# Patient Record
Sex: Male | Born: 1945 | Race: White | Hispanic: No | Smoking: Former smoker
Health system: Southern US, Community
[De-identification: ages and names within clinical notes are randomized; demographics above are authoritative.]

## PROBLEM LIST (undated history)

## (undated) DIAGNOSIS — I10 Essential (primary) hypertension: Secondary | ICD-10-CM

## (undated) DIAGNOSIS — E7800 Pure hypercholesterolemia, unspecified: Secondary | ICD-10-CM

## (undated) DIAGNOSIS — J449 Chronic obstructive pulmonary disease, unspecified: Secondary | ICD-10-CM

## (undated) HISTORY — DX: Chronic obstructive pulmonary disease, unspecified: J44.9

---

## 2005-01-08 HISTORY — PX: NASAL SINUS SURGERY: SHX719

## 2011-06-19 ENCOUNTER — Encounter: Payer: Self-pay | Admitting: Internal Medicine

## 2011-08-16 ENCOUNTER — Ambulatory Visit (AMBULATORY_SURGERY_CENTER): Payer: Medicare HMO | Admitting: *Deleted

## 2011-08-16 ENCOUNTER — Telehealth: Payer: Self-pay | Admitting: *Deleted

## 2011-08-16 VITALS — Ht 71.0 in | Wt 212.0 lb

## 2011-08-16 DIAGNOSIS — Z1211 Encounter for screening for malignant neoplasm of colon: Secondary | ICD-10-CM

## 2011-08-16 MED ORDER — MOVIPREP 100 G PO SOLR: ORAL | Status: DC

## 2011-08-16 NOTE — Telephone Encounter (Signed)
Pt had a colon with polyp 3 1/2 years ago at [facility].  Medical Release given to Dottie Smith CMA.  His colon is scheduled for 08/30/11. 

## 2011-08-16 NOTE — Telephone Encounter (Signed)
Noted  

## 2011-08-16 NOTE — Progress Notes (Signed)
Pt had a colon with polyp 3 1/2 years ago at [facility].  Medical Release given to Ronny Bacon CMA.  His colon is scheduled for 08/30/11.

## 2011-08-27 ENCOUNTER — Telehealth: Payer: Self-pay | Admitting: Internal Medicine

## 2011-08-27 NOTE — Telephone Encounter (Signed)
Advised patient that I have not yet received any records from [facility].

## 2011-08-30 ENCOUNTER — Encounter: Payer: Self-pay | Admitting: Internal Medicine

## 2011-08-30 ENCOUNTER — Ambulatory Visit (AMBULATORY_SURGERY_CENTER): Payer: Medicare HMO | Admitting: Internal Medicine

## 2011-08-30 VITALS — BP 141/65 | HR 76 | Temp 96.2°F | Resp 16 | Ht 71.0 in | Wt 212.0 lb

## 2011-08-30 DIAGNOSIS — Z860100 Personal history of colon polyps, unspecified: Secondary | ICD-10-CM

## 2011-08-30 DIAGNOSIS — Z1211 Encounter for screening for malignant neoplasm of colon: Secondary | ICD-10-CM

## 2011-08-30 MED ORDER — SODIUM CHLORIDE 0.9 % IV SOLN: 500.0000 mL | INTRAVENOUS | Status: DC

## 2011-08-30 NOTE — Op Note (Signed)
[facility] [address] [place], [address]   COLONOSCOPY PROCEDURE REPORT  PATIENT: [name], [name] Maziarz.  MR#: [id number] BIRTHDATE: April 18, 1945 , 65  yrs. old GENDER: Male ENDOSCOPIST: Hart Carwin, MD REFERRED BY:  [name], M.D. PROCEDURE DATE:  08/30/2011 PROCEDURE:   Colonoscopy, surveillance ASA CLASS:   Class II INDICATIONS:average risk patient for colon cancer and 2 polyps removed 2009. MEDICATIONS: MAC sedation, administered by CRNA and Propofol (Diprivan) 230 mg IV  DESCRIPTION OF PROCEDURE:   After the risks and benefits and of the procedure were explained, informed consent was obtained.  A digital rectal exam revealed no abnormalities of the rectum.    The LB [id number]  endoscope was introduced through the anus and advanced to the cecum, which was identified by both the appendix and ileocecal valve .  The quality of the prep was good, using MoviPrep .  The instrument was then slowly withdrawn as the colon was fully examined.     COLON FINDINGS: A normal appearing cecum, ileocecal valve, and appendiceal orifice were identified.  The ascending, hepatic flexure, transverse, splenic flexure, descending, sigmoid colon and rectum appeared unremarkable.  No polyps or cancers were seen. Retroflexed views revealed no abnormalities.     The scope was then withdrawn from the patient and the procedure completed.  COMPLICATIONS: There were no complications. ENDOSCOPIC IMPRESSION: Normal colon , no recurrent polyps  RECOMMENDATIONS: High fiber diet   REPEAT EXAM: In 10 year(s)  for Colonoscopy.  cc:  _______________________________ eSignedHart Carwin, MD 08/30/2011 9:54 AM

## 2011-08-30 NOTE — Progress Notes (Signed)
Patient did not experience any of the following events: a burn prior to discharge; a fall within the facility; wrong site/side/patient/procedure/implant event; or a hospital transfer or hospital admission upon discharge from the facility. (G8907) Patient did not have preoperative order for IV antibiotic SSI prophylaxis. (G8918)  

## 2011-08-30 NOTE — Patient Instructions (Addendum)
YOU HAD AN ENDOSCOPIC PROCEDURE TODAY AT THE [facility]: Refer to the procedure report that was given to you for any specific questions about what was found during the examination.  If the procedure report does not answer your questions, please call your gastroenterologist to clarify.  If you requested that your care partner not be given the details of your procedure findings, then the procedure report has been included in a sealed envelope for you to review at your convenience later.  YOU SHOULD EXPECT: Some feelings of bloating in the abdomen. Passage of more gas than usual.  Walking can help get rid of the air that was put into your GI tract during the procedure and reduce the bloating. If you had a lower endoscopy (such as a colonoscopy or flexible sigmoidoscopy) you may notice spotting of blood in your stool or on the toilet paper. If you underwent a bowel prep for your procedure, then you may not have a normal bowel movement for a few days.  DIET: Your first meal following the procedure should be a light meal and then it is ok to progress to your normal diet.  A half-sandwich or bowl of soup is an example of a good first meal.  Heavy or fried foods are harder to digest and may make you feel nauseous or bloated.  Likewise meals heavy in dairy and vegetables can cause extra gas to form and this can also increase the bloating.  Drink plenty of fluids but you should avoid alcoholic beverages for 24 hours.  ACTIVITY: Your care partner should take you home directly after the procedure.  You should plan to take it easy, moving slowly for the rest of the day.  You can resume normal activity the day after the procedure however you should NOT DRIVE or use heavy machinery for 24 hours (because of the sedation medicines used during the test).    SYMPTOMS TO REPORT IMMEDIATELY: A gastroenterologist can be reached at any hour.  During normal business hours, 8:30 AM to 5:00 PM Monday through Friday,  call [phone].  After hours and on weekends, please call the GI answering service at [phone] who will take a message and have the physician on call contact you.   Following lower endoscopy (colonoscopy or flexible sigmoidoscopy):  Excessive amounts of blood in the stool  Significant tenderness or worsening of abdominal pains  Swelling of the abdomen that is new, acute  Fever of 100F or higher  FOLLOW UP: Our staff will call the home number listed on your records the next business day following your procedure to check on you and address any questions or concerns that you may have at that time regarding the information given to you following your procedure. This is a courtesy call and so if there is no answer at the home number and we have not heard from you through the emergency physician on call, we will assume that you have returned to your regular daily activities without incident.  SIGNATURES/CONFIDENTIALITY: You and/or your care partner have signed paperwork which will be entered into your electronic medical record.  These signatures attest to the fact that that the information above on your After Visit Summary has been reviewed and is understood.  Full responsibility of the confidentiality of this discharge information lies with you and/or your care-partner.   Follow a high fiber diet- see handout  Follow up colonoscopy in 10 years

## 2011-08-31 ENCOUNTER — Telehealth: Payer: Self-pay

## 2011-08-31 NOTE — Telephone Encounter (Signed)
Unable to leave message number disconnected. 

## 2012-01-10 ENCOUNTER — Ambulatory Visit
Admission: RE | Admit: 2012-01-10 | Discharge: 2012-01-10 | Disposition: A | Discharge status: Home / Self Care | Payer: Medicare HMO | Source: Ambulatory Visit | Attending: Family Medicine | Admitting: Family Medicine

## 2012-01-10 ENCOUNTER — Other Ambulatory Visit: Payer: Self-pay | Admitting: Family Medicine

## 2012-01-10 DIAGNOSIS — J189 Pneumonia, unspecified organism: Secondary | ICD-10-CM

## 2012-09-15 ENCOUNTER — Other Ambulatory Visit: Payer: Self-pay | Admitting: Family Medicine

## 2012-09-15 DIAGNOSIS — R0989 Other specified symptoms and signs involving the circulatory and respiratory systems: Secondary | ICD-10-CM

## 2012-09-19 ENCOUNTER — Ambulatory Visit
Admission: RE | Admit: 2012-09-19 | Discharge: 2012-09-19 | Disposition: A | Discharge status: Home / Self Care | Payer: Medicare Other | Source: Ambulatory Visit | Attending: Family Medicine | Admitting: Family Medicine

## 2012-09-19 DIAGNOSIS — R0989 Other specified symptoms and signs involving the circulatory and respiratory systems: Secondary | ICD-10-CM

## 2012-10-07 ENCOUNTER — Ambulatory Visit (INDEPENDENT_AMBULATORY_CARE_PROVIDER_SITE_OTHER): Payer: Medicare Other | Admitting: Internal Medicine

## 2012-10-07 DIAGNOSIS — R0609 Other forms of dyspnea: Secondary | ICD-10-CM

## 2012-10-07 LAB — PULMONARY FUNCTION TEST

## 2012-10-07 NOTE — Progress Notes (Signed)
PFT done today. 

## 2012-12-17 ENCOUNTER — Other Ambulatory Visit (HOSPITAL_COMMUNITY): Payer: Self-pay | Admitting: Family Medicine

## 2012-12-17 ENCOUNTER — Ambulatory Visit (HOSPITAL_COMMUNITY): Payer: Medicare Other | Attending: Family Medicine | Admitting: Radiology

## 2012-12-17 ENCOUNTER — Encounter (INDEPENDENT_AMBULATORY_CARE_PROVIDER_SITE_OTHER): Payer: Self-pay

## 2012-12-17 ENCOUNTER — Other Ambulatory Visit (HOSPITAL_COMMUNITY): Payer: Self-pay | Admitting: Radiology

## 2012-12-17 DIAGNOSIS — J4489 Other specified chronic obstructive pulmonary disease: Secondary | ICD-10-CM | POA: Insufficient documentation

## 2012-12-17 DIAGNOSIS — J449 Chronic obstructive pulmonary disease, unspecified: Secondary | ICD-10-CM | POA: Insufficient documentation

## 2012-12-17 DIAGNOSIS — E669 Obesity, unspecified: Secondary | ICD-10-CM | POA: Insufficient documentation

## 2012-12-17 DIAGNOSIS — I059 Rheumatic mitral valve disease, unspecified: Secondary | ICD-10-CM | POA: Insufficient documentation

## 2012-12-17 DIAGNOSIS — Z87891 Personal history of nicotine dependence: Secondary | ICD-10-CM | POA: Insufficient documentation

## 2012-12-17 DIAGNOSIS — R0602 Shortness of breath: Secondary | ICD-10-CM

## 2012-12-17 DIAGNOSIS — R0989 Other specified symptoms and signs involving the circulatory and respiratory systems: Secondary | ICD-10-CM | POA: Insufficient documentation

## 2012-12-17 DIAGNOSIS — I359 Nonrheumatic aortic valve disorder, unspecified: Secondary | ICD-10-CM | POA: Insufficient documentation

## 2012-12-17 DIAGNOSIS — R0609 Other forms of dyspnea: Secondary | ICD-10-CM | POA: Insufficient documentation

## 2012-12-17 NOTE — Progress Notes (Signed)
Echocardiogram performed.  

## 2015-09-30 DIAGNOSIS — E7800 Pure hypercholesterolemia, unspecified: Secondary | ICD-10-CM | POA: Diagnosis not present

## 2015-09-30 DIAGNOSIS — Z0001 Encounter for general adult medical examination with abnormal findings: Secondary | ICD-10-CM | POA: Diagnosis not present

## 2015-09-30 DIAGNOSIS — J449 Chronic obstructive pulmonary disease, unspecified: Secondary | ICD-10-CM | POA: Diagnosis not present

## 2015-09-30 DIAGNOSIS — F17211 Nicotine dependence, cigarettes, in remission: Secondary | ICD-10-CM | POA: Diagnosis not present

## 2015-09-30 DIAGNOSIS — D229 Melanocytic nevi, unspecified: Secondary | ICD-10-CM | POA: Diagnosis not present

## 2015-09-30 DIAGNOSIS — Z125 Encounter for screening for malignant neoplasm of prostate: Secondary | ICD-10-CM | POA: Diagnosis not present

## 2015-09-30 DIAGNOSIS — Z79899 Other long term (current) drug therapy: Secondary | ICD-10-CM | POA: Diagnosis not present

## 2015-09-30 DIAGNOSIS — Z23 Encounter for immunization: Secondary | ICD-10-CM | POA: Diagnosis not present

## 2015-10-19 ENCOUNTER — Other Ambulatory Visit: Payer: Self-pay | Admitting: Acute Care

## 2015-10-19 DIAGNOSIS — Z87891 Personal history of nicotine dependence: Secondary | ICD-10-CM

## 2015-11-03 ENCOUNTER — Ambulatory Visit (INDEPENDENT_AMBULATORY_CARE_PROVIDER_SITE_OTHER): Payer: PPO | Admitting: Acute Care

## 2015-11-03 ENCOUNTER — Encounter: Payer: Self-pay | Admitting: Acute Care

## 2015-11-03 ENCOUNTER — Encounter (INDEPENDENT_AMBULATORY_CARE_PROVIDER_SITE_OTHER): Payer: Self-pay

## 2015-11-03 ENCOUNTER — Ambulatory Visit (INDEPENDENT_AMBULATORY_CARE_PROVIDER_SITE_OTHER)
Admission: RE | Admit: 2015-11-03 | Discharge: 2015-11-03 | Disposition: A | Discharge status: Home / Self Care | Payer: PPO | Source: Ambulatory Visit | Attending: Acute Care | Admitting: Acute Care

## 2015-11-03 DIAGNOSIS — Z87891 Personal history of nicotine dependence: Secondary | ICD-10-CM

## 2015-11-03 NOTE — Progress Notes (Signed)
Shared Decision Making Visit Lung Cancer Screening Program [phone])   Eligibility:  Age 70 y.o.  Pack Years Smoking History Calculation 80 pack year smoking history (# packs/per year x # years smoked)  Recent History of coughing up blood  no  Unexplained weight loss? no ( >Than 15 pounds within the last 6 months )  Prior History Lung / other cancer no (Diagnosis within the last 5 years already requiring surveillance chest CT Scans).  Smoking Status Former Smoker  Former Smokers: Years since quit: 7 years  Quit Date: 2010  Visit Components:  Discussion included one or more decision making aids. yes  Discussion included risk/benefits of screening. yes  Discussion included potential follow up diagnostic testing for abnormal scans. yes  Discussion included meaning and risk of over diagnosis. yes  Discussion included meaning and risk of False Positives. yes  Discussion included meaning of total radiation exposure. yes  Counseling Included:  Importance of adherence to annual lung cancer LDCT screening. yes  Impact of comorbidities on ability to participate in the program. yes  Ability and willingness to under diagnostic treatment. yes  Smoking Cessation Counseling:  Current Smokers:   Discussed importance of smoking cessation. No Former Smoker  Information about tobacco cessation classes and interventions provided to patient. no  Patient provided with "ticket" for LDCT Scan. yes  Symptomatic Patient. no  Counseling  Diagnosis Code: Tobacco Use Z72.0  Asymptomatic Patient yes  Counseling (Intermediate counseling: > three minutes counseling) [id number]  Former Smokers:   Discussed the importance of maintaining cigarette abstinence. yes  Diagnosis Code: Personal History of Nicotine Dependence. [id number]  Information about tobacco cessation classes and interventions provided to patient. Yes  Patient provided with "ticket" for LDCT Scan. yes  Written Order for  Lung Cancer Screening with LDCT placed in Epic. Yes (CT Chest Lung Cancer Screening Low Dose W/O CM) [id number] Z12.2-Screening of respiratory organs Z87.891-Personal history of nicotine dependence   I spent 20 minutes of face to face time with Mr. Haggan discussing the risks and benefits of lung cancer screening. We viewed a power point together that explained in detail the above noted topics. We took the time to pause the power point at intervals to allow for questions to be asked and answered to ensure understanding. We discussed that he had taken the single most powerful action possible to decrease his risk of developing lung cancer when he quit smoking. I counseled him to remain smoke free, and to contact me if he ever had the desire to smoke again so that I can provide resources and tools to help support the effort to remain smoke free. We discussed the time and location of the scan, and that either [place] or I will call with the results within  24-48 hours of receiving them. He has my card and contact information in the event he needs to speak with me, in addition to a copy of the power point we reviewed as a resource. He verbalized understanding of all of the above and had no further questions upon leaving the office.   Magdalen Spatz, NP 11/03/2015

## 2015-11-04 ENCOUNTER — Other Ambulatory Visit: Payer: Self-pay | Admitting: Acute Care

## 2015-11-04 ENCOUNTER — Telehealth: Payer: Self-pay | Admitting: Acute Care

## 2015-11-04 DIAGNOSIS — Z87891 Personal history of nicotine dependence: Secondary | ICD-10-CM

## 2015-11-04 NOTE — Telephone Encounter (Signed)
I have called Mr. [name] Peace with the results of his low-dose screening CT. I explained to him that his scan was read as a lung rads 3, probably benign findings with short-term follow-up in 6 months recommended. I explained to the patient that we would schedule his follow-up scan for late April 2018. He verbalized understanding of the above and had no further questions. He has my contact information in the event that he has further questions. I will fax a copy of these results to his primary care provider.

## 2015-12-13 ENCOUNTER — Encounter: Payer: Self-pay | Admitting: Acute Care

## 2015-12-15 DIAGNOSIS — D1800 Hemangioma unspecified site: Secondary | ICD-10-CM | POA: Diagnosis not present

## 2015-12-15 DIAGNOSIS — L814 Other melanin hyperpigmentation: Secondary | ICD-10-CM | POA: Diagnosis not present

## 2015-12-15 DIAGNOSIS — D225 Melanocytic nevi of trunk: Secondary | ICD-10-CM | POA: Diagnosis not present

## 2015-12-15 DIAGNOSIS — Z23 Encounter for immunization: Secondary | ICD-10-CM | POA: Diagnosis not present

## 2015-12-15 DIAGNOSIS — Z86018 Personal history of other benign neoplasm: Secondary | ICD-10-CM | POA: Diagnosis not present

## 2015-12-15 DIAGNOSIS — L821 Other seborrheic keratosis: Secondary | ICD-10-CM | POA: Diagnosis not present

## 2015-12-15 DIAGNOSIS — L219 Seborrheic dermatitis, unspecified: Secondary | ICD-10-CM | POA: Diagnosis not present

## 2015-12-15 DIAGNOSIS — L853 Xerosis cutis: Secondary | ICD-10-CM | POA: Diagnosis not present

## 2016-01-12 DIAGNOSIS — J069 Acute upper respiratory infection, unspecified: Secondary | ICD-10-CM | POA: Diagnosis not present

## 2016-01-12 DIAGNOSIS — J029 Acute pharyngitis, unspecified: Secondary | ICD-10-CM | POA: Diagnosis not present

## 2016-01-13 DIAGNOSIS — E7800 Pure hypercholesterolemia, unspecified: Secondary | ICD-10-CM | POA: Diagnosis not present

## 2016-03-30 DIAGNOSIS — J209 Acute bronchitis, unspecified: Secondary | ICD-10-CM | POA: Diagnosis not present

## 2016-03-30 DIAGNOSIS — J449 Chronic obstructive pulmonary disease, unspecified: Secondary | ICD-10-CM | POA: Diagnosis not present

## 2016-03-30 DIAGNOSIS — R030 Elevated blood-pressure reading, without diagnosis of hypertension: Secondary | ICD-10-CM | POA: Diagnosis not present

## 2016-05-04 ENCOUNTER — Ambulatory Visit (INDEPENDENT_AMBULATORY_CARE_PROVIDER_SITE_OTHER)
Admission: RE | Admit: 2016-05-04 | Discharge: 2016-05-04 | Disposition: A | Discharge status: Home / Self Care | Payer: PPO | Source: Ambulatory Visit | Attending: Acute Care | Admitting: Acute Care

## 2016-05-04 DIAGNOSIS — R911 Solitary pulmonary nodule: Secondary | ICD-10-CM | POA: Diagnosis not present

## 2016-05-04 DIAGNOSIS — J439 Emphysema, unspecified: Secondary | ICD-10-CM | POA: Diagnosis not present

## 2016-05-04 DIAGNOSIS — R918 Other nonspecific abnormal finding of lung field: Secondary | ICD-10-CM | POA: Diagnosis not present

## 2016-05-04 DIAGNOSIS — Z87891 Personal history of nicotine dependence: Secondary | ICD-10-CM

## 2016-05-07 ENCOUNTER — Other Ambulatory Visit: Payer: Self-pay | Admitting: Acute Care

## 2016-05-07 DIAGNOSIS — Z87891 Personal history of nicotine dependence: Secondary | ICD-10-CM

## 2016-10-05 DIAGNOSIS — E7800 Pure hypercholesterolemia, unspecified: Secondary | ICD-10-CM | POA: Diagnosis not present

## 2016-10-05 DIAGNOSIS — R040 Epistaxis: Secondary | ICD-10-CM | POA: Diagnosis not present

## 2016-10-05 DIAGNOSIS — Z79899 Other long term (current) drug therapy: Secondary | ICD-10-CM | POA: Diagnosis not present

## 2016-10-05 DIAGNOSIS — Z136 Encounter for screening for cardiovascular disorders: Secondary | ICD-10-CM | POA: Diagnosis not present

## 2016-10-05 DIAGNOSIS — J449 Chronic obstructive pulmonary disease, unspecified: Secondary | ICD-10-CM | POA: Diagnosis not present

## 2016-10-05 DIAGNOSIS — Z1159 Encounter for screening for other viral diseases: Secondary | ICD-10-CM | POA: Diagnosis not present

## 2016-10-05 DIAGNOSIS — Z23 Encounter for immunization: Secondary | ICD-10-CM | POA: Diagnosis not present

## 2016-10-05 DIAGNOSIS — Z0001 Encounter for general adult medical examination with abnormal findings: Secondary | ICD-10-CM | POA: Diagnosis not present

## 2016-12-25 DIAGNOSIS — S8264XA Nondisplaced fracture of lateral malleolus of right fibula, initial encounter for closed fracture: Secondary | ICD-10-CM | POA: Diagnosis not present

## 2016-12-25 DIAGNOSIS — M25471 Effusion, right ankle: Secondary | ICD-10-CM | POA: Diagnosis not present

## 2016-12-25 DIAGNOSIS — S82891A Other fracture of right lower leg, initial encounter for closed fracture: Secondary | ICD-10-CM | POA: Diagnosis not present

## 2016-12-25 DIAGNOSIS — M25571 Pain in right ankle and joints of right foot: Secondary | ICD-10-CM | POA: Diagnosis not present

## 2016-12-26 DIAGNOSIS — S8264XA Nondisplaced fracture of lateral malleolus of right fibula, initial encounter for closed fracture: Secondary | ICD-10-CM | POA: Diagnosis not present

## 2016-12-27 DIAGNOSIS — X58XXXA Exposure to other specified factors, initial encounter: Secondary | ICD-10-CM | POA: Diagnosis not present

## 2016-12-27 DIAGNOSIS — Y999 Unspecified external cause status: Secondary | ICD-10-CM | POA: Diagnosis not present

## 2016-12-27 DIAGNOSIS — G8918 Other acute postprocedural pain: Secondary | ICD-10-CM | POA: Diagnosis not present

## 2016-12-27 DIAGNOSIS — S8261XA Displaced fracture of lateral malleolus of right fibula, initial encounter for closed fracture: Secondary | ICD-10-CM | POA: Diagnosis not present

## 2016-12-28 DIAGNOSIS — T1490XA Injury, unspecified, initial encounter: Secondary | ICD-10-CM | POA: Diagnosis not present

## 2017-01-04 DIAGNOSIS — T1490XA Injury, unspecified, initial encounter: Secondary | ICD-10-CM | POA: Diagnosis not present

## 2017-01-07 DIAGNOSIS — S8261XD Displaced fracture of lateral malleolus of right fibula, subsequent encounter for closed fracture with routine healing: Secondary | ICD-10-CM | POA: Diagnosis not present

## 2017-01-09 DIAGNOSIS — T1490XA Injury, unspecified, initial encounter: Secondary | ICD-10-CM | POA: Diagnosis not present

## 2017-01-14 DIAGNOSIS — S8261XD Displaced fracture of lateral malleolus of right fibula, subsequent encounter for closed fracture with routine healing: Secondary | ICD-10-CM | POA: Diagnosis not present

## 2017-02-04 DIAGNOSIS — S8261XD Displaced fracture of lateral malleolus of right fibula, subsequent encounter for closed fracture with routine healing: Secondary | ICD-10-CM | POA: Diagnosis not present

## 2017-02-06 DIAGNOSIS — R262 Difficulty in walking, not elsewhere classified: Secondary | ICD-10-CM | POA: Diagnosis not present

## 2017-02-06 DIAGNOSIS — M25571 Pain in right ankle and joints of right foot: Secondary | ICD-10-CM | POA: Diagnosis not present

## 2017-02-06 DIAGNOSIS — M25671 Stiffness of right ankle, not elsewhere classified: Secondary | ICD-10-CM | POA: Diagnosis not present

## 2017-02-06 DIAGNOSIS — R531 Weakness: Secondary | ICD-10-CM | POA: Diagnosis not present

## 2017-02-12 DIAGNOSIS — R262 Difficulty in walking, not elsewhere classified: Secondary | ICD-10-CM | POA: Diagnosis not present

## 2017-02-12 DIAGNOSIS — M25671 Stiffness of right ankle, not elsewhere classified: Secondary | ICD-10-CM | POA: Diagnosis not present

## 2017-02-12 DIAGNOSIS — R531 Weakness: Secondary | ICD-10-CM | POA: Diagnosis not present

## 2017-02-12 DIAGNOSIS — M25571 Pain in right ankle and joints of right foot: Secondary | ICD-10-CM | POA: Diagnosis not present

## 2017-02-13 DIAGNOSIS — M25571 Pain in right ankle and joints of right foot: Secondary | ICD-10-CM | POA: Diagnosis not present

## 2017-02-13 DIAGNOSIS — R531 Weakness: Secondary | ICD-10-CM | POA: Diagnosis not present

## 2017-02-13 DIAGNOSIS — R262 Difficulty in walking, not elsewhere classified: Secondary | ICD-10-CM | POA: Diagnosis not present

## 2017-02-13 DIAGNOSIS — M25671 Stiffness of right ankle, not elsewhere classified: Secondary | ICD-10-CM | POA: Diagnosis not present

## 2017-02-18 DIAGNOSIS — M25671 Stiffness of right ankle, not elsewhere classified: Secondary | ICD-10-CM | POA: Diagnosis not present

## 2017-02-18 DIAGNOSIS — R262 Difficulty in walking, not elsewhere classified: Secondary | ICD-10-CM | POA: Diagnosis not present

## 2017-02-18 DIAGNOSIS — M25571 Pain in right ankle and joints of right foot: Secondary | ICD-10-CM | POA: Diagnosis not present

## 2017-02-18 DIAGNOSIS — R531 Weakness: Secondary | ICD-10-CM | POA: Diagnosis not present

## 2017-02-20 DIAGNOSIS — S8264XD Nondisplaced fracture of lateral malleolus of right fibula, subsequent encounter for closed fracture with routine healing: Secondary | ICD-10-CM | POA: Diagnosis not present

## 2017-02-20 DIAGNOSIS — M25571 Pain in right ankle and joints of right foot: Secondary | ICD-10-CM | POA: Diagnosis not present

## 2017-02-20 DIAGNOSIS — R262 Difficulty in walking, not elsewhere classified: Secondary | ICD-10-CM | POA: Diagnosis not present

## 2017-02-20 DIAGNOSIS — M25671 Stiffness of right ankle, not elsewhere classified: Secondary | ICD-10-CM | POA: Diagnosis not present

## 2017-02-25 DIAGNOSIS — M25671 Stiffness of right ankle, not elsewhere classified: Secondary | ICD-10-CM | POA: Diagnosis not present

## 2017-02-25 DIAGNOSIS — R262 Difficulty in walking, not elsewhere classified: Secondary | ICD-10-CM | POA: Diagnosis not present

## 2017-02-25 DIAGNOSIS — M25571 Pain in right ankle and joints of right foot: Secondary | ICD-10-CM | POA: Diagnosis not present

## 2017-02-25 DIAGNOSIS — R531 Weakness: Secondary | ICD-10-CM | POA: Diagnosis not present

## 2017-02-27 DIAGNOSIS — R262 Difficulty in walking, not elsewhere classified: Secondary | ICD-10-CM | POA: Diagnosis not present

## 2017-02-27 DIAGNOSIS — S8264XD Nondisplaced fracture of lateral malleolus of right fibula, subsequent encounter for closed fracture with routine healing: Secondary | ICD-10-CM | POA: Diagnosis not present

## 2017-02-27 DIAGNOSIS — M25571 Pain in right ankle and joints of right foot: Secondary | ICD-10-CM | POA: Diagnosis not present

## 2017-02-27 DIAGNOSIS — M25671 Stiffness of right ankle, not elsewhere classified: Secondary | ICD-10-CM | POA: Diagnosis not present

## 2017-03-06 DIAGNOSIS — M25571 Pain in right ankle and joints of right foot: Secondary | ICD-10-CM | POA: Diagnosis not present

## 2017-03-07 DIAGNOSIS — R531 Weakness: Secondary | ICD-10-CM | POA: Diagnosis not present

## 2017-03-07 DIAGNOSIS — M25671 Stiffness of right ankle, not elsewhere classified: Secondary | ICD-10-CM | POA: Diagnosis not present

## 2017-03-07 DIAGNOSIS — R262 Difficulty in walking, not elsewhere classified: Secondary | ICD-10-CM | POA: Diagnosis not present

## 2017-03-07 DIAGNOSIS — M25571 Pain in right ankle and joints of right foot: Secondary | ICD-10-CM | POA: Diagnosis not present

## 2017-03-13 DIAGNOSIS — M25671 Stiffness of right ankle, not elsewhere classified: Secondary | ICD-10-CM | POA: Diagnosis not present

## 2017-03-13 DIAGNOSIS — M25571 Pain in right ankle and joints of right foot: Secondary | ICD-10-CM | POA: Diagnosis not present

## 2017-03-13 DIAGNOSIS — R262 Difficulty in walking, not elsewhere classified: Secondary | ICD-10-CM | POA: Diagnosis not present

## 2017-03-13 DIAGNOSIS — S8264XD Nondisplaced fracture of lateral malleolus of right fibula, subsequent encounter for closed fracture with routine healing: Secondary | ICD-10-CM | POA: Diagnosis not present

## 2017-04-12 DIAGNOSIS — M25571 Pain in right ankle and joints of right foot: Secondary | ICD-10-CM | POA: Diagnosis not present

## 2017-05-06 ENCOUNTER — Ambulatory Visit (INDEPENDENT_AMBULATORY_CARE_PROVIDER_SITE_OTHER)
Admission: RE | Admit: 2017-05-06 | Discharge: 2017-05-06 | Disposition: A | Discharge status: Home / Self Care | Payer: PPO | Source: Ambulatory Visit | Attending: Acute Care | Admitting: Acute Care

## 2017-05-06 DIAGNOSIS — Z87891 Personal history of nicotine dependence: Secondary | ICD-10-CM | POA: Diagnosis not present

## 2017-05-17 ENCOUNTER — Other Ambulatory Visit: Payer: Self-pay | Admitting: Acute Care

## 2017-05-17 DIAGNOSIS — Z87891 Personal history of nicotine dependence: Secondary | ICD-10-CM

## 2017-05-17 DIAGNOSIS — Z122 Encounter for screening for malignant neoplasm of respiratory organs: Secondary | ICD-10-CM

## 2017-07-16 DIAGNOSIS — L570 Actinic keratosis: Secondary | ICD-10-CM | POA: Diagnosis not present

## 2017-07-16 DIAGNOSIS — D1800 Hemangioma unspecified site: Secondary | ICD-10-CM | POA: Diagnosis not present

## 2017-07-16 DIAGNOSIS — D225 Melanocytic nevi of trunk: Secondary | ICD-10-CM | POA: Diagnosis not present

## 2017-07-16 DIAGNOSIS — Z86018 Personal history of other benign neoplasm: Secondary | ICD-10-CM | POA: Diagnosis not present

## 2017-07-16 DIAGNOSIS — L814 Other melanin hyperpigmentation: Secondary | ICD-10-CM | POA: Diagnosis not present

## 2017-07-16 DIAGNOSIS — C44219 Basal cell carcinoma of skin of left ear and external auricular canal: Secondary | ICD-10-CM | POA: Diagnosis not present

## 2017-07-16 DIAGNOSIS — D485 Neoplasm of uncertain behavior of skin: Secondary | ICD-10-CM | POA: Diagnosis not present

## 2017-07-16 DIAGNOSIS — L821 Other seborrheic keratosis: Secondary | ICD-10-CM | POA: Diagnosis not present

## 2017-09-02 DIAGNOSIS — C44219 Basal cell carcinoma of skin of left ear and external auricular canal: Secondary | ICD-10-CM | POA: Diagnosis not present

## 2017-10-23 DIAGNOSIS — Z0001 Encounter for general adult medical examination with abnormal findings: Secondary | ICD-10-CM | POA: Diagnosis not present

## 2017-10-23 DIAGNOSIS — Z79899 Other long term (current) drug therapy: Secondary | ICD-10-CM | POA: Diagnosis not present

## 2017-10-23 DIAGNOSIS — E7800 Pure hypercholesterolemia, unspecified: Secondary | ICD-10-CM | POA: Diagnosis not present

## 2017-10-23 DIAGNOSIS — J449 Chronic obstructive pulmonary disease, unspecified: Secondary | ICD-10-CM | POA: Diagnosis not present

## 2017-10-23 DIAGNOSIS — Z23 Encounter for immunization: Secondary | ICD-10-CM | POA: Diagnosis not present

## 2017-10-23 DIAGNOSIS — Z1389 Encounter for screening for other disorder: Secondary | ICD-10-CM | POA: Diagnosis not present

## 2017-10-23 DIAGNOSIS — Z125 Encounter for screening for malignant neoplasm of prostate: Secondary | ICD-10-CM | POA: Diagnosis not present

## 2017-10-27 DIAGNOSIS — J309 Allergic rhinitis, unspecified: Secondary | ICD-10-CM | POA: Diagnosis not present

## 2017-11-08 DIAGNOSIS — J301 Allergic rhinitis due to pollen: Secondary | ICD-10-CM | POA: Diagnosis not present

## 2017-11-08 DIAGNOSIS — I10 Essential (primary) hypertension: Secondary | ICD-10-CM | POA: Diagnosis not present

## 2017-11-08 DIAGNOSIS — R0981 Nasal congestion: Secondary | ICD-10-CM | POA: Diagnosis not present

## 2018-01-27 DIAGNOSIS — M25571 Pain in right ankle and joints of right foot: Secondary | ICD-10-CM | POA: Diagnosis not present

## 2018-02-10 DIAGNOSIS — E7800 Pure hypercholesterolemia, unspecified: Secondary | ICD-10-CM | POA: Diagnosis not present

## 2018-02-10 DIAGNOSIS — M25671 Stiffness of right ankle, not elsewhere classified: Secondary | ICD-10-CM | POA: Diagnosis not present

## 2018-02-10 DIAGNOSIS — I10 Essential (primary) hypertension: Secondary | ICD-10-CM | POA: Diagnosis not present

## 2018-02-10 DIAGNOSIS — J449 Chronic obstructive pulmonary disease, unspecified: Secondary | ICD-10-CM | POA: Diagnosis not present

## 2018-02-10 DIAGNOSIS — Z79899 Other long term (current) drug therapy: Secondary | ICD-10-CM | POA: Diagnosis not present

## 2018-03-03 ENCOUNTER — Ambulatory Visit: Payer: PPO | Attending: Family Medicine | Admitting: Physical Therapy

## 2018-03-03 ENCOUNTER — Encounter: Payer: Self-pay | Admitting: Physical Therapy

## 2018-03-03 ENCOUNTER — Other Ambulatory Visit: Payer: Self-pay

## 2018-03-03 DIAGNOSIS — M6281 Muscle weakness (generalized): Secondary | ICD-10-CM | POA: Insufficient documentation

## 2018-03-03 DIAGNOSIS — M25671 Stiffness of right ankle, not elsewhere classified: Secondary | ICD-10-CM | POA: Diagnosis not present

## 2018-03-03 DIAGNOSIS — M25571 Pain in right ankle and joints of right foot: Secondary | ICD-10-CM | POA: Diagnosis not present

## 2018-03-03 NOTE — Therapy (Signed)
[facility] [address] [place], [place], [address] Phone: [phone]   Fax:  [phone]  Physical Therapy Evaluation  Patient Details  Name: [name] [name] MRN: [id number] Date of Birth: 03-19-1945 Referring Provider (PT): Lujean Amel, MD   Encounter Date: 03/03/2018  PT End of Session - 03/03/18 1521    Visit Number  1    Date for PT Re-Evaluation  04/28/18    PT Start Time  1749    PT Stop Time  1059    PT Time Calculation (min)  44 min    Activity Tolerance  Patient tolerated treatment well       Past Medical History:  Diagnosis Date  . COPD (chronic obstructive pulmonary disease) ([place])     Past Surgical History:  Procedure Laterality Date  . NASAL SINUS SURGERY  2007    There were no vitals filed for this visit.   Subjective Assessment - 03/03/18 1016    Subjective  Pt had PT after surgery for his Rt ankle.  It was 12/2016 ankle surgery due to fracture in lateral ankle.  I am still stiff and numb around the ankle.  I had x-ray in Jan 2020 again that shows everything is okay.  I am going to the gym and doing the bike and leg press with whole leg and then calf press on leg press machine.     Pertinent History  ankle surgery 12/2016    Limitations  Walking;Standing    How long can you walk comfortably?  about 1/2 mile, gets slow at the end    Diagnostic tests  x-ray    Patient Stated Goals  I want to know if there is anything I can do to make it better, improve ROM, walk more    Currently in Pain?  Yes    Pain Score  --   did not give number   Pain Location  Ankle    Pain Orientation  Right;Medial    Pain Descriptors / Indicators  Sore    Pain Type  Chronic pain    Pain Onset  More than a month ago    Pain Frequency  Intermittent    Aggravating Factors   at the end of the day and walking increased pain    Pain Relieving Factors  rest    Effect of Pain on Daily Activities  can't walk as  much as would like          [facility] PT Assessment - 03/03/18 0001      Assessment   Medical Diagnosis  M25.671 (ICD-10-CM) - Stiffness of right ankle, not elsewhere classified    Referring Provider (PT)  [name], [name], MD    Onset Date/Surgical Date  12/31/16    Prior Therapy  yes - therapy for ankle post surgery last year      Precautions   Precautions  None      Restrictions   Weight Bearing Restrictions  No      Balance Screen   Has the patient fallen in the past 6 months  No      [place] residence      Prior Function   Level of Independence  Independent      Cognition   Overall Cognitive Status  Within Functional Limits for tasks assessed      Observation/Other Assessments   Focus on Therapeutic Outcomes (FOTO)   55% limited  Observation/Other Assessments-Edema    Edema  Circumferential   around malleoli     Circumferential Edema   Circumferential - Right  11.5"    Circumferential - Left   11.25"      Posture/Postural Control   Posture/Postural Control  No significant limitations      ROM / Strength   AROM / PROM / Strength  AROM;Strength      AROM   AROM Assessment Site  Ankle    Right/Left Ankle  Right;Left    Right Ankle Dorsiflexion  5    Left Ankle Dorsiflexion  10      Strength   Overall Strength Comments  great toe extension 4/5 Rt; 5/5 Lt    Strength Assessment Site  Ankle    Right/Left Ankle  Right;Left    Right Ankle Dorsiflexion  4+/5    Right Ankle Plantar Flexion  5/5    Right Ankle Inversion  5/5    Right Ankle Eversion  5/5    Left Ankle Dorsiflexion  5/5    Left Ankle Plantar Flexion  5/5    Left Ankle Inversion  5/5    Left Ankle Eversion  5/5      Palpation   Palpation comment  talocrural hypomobile firm end feel, mild pitting edema Rt ankle      Ambulation/Gait   Gait Pattern  Decreased step length - left                Objective measurements completed on examination:  See above findings.              PT Education - 03/03/18 1059    Education Details   Access Code: [facility]     Person(s) Educated  Patient    Methods  Explanation;Demonstration;Verbal cues;Handout    Comprehension  Verbalized understanding;Returned demonstration       PT Short Term Goals - 03/03/18 1548      PT SHORT TERM GOAL #1   Title  ind with initial HEP    Time  4    Period  Weeks    Status  New    Target Date  03/31/18        PT Long Term Goals - 03/03/18 1537      PT LONG TERM GOAL #1   Title  ind with advanced HEP    Time  8    Period  Weeks    Status  New    Target Date  04/28/18      PT LONG TERM GOAL #2   Title  Pt will be able to walk for 30 minutes in order to get back to regular exercise routine    Time  8    Period  Weeks    Status  New    Target Date  04/28/18      PT LONG TERM GOAL #3   Title  Pt will demonstrate single leg stand for 10 seconds on Rt LE for improved stability for descending stairs    Time  8    Period  Weeks    Status  New    Target Date  04/28/18      PT LONG TERM GOAL #4   Title  Pt will report 25% less pain at the end of the typical day    Time  8    Period  Weeks    Status  New    Target Date  04/28/18  Plan - 03/03/18 1524    Clinical Impression Statement  Pt presents to clinic due to chronic pain and stiffness in Rt ankle.  He had surgery over one year ago and still reports numbness and stiffness as well as pain in the ankle at the end of the day.  Pt has increased swelling of Rt ankle.  He has impairments in circulation bilaterally and slightly more cool to the touch on Rt foot.  Pt demonstrates single leg stand on Rt LE 3 seconds and 10 sec on Lt.  Pt will benefit from skilled PT to address impairments in order to improve gait and function.    History and Personal Factors relevant to plan of care:  chronic pain, poor circulation in lower leg Rt>Lt    Clinical Presentation  Evolving     Clinical Presentation due to:  pt has had worsening pain over the last couple of months    Clinical Decision Making  Moderate    Rehab Potential  Good    PT Frequency  1x / week    PT Duration  8 weeks    PT Treatment/Interventions  ADLs/Self Care Home Management;Cryotherapy;Electrical Stimulation;Moist Heat;Vasopneumatic Device;Taping;Dry needling;Passive range of motion;Manual techniques;Patient/family education;Neuromuscular re-education;Therapeutic exercise;Therapeutic activities    PT Next Visit Plan  ankle ROM, STM and joint mobs, f/u on     PT Home Exercise Plan   Access Code: [facility]     Consulted and Agree with Plan of Care  Patient       Patient will benefit from skilled therapeutic intervention in order to improve the following deficits and impairments:  Pain, Increased fascial restricitons, Increased muscle spasms, Hypomobility, Difficulty walking, Decreased strength, Decreased range of motion  Visit Diagnosis: Stiffness of right ankle, not elsewhere classified - Plan: PT plan of care cert/re-cert  Pain in right ankle and joints of right foot - Plan: PT plan of care cert/re-cert  Muscle weakness (generalized) - Plan: PT plan of care cert/re-cert     Problem List There are no active problems to display for this patient.   Zannie Cove, PT 03/03/2018, 4:58 PM  [facility] [address], [place], [place], [address] Phone: [phone]   Fax:  [phone]  Name: JAMARIE MUSSA MRN: [id number] Date of Birth: 05/07/1945

## 2018-03-03 NOTE — Patient Instructions (Signed)
Access Code: [facility]  URL: [email or web address]  Date: 03/03/2018  Prepared by: Lovett Calender   Exercises  Gastroc Stretch on Wall - 3 reps - 1 sets - 30 sec hold - 1x daily - 7x weekly  Standing Ankle Dorsiflexion Stretch - 3 reps - 1 sets - 30 sec hold - 1x daily - 7x weekly  Single Leg Stance - 10 reps - 1 sets - work up to 10 sec hold - 1x daily - 7x weekly

## 2018-03-13 ENCOUNTER — Encounter: Payer: Self-pay | Admitting: Physical Therapy

## 2018-03-13 ENCOUNTER — Ambulatory Visit: Payer: PPO | Attending: Family Medicine | Admitting: Physical Therapy

## 2018-03-13 DIAGNOSIS — M25671 Stiffness of right ankle, not elsewhere classified: Secondary | ICD-10-CM

## 2018-03-13 DIAGNOSIS — M6281 Muscle weakness (generalized): Secondary | ICD-10-CM | POA: Diagnosis not present

## 2018-03-13 DIAGNOSIS — M25571 Pain in right ankle and joints of right foot: Secondary | ICD-10-CM | POA: Insufficient documentation

## 2018-03-13 NOTE — Therapy (Signed)
[facility] [address], [place], [place], [address] Phone: [phone]   Fax:  [phone]  Physical Therapy Treatment  Patient Details  Name: [name] [name] MRN: [id number] Date of Birth: [birth year] Referring Provider (PT): Lujean Amel, MD   Encounter Date: 03/13/2018  PT End of Session - 03/13/18 0843    Visit Number  2    Date for PT Re-Evaluation  04/28/18    PT Start Time  0843    PT Stop Time  0924    PT Time Calculation (min)  41 min    Activity Tolerance  Patient tolerated treatment well    Behavior During Therapy  [facility] for tasks assessed/performed       Past Medical History:  Diagnosis Date  . COPD (chronic obstructive pulmonary disease) ([place])     Past Surgical History:  Procedure Laterality Date  . NASAL SINUS SURGERY  2007    There were no vitals filed for this visit.  Subjective Assessment - 03/13/18 0852    Subjective  I don't feel like I can feel the bottom of my Rt foot as much as the left    Pertinent History  ankle surgery 12/2016    Currently in Pain?  No/denies                       [facility] Adult PT Treatment/Exercise - 03/13/18 0001      Self-Care   Self-Care  Other Self-Care Comments    Other Self-Care Comments   spikey ball rolling on the foot      Exercises   Exercises  Knee/Hip      Knee/Hip Exercises: Aerobic   Stationary Bike  L1 x 5 min   PT present for update     Knee/Hip Exercises: Standing   Side Lunges  Left;10 reps   slider   Step Down  Right;Hand Hold: 2;15 reps;Step Height: 6"   step onto black mat   Stairs  2x4 steps with cues to keep foot straight    SLS  2 x 30 sec Rt side      Manual Therapy   Manual Therapy  Soft tissue mobilization;Joint mobilization    Joint Mobilization  A/P talocrural mobs,     Soft tissue mobilization  peroneals and effleurage for reduced swelling      Ankle Exercises: Seated   BAPS  Level 2;Sitting;15  reps   side and fwd/back     Ankle Exercises: Standing   Other Standing Ankle Exercises  mob with movement - Rt foot on 8" step into DF with PT providing stab to tibia      calf stretch on slant board - 3 x 20 sec         PT Short Term Goals - 03/03/18 1548      PT SHORT TERM GOAL #1   Title  ind with initial HEP    Time  4    Period  Weeks    Status  New    Target Date  03/31/18        PT Long Term Goals - 03/03/18 1537      PT LONG TERM GOAL #1   Title  ind with advanced HEP    Time  8    Period  Weeks    Status  New    Target Date  04/28/18      PT LONG TERM GOAL #2   Title  Pt  will be able to walk for 30 minutes in order to get back to regular exercise routine    Time  8    Period  Weeks    Status  New    Target Date  04/28/18      PT LONG TERM GOAL #3   Title  Pt will demonstrate single leg stand for 10 seconds on Rt LE for improved stability for descending stairs    Time  8    Period  Weeks    Status  New    Target Date  04/28/18      PT LONG TERM GOAL #4   Title  Pt will report 25% less pain at the end of the typical day    Time  8    Period  Weeks    Status  New    Target Date  04/28/18            Plan - 03/13/18 0930    Clinical Impression Statement  Pt did well with treatment today. He felt stretches better than he can normally feel when he does them at home.  Pt with first treatment since eval and no goals met today.  He will continue to benefit from skilled PT to progress according to POC    PT Treatment/Interventions  ADLs/Self Care Home Management;Cryotherapy;Electrical Stimulation;Moist Heat;Vasopneumatic Device;Taping;Dry needling;Passive range of motion;Manual techniques;Patient/family education;Neuromuscular re-education;Therapeutic exercise;Therapeutic activities    PT Next Visit Plan  ankle ROM, STM and joint mobs, Le strength f/u on roller to bottom of foot    PT Home Exercise Plan   Access Code: [facility]     Consulted and  Agree with Plan of Care  Patient       Patient will benefit from skilled therapeutic intervention in order to improve the following deficits and impairments:  Pain, Increased fascial restricitons, Increased muscle spasms, Hypomobility, Difficulty walking, Decreased strength, Decreased range of motion  Visit Diagnosis: Stiffness of right ankle, not elsewhere classified  Pain in right ankle and joints of right foot  Muscle weakness (generalized)     Problem List There are no active problems to display for this patient.   Camillo Flaming Emauri Krygier, PT 03/13/2018, 9:32 AM  [facility] [address] [place], [place], [address] Phone: [phone]   Fax:  [phone]  Name: KYIAN OBST MRN: [id number] Date of Birth: April 02, 1945

## 2018-03-17 ENCOUNTER — Ambulatory Visit: Payer: PPO | Admitting: Physical Therapy

## 2018-03-17 DIAGNOSIS — M25671 Stiffness of right ankle, not elsewhere classified: Secondary | ICD-10-CM

## 2018-03-17 DIAGNOSIS — M6281 Muscle weakness (generalized): Secondary | ICD-10-CM

## 2018-03-17 DIAGNOSIS — M25571 Pain in right ankle and joints of right foot: Secondary | ICD-10-CM

## 2018-03-17 NOTE — Therapy (Addendum)
[facility] [address], [place], [place], [address] Phone: [phone]   Fax:  [phone]  Physical Therapy Treatment  Patient Details  Name: [name] [name] MRN: [id number] Date of Birth: Jun 23, 1945 Referring Provider (PT): Lujean Amel, MD   Encounter Date: 03/17/2018  PT End of Session - 03/17/18 1318    Visit Number  3    Date for PT Re-Evaluation  04/28/18    PT Start Time  1229    PT Stop Time  1312    PT Time Calculation (min)  43 min    Activity Tolerance  Patient tolerated treatment well    Behavior During Therapy  [facility] for tasks assessed/performed       Past Medical History:  Diagnosis Date  . COPD (chronic obstructive pulmonary disease) ([place])     Past Surgical History:  Procedure Laterality Date  . NASAL SINUS SURGERY  2007    There were no vitals filed for this visit.  Subjective Assessment - 03/17/18 1235    Subjective  Sometimes I feel looser but not every day.  I had 2 days when it woke me up because it was tingling so much.  I don't know why because that never happened before.    Patient Stated Goals  I want to know if there is anything I can do to make it better, improve ROM, walk more    Currently in Pain?  No/denies                       [facility] Adult PT Treatment/Exercise - 03/17/18 0001      Knee/Hip Exercises: Stretches   Gastroc Stretch  Right;3 reps;20 seconds    Other Knee/Hip Stretches  calf stretch on rocker board    Other Knee/Hip Stretches  ankle DF with pT assisted mobilization with movement Rt ankle on 8inch step - 10x      Knee/Hip Exercises: Aerobic   Stationary Bike  L1 x 6 min (3 min interval every 30 sec))   PT present for update     Knee/Hip Exercises: Standing   Lateral Step Up  Right;2 sets;10 reps;Step Height: 6"    Forward Step Up  Right;2 sets;10 reps;Step Height: 6";Hand Hold: 1    SLS  2 x 30 sec Rt side   blue pad   SLS with  Vectors  hip abduction 10x   blue pad     Manual Therapy   Joint Mobilization  A/P talocrural mobs    Soft tissue mobilization  effleurage for improved circulation               PT Short Term Goals - 03/03/18 1548      PT SHORT TERM GOAL #1   Title  ind with initial HEP    Time  4    Period  Weeks    Status  New    Target Date  03/31/18        PT Long Term Goals - 03/03/18 1537      PT LONG TERM GOAL #1   Title  ind with advanced HEP    Time  8    Period  Weeks    Status  New    Target Date  04/28/18      PT LONG TERM GOAL #2   Title  Pt will be able to walk for 30 minutes in order to get back to regular exercise routine  Time  8    Period  Weeks    Status  New    Target Date  04/28/18      PT LONG TERM GOAL #3   Title  Pt will demonstrate single leg stand for 10 seconds on Rt LE for improved stability for descending stairs    Time  8    Period  Weeks    Status  New    Target Date  04/28/18      PT LONG TERM GOAL #4   Title  Pt will report 25% less pain at the end of the typical day    Time  8    Period  Weeks    Status  New    Target Date  04/28/18            Plan - 03/17/18 1255    Clinical Impression Statement  Pt becomes out of breath easily with bike and doing step ups.  Pt was educated in pursed lip breathing to increase his lung capacity for improved ability to tolerate exercises.  Pt reports some improvement.  Will re-assess at next visit.      PT Treatment/Interventions  ADLs/Self Care Home Management;Cryotherapy;Electrical Stimulation;Moist Heat;Vasopneumatic Device;Taping;Dry needling;Passive range of motion;Manual techniques;Patient/family education;Neuromuscular re-education;Therapeutic exercise;Therapeutic activities    PT Next Visit Plan  re-assess ROM, f/u with breathing and additional exercises added to HEP    PT Home Exercise Plan   Access Code: [facility]     Consulted and Agree with Plan of Care  Patient       Patient will  benefit from skilled therapeutic intervention in order to improve the following deficits and impairments:  Pain, Increased fascial restricitons, Increased muscle spasms, Hypomobility, Difficulty walking, Decreased strength, Decreased range of motion  Visit Diagnosis: Stiffness of right ankle, not elsewhere classified  Pain in right ankle and joints of right foot  Muscle weakness (generalized)     Problem List There are no active problems to display for this patient.   Jule Ser, PT 03/17/2018, 1:24 PM  [facility] [address], [place], [place], [address] Phone: [phone]   Fax:  [phone]  Name: MILIK GILREATH MRN: [id number] Date of Birth: 05/17/1945  PHYSICAL THERAPY DISCHARGE SUMMARY  Visits from Start of Care: 3  Current functional level related to goals / functional outcomes: See above   Remaining deficits: See above   Education / Equipment: HEP Plan: Patient agrees to discharge.  Patient goals were not met. Patient is being discharged due to not returning since the last visit.  ?????    Patient is doing well at this time and wants to see how he feels in a couple of months due to clinic being closed, he will return if needed in a couple of months.   Gustavus Bryant, PT 04/18/18 1:53 PM

## 2018-03-18 ENCOUNTER — Encounter: Payer: Self-pay | Admitting: Pulmonary Disease

## 2018-03-18 ENCOUNTER — Ambulatory Visit: Payer: PPO | Admitting: Pulmonary Disease

## 2018-03-18 VITALS — BP 136/80 | HR 95 | Ht 71.0 in | Wt 232.0 lb

## 2018-03-18 DIAGNOSIS — J449 Chronic obstructive pulmonary disease, unspecified: Secondary | ICD-10-CM | POA: Insufficient documentation

## 2018-03-18 DIAGNOSIS — J441 Chronic obstructive pulmonary disease with (acute) exacerbation: Secondary | ICD-10-CM | POA: Insufficient documentation

## 2018-03-18 MED ORDER — UMECLIDINIUM-VILANTEROL 62.5-25 MCG/INH IN AEPB: 1.0000 | INHALATION_SPRAY | Freq: Every day | RESPIRATORY_TRACT | 0 refills | Status: DC

## 2018-03-18 NOTE — Addendum Note (Signed)
Addended by: Valerie Salts on: 03/18/2018 12:26 PM   Modules accepted: Orders

## 2018-03-18 NOTE — Progress Notes (Signed)
Subjective:    Patient ID: [name] [name], male    DOB: 11-Jun-1945, 73 y.o.   MRN: [id number]  HPI  Chief Complaint  Patient presents with  . Pulm Consult    Has a history of COPD. Was diagnosed with COPD 2018.       Past Medical History:  Diagnosis Date  . COPD (chronic obstructive pulmonary disease) ([place])    73 year old heavy ex-smoker presents to establish care for COPD. He smoked about a pack per day starting as a teenager until he quit in 2010.  He had quit for 5 years in between, about 40 pack years.  He was diagnosed with COPD in 2014 after breathing test.  He was initially tried on Advair but had low energy and started gaining weight and hence this was stopped and he was placed on Spiriva.  He is not sure whether this is really helped him. He reports decreased endurance since a right ankle fracture that he sustained after a fall in 12/2016.  His ankle felt stiff and swollen with decreased flexibility for quite a few months even after surgery and he wonders if this has caused his decrease in endurance.  He became sedentary.  Over the last 2 months he has embarked on an exercise program in the gym and is able to do about 15 minutes on the bicycle however he feels short of breath while walking up his driveway sometimes. He reports perennial allergies and when he has nasal obstruction this makes his breathing worse. He denies significant postnasal drip or reflux. He denies wheezing coughing or frequent chest colds.  I reviewed PFTs and CT chest, low-dose screening that he has had over the past few years which shows moderate emphysema and stable right-sided pulmonary nodules  Spirometry today shows a drop in lung function to 43%  Significant tests/ events reviewed  Low-dose CT chest 04/2017 RADS 2, benign right-sided pulmonary nodules, stable compared to prior CTs.  Moderate emphysema, aortic atherosclerosis.  PFTs 09/2012 moderate reversible airway obstruction, ratio 49,  FEV1 44%, improved to 56%/25% bronchodilator response, TLC 88%, DLCO 55%  Spirometry 03/18/2018 showed ratio of 61, FEV1 of 43% and FVC of 52%   Past Surgical History:  Procedure Laterality Date  . NASAL SINUS SURGERY  2007    No Known Allergies  Social History   Socioeconomic History  . Marital status: Married    Spouse name: Not on file  . Number of children: Not on file  . Years of education: Not on file  . Highest education level: Not on file  Occupational History  . Not on file  Social Needs  . Financial resource strain: Not on file  . Food insecurity:    Worry: Not on file    Inability: Not on file  . Transportation needs:    Medical: Not on file    Non-medical: Not on file  Tobacco Use  . Smoking status: Former Smoker    Packs/day: 2.00    Years: 40.00    Pack years: 80.00    Types: Cigarettes    Last attempt to quit: 2010    Years since quitting: 10.1  . Smokeless tobacco: Never Used  Substance and Sexual Activity  . Alcohol use: Yes    Alcohol/week: 12.0 standard drinks    Types: 12 Cans of beer per week  . Drug use: No  . Sexual activity: Not on file  Lifestyle  . Physical activity:    Days per week: Not on  file    Minutes per session: Not on file  . Stress: Not on file  Relationships  . Social connections:    Talks on phone: Not on file    Gets together: Not on file    Attends religious service: Not on file    Active member of club or organization: Not on file    Attends meetings of clubs or organizations: Not on file    Relationship status: Not on file  . Intimate partner violence:    Fear of current or ex partner: Not on file    Emotionally abused: Not on file    Physically abused: Not on file    Forced sexual activity: Not on file  Other Topics Concern  . Not on file  Social History Narrative  . Not on file    Family History  Problem Relation Age of Onset  . Esophageal cancer Father     Social History   Socioeconomic History  .  Marital status: Married    Spouse name: Not on file  . Number of children: Not on file  . Years of education: Not on file  . Highest education level: Not on file  Occupational History  . Not on file  Social Needs  . Financial resource strain: Not on file  . Food insecurity:    Worry: Not on file    Inability: Not on file  . Transportation needs:    Medical: Not on file    Non-medical: Not on file  Tobacco Use  . Smoking status: Former Smoker    Packs/day: 2.00    Years: 40.00    Pack years: 80.00    Types: Cigarettes    Last attempt to quit: 2010    Years since quitting: 10.1  . Smokeless tobacco: Never Used  Substance and Sexual Activity  . Alcohol use: Yes    Alcohol/week: 12.0 standard drinks    Types: 12 Cans of beer per week  . Drug use: No  . Sexual activity: Not on file  Lifestyle  . Physical activity:    Days per week: Not on file    Minutes per session: Not on file  . Stress: Not on file  Relationships  . Social connections:    Talks on phone: Not on file    Gets together: Not on file    Attends religious service: Not on file    Active member of club or organization: Not on file    Attends meetings of clubs or organizations: Not on file    Relationship status: Not on file  . Intimate partner violence:    Fear of current or ex partner: Not on file    Emotionally abused: Not on file    Physically abused: Not on file    Forced sexual activity: Not on file  Other Topics Concern  . Not on file  Social History Narrative  . Not on file    Family History  Problem Relation Age of Onset  . Esophageal cancer Father     Review of Systems  Constitutional: Negative for fever and unexpected weight change.  HENT: Positive for congestion and sneezing. Negative for dental problem, ear pain, nosebleeds, postnasal drip, rhinorrhea, sinus pressure, sore throat and trouble swallowing.   Eyes: Negative for redness and itching.  Respiratory: Positive for shortness of  breath. Negative for cough, chest tightness and wheezing.   Cardiovascular: Negative for palpitations and leg swelling.  Gastrointestinal: Negative for nausea and vomiting.  Genitourinary:  Negative for dysuria.  Musculoskeletal: Positive for joint swelling.  Skin: Negative for rash.  Allergic/Immunologic: Negative.  Negative for environmental allergies, food allergies and immunocompromised state.  Neurological: Negative for headaches.  Hematological: Does not bruise/bleed easily.  Psychiatric/Behavioral: Negative for dysphoric mood. The patient is not nervous/anxious.        Objective:   Physical Exam  Gen. Pleasant, obese, in no distress, normal affect ENT - no pallor,icterus, no post nasal drip, class 2-3 airway Neck: No JVD, no thyromegaly, no carotid bruits Lungs: no use of accessory muscles, no dullness to percussion, decreased without rales or rhonchi  Cardiovascular: Rhythm regular, heart sounds  normal, no murmurs or gallops, no peripheral edema Abdomen: soft and non-tender, no hepatosplenomegaly, BS normal. Musculoskeletal: No deformities, no cyanosis or clubbing Neuro:  alert, non focal, no tremors       Assessment & Plan:

## 2018-03-18 NOTE — Patient Instructions (Signed)
Lung function is decreased slightly to 43%. Trial of Anoro 1 puff daily instead of Spiriva, call back for prescription if this works. Referral to pulmonary rehab program

## 2018-03-18 NOTE — Assessment & Plan Note (Signed)
Lung function is decreased slightly to 43%. Trial of Anoro 1 puff daily instead of Spiriva, call back for prescription if this works. Can consider trilogy if this does not improve his symptoms Referral to pulmonary rehab program -it is also possible that he has significant deconditioning since his ankle surgery and hopefully pulmonary rehab will reverse this  We discussed course of COPD after smoking cessation

## 2018-03-20 ENCOUNTER — Telehealth: Payer: Self-pay

## 2018-03-20 MED ORDER — ALBUTEROL SULFATE HFA 108 (90 BASE) MCG/ACT IN AERS: 2.0000 | INHALATION_SPRAY | Freq: Four times a day (QID) | RESPIRATORY_TRACT | 2 refills | Status: DC | PRN

## 2018-03-20 NOTE — Telephone Encounter (Signed)
Call back # [phone]  Patient called stating he was told during his recent visit he would be given a rescue inhaler but he called the pharmacy this morning and there is not anything there. He also states he was told he was being given a month supply of anoro to try but once he got home he was given a 14 day supply. He wants to know if this will be enough for him to know whether it is making a difference in his breathing.   Pharmacy: [facility] on [address].

## 2018-03-20 NOTE — Telephone Encounter (Signed)
Spoke with pt and advised that rx for Albuterol inh was sent to pharmacy per Dr [name].  Pt verbalized understanding.  Nothing further needed at this time.

## 2018-03-20 NOTE — Telephone Encounter (Signed)
Called and spoke with Patient.  Patient stated that he was told that Dr [name] was going to send a prescription for a rescue inhaler at 03/18/18 OV.  Patient sated that his pharmacy has not received any new prescriptions.  Patient also wanted 1 more Anoro sample.  Anoro sample only last  2 weeks and he stated that he was told to try it for 1 month.  Anoro sample placed at front desk for patient to pick up.    Dr [name], please advise on rescue inhaler  Instructions      Return in about 3 months (around 06/18/2018) for BM/ me.  Lung function is decreased slightly to 43%. Trial of Anoro 1 puff daily instead of Spiriva, call back for prescription if this works. Referral to pulmonary rehab program

## 2018-03-20 NOTE — Telephone Encounter (Signed)
Okay to send albuterol MDI 2 puffs every 6 hours as needed for shortness of breath or wheezing

## 2018-03-24 ENCOUNTER — Ambulatory Visit: Payer: PPO | Admitting: Physical Therapy

## 2018-03-28 ENCOUNTER — Telehealth: Payer: Self-pay | Admitting: Pulmonary Disease

## 2018-03-28 NOTE — Telephone Encounter (Signed)
Primary Pulmonologist: [name] Last office visit and with whom: 03/18/2018 with [name] What do we see them for (pulmonary problems): COPD Last OV assessment/plan:  Assessment & Plan Note by Rigoberto Noel, MD at 03/18/2018 11:20 AM  Author: Rigoberto Noel, MD Author Type: Physician Filed: 03/18/2018 11:29 AM  Note Status: Written Cosign: Cosign Not Required Encounter Date: 03/18/2018  Problem: COPD with chronic bronchitis and emphysema ([place])  Editor: Rigoberto Noel, MD (Physician)    Lung function is decreased slightly to 43%. Trial of Anoro 1 puff daily instead of Spiriva, call back for prescription if this works. Can consider trilogy if this does not improve his symptoms Referral to pulmonary rehab program -it is also possible that he has significant deconditioning since his ankle surgery and hopefully pulmonary rehab will reverse this   We discussed course of COPD after smoking cessation     Patient Instructions by Rigoberto Noel, MD at 03/18/2018 10:15 AM  Author: Rigoberto Noel, MD Author Type: Physician Filed: 03/18/2018 11:19 AM  Note Status: Signed Cosign: Cosign Not Required Encounter Date: 03/18/2018  Editor: Rigoberto Noel, MD (Physician)    Lung function is decreased slightly to 43%. Trial of Anoro 1 puff daily instead of Spiriva, call back for prescription if this works. Referral to pulmonary rehab program    Instructions    Was appointment offered to patient (explain)?  No- wants recs over the phone   Reason for call: Pt has noticed some swelling in both ankles for the past 2 days. He states that his right ankle is usually is little swollen due to surgery he had over a year ago, but this has been worse and now his left one is slightly swollen too. He denies any CP or increased SOB. He wonders if the Anoro that he was started on could be causing this. He is asking if he should continue med. He denies starting on any other new meds since his last visit. Please advise, thanks!

## 2018-03-28 NOTE — Telephone Encounter (Signed)
If he is concerned he can go back to the Spiriva. Please elevate legs. Low sodium diet.

## 2018-03-28 NOTE — Telephone Encounter (Signed)
Spoke with the pt and notified of recs per [place]  She verbalized understanding  Nothing further needed

## 2018-04-02 ENCOUNTER — Ambulatory Visit: Payer: PPO | Admitting: Physical Therapy

## 2018-04-02 DIAGNOSIS — M7989 Other specified soft tissue disorders: Secondary | ICD-10-CM | POA: Diagnosis not present

## 2018-04-02 DIAGNOSIS — I10 Essential (primary) hypertension: Secondary | ICD-10-CM | POA: Diagnosis not present

## 2018-04-02 DIAGNOSIS — J449 Chronic obstructive pulmonary disease, unspecified: Secondary | ICD-10-CM | POA: Diagnosis not present

## 2018-04-09 DIAGNOSIS — M545 Low back pain: Secondary | ICD-10-CM | POA: Diagnosis not present

## 2018-04-09 DIAGNOSIS — M25473 Effusion, unspecified ankle: Secondary | ICD-10-CM | POA: Diagnosis not present

## 2018-04-09 DIAGNOSIS — I10 Essential (primary) hypertension: Secondary | ICD-10-CM | POA: Diagnosis not present

## 2018-04-15 DIAGNOSIS — Z20828 Contact with and (suspected) exposure to other viral communicable diseases: Secondary | ICD-10-CM | POA: Diagnosis not present

## 2018-04-15 DIAGNOSIS — I10 Essential (primary) hypertension: Secondary | ICD-10-CM | POA: Diagnosis not present

## 2018-05-29 ENCOUNTER — Telehealth: Payer: Self-pay | Admitting: Acute Care

## 2018-06-03 NOTE — Telephone Encounter (Signed)
[name] [name], can you call this pt to schedule his yearly low dose chest ct?

## 2018-06-03 NOTE — Telephone Encounter (Signed)
Erline Levine with [facility] has this patient scheduled for his LCS CT on 07/02/2018 @ 1:00pm. I have called and left the patient VM to call about the appt time. I have also mailed appt reminder to the patient about his appt and location

## 2018-06-18 ENCOUNTER — Ambulatory Visit: Payer: PPO | Admitting: Pulmonary Disease

## 2018-07-01 ENCOUNTER — Telehealth: Payer: Self-pay | Admitting: *Deleted

## 2018-07-01 NOTE — Telephone Encounter (Signed)

## 2018-07-02 ENCOUNTER — Ambulatory Visit (INDEPENDENT_AMBULATORY_CARE_PROVIDER_SITE_OTHER)
Admission: RE | Admit: 2018-07-02 | Discharge: 2018-07-02 | Disposition: A | Discharge status: Home / Self Care | Payer: PPO | Source: Ambulatory Visit | Attending: Acute Care | Admitting: Acute Care

## 2018-07-02 ENCOUNTER — Other Ambulatory Visit: Payer: Self-pay

## 2018-07-02 DIAGNOSIS — Z87891 Personal history of nicotine dependence: Secondary | ICD-10-CM

## 2018-07-02 DIAGNOSIS — Z122 Encounter for screening for malignant neoplasm of respiratory organs: Secondary | ICD-10-CM

## 2018-07-08 ENCOUNTER — Other Ambulatory Visit: Payer: Self-pay | Admitting: *Deleted

## 2018-07-08 DIAGNOSIS — Z87891 Personal history of nicotine dependence: Secondary | ICD-10-CM

## 2018-07-08 DIAGNOSIS — Z122 Encounter for screening for malignant neoplasm of respiratory organs: Secondary | ICD-10-CM

## 2018-07-22 ENCOUNTER — Telehealth: Payer: Self-pay | Admitting: Pulmonary Disease

## 2018-07-22 NOTE — Telephone Encounter (Signed)
There was not any notation of nipple or breast abnormality. Please have the patient follow up with PCP about breast pain. Thanks so much.

## 2018-07-22 NOTE — Telephone Encounter (Signed)
Returned call to patient. He c/o pain at left nipple soreness to the touch only x 1 month. He was questioning if any was found on 6/24  CT scan. He has did have a f/u appt but want to double check and get recommendations.   Notes recorded by Magdalen Spatz, NP on 07/04/2018 at 3:14 PM EDT  Please call patient and let them know their low dose Ct was read as a Lung RADS 2: nodules that are benign in appearance and behavior with a very low likelihood of becoming a clinically active cancer due to size or lack of growth. Recommendation per radiology is for a repeat LDCT in 12 months..Please let them know we will order and schedule their annual screening scan for 06/2019. 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 Please remind the patient that this is a non-gated exam therefore degree or severity of disease cannot be determined. Please have them follow up with their PCP regarding potential risk factor modification, dietary therapy or pharmacologic therapy if clinically indicated.  Pt. is currently on statin therapy. Please place order for annual screening scan for 6/20201 and fax results to PCP. Thanks so much.

## 2018-07-22 NOTE — Telephone Encounter (Signed)
Called and spoke with pt letting him know the info stated by SG and pt verbalized understanding and said he would contact PCP in regards to the nipple/breast pain. Nothing further needed.

## 2018-07-23 DIAGNOSIS — N644 Mastodynia: Secondary | ICD-10-CM | POA: Diagnosis not present

## 2018-07-23 DIAGNOSIS — L821 Other seborrheic keratosis: Secondary | ICD-10-CM | POA: Diagnosis not present

## 2018-07-23 DIAGNOSIS — D225 Melanocytic nevi of trunk: Secondary | ICD-10-CM | POA: Diagnosis not present

## 2018-07-23 DIAGNOSIS — L309 Dermatitis, unspecified: Secondary | ICD-10-CM | POA: Diagnosis not present

## 2018-07-23 DIAGNOSIS — L814 Other melanin hyperpigmentation: Secondary | ICD-10-CM | POA: Diagnosis not present

## 2018-07-23 DIAGNOSIS — L570 Actinic keratosis: Secondary | ICD-10-CM | POA: Diagnosis not present

## 2018-07-23 DIAGNOSIS — Z85828 Personal history of other malignant neoplasm of skin: Secondary | ICD-10-CM | POA: Diagnosis not present

## 2018-07-23 DIAGNOSIS — Z86018 Personal history of other benign neoplasm: Secondary | ICD-10-CM | POA: Diagnosis not present

## 2018-10-21 ENCOUNTER — Other Ambulatory Visit: Payer: Self-pay

## 2018-10-21 DIAGNOSIS — Z20822 Contact with and (suspected) exposure to covid-19: Secondary | ICD-10-CM

## 2018-10-21 DIAGNOSIS — Z20828 Contact with and (suspected) exposure to other viral communicable diseases: Secondary | ICD-10-CM | POA: Diagnosis not present

## 2018-10-22 LAB — NOVEL CORONAVIRUS, NAA: SARS-CoV-2, NAA: NOT DETECTED

## 2018-11-05 DIAGNOSIS — Z125 Encounter for screening for malignant neoplasm of prostate: Secondary | ICD-10-CM | POA: Diagnosis not present

## 2018-11-05 DIAGNOSIS — F419 Anxiety disorder, unspecified: Secondary | ICD-10-CM | POA: Diagnosis not present

## 2018-11-05 DIAGNOSIS — I10 Essential (primary) hypertension: Secondary | ICD-10-CM | POA: Diagnosis not present

## 2018-11-05 DIAGNOSIS — E7800 Pure hypercholesterolemia, unspecified: Secondary | ICD-10-CM | POA: Diagnosis not present

## 2018-11-05 DIAGNOSIS — R200 Anesthesia of skin: Secondary | ICD-10-CM | POA: Diagnosis not present

## 2018-11-05 DIAGNOSIS — Z79899 Other long term (current) drug therapy: Secondary | ICD-10-CM | POA: Diagnosis not present

## 2018-11-05 DIAGNOSIS — J449 Chronic obstructive pulmonary disease, unspecified: Secondary | ICD-10-CM | POA: Diagnosis not present

## 2018-11-05 DIAGNOSIS — Z23 Encounter for immunization: Secondary | ICD-10-CM | POA: Diagnosis not present

## 2018-11-05 DIAGNOSIS — Z0001 Encounter for general adult medical examination with abnormal findings: Secondary | ICD-10-CM | POA: Diagnosis not present

## 2018-11-06 ENCOUNTER — Encounter: Payer: Self-pay | Admitting: Neurology

## 2018-12-08 ENCOUNTER — Other Ambulatory Visit: Payer: Self-pay

## 2018-12-08 ENCOUNTER — Encounter: Payer: Self-pay | Admitting: Neurology

## 2018-12-08 ENCOUNTER — Ambulatory Visit (INDEPENDENT_AMBULATORY_CARE_PROVIDER_SITE_OTHER): Payer: PPO | Admitting: Neurology

## 2018-12-08 VITALS — BP 130/80 | HR 80 | Ht 71.0 in | Wt 226.0 lb

## 2018-12-08 DIAGNOSIS — Z7289 Other problems related to lifestyle: Secondary | ICD-10-CM | POA: Diagnosis not present

## 2018-12-08 DIAGNOSIS — Z789 Other specified health status: Secondary | ICD-10-CM

## 2018-12-08 DIAGNOSIS — R200 Anesthesia of skin: Secondary | ICD-10-CM

## 2018-12-08 DIAGNOSIS — M79671 Pain in right foot: Secondary | ICD-10-CM

## 2018-12-08 DIAGNOSIS — G629 Polyneuropathy, unspecified: Secondary | ICD-10-CM | POA: Diagnosis not present

## 2018-12-08 NOTE — Patient Instructions (Signed)

## 2018-12-08 NOTE — Progress Notes (Signed)
[facility] Clinic Note - Initial Visit   Date: 12/08/18  [name] [name] MRN: [id number] DOB: 17-May-1945   Dear Dr. [name]:  Thank you for your kind referral of [name] [name] for consultation of neuropathy. Although his history is well known to you, please allow [place] to reiterate it for the purpose of our medical record. The patient was accompanied to the clinic by self.  History of Present Illness: [name] [name] is a 73 y.o. right-handed male with hypertension, hyperlipidemia, COPD, and anxiety  presenting for evaluation of numbness of the feet.   He reports having numbness in both feet for several years.  Two years ago, he fractured his right ankle which required surgery.  Since his surgery, his right foot has never felt the same.  He reports swelling, tight sensation, throbbing pain, and reduced range of motion.  He feels that the foot is more numb as compared to the left.  He endorses imbalance, walks unassisted, and has not suffered any falls.  Since starting lasix, his right ankle swelling as markedly improved.  He is frustrated by his right foot symptoms.   He is not diabetic.  He endorses drinking 2-4 beers most nightly of the week. No family history of neuropathy   Out-side paper records, electronic medical record, and images have been reviewed where available and summarized as:  Labs 11/06/2018:  B12 314, TSH 1.94    Past Medical History:  Diagnosis Date  . COPD (chronic obstructive pulmonary disease) ([place])     Past Surgical History:  Procedure Laterality Date  . NASAL SINUS SURGERY  2007     Medications:  Outpatient Encounter Medications as of 12/08/2018  Medication Sig  . albuterol (PROVENTIL HFA;VENTOLIN HFA) 108 (90 Base) MCG/ACT inhaler Inhale 2 puffs into the lungs every 6 (six) hours as needed for wheezing or shortness of breath.  Marland Kitchen amLODipine (NORVASC) 2.5 MG tablet Take 2.5 mg by mouth daily.  Marland Kitchen atorvastatin  (LIPITOR) 10 MG tablet Take 10 mg by mouth daily.  . furosemide (LASIX) 20 MG tablet Take 20 mg by mouth daily.  Marland Kitchen ibuprofen (ADVIL,MOTRIN) 200 MG tablet Take 400 mg by mouth every 6 (six) hours as needed.  . loratadine (CLARITIN) 10 MG tablet Take 10 mg by mouth daily as needed for allergies.  Marland Kitchen tiotropium (SPIRIVA) 18 MCG inhalation capsule Place 18 mcg into inhaler and inhale daily.  Marland Kitchen umeclidinium-vilanterol (ANORO ELLIPTA) 62.5-25 MCG/INH AEPB Inhale 1 puff into the lungs daily.  Marland Kitchen amlodipine-atorvastatin (CADUET) 10-10 MG tablet Take 1 tablet by mouth daily.   No facility-administered encounter medications on file as of 12/08/2018.     Allergies: No Known Allergies  Family History: Family History  Problem Relation Age of Onset  . Esophageal cancer Father     Social History: Social History   Tobacco Use  . Smoking status: Former Smoker    Packs/day: 2.00    Years: 40.00    Pack years: 80.00    Types: Cigarettes    Quit date: 2010    Years since quitting: 10.9  . Smokeless tobacco: Never Used  Substance Use Topics  . Alcohol use: Yes    Alcohol/week: 12.0 standard drinks    Types: 12 Cans of beer per week  . Drug use: No   Social History   Social History Narrative   Right handed   One story home    Review of Systems:  CONSTITUTIONAL: No fevers, chills, night sweats, or weight loss.  EYES: No visual changes or eye pain ENT: No hearing changes.  No history of nose bleeds.   RESPIRATORY: No cough, wheezing and shortness of breath.   CARDIOVASCULAR: Negative for chest pain, and palpitations.   GI: Negative for abdominal discomfort, blood in stools or black stools.  No recent change in bowel habits.   GU:  No history of incontinence.   MUSCLOSKELETAL: N+o history of joint pain or swelling.  No myalgias.   SKIN: Negative for lesions, rash, and itching.   HEMATOLOGY/ONCOLOGY: Negative for prolonged bleeding, bruising easily, and swollen nodes.  No history of  cancer.   ENDOCRINE: Negative for cold or heat intolerance, polydipsia or goiter.   PSYCH:  No depression or anxiety symptoms.   NEURO: As Above.   Vital Signs:  BP 130/80   Pulse 80   Ht 5\' 11"  (1.803 m)   Wt 226 lb (102.5 kg)   SpO2 95%   BMI 31.52 kg/m    General Medical Exam:   General:  Well appearing, comfortable.   Eyes/ENT: see cranial nerve examination.   Neck:   No carotid bruits. Respiratory:  Clear to auscultation, good air entry bilaterally.   Cardiac:  Regular rate and rhythm, no murmur.   Extremities:  Right foot extension, dorsiflexion is mildly reduced.  No deformities, edema, or skin discoloration.  Skin:  No rashes or lesions.  Neurological Exam: MENTAL STATUS including orientation to time, place, person, recent and remote memory, attention span and concentration, language, and fund of knowledge is normal.  Speech is not dysarthric.  CRANIAL NERVES: II:  No visual field defects.   III-IV-VI: Pupils equal round and reactive to light.  Normal conjugate, extra-ocular eye movements in all directions of gaze.  No nystagmus.  No ptosis.   V:  Normal facial sensation.    VII:  Normal facial symmetry and movements.   VIII:  Normal hearing and vestibular function.   IX-X:  Normal palatal movement.   XI:  Normal shoulder shrug and head rotation.   XII:  Normal tongue strength and range of motion, no deviation or fasciculation.  MOTOR:  No atrophy, fasciculations or abnormal movements.  No pronator drift.   Upper Extremity:  Right  Left  Deltoid  5/5   5/5   Biceps  5/5   5/5   Triceps  5/5   5/5   Infraspinatus 5/5  5/5  Medial pectoralis 5/5  5/5  Wrist extensors  5/5   5/5   Wrist flexors  5/5   5/5   Finger extensors  5/5   5/5   Finger flexors  5/5   5/5   Dorsal interossei  5/5   5/5   Abductor pollicis  5/5   5/5   Tone (Ashworth scale)  0  0   Lower Extremity:  Right  Left  Hip flexors  5/5   5/5   Hip extensors  5/5   5/5   Adductor 5/5  5/5   Abductor 5/5  5/5  Knee flexors  5/5   5/5   Knee extensors  5/5   5/5   Dorsiflexors  5/5   5/5   Plantarflexors  5/5   5/5   Toe extensors  5/5   5/5   Toe flexors  5/5   5/5   Tone (Ashworth scale)  0  0   MSRs:  Right        Left  brachioradialis 2+  2+  biceps 2+  2+  triceps 2+  2+  patellar 2+  2+  ankle jerk tr  tr  Hoffman no  no  plantar response down  down   SENSORY:  Vibration is absent distal to ankle bilaterally, temperature and pin prick is reduced over the feet.  Romberg's sign absent.   COORDINATION/GAIT: Normal finger-to- nose-finger.  Intact rapid alternating movements bilaterally.  Able to rise from a chair without using arms.  Gait narrow based and stable. Stressed gait intact.  Mild unsteadiness with tandem gait.   IMPRESSION: 1. 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  2. Right foot pain/swelling is most likely musculoskeletal and less likely due to nerve injury.  Because he feels there is more numbness in the leg, electrodiagnostic testing will be performed to be sure there is no overlapping lumbosacral radiculopathy.  - NCS/EMG bilateral legs  Further recommendations pending results.  Thank you for allowing me to participate in patient's care.  If I can answer any additional questions, I would be pleased to do so.    Sincerely,    Mayela Bullard K. Posey Pronto, DO

## 2018-12-12 ENCOUNTER — Ambulatory Visit: Payer: PPO | Admitting: Neurology

## 2019-01-12 ENCOUNTER — Other Ambulatory Visit: Payer: Self-pay

## 2019-01-13 ENCOUNTER — Ambulatory Visit (INDEPENDENT_AMBULATORY_CARE_PROVIDER_SITE_OTHER): Payer: PPO | Admitting: Neurology

## 2019-01-13 DIAGNOSIS — R200 Anesthesia of skin: Secondary | ICD-10-CM | POA: Diagnosis not present

## 2019-01-13 DIAGNOSIS — G621 Alcoholic polyneuropathy: Secondary | ICD-10-CM

## 2019-01-13 NOTE — Procedures (Signed)
[facility]  [address]  [place], [place] [address] Tel: [phone] Fax:  [phone] Test Date:  01/13/2019  Patient: [name] [name] DOB: 07/03/1945 Physician: Narda Amber, DO  Sex: Male Height: 5\' 11"  Ref Phys: Narda Amber, DO  ID#: [id number] Temp: 32.0C Technician:    Patient Complaints: This is a 74 year old man referred for evaluation of bilateral feet numbness and tingling, worse on the right.  NCV & EMG Findings: Extensive electrodiagnostic testing of the right lower extremity and additional studies of the left shows:  1. Bilateral sural and superficial peroneal sensory responses are within normal limits. 2. Bilateral peroneal and tibial motor responses show reduced amplitudes. 3. Tibial H reflex study is prolonged on the right and absent on the left. 4. Chronic motor axonal loss changes are seen affecting the muscles below the knee bilaterally, without accompanied active denervation.  Impression: The electrophysiologic findings are consistent with a chronic sensorimotor axonal polyneuropathy affecting the lower extremities.   ___________________________ Narda Amber, DO    Nerve Conduction Studies Anti Sensory Summary Table   Site NR Peak (ms) Norm Peak (ms) P-T Amp (V) Norm P-T Amp  Left Sup Peroneal Anti Sensory (Ant Lat Mall)  32C  12 cm NR  <4.6  >3  Right Sup Peroneal Anti Sensory (Ant Lat Mall)  32C  12 cm NR  <4.6  >3  Left Sural Anti Sensory (Lat Mall)  32C  Calf NR  <4.6  >3  Right Sural Anti Sensory (Lat Mall)  32C  Calf NR  <4.6  >3   Motor Summary Table   Site NR Onset (ms) Norm Onset (ms) O-P Amp (mV) Norm O-P Amp Site1 Site2 Delta-0 (ms) Dist (cm) Vel (m/s) Norm Vel (m/s)  Left Peroneal Motor (Ext Dig Brev)  32C  Ankle    4.7 <6.0 2.2 >2.5 B Fib Ankle 8.7 37.0 43 >40  B Fib    13.4  2.0  Poplt B Fib 1.7 9.0 53 >40  Poplt    15.1  2.0         Right Peroneal Motor (Ext Dig Brev)  32C  Ankle    4.0 <6.0  1.1 >2.5 B Fib Ankle 10.5 42.0 40 >40  B Fib    14.5  0.7  Poplt B Fib 1.5 8.0 53 >40  Poplt    16.0  0.6         Left Peroneal TA Motor (Tib Ant)  32C  Fib Head    3.5 <4.5 2.5 >3 Poplit Fib Head 1.2 8.0 67 >40  Poplit    4.7  2.4         Right Peroneal TA Motor (Tib Ant)  32C  Fib Head    4.2 <4.5 2.4 >3 Poplit Fib Head 1.4 8.0 57 >40  Poplit    5.6  2.3         Left Tibial Motor (Abd Hall Brev)  32C  Ankle    4.1 <6.0 1.6 >4 Knee Ankle 9.2 42.0 46 >40  Knee    13.3  1.4         Right Tibial Motor (Abd Hall Brev)  32C  Ankle    4.5 <6.0 1.4 >4 Knee Ankle 8.8 42.0 48 >40  Knee    13.3  1.3          H Reflex Studies   NR H-Lat (ms) Lat Norm (ms) L-R H-Lat (ms)  Left Tibial (Gastroc)  32C  NR  <  35   Right Tibial (Gastroc)  32C     38.78 <35    EMG   Side Muscle Ins Act Fibs Psw Fasc Number Recrt Dur Dur. Amp Amp. Poly Poly. Comment  Right AntTibialis Nml Nml Nml Nml 2- Rapid Many 1+ Some 1+ Some 1+ N/A  Right Gastroc Nml Nml Nml Nml 1- Rapid Some 1+ Some 1+ Some 1+ N/A  Right RectFemoris Nml Nml Nml Nml Nml Nml Nml Nml Nml Nml Nml Nml N/A  Right GluteusMed Nml Nml Nml Nml Nml Nml Nml Nml Nml Nml Nml Nml N/A  Left BicepsFemS Nml Nml Nml Nml Nml Nml Nml Nml Nml Nml Nml Nml N/A  Right BicepsFemS Nml Nml Nml Nml Nml Nml Nml Nml Nml Nml Nml Nml N/A  Left AntTibialis Nml Nml Nml Nml 2- Rapid Some 1+ Some 1+ Some 1+ N/A  Left Gastroc Nml Nml Nml Nml 1- Rapid Some 1+ Some 1+ Some 1+ N/A  Left RectFemoris Nml Nml Nml Nml Nml Nml Nml Nml Nml Nml Nml Nml N/A  Left GluteusMed Nml Nml Nml Nml Nml Nml Nml Nml Nml Nml Nml Nml N/A      Waveforms:

## 2019-01-14 ENCOUNTER — Ambulatory Visit: Payer: PPO | Attending: Internal Medicine

## 2019-01-14 DIAGNOSIS — Z20822 Contact with and (suspected) exposure to covid-19: Secondary | ICD-10-CM

## 2019-01-15 LAB — NOVEL CORONAVIRUS, NAA: SARS-CoV-2, NAA: NOT DETECTED

## 2019-03-03 ENCOUNTER — Other Ambulatory Visit: Payer: Self-pay

## 2019-03-03 ENCOUNTER — Encounter (HOSPITAL_COMMUNITY): Payer: Self-pay | Admitting: *Deleted

## 2019-03-03 ENCOUNTER — Encounter: Payer: Self-pay | Admitting: Pulmonary Disease

## 2019-03-03 ENCOUNTER — Ambulatory Visit: Payer: PPO | Admitting: Pulmonary Disease

## 2019-03-03 DIAGNOSIS — J449 Chronic obstructive pulmonary disease, unspecified: Secondary | ICD-10-CM

## 2019-03-03 NOTE — Assessment & Plan Note (Signed)
Continue Anoro Feel that deconditioning is more of an issue due to his right ankle injury -referral to pulmonary rehab and I feel that this will be good for him especially with his other ongoing stressors and deconditioning. Annual low-dose CT screening, next in 06/2019  Refill on albuterol MDI as needed  Anxiety being addressed by PCP but probably also contributing to dyspnea

## 2019-03-03 NOTE — Addendum Note (Signed)
Addended by: Jannette Spanner on: 03/03/2019 10:32 AM   Modules accepted: Orders

## 2019-03-03 NOTE — Progress Notes (Signed)
Received referral from Dr. [name]  for this pt to participate in pulmonary rehab with the the diagnosis of COPD Chronic bronchitis and emphysema. Clinical review of pt follow up appt on 03/03/19. Pulmonary office note.  Pt with Covid Risk Score - 5 Pt appropriate for scheduling for Pulmonary rehab.  Will forward to support staff for verification of insurance eligibility/benefits and pulmonary rehab staff for scheduling with pt consent. Cherre Huger, BSN Cardiac and Training and development officer

## 2019-03-03 NOTE — Patient Instructions (Addendum)
Referral to pulm rehab Stay on Anoro

## 2019-03-03 NOTE — Progress Notes (Signed)
   Subjective:    Patient ID: [name] [name], male    DOB: [birth year], 74 y.o.   MRN: [id number]  HPI  74 yo heavy ex-smoker  For FU of  COPD. He smoked about a pack per day starting as a teenager until he quit in 2010- about 40 pack years.   Reviewed last OV 03/2018 Started on Anoro then which is worked well for him, better than Spiriva and he has been getting refills from his PCP. Reviewed last screening CT chest from 06/2018 and findings there with him He reports persistent dyspnea which he attributes to deconditioning from his right ankle injury, rehab did not help him much for his right ankle and his orthopedic doctor felt like there was nothing more that could be done for his ankle  He is also going through family stressors, separated from his wife and is needing some Xanax from his PCP  No intercurrent exacerbations  Significant tests/ events reviewed  Low-dose CT chest 06/2018  RADS 2, benign right-sided pulmonary nodules, stable compared to prior CTs.  Moderate emphysema, aortic atherosclerosis.  PFTs 09/2012 moderate reversible airway obstruction, ratio 49, FEV1 44%, improved to 56%/25% bronchodilator response, TLC 88%, DLCO 55%  Spirometry 03/18/2018 showed ratio of 61, FEV1 of 43% and FVC of 52%  Review of Systems Patient denies significant dyspnea,cough, hemoptysis,  chest pain, palpitations, pedal edema, orthopnea, paroxysmal nocturnal dyspnea, lightheadedness, nausea, vomiting, abdominal or  leg pains      Objective:   Physical Exam   Gen. Pleasant, obese, in no distress ENT - no lesions, no post nasal drip Neck: No JVD, no thyromegaly, no carotid bruits Lungs: no use of accessory muscles, no dullness to percussion, decreased without rales or rhonchi  Cardiovascular: Rhythm regular, heart sounds  normal, no murmurs or gallops, no peripheral edema Musculoskeletal: No deformities, no cyanosis or clubbing , no tremors        Assessment & Plan:

## 2019-03-04 DIAGNOSIS — I10 Essential (primary) hypertension: Secondary | ICD-10-CM | POA: Diagnosis not present

## 2019-03-04 DIAGNOSIS — J449 Chronic obstructive pulmonary disease, unspecified: Secondary | ICD-10-CM | POA: Diagnosis not present

## 2019-03-04 DIAGNOSIS — F419 Anxiety disorder, unspecified: Secondary | ICD-10-CM | POA: Diagnosis not present

## 2019-03-04 DIAGNOSIS — E7800 Pure hypercholesterolemia, unspecified: Secondary | ICD-10-CM | POA: Diagnosis not present

## 2019-03-05 ENCOUNTER — Telehealth (HOSPITAL_COMMUNITY): Payer: Self-pay

## 2019-03-05 NOTE — Telephone Encounter (Signed)
Pt insurance is active and benefits verified through [employer] adv Co-pay $15, DED 0/0 met, out of pocket $3,400/0 met, co-insurance 0%. no pre-authorization required, REF# [id number]  Will contact patient to see if he is interested in the Cardiac Rehab Program. If interested, patient will need to complete follow up appt. Once completed, patient will be contacted for scheduling upon review by the RN Navigator.

## 2019-03-05 NOTE — Telephone Encounter (Signed)
Pt insurance is active and benefits verified through [employer] adv Co-pay $15, DED 0/0 met, out of pocket $3,400/0 met, co-insurance 0%. no pre-authorization required, REF# [id number]

## 2019-03-06 ENCOUNTER — Telehealth (HOSPITAL_COMMUNITY): Payer: Self-pay

## 2019-03-09 ENCOUNTER — Telehealth: Payer: Self-pay | Admitting: Pulmonary Disease

## 2019-03-09 ENCOUNTER — Other Ambulatory Visit: Payer: Self-pay

## 2019-03-09 ENCOUNTER — Encounter (HOSPITAL_COMMUNITY)
Admission: RE | Admit: 2019-03-09 | Discharge: 2019-03-09 | Disposition: A | Discharge status: Home / Self Care | Payer: PPO | Source: Ambulatory Visit | Attending: Pulmonary Disease | Admitting: Pulmonary Disease

## 2019-03-09 DIAGNOSIS — J449 Chronic obstructive pulmonary disease, unspecified: Secondary | ICD-10-CM | POA: Insufficient documentation

## 2019-03-09 NOTE — Progress Notes (Signed)
[name] [name] 74 y.o. male Pulmonary Rehab Orientation Note Patient arrived today in Cardiac and Pulmonary Rehab for orientation to Pulmonary Rehab.  He does not carry portable oxygen. Per pt, he uses oxygen never. During his 6 minute walk test patient desaturated to 84% on room air at the 2 minute 45 second mark and was placed on 2 liters/minute.  He maintained above 88% thereafter.  Color good, skin warm and dry. Patient is oriented to time and place. Patient's medical history, psychosocial health, and medications reviewed. Psychosocial assessment reveals pt lives with their daughter. Pt is currently retired after working at [employer].  Pt reports his stress level is moderate. Areas of stress/anxiety include Family. He has a difficult relationship with his son. Pt does not exhibit signs of depression. PHQ2/9 score 0/0. Pt shows fair  coping skills with not done outlook .  Offered emotional support and reassurance. Will continue to monitor and evaluate progress toward psychosocial goal(s) of managing stress in a healthy manner. Physical assessment reveals heart rate is normal, breath sounds clear to auscultation, no wheezes, rales, or rhonchi, diminished. Grip strength equal, strong. Distal pulses 1+ bilateral posterior tibial pulses present with 3+ ankle edema and both lower legs are red, swollen and edematous .  Patient states that his dermatologist and PCP are aware of his edema. Patient reports he does take medications as prescribed. Patient states he follows a Regular diet. The patient reports no specific efforts to gain or lose weight.. Patient's weight will be monitored closely. Demonstration and practice of PLB using pulse oximeter. Patient able to return demonstration satisfactorily. Safety and hand hygiene in the exercise area reviewed with patient. Patient voices understanding of the information reviewed. Department expectations discussed with patient and achievable goals were set.  The patient shows enthusiasm about attending the program and we look forward to working with this nice gentleman. The patient completed a 6 min walk test today and to begin exercise on 03/17/2019 in the 1045 class.  [id number]

## 2019-03-09 NOTE — Telephone Encounter (Signed)
Called and spoke with pt in regards to the message sent by Kirk Ruths with Pulm Rehab and the O2. Pt is unsure if he wants to be started on O2 and wanted to further discuss this. Pt has been scheduled for a televisit with TP Fri 3/5 at 10:30. Nothing further needed.

## 2019-03-09 NOTE — Telephone Encounter (Signed)
Patient states would like to discuss oxygen needs before being ordered.  Patient phone number is [phone].

## 2019-03-09 NOTE — Progress Notes (Signed)
Pulmonary Individual Treatment Plan  Patient Details  Name: [name] [name] MRN: [id number] Date of Birth: [birth year] Referring Provider:     Pulmonary Rehab Walk Test from 03/09/2019 in [place]  Referring Provider  Dr [name]      Initial Encounter Date:    Pulmonary Rehab Walk Test from 03/09/2019 in [facility]  Date  03/09/19      Visit Diagnosis: No diagnosis found.  Patient's Home Medications on Admission:   Current Outpatient Medications:  .  albuterol (PROVENTIL HFA;VENTOLIN HFA) 108 (90 Base) MCG/ACT inhaler, Inhale 2 puffs into the lungs every 6 (six) hours as needed for wheezing or shortness of breath., Disp: 1 Inhaler, Rfl: 2 .  ALPRAZolam (XANAX) 0.25 MG tablet, Take 0.25 mg by mouth daily as needed., Disp: , Rfl:  .  amLODipine (NORVASC) 2.5 MG tablet, Take 2.5 mg by mouth daily., Disp: , Rfl:  .  amlodipine-atorvastatin (CADUET) 10-10 MG tablet, Take 1 tablet by mouth daily., Disp: , Rfl:  .  furosemide (LASIX) 20 MG tablet, Take 20 mg by mouth daily., Disp: , Rfl:  .  ibuprofen (ADVIL,MOTRIN) 200 MG tablet, Take 400 mg by mouth every 6 (six) hours as needed., Disp: , Rfl:  .  loratadine (CLARITIN) 10 MG tablet, Take 10 mg by mouth daily as needed for allergies., Disp: , Rfl:  .  umeclidinium-vilanterol (ANORO ELLIPTA) 62.5-25 MCG/INH AEPB, Inhale 1 puff into the lungs daily., Disp: 2 each, Rfl: 0 .  vitamin B-12 (CYANOCOBALAMIN) 1000 MCG tablet, Take 1,000 mcg by mouth., Disp: , Rfl:  .  atorvastatin (LIPITOR) 10 MG tablet, Take 10 mg by mouth daily., Disp: , Rfl:   Past Medical History: Past Medical History:  Diagnosis Date  . COPD (chronic obstructive pulmonary disease) (HCC)     Tobacco Use: Social History   Tobacco Use  Smoking Status Former Smoker  . Packs/day: 2.00  . Years: 40.00  . Pack years: 80.00  . Types: Cigarettes  . Quit date: 2010  . Years since quitting: 11.1  Smokeless  Tobacco Never Used    Labs: Recent Review Flowsheet Data    There is no flowsheet data to display.      Capillary Blood Glucose: No results found for: GLUCAP   Pulmonary Assessment Scores: Pulmonary Assessment Scores    Row Name 03/09/19 1006 03/09/19 1041       ADL UCSD   ADL Phase  Entry  Entry    SOB Score total  61  -      CAT Score   CAT Score  15  -      mMRC Score   mMRC Score  -  2      UCSD: Self-administered rating of dyspnea associated with activities of daily living (ADLs) 6-point scale (0 = "not at all" to 5 = "maximal or unable to do because of breathlessness")  Scoring Scores range from 0 to 120.  Minimally important difference is 5 units  CAT: CAT can identify the health impairment of COPD patients and is better correlated with disease progression.  CAT has a scoring range of zero to 40. The CAT score is classified into four groups of low (less than 10), medium (10 - 20), high (21-30) and very high (31-40) based on the impact level of disease on health status. A CAT score over 10 suggests significant symptoms.  A worsening CAT score could be explained by an exacerbation, poor medication adherence,  poor inhaler technique, or progression of COPD or comorbid conditions.  CAT MCID is 2 points  mMRC: mMRC (Modified Medical Research Council) Dyspnea Scale is used to assess the degree of baseline functional disability in patients of respiratory disease due to dyspnea. No minimal important difference is established. A decrease in score of 1 point or greater is considered a positive change.   Pulmonary Function Assessment: Pulmonary Function Assessment - 03/09/19 0953      Breath   Bilateral Breath Sounds  Clear;Decreased    Shortness of Breath  Yes;Limiting activity;Panic with Shortness of Breath       Exercise Target Goals: Exercise Program Goal: Individual exercise prescription set using results from initial 6 min walk test and THRR while considering   patient's activity barriers and safety.   Exercise Prescription Goal: Initial exercise prescription builds to 30-45 minutes a day of aerobic activity, 2-3 days per week.  Home exercise guidelines will be given to patient during program as part of exercise prescription that the participant will acknowledge.  Activity Barriers & Risk Stratification: Activity Barriers & Cardiac Risk Stratification - 03/09/19 0950      Activity Barriers & Cardiac Risk Stratification   Activity Barriers  Joint Problems;Deconditioning;Muscular Weakness;Shortness of Breath       6 Minute Walk: 6 Minute Walk    Row Name 03/09/19 1041         6 Minute Walk   Phase  Initial     Distance  1052 feet     Walk Time  4.83 minutes     # of Rest Breaks  1     MPH  1.99     METS  2.84     RPE  13     Perceived Dyspnea   3     VO2 Peak  9.91     Symptoms  Yes (comment)     Comments  1 min 10 sec standing rest due to desaturation. Recovered on 2L.     Resting HR  96 bpm     Resting BP  131/62     Resting Oxygen Saturation   94 %     Exercise Oxygen Saturation  during 6 min walk  84 %     Max Ex. HR  126 bpm     Max Ex. BP  180/82     2 Minute Post BP  165/83       Interval HR   1 Minute HR  117     2 Minute HR  126     3 Minute HR  126     4 Minute HR  110     5 Minute HR  116     6 Minute HR  126     2 Minute Post HR  106     Interval Heart Rate?  Yes       Interval Oxygen   Interval Oxygen?  Yes     Baseline Oxygen Saturation %  94 %     1 Minute Oxygen Saturation %  93 %     1 Minute Liters of Oxygen  0 L     2 Minute Oxygen Saturation %  88 %     2 Minute Liters of Oxygen  0 L     3 Minute Oxygen Saturation %  84 %     3 Minute Liters of Oxygen  0 L     4 Minute Oxygen Saturation %  92 %  4 Minute Liters of Oxygen  2 L     5 Minute Oxygen Saturation %  95 %     5 Minute Liters of Oxygen  2 L     6 Minute Oxygen Saturation %  91 %     6 Minute Liters of Oxygen  2 L     2 Minute Post  Oxygen Saturation %  97 %     2 Minute Post Liters of Oxygen  0 L        Oxygen Initial Assessment: Oxygen Initial Assessment - 03/09/19 1040      Home Oxygen   Home Oxygen Device  None    Sleep Oxygen Prescription  None    Home Exercise Oxygen Prescription  Continuous    Liters per minute  2    Home at Rest Exercise Oxygen Prescription  None    Compliance with Home Oxygen Use  Yes      Initial 6 min Walk   Oxygen Used  Continuous    Liters per minute  2      Program Oxygen Prescription   Program Oxygen Prescription  Continuous    Liters per minute  2      Intervention   Short Term Goals  To learn and exhibit compliance with exercise, home and travel O2 prescription;To learn and understand importance of monitoring SPO2 with pulse oximeter and demonstrate accurate use of the pulse oximeter.;To learn and understand importance of maintaining oxygen saturations>88%;To learn and demonstrate proper pursed lip breathing techniques or other breathing techniques.;To learn and demonstrate proper use of respiratory medications    Long  Term Goals  Exhibits compliance with exercise, home and travel O2 prescription;Verbalizes importance of monitoring SPO2 with pulse oximeter and return demonstration;Maintenance of O2 saturations>88%;Exhibits proper breathing techniques, such as pursed lip breathing or other method taught during program session;Compliance with respiratory medication;Demonstrates proper use of MDI's       Oxygen Re-Evaluation:   Oxygen Discharge (Final Oxygen Re-Evaluation):   Initial Exercise Prescription: Initial Exercise Prescription - 03/09/19 1000      Date of Initial Exercise RX and Referring Provider   Date  03/09/19    Referring Provider  Dr [name]      Oxygen   Oxygen  Continuous    Liters  2      Recumbant Bike   Level  2    RPM  45    Minutes  15      Arm Ergometer   Level  2    Minutes  15      Prescription Details   Frequency (times per week)  2     Duration  Progress to 30 minutes of continuous aerobic without signs/symptoms of physical distress      Intensity   THRR 40-80% of Max Heartrate  60-119    Ratings of Perceived Exertion  11-13    Perceived Dyspnea  0-4      Progression   Progression  Continue progressive overload as per policy without signs/symptoms or physical distress.      Resistance Training   Training Prescription  Yes    Weight  blue bands    Reps  10-15       Perform Capillary Blood Glucose checks as needed.  Exercise Prescription Changes:   Exercise Comments:   Exercise Goals and Review: Exercise Goals    Row Name 03/09/19 1102             Exercise Goals  Increase Physical Activity  Yes       Intervention  Provide advice, education, support and counseling about physical activity/exercise needs.;Develop an individualized exercise prescription for aerobic and resistive training based on initial evaluation findings, risk stratification, comorbidities and participant's personal goals.       Expected Outcomes  Short Term: Attend rehab on a regular basis to increase amount of physical activity.;Long Term: Add in home exercise to make exercise part of routine and to increase amount of physical activity.;Long Term: Exercising regularly at least 3-5 days a week.       Increase Strength and Stamina  Yes       Intervention  Provide advice, education, support and counseling about physical activity/exercise needs.;Develop an individualized exercise prescription for aerobic and resistive training based on initial evaluation findings, risk stratification, comorbidities and participant's personal goals.       Expected Outcomes  Short Term: Increase workloads from initial exercise prescription for resistance, speed, and METs.;Short Term: Perform resistance training exercises routinely during rehab and add in resistance training at home;Long Term: Improve cardiorespiratory fitness, muscular endurance and strength as  measured by increased METs and functional capacity (6MWT)       Able to understand and use rate of perceived exertion (RPE) scale  Yes       Intervention  Provide education and explanation on how to use RPE scale       Expected Outcomes  Short Term: Able to use RPE daily in rehab to express subjective intensity level;Long Term:  Able to use RPE to guide intensity level when exercising independently       Able to understand and use Dyspnea scale  Yes       Intervention  Provide education and explanation on how to use Dyspnea scale       Expected Outcomes  Short Term: Able to use Dyspnea scale daily in rehab to express subjective sense of shortness of breath during exertion;Long Term: Able to use Dyspnea scale to guide intensity level when exercising independently       Knowledge and understanding of Target Heart Rate Range (THRR)  Yes       Intervention  Provide education and explanation of THRR including how the numbers were predicted and where they are located for reference       Expected Outcomes  Short Term: Able to state/look up THRR;Short Term: Able to use daily as guideline for intensity in rehab;Long Term: Able to use THRR to govern intensity when exercising independently       Understanding of Exercise Prescription  Yes       Intervention  Provide education, explanation, and written materials on patient's individual exercise prescription       Expected Outcomes  Short Term: Able to explain program exercise prescription;Long Term: Able to explain home exercise prescription to exercise independently          Exercise Goals Re-Evaluation :   Discharge Exercise Prescription (Final Exercise Prescription Changes):   Nutrition:  Target Goals: Understanding of nutrition guidelines, daily intake of sodium 1500mg , cholesterol 200mg , calories 30% from fat and 7% or less from saturated fats, daily to have 5 or more servings of fruits and vegetables.  Biometrics: Pre Biometrics - 03/09/19 0952       Pre Biometrics   Height  5\' 10"  (1.778 m)    Weight  104.1 kg    BMI (Calculated)  32.93    Grip Strength  27 kg  Nutrition Therapy Plan and Nutrition Goals:   Nutrition Assessments:   Nutrition Goals Re-Evaluation:   Nutrition Goals Discharge (Final Nutrition Goals Re-Evaluation):   Psychosocial: Target Goals: Acknowledge presence or absence of significant depression and/or stress, maximize coping skills, provide positive support system. Participant is able to verbalize types and ability to use techniques and skills needed for reducing stress and depression.  Initial Review & Psychosocial Screening: Initial Psych Review & Screening - 03/09/19 1013      Initial Review   Current issues with  Current Anxiety/Panic;Current Stress Concerns  (Pended)     Source of Stress Concerns  Family  (Pended)    Current stress with his son, is being treated for anxiety     Family Dynamics   Good Support System?  Yes  (Pended)        Quality of Life Scores:  Scores of 19 and below usually indicate a poorer quality of life in these areas.  A difference of  2-3 points is a clinically meaningful difference.  A difference of 2-3 points in the total score of the Quality of Life Index has been associated with significant improvement in overall quality of life, self-image, physical symptoms, and general health in studies assessing change in quality of life.  PHQ-9: Recent Review Flowsheet Data    Depression screen [facility] 2/9 03/09/2019   Decreased Interest 0   Down, Depressed, Hopeless 0   PHQ - 2 Score 0   Altered sleeping 0   Tired, decreased energy 0   Change in appetite 0   Feeling bad or failure about yourself  0   Moving slowly or fidgety/restless 0   Suicidal thoughts 0   PHQ-9 Score 0   Difficult doing work/chores Not difficult at all     Interpretation of Total Score  Total Score Depression Severity:  1-4 = Minimal depression, 5-9 = Mild depression, 10-14 = Moderate  depression, 15-19 = Moderately severe depression, 20-27 = Severe depression   Psychosocial Evaluation and Intervention: Psychosocial Evaluation - 03/09/19 1056      Psychosocial Evaluation & Interventions   Interventions  Encouraged to exercise with the program and follow exercise prescription;Stress management education    Comments  Patient has stress and anxiety issues with his son    Expected Outcomes  For patient to manage stress in a healthy manner    Continue Psychosocial Services   Follow up required by staff       Psychosocial Re-Evaluation:   Psychosocial Discharge (Final Psychosocial Re-Evaluation):   Education: Education Goals: Education classes will be provided on a weekly basis, covering required topics. Participant will state understanding/return demonstration of topics presented.  Learning Barriers/Preferences: Learning Barriers/Preferences - 03/09/19 1101      Learning Barriers/Preferences   Learning Barriers  None    Learning Preferences  Audio;Computer/Internet;Group Instruction;Individual Instruction;Pictoral;Skilled Demonstration;Verbal Instruction;Video;Written Material       Education Topics: Risk Factor Reduction:  -Group instruction that is supported by a PowerPoint presentation. Instructor discusses the definition of a risk factor, different risk factors for pulmonary disease, and how the heart and lungs work together.     Nutrition for Pulmonary Patient:  -Group instruction provided by PowerPoint slides, verbal discussion, and written materials to support subject matter. The instructor gives an explanation and review of healthy diet recommendations, which includes a discussion on weight management, recommendations for fruit and vegetable consumption, as well as protein, fluid, caffeine, fiber, sodium, sugar, and alcohol. Tips for eating when patients are short of breath  are discussed.   Pursed Lip Breathing:  -Group instruction that is supported by  demonstration and informational handouts. Instructor discusses the benefits of pursed lip and diaphragmatic breathing and detailed demonstration on how to preform both.     Oxygen Safety:  -Group instruction provided by PowerPoint, verbal discussion, and written material to support subject matter. There is an overview of "What is Oxygen" and "Why do we need it".  Instructor also reviews how to create a safe environment for oxygen use, the importance of using oxygen as prescribed, and the risks of noncompliance. There is a brief discussion on traveling with oxygen and resources the patient may utilize.   Oxygen Equipment:  -Group instruction provided by [facility] Staff utilizing handouts, written materials, and equipment demonstrations.   Signs and Symptoms:  -Group instruction provided by written material and verbal discussion to support subject matter. Warning signs and symptoms of infection, stroke, and heart attack are reviewed and when to call the physician/911 reinforced. Tips for preventing the spread of infection discussed.   Advanced Directives:  -Group instruction provided by verbal instruction and written material to support subject matter. Instructor reviews Advanced Directive laws and proper instruction for filling out document.   Pulmonary Video:  -Group video education that reviews the importance of medication and oxygen compliance, exercise, good nutrition, pulmonary hygiene, and pursed lip and diaphragmatic breathing for the pulmonary patient.   Exercise for the Pulmonary Patient:  -Group instruction that is supported by a PowerPoint presentation. Instructor discusses benefits of exercise, core components of exercise, frequency, duration, and intensity of an exercise routine, importance of utilizing pulse oximetry during exercise, safety while exercising, and options of places to exercise outside of rehab.     Pulmonary Medications:  -Verbally interactive group education  provided by instructor with focus on inhaled medications and proper administration.   Anatomy and Physiology of the Respiratory System and Intimacy:  -Group instruction provided by PowerPoint, verbal discussion, and written material to support subject matter. Instructor reviews respiratory cycle and anatomical components of the respiratory system and their functions. Instructor also reviews differences in obstructive and restrictive respiratory diseases with examples of each. Intimacy, Sex, and Sexuality differences are reviewed with a discussion on how relationships can change when diagnosed with pulmonary disease. Common sexual concerns are reviewed.   MD DAY -A group question and answer session with a medical doctor that allows participants to ask questions that relate to their pulmonary disease state.   OTHER EDUCATION -Group or individual verbal, written, or video instructions that support the educational goals of the pulmonary rehab program.   Holiday Eating Survival Tips:  -Group instruction provided by PowerPoint slides, verbal discussion, and written materials to support subject matter. The instructor gives patients tips, tricks, and techniques to help them not only survive but enjoy the holidays despite the onslaught of food that accompanies the holidays.   Knowledge Questionnaire Score: Knowledge Questionnaire Score - 03/09/19 1012      Knowledge Questionnaire Score   Pre Score  15/18       Core Components/Risk Factors/Patient Goals at Admission: Personal Goals and Risk Factors at Admission - 03/09/19 1102      Core Components/Risk Factors/Patient Goals on Admission   Improve shortness of breath with ADL's  Yes    Intervention  Provide education, individualized exercise plan and daily activity instruction to help decrease symptoms of SOB with activities of daily living.    Expected Outcomes  Short Term: Improve cardiorespiratory fitness to achieve a reduction  of symptoms  when performing ADLs;Long Term: Be able to perform more ADLs without symptoms or delay the onset of symptoms    Stress  Yes    Intervention  --   in hopes that exercise will help relieve stress      Core Components/Risk Factors/Patient Goals Review:  Goals and Risk Factor Review    Row Name 03/09/19 1103             Core Components/Risk Factors/Patient Goals Review   Personal Goals Review  Develop more efficient breathing techniques such as purse lipped breathing and diaphragmatic breathing and practicing self-pacing with activity.;Increase knowledge of respiratory medications and ability to use respiratory devices properly.;Improve shortness of breath with ADL's;Stress;Lipids          Core Components/Risk Factors/Patient Goals at Discharge (Final Review):  Goals and Risk Factor Review - 03/09/19 1103      Core Components/Risk Factors/Patient Goals Review   Personal Goals Review  Develop more efficient breathing techniques such as purse lipped breathing and diaphragmatic breathing and practicing self-pacing with activity.;Increase knowledge of respiratory medications and ability to use respiratory devices properly.;Improve shortness of breath with ADL's;Stress;Lipids       ITP Comments:   Comments:

## 2019-03-13 ENCOUNTER — Ambulatory Visit (INDEPENDENT_AMBULATORY_CARE_PROVIDER_SITE_OTHER): Payer: PPO | Admitting: Adult Health

## 2019-03-13 ENCOUNTER — Encounter: Payer: Self-pay | Admitting: Adult Health

## 2019-03-13 ENCOUNTER — Other Ambulatory Visit: Payer: Self-pay

## 2019-03-13 DIAGNOSIS — J4489 Other specified chronic obstructive pulmonary disease: Secondary | ICD-10-CM

## 2019-03-13 DIAGNOSIS — J449 Chronic obstructive pulmonary disease, unspecified: Secondary | ICD-10-CM | POA: Diagnosis not present

## 2019-03-13 DIAGNOSIS — R0902 Hypoxemia: Secondary | ICD-10-CM

## 2019-03-13 NOTE — Patient Instructions (Addendum)
Continue on ANORO 1 puff daily  Check oxygen levels at home with normal activities , goal is to keep oxygen saturations >88-90% . Call to report if oxygen levels <88% .  Activity as tolerated.  Continue on pulmonary rehab.  Follow up as planned in August , will change to in person visit

## 2019-03-13 NOTE — Progress Notes (Signed)
Virtual Visit via Telephone Note  I connected with [name] [name] on 03/13/19 at 10:30 AM EST by telephone and verified that I am speaking with the correct person using two identifiers.  Location: Patient:Home  Provider: Office    I discussed the limitations, risks, security and privacy concerns of performing an evaluation and management service by telephone and the availability of in person appointments. I also discussed with the patient that there may be a patient responsible charge related to this service. The patient expressed understanding and agreed to proceed.   History of Present Illness:   74 year old male former smoker followed for severe COPD  Today's televisit is a follow-up of COPD.  Patient says he has been referred to pulmonary rehab and is recently started exercising there.  During a evaluation and 6-minute walk test patient was noted to note dropped his oxygen level down into the mid to upper 80s.  He did require oxygen during his exercise portion of pulmonary rehab.  Patient is calling in concerned that he may need oxygen.  However says he does not want to begin this unless as he has to.  Patient says he tries to be active and at home he checks his oxygen level and his oxygen level stayed above 90% with his activities at home and outside in the yard.  Patient says his breathing is at baseline.  He does get winded with heavy activity.  He denies any flare of cough or wheezing.  Patient Active Problem List   Diagnosis Date Noted  . COPD with chronic bronchitis and emphysema ([place]) 03/18/2018   Current Outpatient Medications on File Prior to Visit  Medication Sig Dispense Refill  . albuterol (PROVENTIL HFA;VENTOLIN HFA) 108 (90 Base) MCG/ACT inhaler Inhale 2 puffs into the lungs every 6 (six) hours as needed for wheezing or shortness of breath. 1 Inhaler 2  . ALPRAZolam (XANAX) 0.25 MG tablet Take 0.25 mg by mouth daily as needed.    Marland Kitchen amLODipine (NORVASC) 2.5 MG tablet  Take 2.5 mg by mouth daily.    Marland Kitchen amlodipine-atorvastatin (CADUET) 10-10 MG tablet Take 1 tablet by mouth daily.    Marland Kitchen atorvastatin (LIPITOR) 10 MG tablet Take 10 mg by mouth daily.    . furosemide (LASIX) 20 MG tablet Take 20 mg by mouth daily.    Marland Kitchen ibuprofen (ADVIL,MOTRIN) 200 MG tablet Take 400 mg by mouth every 6 (six) hours as needed.    . loratadine (CLARITIN) 10 MG tablet Take 10 mg by mouth daily as needed for allergies.    Marland Kitchen umeclidinium-vilanterol (ANORO ELLIPTA) 62.5-25 MCG/INH AEPB Inhale 1 puff into the lungs daily. 2 each 0  . vitamin B-12 (CYANOCOBALAMIN) 1000 MCG tablet Take 1,000 mcg by mouth.     No current facility-administered medications on file prior to visit.      Observations/Objective: Low-dose CT chest 06/2018  RADS 2,benign right-sided pulmonary nodules, stable compared to prior CTs. Moderate emphysema, aortic atherosclerosis.  PFTs 09/2012 moderate reversible airway obstruction, ratio 49, FEV1 44%, improved to 56%/25% bronchodilator response, TLC 88%, DLCO 55%  Spirometry 03/18/2018 showed ratio of 61, FEV1 of 43% and FVC of 52%  Assessment and Plan: Severe COPD patient is stable on Anoro.-Pulmonary rehab will be very good for him.  Exertional hypoxemia.  Patient with documented desaturations with activity requiring oxygen at pulmonary rehab.  Patient is measuring oxygen at home with no drops in oxygen with his normal activities of daily living.  For now he is having no distress.  I have advised him to monitor it more closely keep a oxygen log and if he started to notice his oxygen levels are dropping down below 88% on a consistent basis to please call as he can come into the office and we can do a formal evaluation for this for now we will just continue her oxygen with activity at pulmonary rehab.  Plan  Patient Instructions  Continue on ANORO 1 puff daily  Check oxygen levels at home with normal activities , goal is to keep oxygen saturations >88-90% . Call to  report if oxygen levels <88% .  Activity as tolerated.  Continue on pulmonary rehab.  Follow up as planned in August , will change to in person visit        Follow Up Instructions: Follow-up in 6 months and as needed   I discussed the assessment and treatment plan with the patient. The patient was provided an opportunity to ask questions and all were answered. The patient agreed with the plan and demonstrated an understanding of the instructions.   The patient was advised to call back or seek an in-person evaluation if the symptoms worsen or if the condition fails to improve as anticipated.  I provided 25 minutes of non-face-to-face time during this encounter.   Rexene Edison, NP

## 2019-03-17 ENCOUNTER — Encounter (HOSPITAL_COMMUNITY)
Admission: RE | Admit: 2019-03-17 | Discharge: 2019-03-17 | Disposition: A | Discharge status: Home / Self Care | Payer: PPO | Source: Ambulatory Visit | Attending: Pulmonary Disease | Admitting: Pulmonary Disease

## 2019-03-17 ENCOUNTER — Other Ambulatory Visit: Payer: Self-pay

## 2019-03-17 VITALS — Wt 228.6 lb

## 2019-03-17 DIAGNOSIS — J449 Chronic obstructive pulmonary disease, unspecified: Secondary | ICD-10-CM | POA: Diagnosis not present

## 2019-03-17 NOTE — Progress Notes (Signed)
Daily Session Note  Patient Details  Name: [name] [name] MRN: [id number] Date of Birth: [birth year] Referring Provider:     Pulmonary Rehab Walk Test from 03/09/2019 in [place]  Referring Provider  Dr [name]      Encounter Date: 03/17/2019  Check In: Session Check In - 03/17/19 1026      Check-In   Supervising physician immediately available to respond to emergencies  [facility] immediately available    Physician(s)  Dr. Broadus John    Location  [facility]    Staff Present  Rosebud Poles, RN, Bjorn Loser, MS, Exercise Physiologist;Lisa Ysidro Evert, RN    Virtual Visit  No    Medication changes reported      No    Fall or balance concerns reported     No    Tobacco Cessation  No Change    Warm-up and Cool-down  Performed as group-led instruction    Resistance Training Performed  Yes    VAD Patient?  No    PAD/SET Patient?  No      Pain Assessment   Currently in Pain?  No/denies    Multiple Pain Sites  No       Capillary Blood Glucose: No results found for this or any previous visit (from the past 24 hour(s)).  Exercise Prescription Changes - 03/17/19 1100      Response to Exercise   Blood Pressure (Admit)  130/74    Blood Pressure (Exercise)  166/72    Blood Pressure (Exit)  142/78    Heart Rate (Admit)  108 bpm    Heart Rate (Exercise)  116 bpm    Heart Rate (Exit)  106 bpm    Oxygen Saturation (Admit)  92 %    Oxygen Saturation (Exercise)  92 %    Oxygen Saturation (Exit)  97 %    Rating of Perceived Exertion (Exercise)  13    Perceived Dyspnea (Exercise)  2    Duration  Continue with 30 min of aerobic exercise without signs/symptoms of physical distress.    Intensity  THRR unchanged      Progression   Progression  Continue to progress workloads to maintain intensity without signs/symptoms of physical distress.      Resistance Training   Training Prescription  Yes    Weight  blue bands    Reps   10-15      Oxygen   Oxygen  Continuous    Liters  2      NuStep   Level  2    SPM  80    Minutes  15    METs  1.9      Arm Ergometer   Level  2    Watts  34    Minutes  15       Social History   Tobacco Use  Smoking Status Former Smoker  . Packs/day: 2.00  . Years: 40.00  . Pack years: 80.00  . Types: Cigarettes  . Quit date: 2010  . Years since quitting: 11.1  Smokeless Tobacco Never Used    Goals Met:  Independence with exercise equipment Exercise tolerated well Strength training completed today  Goals Unmet:  Not Applicable  Comments: Service time is from 1008 to 1109    Dr. [name] is Medical Director for Cardiac Rehab at [facility].

## 2019-03-19 ENCOUNTER — Encounter (HOSPITAL_COMMUNITY)
Admission: RE | Admit: 2019-03-19 | Discharge: 2019-03-19 | Disposition: A | Discharge status: Home / Self Care | Payer: PPO | Source: Ambulatory Visit | Attending: Pulmonary Disease | Admitting: Pulmonary Disease

## 2019-03-19 ENCOUNTER — Other Ambulatory Visit: Payer: Self-pay

## 2019-03-19 DIAGNOSIS — J449 Chronic obstructive pulmonary disease, unspecified: Secondary | ICD-10-CM | POA: Diagnosis not present

## 2019-03-19 NOTE — Progress Notes (Signed)
Daily Session Note  Patient Details  Name: MICHOLAS DRUMWRIGHT MRN: [id number] Date of Birth: [birth year] Referring Provider:     Pulmonary Rehab Walk Test from 03/09/2019 in [place]  Referring Provider  Dr [name]      Encounter Date: 03/19/2019  Check In: Session Check In - 03/19/19 1122      Check-In   Supervising physician immediately available to respond to emergencies  [facility] immediately available    Physician(s)  Dr. Dessa Phi    Location  [facility]    Staff Present  Rosebud Poles, RN, Bjorn Loser, MS, Exercise Physiologist;Lisa Ysidro Evert, RN    Virtual Visit  No    Medication changes reported      No    Fall or balance concerns reported     No    Tobacco Cessation  No Change    Warm-up and Cool-down  Performed as group-led instruction    Resistance Training Performed  Yes    VAD Patient?  No    PAD/SET Patient?  No      Pain Assessment   Currently in Pain?  No/denies    Multiple Pain Sites  No       Capillary Blood Glucose: No results found for this or any previous visit (from the past 24 hour(s)).    Social History   Tobacco Use  Smoking Status Former Smoker  . Packs/day: 2.00  . Years: 40.00  . Pack years: 80.00  . Types: Cigarettes  . Quit date: 2010  . Years since quitting: 11.1  Smokeless Tobacco Never Used    Goals Met:  Proper associated with RPD/PD & O2 Sat Exercise tolerated well Strength training completed today  Goals Unmet:  Not Applicable  Comments: Service time is from 1030 to 3    Dr. [name] is Medical Director for Cardiac Rehab at [facility].

## 2019-03-19 NOTE — Progress Notes (Signed)
[name] [name] 73 y.o. male Nutrition Note  Visit Diagnosis: COPD   Past Medical History:  Diagnosis Date  . COPD (chronic obstructive pulmonary disease) ([facility])      Medications reviewed.   Current Outpatient Medications:  .  albuterol (PROVENTIL HFA;VENTOLIN HFA) 108 (90 Base) MCG/ACT inhaler, Inhale 2 puffs into the lungs every 6 (six) hours as needed for wheezing or shortness of breath., Disp: 1 Inhaler, Rfl: 2 .  ALPRAZolam (XANAX) 0.25 MG tablet, Take 0.25 mg by mouth daily as needed., Disp: , Rfl:  .  amLODipine (NORVASC) 2.5 MG tablet, Take 2.5 mg by mouth daily., Disp: , Rfl:  .  amlodipine-atorvastatin (CADUET) 10-10 MG tablet, Take 1 tablet by mouth daily., Disp: , Rfl:  .  atorvastatin (LIPITOR) 10 MG tablet, Take 10 mg by mouth daily., Disp: , Rfl:  .  furosemide (LASIX) 20 MG tablet, Take 20 mg by mouth daily., Disp: , Rfl:  .  ibuprofen (ADVIL,MOTRIN) 200 MG tablet, Take 400 mg by mouth every 6 (six) hours as needed., Disp: , Rfl:  .  loratadine (CLARITIN) 10 MG tablet, Take 10 mg by mouth daily as needed for allergies., Disp: , Rfl:  .  umeclidinium-vilanterol (ANORO ELLIPTA) 62.5-25 MCG/INH AEPB, Inhale 1 puff into the lungs daily., Disp: 2 each, Rfl: 0 .  vitamin B-12 (CYANOCOBALAMIN) 1000 MCG tablet, Take 1,000 mcg by mouth., Disp: , Rfl:    Ht Readings from Last 1 Encounters:  03/09/19 5\' 10"  (1.778 m)     Wt Readings from Last 3 Encounters:  03/17/19 228 lb 9.9 oz (103.7 kg)  03/09/19 229 lb 8 oz (104.1 kg)  03/03/19 226 lb (102.5 kg)     There is no height or weight on file to calculate BMI.   Social History   Tobacco Use  Smoking Status Former Smoker  . Packs/day: 2.00  . Years: 40.00  . Pack years: 80.00  . Types: Cigarettes  . Quit date: 2010  . Years since quitting: 11.1  Smokeless Tobacco Never Used      Nutrition Note  Spoke with pt. Nutrition Plan and Nutrition Survey goals reviewed with pt.   Pt reports HTN. We discussed his  diet recall. He does add salt to meals. He and his daughter do most of the cooking currently. He does not consider sodium on labels. He does not read labels at all. He is willing to make some changes. We discussed lowering sodium intake. Reviewed the benefits.   Pt expressed understanding of the information reviewed.   Nutrition Diagnosis ? Excessive sodium intake related to over consumption of processed food as evidenced by frequent consumption of convenience food/ canned vegetables and diagnosis of HTN  Nutrition Intervention ? Pt's individual nutrition plan reviewed with pt. ? Benefits of adopting healthy diet reviewed with Rate My Plate survey   ? Continue client-centered nutrition education by RD, as part of interdisciplinary care.  Goal(s) ? Pt to identify and limit food sources of saturated fat, trans fat, refined carbohydrates and sodium ? Pt to begin reading labels   Plan:   Will provide client-centered nutrition education as part of interdisciplinary care  Monitor and evaluate progress toward nutrition goal with team.   Michaele Offer, MS, RDN, LDN

## 2019-03-24 ENCOUNTER — Other Ambulatory Visit: Payer: Self-pay

## 2019-03-24 ENCOUNTER — Encounter (HOSPITAL_COMMUNITY)
Admission: RE | Admit: 2019-03-24 | Discharge: 2019-03-24 | Disposition: A | Discharge status: Home / Self Care | Payer: PPO | Source: Ambulatory Visit | Attending: Pulmonary Disease | Admitting: Pulmonary Disease

## 2019-03-24 DIAGNOSIS — J449 Chronic obstructive pulmonary disease, unspecified: Secondary | ICD-10-CM

## 2019-03-24 NOTE — Progress Notes (Signed)
Daily Session Note  Patient Details  Name: [name] [name] MRN: [id number] Date of Birth: [birth year] Referring Provider:     Pulmonary Rehab Walk Test from 03/09/2019 in [place]  Referring Provider  Dr [name]      Encounter Date: 03/24/2019  Check In:   Capillary Blood Glucose: No results found for this or any previous visit (from the past 24 hour(s)).    Social History   Tobacco Use  Smoking Status Former Smoker  . Packs/day: 2.00  . Years: 40.00  . Pack years: 80.00  . Types: Cigarettes  . Quit date: 2010  . Years since quitting: 11.2  Smokeless Tobacco Never Used    Goals Met:  Exercise tolerated well No report of cardiac concerns or symptoms Strength training completed today  Goals Unmet:  Not Applicable  Comments: Service time is from 1030 to 1135    Dr. [name] is Medical Director for Cardiac Rehab at [facility].

## 2019-03-26 ENCOUNTER — Other Ambulatory Visit: Payer: Self-pay

## 2019-03-26 ENCOUNTER — Encounter (HOSPITAL_COMMUNITY)
Admission: RE | Admit: 2019-03-26 | Discharge: 2019-03-26 | Disposition: A | Discharge status: Home / Self Care | Payer: PPO | Source: Ambulatory Visit | Attending: Pulmonary Disease | Admitting: Pulmonary Disease

## 2019-03-26 DIAGNOSIS — J449 Chronic obstructive pulmonary disease, unspecified: Secondary | ICD-10-CM

## 2019-03-26 NOTE — Progress Notes (Signed)
Daily Session Note  Patient Details  Name: [name] [name] MRN: [id number] Date of Birth: [birth year] Referring Provider:     Pulmonary Rehab Walk Test from 03/09/2019 in [place]  Referring Provider  Dr [name]      Encounter Date: 03/26/2019  Check In: Session Check In - 03/26/19 1128      Check-In   Supervising physician immediately available to respond to emergencies  [facility] immediately available    Physician(s)  Dr. Darrick Meigs    Location  [facility]    Staff Present  Rosebud Poles, RN, Bjorn Loser, MS, Exercise Physiologist;Yitzchak Kothari Ysidro Evert, RN    Virtual Visit  No    Medication changes reported      No    Fall or balance concerns reported     No    Tobacco Cessation  No Change    Warm-up and Cool-down  Performed as group-led instruction    Resistance Training Performed  Yes    VAD Patient?  No    PAD/SET Patient?  No      Pain Assessment   Currently in Pain?  No/denies    Multiple Pain Sites  No       Capillary Blood Glucose: No results found for this or any previous visit (from the past 24 hour(s)).    Social History   Tobacco Use  Smoking Status Former Smoker  . Packs/day: 2.00  . Years: 40.00  . Pack years: 80.00  . Types: Cigarettes  . Quit date: 2010  . Years since quitting: 11.2  Smokeless Tobacco Never Used    Goals Met:  Exercise tolerated well No report of cardiac concerns or symptoms Strength training completed today  Goals Unmet:  Not Applicable  Comments: Service time is from 1030 to 1120    Dr. [name] is Medical Director for Cardiac Rehab at [facility].

## 2019-03-30 ENCOUNTER — Encounter: Payer: Self-pay | Admitting: Primary Care

## 2019-03-30 ENCOUNTER — Ambulatory Visit: Payer: PPO | Admitting: Primary Care

## 2019-03-30 ENCOUNTER — Other Ambulatory Visit: Payer: Self-pay

## 2019-03-30 VITALS — BP 132/60 | HR 98 | Temp 98.0°F | Ht 71.0 in | Wt 232.0 lb

## 2019-03-30 DIAGNOSIS — J9611 Chronic respiratory failure with hypoxia: Secondary | ICD-10-CM | POA: Diagnosis not present

## 2019-03-30 DIAGNOSIS — J449 Chronic obstructive pulmonary disease, unspecified: Secondary | ICD-10-CM | POA: Diagnosis not present

## 2019-03-30 MED ORDER — TRELEGY ELLIPTA 100-62.5-25 MCG/INH IN AEPB: 1.0000 | INHALATION_SPRAY | Freq: Every day | RESPIRATORY_TRACT | 0 refills | Status: DC

## 2019-03-30 NOTE — Assessment & Plan Note (Signed)
-   No acute symptoms, however, newly requiring oxygen on exertion  - Audible wheezing t/o lung fields on exam - Patient had sig BD response on PFTs in 2014 - Trial Trelegy 100 1 puff daily (previously on Anoro) - FU in 2 weeks televisit with APP

## 2019-03-30 NOTE — Progress Notes (Signed)
@Patient  ID: [name] [name], male    DOB: 03-24-1945, 74 y.o.   MRN: [id number]  Chief Complaint  Patient presents with  . Follow-up    Pt here to discuss starting oxygen - PT had 6 minute walk at pulmonary rehab and oxygen levels dropped below 88 and they put him on oxygen with exercise - PT bought pulse ox for home and oxygen would drop to around 85 when carrying things and with activity    Referring provider: Lujean Amel, MD  HPI: 74 year old male, former smoker. PMH significant for COPD with emphysema. Patient of Dr. [name], last had televisit with pulmonary NP on 03/13/19.  Following with lung cancer screening program. LDCT June 2020 showed lung-RADS 2. Maintained on Anoro.  03/30/2019 Patient presents today for follow-up visit. He is here to start about starting oxygen. He had a 6 minute walk test at pulmonary rehab on March 1st and he desaturated to 84%. He now wears oxygen while exercising at pulmonary rehab. He has a pulse oximeter at home and states that his oxygen level will drop into the mid-80s on exertion but he recovers quickly with rest. He is compliant with Anoro once daily, occasionally uses his albuterol rescue inhaler before an activity. He has had some anxiety with hyperventilation. He spoke with PCP and he was given prescription for 10 tabs of xanax which lasted him 2-3 more months. Compliant with 20mg  lasix daily, leg swelling is at baseline. His weight typically is between 228-230 at home. He has received both pfizer covid vaccines. [facility] is in network, he is interested in [place]. He denies acute symptoms; no cough, chest tightness of wheezing.                         No Known Allergies  Immunization History  Administered Date(s) Administered  . Influenza,inj,Quad PF,6-35 Mos 11/09/2018    Past Medical History:  Diagnosis Date  . COPD (chronic obstructive pulmonary disease) (H[facility])     Tobacco History: Social History   Tobacco Use  Smoking Status Former  Smoker  . Packs/day: 2.00  . Years: 40.00  . Pack years: 80.00  . Types: Cigarettes  . Quit date: 2010  . Years since quitting: 11.2  Smokeless Tobacco Never Used   Counseling given: Not Answered   Outpatient Medications Prior to Visit  Medication Sig Dispense Refill  . albuterol (PROVENTIL HFA;VENTOLIN HFA) 108 (90 Base) MCG/ACT inhaler Inhale 2 puffs into the lungs every 6 (six) hours as needed for wheezing or shortness of breath. 1 Inhaler 2  . ALPRAZolam (XANAX) 0.25 MG tablet Take 0.25 mg by mouth daily as needed.    Marland Kitchen amLODipine (NORVASC) 2.5 MG tablet Take 5 mg by mouth daily.     Marland Kitchen amlodipine-atorvastatin (CADUET) 10-10 MG tablet Take 1 tablet by mouth daily.    Marland Kitchen atorvastatin (LIPITOR) 10 MG tablet Take 10 mg by mouth daily.    . furosemide (LASIX) 20 MG tablet Take 20 mg by mouth daily.    Marland Kitchen ibuprofen (ADVIL,MOTRIN) 200 MG tablet Take 400 mg by mouth every 6 (six) hours as needed.    . loratadine (CLARITIN) 10 MG tablet Take 10 mg by mouth daily as needed for allergies.    Marland Kitchen umeclidinium-vilanterol (ANORO ELLIPTA) 62.5-25 MCG/INH AEPB Inhale 1 puff into the lungs daily. 2 each 0  . vitamin B-12 (CYANOCOBALAMIN) 1000 MCG tablet Take 1,000 mcg by mouth.     No facility-administered medications prior  to visit.   Review of Systems  Review of Systems  Constitutional: Negative.   HENT: Positive for postnasal drip. Negative for congestion.   Respiratory: Positive for shortness of breath. Negative for cough, chest tightness and wheezing.   Cardiovascular: Positive for leg swelling.    Physical Exam  BP 132/60 (BP Location: Left Arm)   Pulse 98   Temp 98 F (36.7 C) (Oral)   Ht 5\' 11"  (1.803 m)   Wt 232 lb (105.2 kg)   SpO2 97%   BMI 32.36 kg/m  Physical Exam Constitutional:      Appearance: Normal appearance. He is obese. He is not ill-appearing.  HENT:     Head: Normocephalic and atraumatic.     Mouth/Throat:     Mouth: Mucous membranes are moist.     Pharynx:  Oropharynx is clear.  Cardiovascular:     Rate and Rhythm: Normal rate and regular rhythm.  Pulmonary:     Breath sounds: Wheezing present.     Comments: Diminished, faint scattered exp wheezing Musculoskeletal:        General: Normal range of motion.  Skin:    General: Skin is warm and dry.  Neurological:     General: No focal deficit present.     Mental Status: He is alert and oriented to person, place, and time. Mental status is at baseline.  Psychiatric:        Mood and Affect: Mood normal.        Behavior: Behavior normal.        Thought Content: Thought content normal.        Judgment: Judgment normal.      Lab Results:  CBC No results found for: WBC, RBC, HGB, HCT, PLT, MCV, MCH, MCHC, RDW, LYMPHSABS, MONOABS, EOSABS, BASOSABS  BMET No results found for: NA, K, CL, CO2, GLUCOSE, BUN, CREATININE, CALCIUM, GFRNONAA, GFRAA  BNP No results found for: BNP  ProBNP No results found for: PROBNP  Imaging: No results found.   Assessment & Plan:   COPD with chronic bronchitis and emphysema ([place]) - No acute symptoms, however, newly requiring oxygen on exertion  - Audible wheezing t/o lung fields on exam - Patient had sig BD response on PFTs in 2014 - Trial Trelegy 100 1 puff daily (previously on Anoro) - FU in 2 weeks televisit with APP   Chronic respiratory failure with hypoxia ([place]) - 6MWT 03/09/19 at pulmonary rehab showed patient desaturated to 84% RA  - Patient Qualified for POC today, needs 2L to keep O2 >90% - Referral placed to [place] for oxygen needs    Martyn Ehrich, NP 03/30/2019

## 2019-03-30 NOTE — Assessment & Plan Note (Signed)
-   6MWT 03/09/19 at pulmonary rehab showed patient desaturated to 84% RA  - Patient Qualified for POC today, needs 2L to keep O2 >90% - Referral placed to [facility] for oxygen needs

## 2019-03-30 NOTE — Patient Instructions (Addendum)
COPD: - Stop Anoro - 2 week Trial Trelegy - take 1 puffs once daily in the morning (rinse mouth after use)   Oxygen use: - Needs to use 2L oxygen on exertion; monitor O2 level and if <88% notify office or go up to 3L   Orders: - Referral to [facility] for POC  Follow-up: - 2 weeks televisit with Eustaquio Maize, NP  - Due for lung cancer screening CAT scan in June 2021 (following with Eric Form)    Home Oxygen Use, Adult When a medical condition keeps you from getting enough oxygen, your health care provider may instruct you to take extra oxygen at home. Your health care provider will let you know:  When to take oxygen.  For how long to take oxygen.  How quickly oxygen should be delivered (flow rate), in liters per minute (LPM or L/M). Home oxygen can be given through:  A mask.  A nasal cannula. This is a device or tube that goes in the nostrils.  A transtracheal catheter. This is a small, flexible tube placed in the trachea.  A tracheostomy. This is a surgically made opening in the trachea. These devices are connected with tubing to an oxygen source, such as:  A tank. Tanks hold oxygen in gas form. They must be replaced when the oxygen is used up.  A liquid oxygen device. This holds oxygen in liquid form. It must be replaced when the oxygen is used up.  An oxygen concentrator machine. This filters oxygen in the room. It uses electricity, so you must have a backup cylinder of oxygen in case the power goes out. Supplies needed: To use oxygen, you will need:  A mask, nasal cannula, transtracheal catheter, or tracheostomy.  An oxygen tank, a liquid oxygen device, or an oxygen concentrator.  The tape that your health care provider recommends (optional). If you use a transtracheal catheter and your prescribed flow rate is 1 LPM or greater, you will also need a humidifier. Risks and complications  Fire. This can happen if the oxygen is exposed to a heat source, flame, or  spark.  Injury to skin. This can happen if liquid oxygen touches your skin.  Organ damage. This can happen if you get too little oxygen. How to use oxygen Your health care provider or a representative from your [place] will show you how to use your oxygen device. Follow her or his instructions. The instructions may look something like this: 1. Wash your hands. 2. If you use an oxygen concentrator, make sure it is plugged in. 3. Place one end of the tube into the port on the tank, device, or machine. 4. Place the mask over your nose and mouth. Or, place the nasal cannula and secure it with tape if instructed. If you use a tracheostomy or transtracheal catheter, connect it to the oxygen source as directed. 5. Make sure the liter-flow setting on the machine is at the level prescribed by your health care provider. 6. Turn on the machine or adjust the knob on the tank or device to the correct liter-flow setting. 7. When you are done, turn off and unplug the machine, or turn the knob to OFF. How to clean and care for the oxygen supplies Nasal cannula  Clean it with a warm, wet cloth daily or as needed.  Wash it with a liquid soap once a week.  Rinse it thoroughly once or twice a week.  Replace it every 2-4 weeks.  If you have an  infection, such as a cold or pneumonia, change the cannula when you get better. Mask  Replace it every 2-4 weeks.  If you have an infection, such as a cold or pneumonia, change the mask when you get better. Humidifier bottle  Wash the bottle between each refill: ? Wash it with soap and warm water. ? Rinse it thoroughly. ? Disinfect it and its top. ? Air-dry it.  Make sure it is dry before you refill it. Oxygen concentrator  Clean the air filter at least twice a week according to directions from your home medical equipment and service company.  Wipe down the cabinet every day. To do this: ? Unplug the unit. ? Wipe down the cabinet with a  damp cloth. ? Dry the cabinet. Other equipment  Change any extra tubing every 1-3 months.  Follow instructions from your health care provider about taking care of any other equipment. Safety tips Fire safety tips   Keep your oxygen and oxygen supplies at least 5 ft away from sources of heat, flames, and sparks at all times.  Do not allow smoking near your oxygen. Put up "no smoking" signs in your home. Avoid smoking areas when in public.  Do not use materials that can burn (are flammable) while you use oxygen.  When you go to a restaurant with portable oxygen, ask to be seated in the nonsmoking section.  Keep a Data processing manager close by. Let your fire department know that you have oxygen in your home.  Test your home smoke detectors regularly. Traveling  Secure your oxygen tank in the vehicle so that it does not move around. Follow instructions from your medical device company about how to safely secure your tank.  Make sure you have enough oxygen for the amount of time you will be away from home.  If you are planning air travel, contact the airline to find out if they allow the use of an approved portable oxygen concentrator. You may also need documents from your health care provider and medical device company before you travel. General safety tips  If you use an oxygen cylinder, make sure it is in a stand or secured to an object that will not move (fixed object).  If you use liquid oxygen, make sure its container is kept upright.  If you use an oxygen concentrator: ? Dance movement psychotherapist company. Make sure you are given priority service in the event that your power goes out. ? Avoid using extension cords, if possible. Follow these instructions at home:  Use oxygen only as told by your health care provider.  Do not use alcohol or other drugs that make you relax (sedating drugs) unless instructed. They can slow down your breathing rate and make it hard to get in enough  oxygen.  Know how and when to order a refill of oxygen.  Always keep a spare tank of oxygen. Plan ahead for holidays when you may not be able to get a prescription filled.  Use water-based lubricants on your lips or nostrils. Do not use oil-based products like petroleum jelly.  To prevent skin irritation on your cheeks or behind your ears, tuck some gauze under the tubing. Contact a health care provider if:  You get headaches often.  You have shortness of breath.  You have a lasting cough.  You have anxiety.  You are sleepy all the time.  You develop an illness that affects your breathing.  You cannot exercise at your regular level.  You are  restless.  You have difficult or irregular breathing, and it is getting worse.  You have a fever.  You have persistent redness under your nose. Get help right away if:  You are confused.  You have blue lips or fingernails.  You are struggling to breathe. Summary  Your health care provider or a representative from your [place] will show you how to use your oxygen device. Follow her or his instructions.  If you use an oxygen concentrator, make sure it is plugged in.  Make sure the liter-flow setting on the machine is at the level prescribed by your health care provider.  Keep your oxygen and oxygen supplies at least 5 ft away from sources of heat, flames, and sparks at all times. This information is not intended to replace advice given to you by your health care provider. Make sure you discuss any questions you have with your health care provider. Document Revised: 06/13/2017 Document Reviewed: 07/19/2015 Elsevier Patient Education  [place].

## 2019-03-30 NOTE — Addendum Note (Signed)
Addended by: Doroteo Glassman D on: 03/30/2019 11:26 AM   Modules accepted: Orders

## 2019-03-31 ENCOUNTER — Telehealth: Payer: Self-pay | Admitting: Primary Care

## 2019-03-31 ENCOUNTER — Encounter (HOSPITAL_COMMUNITY)
Admission: RE | Admit: 2019-03-31 | Discharge: 2019-03-31 | Disposition: A | Discharge status: Home / Self Care | Payer: PPO | Source: Ambulatory Visit | Attending: Pulmonary Disease | Admitting: Pulmonary Disease

## 2019-03-31 ENCOUNTER — Other Ambulatory Visit: Payer: Self-pay

## 2019-03-31 VITALS — Wt 229.7 lb

## 2019-03-31 DIAGNOSIS — J449 Chronic obstructive pulmonary disease, unspecified: Secondary | ICD-10-CM

## 2019-03-31 NOTE — Progress Notes (Signed)
Daily Session Note  Patient Details  Name: [name] [name] MRN: [id number] Date of Birth: 09/02/1945 Referring Provider:     Pulmonary Rehab Walk Test from 03/09/2019 in [facility]  Referring Provider  Dr [name]      Encounter Date: 03/31/2019  Check In: Session Check In - 03/31/19 1202      Check-In   Supervising physician immediately available to respond to emergencies  [facility] immediately available    Physician(s)  Dr. Darrick Meigs    Location  [facility]    Staff Present  Rosebud Poles, RN, Bjorn Loser, MS, Exercise Physiologist;Dempsey Knotek Ysidro Evert, RN    Virtual Visit  No    Medication changes reported      No    Fall or balance concerns reported     No    Tobacco Cessation  No Change    Warm-up and Cool-down  Performed on first and last piece of equipment    Resistance Training Performed  Yes    VAD Patient?  No    PAD/SET Patient?  No      Pain Assessment   Currently in Pain?  No/denies    Multiple Pain Sites  No       Capillary Blood Glucose: No results found for this or any previous visit (from the past 24 hour(s)).  Exercise Prescription Changes - 03/31/19 1100      Response to Exercise   Blood Pressure (Admit)  140/70    Blood Pressure (Exercise)  160/64    Blood Pressure (Exit)  142/60    Heart Rate (Admit)  96 bpm    Heart Rate (Exercise)  115 bpm    Heart Rate (Exit)  106 bpm    Oxygen Saturation (Admit)  97 %    Oxygen Saturation (Exercise)  92 %    Oxygen Saturation (Exit)  96 %    Rating of Perceived Exertion (Exercise)  13    Perceived Dyspnea (Exercise)  2.5    Duration  Continue with 30 min of aerobic exercise without signs/symptoms of physical distress.    Intensity  THRR unchanged      Progression   Progression  Continue to progress workloads to maintain intensity without signs/symptoms of physical distress.      Resistance Training   Training Prescription  Yes    Weight  blue bands     Reps  10-15    Time  10 Minutes      Oxygen   Oxygen  Continuous    Liters  2      NuStep   Level  3    SPM  80    Minutes  15    METs  2      Arm Ergometer   Level  2    Minutes  15       Social History   Tobacco Use  Smoking Status Former Smoker  . Packs/day: 2.00  . Years: 40.00  . Pack years: 80.00  . Types: Cigarettes  . Quit date: 2010  . Years since quitting: 11.2  Smokeless Tobacco Never Used    Goals Met:  Exercise tolerated well No report of cardiac concerns or symptoms Strength training completed today  Goals Unmet:  Not Applicable  Comments: Service time is from 1045 to 1155    Dr. [name] is Medical Director for Cardiac Rehab at [facility].

## 2019-03-31 NOTE — Telephone Encounter (Signed)
Spoke with pt. States that since calling this morning, his questions have been answered. Nothing further needed.

## 2019-03-31 NOTE — Progress Notes (Signed)
Pulmonary Individual Treatment Plan  Patient Details  Name: [name] [name] MRN: [id number] Date of Birth: [birth year] Referring Provider:     Pulmonary Rehab Walk Test from 03/09/2019 in [place]  Referring Provider  Dr [name]      Initial Encounter Date:    Pulmonary Rehab Walk Test from 03/09/2019 in [place]  Date  03/09/19      Visit Diagnosis: COPD with chronic bronchitis and emphysema ([place])  Patient's Home Medications on Admission:   Current Outpatient Medications:  .  albuterol (PROVENTIL HFA;VENTOLIN HFA) 108 (90 Base) MCG/ACT inhaler, Inhale 2 puffs into the lungs every 6 (six) hours as needed for wheezing or shortness of breath., Disp: 1 Inhaler, Rfl: 2 .  ALPRAZolam (XANAX) 0.25 MG tablet, Take 0.25 mg by mouth daily as needed., Disp: , Rfl:  .  amLODipine (NORVASC) 2.5 MG tablet, Take 5 mg by mouth daily. , Disp: , Rfl:  .  amlodipine-atorvastatin (CADUET) 10-10 MG tablet, Take 1 tablet by mouth daily., Disp: , Rfl:  .  atorvastatin (LIPITOR) 10 MG tablet, Take 10 mg by mouth daily., Disp: , Rfl:  .  Fluticasone-Umeclidin-Vilant (TRELEGY ELLIPTA) 100-62.5-25 MCG/INH AEPB, Inhale 1 puff into the lungs daily., Disp: 14 each, Rfl: 0 .  furosemide (LASIX) 20 MG tablet, Take 20 mg by mouth daily., Disp: , Rfl:  .  ibuprofen (ADVIL,MOTRIN) 200 MG tablet, Take 400 mg by mouth every 6 (six) hours as needed., Disp: , Rfl:  .  loratadine (CLARITIN) 10 MG tablet, Take 10 mg by mouth daily as needed for allergies., Disp: , Rfl:  .  umeclidinium-vilanterol (ANORO ELLIPTA) 62.5-25 MCG/INH AEPB, Inhale 1 puff into the lungs daily., Disp: 2 each, Rfl: 0 .  vitamin B-12 (CYANOCOBALAMIN) 1000 MCG tablet, Take 1,000 mcg by mouth., Disp: , Rfl:   Past Medical History: Past Medical History:  Diagnosis Date  . COPD (chronic obstructive pulmonary disease) (HCC)     Tobacco Use: Social History   Tobacco Use  Smoking  Status Former Smoker  . Packs/day: 2.00  . Years: 40.00  . Pack years: 80.00  . Types: Cigarettes  . Quit date: 2010  . Years since quitting: 11.2  Smokeless Tobacco Never Used    Labs: Recent Review Flowsheet Data    There is no flowsheet data to display.      Capillary Blood Glucose: No results found for: GLUCAP   Pulmonary Assessment Scores: Pulmonary Assessment Scores    Row Name 03/09/19 1006 03/09/19 1041       ADL UCSD   ADL Phase  Entry  Entry    SOB Score total  61  -      CAT Score   CAT Score  15  -      mMRC Score   mMRC Score  -  2      UCSD: Self-administered rating of dyspnea associated with activities of daily living (ADLs) 6-point scale (0 = "not at all" to 5 = "maximal or unable to do because of breathlessness")  Scoring Scores range from 0 to 120.  Minimally important difference is 5 units  CAT: CAT can identify the health impairment of COPD patients and is better correlated with disease progression.  CAT has a scoring range of zero to 40. The CAT score is classified into four groups of low (less than 10), medium (10 - 20), high (21-30) and very high (31-40) based on the impact level of disease  on health status. A CAT score over 10 suggests significant symptoms.  A worsening CAT score could be explained by an exacerbation, poor medication adherence, poor inhaler technique, or progression of COPD or comorbid conditions.  CAT MCID is 2 points  mMRC: mMRC (Modified Medical Research Council) Dyspnea Scale is used to assess the degree of baseline functional disability in patients of respiratory disease due to dyspnea. No minimal important difference is established. A decrease in score of 1 point or greater is considered a positive change.   Pulmonary Function Assessment: Pulmonary Function Assessment - 03/09/19 0953      Breath   Bilateral Breath Sounds  Clear;Decreased    Shortness of Breath  Yes;Limiting activity;Panic with Shortness of Breath        Exercise Target Goals: Exercise Program Goal: Individual exercise prescription set using results from initial 6 min walk test and THRR while considering  patient's activity barriers and safety.   Exercise Prescription Goal: Initial exercise prescription builds to 30-45 minutes a day of aerobic activity, 2-3 days per week.  Home exercise guidelines will be given to patient during program as part of exercise prescription that the participant will acknowledge.  Activity Barriers & Risk Stratification: Activity Barriers & Cardiac Risk Stratification - 03/09/19 0950      Activity Barriers & Cardiac Risk Stratification   Activity Barriers  Joint Problems;Deconditioning;Muscular Weakness;Shortness of Breath       6 Minute Walk: 6 Minute Walk    Row Name 03/09/19 1041         6 Minute Walk   Phase  Initial     Distance  1052 feet     Walk Time  4.83 minutes     # of Rest Breaks  1     MPH  1.99     METS  2.84     RPE  13     Perceived Dyspnea   3     VO2 Peak  9.91     Symptoms  Yes (comment)     Comments  1 min 10 sec standing rest due to desaturation. Recovered on 2L.     Resting HR  96 bpm     Resting BP  131/62     Resting Oxygen Saturation   94 %     Exercise Oxygen Saturation  during 6 min walk  84 %     Max Ex. HR  126 bpm     Max Ex. BP  180/82     2 Minute Post BP  165/83       Interval HR   1 Minute HR  117     2 Minute HR  126     3 Minute HR  126     4 Minute HR  110     5 Minute HR  116     6 Minute HR  126     2 Minute Post HR  106     Interval Heart Rate?  Yes       Interval Oxygen   Interval Oxygen?  Yes     Baseline Oxygen Saturation %  94 %     1 Minute Oxygen Saturation %  93 %     1 Minute Liters of Oxygen  0 L     2 Minute Oxygen Saturation %  88 %     2 Minute Liters of Oxygen  0 L     3 Minute Oxygen Saturation %  84 %  3 Minute Liters of Oxygen  0 L     4 Minute Oxygen Saturation %  92 %     4 Minute Liters of Oxygen  2 L     5  Minute Oxygen Saturation %  95 %     5 Minute Liters of Oxygen  2 L     6 Minute Oxygen Saturation %  91 %     6 Minute Liters of Oxygen  2 L     2 Minute Post Oxygen Saturation %  97 %     2 Minute Post Liters of Oxygen  0 L        Oxygen Initial Assessment: Oxygen Initial Assessment - 03/09/19 1040      Home Oxygen   Home Oxygen Device  None    Sleep Oxygen Prescription  None    Home Exercise Oxygen Prescription  Continuous    Liters per minute  2    Home at Rest Exercise Oxygen Prescription  None    Compliance with Home Oxygen Use  Yes      Initial 6 min Walk   Oxygen Used  Continuous    Liters per minute  2      Program Oxygen Prescription   Program Oxygen Prescription  Continuous    Liters per minute  2      Intervention   Short Term Goals  To learn and exhibit compliance with exercise, home and travel O2 prescription;To learn and understand importance of monitoring SPO2 with pulse oximeter and demonstrate accurate use of the pulse oximeter.;To learn and understand importance of maintaining oxygen saturations>88%;To learn and demonstrate proper pursed lip breathing techniques or other breathing techniques.;To learn and demonstrate proper use of respiratory medications    Long  Term Goals  Exhibits compliance with exercise, home and travel O2 prescription;Verbalizes importance of monitoring SPO2 with pulse oximeter and return demonstration;Maintenance of O2 saturations>88%;Exhibits proper breathing techniques, such as pursed lip breathing or other method taught during program session;Compliance with respiratory medication;Demonstrates proper use of MDI's       Oxygen Re-Evaluation: Oxygen Re-Evaluation    Row Name 03/31/19 0803             Program Oxygen Prescription   Program Oxygen Prescription  Continuous       Liters per minute  2         Home Oxygen   Home Oxygen Device  Portable Concentrator Order just sent to [place]       Sleep Oxygen Prescription  None        Home Exercise Oxygen Prescription  Continuous       Liters per minute  2       Home at Rest Exercise Oxygen Prescription  None       Compliance with Home Oxygen Use  Yes         Goals/Expected Outcomes   Short Term Goals  To learn and exhibit compliance with exercise, home and travel O2 prescription;To learn and understand importance of monitoring SPO2 with pulse oximeter and demonstrate accurate use of the pulse oximeter.;To learn and understand importance of maintaining oxygen saturations>88%;To learn and demonstrate proper pursed lip breathing techniques or other breathing techniques.;To learn and demonstrate proper use of respiratory medications       Long  Term Goals  Exhibits compliance with exercise, home and travel O2 prescription;Verbalizes importance of monitoring SPO2 with pulse oximeter and return demonstration;Maintenance of O2 saturations>88%;Exhibits proper breathing techniques, such as pursed lip  breathing or other method taught during program session;Compliance with respiratory medication;Demonstrates proper use of MDI's       Goals/Expected Outcomes  compliance          Oxygen Discharge (Final Oxygen Re-Evaluation): Oxygen Re-Evaluation - 03/31/19 0803      Program Oxygen Prescription   Program Oxygen Prescription  Continuous    Liters per minute  2      Home Oxygen   Home Oxygen Device  Portable Concentrator   Order just sent to [place]   Sleep Oxygen Prescription  None    Home Exercise Oxygen Prescription  Continuous    Liters per minute  2    Home at Rest Exercise Oxygen Prescription  None    Compliance with Home Oxygen Use  Yes      Goals/Expected Outcomes   Short Term Goals  To learn and exhibit compliance with exercise, home and travel O2 prescription;To learn and understand importance of monitoring SPO2 with pulse oximeter and demonstrate accurate use of the pulse oximeter.;To learn and understand importance of maintaining oxygen saturations>88%;To learn  and demonstrate proper pursed lip breathing techniques or other breathing techniques.;To learn and demonstrate proper use of respiratory medications    Long  Term Goals  Exhibits compliance with exercise, home and travel O2 prescription;Verbalizes importance of monitoring SPO2 with pulse oximeter and return demonstration;Maintenance of O2 saturations>88%;Exhibits proper breathing techniques, such as pursed lip breathing or other method taught during program session;Compliance with respiratory medication;Demonstrates proper use of MDI's    Goals/Expected Outcomes  compliance       Initial Exercise Prescription: Initial Exercise Prescription - 03/09/19 1000      Date of Initial Exercise RX and Referring Provider   Date  03/09/19    Referring Provider  Dr [name]      Oxygen   Oxygen  Continuous    Liters  2      Recumbant Bike   Level  2    RPM  45    Minutes  15      Arm Ergometer   Level  2    Minutes  15      Prescription Details   Frequency (times per week)  2    Duration  Progress to 30 minutes of continuous aerobic without signs/symptoms of physical distress      Intensity   THRR 40-80% of Max Heartrate  60-119    Ratings of Perceived Exertion  11-13    Perceived Dyspnea  0-4      Progression   Progression  Continue progressive overload as per policy without signs/symptoms or physical distress.      Resistance Training   Training Prescription  Yes    Weight  blue bands    Reps  10-15       Perform Capillary Blood Glucose checks as needed.  Exercise Prescription Changes: Exercise Prescription Changes    Row Name 03/17/19 1100             Response to Exercise   Blood Pressure (Admit)  130/74       Blood Pressure (Exercise)  166/72       Blood Pressure (Exit)  142/78       Heart Rate (Admit)  108 bpm       Heart Rate (Exercise)  116 bpm       Heart Rate (Exit)  106 bpm       Oxygen Saturation (Admit)  92 %  Oxygen Saturation (Exercise)  92 %        Oxygen Saturation (Exit)  97 %       Rating of Perceived Exertion (Exercise)  13       Perceived Dyspnea (Exercise)  2       Duration  Continue with 30 min of aerobic exercise without signs/symptoms of physical distress.       Intensity  THRR unchanged         Progression   Progression  Continue to progress workloads to maintain intensity without signs/symptoms of physical distress.         Resistance Training   Training Prescription  Yes       Weight  blue bands       Reps  10-15         Oxygen   Oxygen  Continuous       Liters  2         NuStep   Level  2       SPM  80       Minutes  15       METs  1.9         Arm Ergometer   Level  2       Watts  34       Minutes  15          Exercise Comments:   Exercise Goals and Review: Exercise Goals    Row Name 03/09/19 1102 03/31/19 0804           Exercise Goals   Increase Physical Activity  Yes  Yes      Intervention  Provide advice, education, support and counseling about physical activity/exercise needs.;Develop an individualized exercise prescription for aerobic and resistive training based on initial evaluation findings, risk stratification, comorbidities and participant's personal goals.  Provide advice, education, support and counseling about physical activity/exercise needs.;Develop an individualized exercise prescription for aerobic and resistive training based on initial evaluation findings, risk stratification, comorbidities and participant's personal goals.      Expected Outcomes  Short Term: Attend rehab on a regular basis to increase amount of physical activity.;Long Term: Add in home exercise to make exercise part of routine and to increase amount of physical activity.;Long Term: Exercising regularly at least 3-5 days a week.  Short Term: Attend rehab on a regular basis to increase amount of physical activity.;Long Term: Add in home exercise to make exercise part of routine and to increase amount of physical  activity.;Long Term: Exercising regularly at least 3-5 days a week.      Increase Strength and Stamina  Yes  Yes      Intervention  Provide advice, education, support and counseling about physical activity/exercise needs.;Develop an individualized exercise prescription for aerobic and resistive training based on initial evaluation findings, risk stratification, comorbidities and participant's personal goals.  Provide advice, education, support and counseling about physical activity/exercise needs.;Develop an individualized exercise prescription for aerobic and resistive training based on initial evaluation findings, risk stratification, comorbidities and participant's personal goals.      Expected Outcomes  Short Term: Increase workloads from initial exercise prescription for resistance, speed, and METs.;Short Term: Perform resistance training exercises routinely during rehab and add in resistance training at home;Long Term: Improve cardiorespiratory fitness, muscular endurance and strength as measured by increased METs and functional capacity (6MWT)  Short Term: Increase workloads from initial exercise prescription for resistance, speed, and METs.;Short Term: Perform resistance training exercises routinely during rehab and  add in resistance training at home;Long Term: Improve cardiorespiratory fitness, muscular endurance and strength as measured by increased METs and functional capacity (6MWT)      Able to understand and use rate of perceived exertion (RPE) scale  Yes  Yes      Intervention  Provide education and explanation on how to use RPE scale  Provide education and explanation on how to use RPE scale      Expected Outcomes  Short Term: Able to use RPE daily in rehab to express subjective intensity level;Long Term:  Able to use RPE to guide intensity level when exercising independently  Short Term: Able to use RPE daily in rehab to express subjective intensity level;Long Term:  Able to use RPE to guide  intensity level when exercising independently      Able to understand and use Dyspnea scale  Yes  Yes      Intervention  Provide education and explanation on how to use Dyspnea scale  Provide education and explanation on how to use Dyspnea scale      Expected Outcomes  Short Term: Able to use Dyspnea scale daily in rehab to express subjective sense of shortness of breath during exertion;Long Term: Able to use Dyspnea scale to guide intensity level when exercising independently  Short Term: Able to use Dyspnea scale daily in rehab to express subjective sense of shortness of breath during exertion;Long Term: Able to use Dyspnea scale to guide intensity level when exercising independently      Knowledge and understanding of Target Heart Rate Range (THRR)  Yes  Yes      Intervention  Provide education and explanation of THRR including how the numbers were predicted and where they are located for reference  Provide education and explanation of THRR including how the numbers were predicted and where they are located for reference      Expected Outcomes  Short Term: Able to state/look up THRR;Short Term: Able to use daily as guideline for intensity in rehab;Long Term: Able to use THRR to govern intensity when exercising independently  Short Term: Able to state/look up THRR;Short Term: Able to use daily as guideline for intensity in rehab;Long Term: Able to use THRR to govern intensity when exercising independently      Understanding of Exercise Prescription  Yes  Yes      Intervention  Provide education, explanation, and written materials on patient's individual exercise prescription  Provide education, explanation, and written materials on patient's individual exercise prescription      Expected Outcomes  Short Term: Able to explain program exercise prescription;Long Term: Able to explain home exercise prescription to exercise independently  Short Term: Able to explain program exercise prescription;Long Term:  Able to explain home exercise prescription to exercise independently         Exercise Goals Re-Evaluation : Exercise Goals Re-Evaluation    Row Name 03/31/19 0804             Exercise Goal Re-Evaluation   Exercise Goals Review  Increase Physical Activity;Increase Strength and Stamina;Able to understand and use rate of perceived exertion (RPE) scale;Able to understand and use Dyspnea scale;Knowledge and understanding of Target Heart Rate Range (THRR);Understanding of Exercise Prescription       Comments  Pt has completed 4 exercise sessions. He was recently approved for a POC at home, so he will be able to safely exercise at home. Pt is motivated and is progressing slowly. He currently exercises at 2.0 METs on the stepper. Will  continue to monitor and progress as able.       Expected Outcomes  Through exercise at rehab and at home, the patient will decrease shortness of breath with daily activities and feel confident in carrying out an exercise regime at home.          Discharge Exercise Prescription (Final Exercise Prescription Changes): Exercise Prescription Changes - 03/17/19 1100      Response to Exercise   Blood Pressure (Admit)  130/74    Blood Pressure (Exercise)  166/72    Blood Pressure (Exit)  142/78    Heart Rate (Admit)  108 bpm    Heart Rate (Exercise)  116 bpm    Heart Rate (Exit)  106 bpm    Oxygen Saturation (Admit)  92 %    Oxygen Saturation (Exercise)  92 %    Oxygen Saturation (Exit)  97 %    Rating of Perceived Exertion (Exercise)  13    Perceived Dyspnea (Exercise)  2    Duration  Continue with 30 min of aerobic exercise without signs/symptoms of physical distress.    Intensity  THRR unchanged      Progression   Progression  Continue to progress workloads to maintain intensity without signs/symptoms of physical distress.      Resistance Training   Training Prescription  Yes    Weight  blue bands    Reps  10-15      Oxygen   Oxygen  Continuous     Liters  2      NuStep   Level  2    SPM  80    Minutes  15    METs  1.9      Arm Ergometer   Level  2    Watts  34    Minutes  15       Nutrition:  Target Goals: Understanding of nutrition guidelines, daily intake of sodium 1500mg , cholesterol 200mg , calories 30% from fat and 7% or less from saturated fats, daily to have 5 or more servings of fruits and vegetables.  Biometrics: Pre Biometrics - 03/09/19 0952      Pre Biometrics   Height  5\' 10"  (1.778 m)    Weight  104.1 kg    BMI (Calculated)  32.93    Grip Strength  27 kg        Nutrition Therapy Plan and Nutrition Goals: Nutrition Therapy & Goals - 03/19/19 1201      Nutrition Therapy   Diet  low Sodium      Personal Nutrition Goals   Nutrition Goal  Pt to identify and limit food sources of saturated fat, trans fat, refined carbohydrates and sodium    Personal Goal #2  Pt to begin reading labels      Intervention Plan   Intervention  Prescribe, educate and counsel regarding individualized specific dietary modifications aiming towards targeted core components such as weight, hypertension, lipid management, diabetes, heart failure and other comorbidities.    Expected Outcomes  Short Term Goal: A plan has been developed with personal nutrition goals set during dietitian appointment.       Nutrition Assessments: Nutrition Assessments - 03/19/19 1202      Rate Your Plate Scores   Pre Score  44       Nutrition Goals Re-Evaluation: Nutrition Goals Re-Evaluation    Row Name 03/19/19 1202             Goals   Current Weight  228 lb (103.4  kg)       Nutrition Goal  Pt to identify and limit food sources of saturated fat, trans fat, refined carbohydrates and sodium         Personal Goal #2 Re-Evaluation   Personal Goal #2  Pt to begin reading labels          Nutrition Goals Discharge (Final Nutrition Goals Re-Evaluation): Nutrition Goals Re-Evaluation - 03/19/19 1202      Goals   Current Weight  228  lb (103.4 kg)    Nutrition Goal  Pt to identify and limit food sources of saturated fat, trans fat, refined carbohydrates and sodium      Personal Goal #2 Re-Evaluation   Personal Goal #2  Pt to begin reading labels       Psychosocial: Target Goals: Acknowledge presence or absence of significant depression and/or stress, maximize coping skills, provide positive support system. Participant is able to verbalize types and ability to use techniques and skills needed for reducing stress and depression.  Initial Review & Psychosocial Screening: Initial Psych Review & Screening - 03/09/19 1013      Initial Review   Current issues with  Current Anxiety/Panic;Current Stress Concerns  (Pended)     Source of Stress Concerns  Family  (Pended)    Current stress with his son, is being treated for anxiety     Family Dynamics   Good Support System?  Yes  (Pended)        Quality of Life Scores:  Scores of 19 and below usually indicate a poorer quality of life in these areas.  A difference of  2-3 points is a clinically meaningful difference.  A difference of 2-3 points in the total score of the Quality of Life Index has been associated with significant improvement in overall quality of life, self-image, physical symptoms, and general health in studies assessing change in quality of life.  PHQ-9: Recent Review Flowsheet Data    Depression screen [facility] 2/9 03/09/2019   Decreased Interest 0   Down, Depressed, Hopeless 0   PHQ - 2 Score 0   Altered sleeping 0   Tired, decreased energy 0   Change in appetite 0   Feeling bad or failure about yourself  0   Moving slowly or fidgety/restless 0   Suicidal thoughts 0   PHQ-9 Score 0   Difficult doing work/chores Not difficult at all     Interpretation of Total Score  Total Score Depression Severity:  1-4 = Minimal depression, 5-9 = Mild depression, 10-14 = Moderate depression, 15-19 = Moderately severe depression, 20-27 = Severe depression    Psychosocial Evaluation and Intervention: Psychosocial Evaluation - 03/09/19 1056      Psychosocial Evaluation & Interventions   Interventions  Encouraged to exercise with the program and follow exercise prescription;Stress management education    Comments  Patient has stress and anxiety issues with his son    Expected Outcomes  For patient to manage stress in a healthy manner    Continue Psychosocial Services   Follow up required by staff       Psychosocial Re-Evaluation: Psychosocial Re-Evaluation    Letts Name 03/30/19 1327             Psychosocial Re-Evaluation   Current issues with  Current Anxiety/Panic;Current Stress Concerns       Comments  [name] [name] just started the program, he has current stress and anxiety re: his relationship with his son.  He will be provided with stress management and  relaxation materials while in the program.       Expected Outcomes  For [name] [name] to manage his stress/anxiety in healthy ways.       Interventions  Encouraged to attend Pulmonary Rehabilitation for the exercise;Relaxation education;Stress management education       Continue Psychosocial Services   Follow up required by staff         Initial Review   Source of Stress Concerns  Family          Psychosocial Discharge (Final Psychosocial Re-Evaluation): Psychosocial Re-Evaluation - 03/30/19 1327      Psychosocial Re-Evaluation   Current issues with  Current Anxiety/Panic;Current Stress Concerns    Comments  [name] [name] just started the program, he has current stress and anxiety re: his relationship with his son.  He will be provided with stress management and relaxation materials while in the program.    Expected Outcomes  For [name] [name] to manage his stress/anxiety in healthy ways.    Interventions  Encouraged to attend Pulmonary Rehabilitation for the exercise;Relaxation education;Stress management education    Continue Psychosocial Services   Follow up required by staff      Initial Review   Source of  Stress Concerns  Family       Education: Education Goals: Education classes will be provided on a weekly basis, covering required topics. Participant will state understanding/return demonstration of topics presented.  Learning Barriers/Preferences: Learning Barriers/Preferences - 03/09/19 1101      Learning Barriers/Preferences   Learning Barriers  None    Learning Preferences  Audio;Computer/Internet;Group Instruction;Individual Instruction;Pictoral;Skilled Demonstration;Verbal Instruction;Video;Written Material       Education Topics: Risk Factor Reduction:  -Group instruction that is supported by a PowerPoint presentation. Instructor discusses the definition of a risk factor, different risk factors for pulmonary disease, and how the heart and lungs work together.     Nutrition for Pulmonary Patient:  -Group instruction provided by PowerPoint slides, verbal discussion, and written materials to support subject matter. The instructor gives an explanation and review of healthy diet recommendations, which includes a discussion on weight management, recommendations for fruit and vegetable consumption, as well as protein, fluid, caffeine, fiber, sodium, sugar, and alcohol. Tips for eating when patients are short of breath are discussed.   Pursed Lip Breathing:  -Group instruction that is supported by demonstration and informational handouts. Instructor discusses the benefits of pursed lip and diaphragmatic breathing and detailed demonstration on how to preform both.     Oxygen Safety:  -Group instruction provided by PowerPoint, verbal discussion, and written material to support subject matter. There is an overview of "What is Oxygen" and "Why do we need it".  Instructor also reviews how to create a safe environment for oxygen use, the importance of using oxygen as prescribed, and the risks of noncompliance. There is a brief discussion on traveling with oxygen and resources the patient may  utilize.   Oxygen Equipment:  -Group instruction provided by [facility] Staff utilizing handouts, written materials, and equipment demonstrations.   Signs and Symptoms:  -Group instruction provided by written material and verbal discussion to support subject matter. Warning signs and symptoms of infection, stroke, and heart attack are reviewed and when to call the physician/911 reinforced. Tips for preventing the spread of infection discussed.   Advanced Directives:  -Group instruction provided by verbal instruction and written material to support subject matter. Instructor reviews Advanced Directive laws and proper instruction for filling out document.   Pulmonary Video:  -Group video education that reviews  the importance of medication and oxygen compliance, exercise, good nutrition, pulmonary hygiene, and pursed lip and diaphragmatic breathing for the pulmonary patient.   Exercise for the Pulmonary Patient:  -Group instruction that is supported by a PowerPoint presentation. Instructor discusses benefits of exercise, core components of exercise, frequency, duration, and intensity of an exercise routine, importance of utilizing pulse oximetry during exercise, safety while exercising, and options of places to exercise outside of rehab.     Pulmonary Medications:  -Verbally interactive group education provided by instructor with focus on inhaled medications and proper administration.   Anatomy and Physiology of the Respiratory System and Intimacy:  -Group instruction provided by PowerPoint, verbal discussion, and written material to support subject matter. Instructor reviews respiratory cycle and anatomical components of the respiratory system and their functions. Instructor also reviews differences in obstructive and restrictive respiratory diseases with examples of each. Intimacy, Sex, and Sexuality differences are reviewed with a discussion on how relationships can change when diagnosed  with pulmonary disease. Common sexual concerns are reviewed.   PULMONARY REHAB CHRONIC OBSTRUCTIVE PULMONARY DISEASE from 03/19/2019 in [place]  Date  03/19/19  Educator  handout  Instruction Review Code  -- [na]      MD DAY -A group question and answer session with a medical doctor that allows participants to ask questions that relate to their pulmonary disease state.   OTHER EDUCATION -Group or individual verbal, written, or video instructions that support the educational goals of the pulmonary rehab program.   Holiday Eating Survival Tips:  -Group instruction provided by PowerPoint slides, verbal discussion, and written materials to support subject matter. The instructor gives patients tips, tricks, and techniques to help them not only survive but enjoy the holidays despite the onslaught of food that accompanies the holidays.   Knowledge Questionnaire Score: Knowledge Questionnaire Score - 03/09/19 1012      Knowledge Questionnaire Score   Pre Score  15/18       Core Components/Risk Factors/Patient Goals at Admission: Personal Goals and Risk Factors at Admission - 03/09/19 1102      Core Components/Risk Factors/Patient Goals on Admission   Improve shortness of breath with ADL's  Yes    Intervention  Provide education, individualized exercise plan and daily activity instruction to help decrease symptoms of SOB with activities of daily living.    Expected Outcomes  Short Term: Improve cardiorespiratory fitness to achieve a reduction of symptoms when performing ADLs;Long Term: Be able to perform more ADLs without symptoms or delay the onset of symptoms    Stress  Yes    Intervention  --   in hopes that exercise will help relieve stress      Core Components/Risk Factors/Patient Goals Review:  Goals and Risk Factor Review    Row Name 03/09/19 1103 03/30/19 1330           Core Components/Risk Factors/Patient Goals Review   Personal Goals  Review  Develop more efficient breathing techniques such as purse lipped breathing and diaphragmatic breathing and practicing self-pacing with activity.;Increase knowledge of respiratory medications and ability to use respiratory devices properly.;Improve shortness of breath with ADL's;Stress;Lipids  Develop more efficient breathing techniques such as purse lipped breathing and diaphragmatic breathing and practicing self-pacing with activity.;Increase knowledge of respiratory medications and ability to use respiratory devices properly.;Improve shortness of breath with ADL's      Review  -  [name] [name] has attended 4 exercise sessions and has required supplemental oxygen @ 2L/min.  He  has been reluctant to have his doctor contact a DME to provide this.  He has investigated portable oxygen concentrators on his own to decide which machine he would like to order.  I feel he is closer to accepting that he requires supplemental oxygen.      Expected Outcomes  -  See admission goals.         Core Components/Risk Factors/Patient Goals at Discharge (Final Review):  Goals and Risk Factor Review - 03/30/19 1330      Core Components/Risk Factors/Patient Goals Review   Personal Goals Review  Develop more efficient breathing techniques such as purse lipped breathing and diaphragmatic breathing and practicing self-pacing with activity.;Increase knowledge of respiratory medications and ability to use respiratory devices properly.;Improve shortness of breath with ADL's    Review  [name] [name] has attended 4 exercise sessions and has required supplemental oxygen @ 2L/min.  He has been reluctant to have his doctor contact a DME to provide this.  He has investigated portable oxygen concentrators on his own to decide which machine he would like to order.  I feel he is closer to accepting that he requires supplemental oxygen.    Expected Outcomes  See admission goals.       ITP Comments:   Comments: ITP REVIEW Pt is making expected  progress toward pulmonary rehab goals after completing 4 sessions. Recommend continued exercise, life style modification, education, and utilization of breathing techniques to increase stamina and strength and decrease shortness of breath with exertion.

## 2019-04-01 DIAGNOSIS — J449 Chronic obstructive pulmonary disease, unspecified: Secondary | ICD-10-CM | POA: Diagnosis not present

## 2019-04-01 DIAGNOSIS — J439 Emphysema, unspecified: Secondary | ICD-10-CM | POA: Diagnosis not present

## 2019-04-01 DIAGNOSIS — J9611 Chronic respiratory failure with hypoxia: Secondary | ICD-10-CM | POA: Diagnosis not present

## 2019-04-01 DIAGNOSIS — J42 Unspecified chronic bronchitis: Secondary | ICD-10-CM | POA: Diagnosis not present

## 2019-04-02 ENCOUNTER — Encounter (HOSPITAL_COMMUNITY)
Admission: RE | Admit: 2019-04-02 | Discharge: 2019-04-02 | Disposition: A | Discharge status: Home / Self Care | Payer: PPO | Source: Ambulatory Visit | Attending: Pulmonary Disease | Admitting: Pulmonary Disease

## 2019-04-02 ENCOUNTER — Other Ambulatory Visit: Payer: Self-pay

## 2019-04-02 DIAGNOSIS — J449 Chronic obstructive pulmonary disease, unspecified: Secondary | ICD-10-CM

## 2019-04-02 NOTE — Progress Notes (Signed)
Daily Session Note  Patient Details  Name: [name] [name] MRN: [id number] Date of Birth: [birth year] Referring Provider:     Pulmonary Rehab Walk Test from 03/09/2019 in [place]  Referring Provider  Dr [name]      Encounter Date: 04/02/2019  Check In: Session Check In - 04/02/19 1154      Check-In   Supervising physician immediately available to respond to emergencies  [facility] immediately available    Physician(s)  Dr. Marylyn Ishihara    Location  [facility]    Staff Present  Hoy Register, MS, Exercise Physiologist;Vershawn Westrup Leonia Reeves, RN, BSN    Virtual Visit  No    Medication changes reported      No    Fall or balance concerns reported     No    Tobacco Cessation  No Change    Warm-up and Cool-down  Performed as group-led instruction    Resistance Training Performed  Yes    VAD Patient?  No    PAD/SET Patient?  No      Pain Assessment   Currently in Pain?  No/denies    Multiple Pain Sites  No       Capillary Blood Glucose: No results found for this or any previous visit (from the past 24 hour(s)).    Social History   Tobacco Use  Smoking Status Former Smoker  . Packs/day: 2.00  . Years: 40.00  . Pack years: 80.00  . Types: Cigarettes  . Quit date: 2010  . Years since quitting: 11.2  Smokeless Tobacco Never Used    Goals Met:  Proper associated with RPD/PD & O2 Sat Exercise tolerated well Strength training completed today  Goals Unmet:  Not Applicable  Comments: Service time is from 1040 to 1140    Dr. [name] is Medical Director for Cardiac Rehab at [facility].

## 2019-04-07 ENCOUNTER — Encounter (HOSPITAL_COMMUNITY)
Admission: RE | Admit: 2019-04-07 | Discharge: 2019-04-07 | Disposition: A | Discharge status: Home / Self Care | Payer: PPO | Source: Ambulatory Visit | Attending: Pulmonary Disease | Admitting: Pulmonary Disease

## 2019-04-07 ENCOUNTER — Other Ambulatory Visit: Payer: Self-pay

## 2019-04-07 DIAGNOSIS — J449 Chronic obstructive pulmonary disease, unspecified: Secondary | ICD-10-CM

## 2019-04-07 NOTE — Progress Notes (Signed)
Daily Session Note  Patient Details  Name: [name] [name] MRN: [id number] Date of Birth: Apr 20, 1945 Referring Provider:     Pulmonary Rehab Walk Test from 03/09/2019 in [place]  Referring Provider  Dr [name]      Encounter Date: 04/07/2019  Check In: Session Check In - 04/07/19 1142      Check-In   Supervising physician immediately available to respond to emergencies  [facility] immediately available    Physician(s)  Dr. Sharlet Salina    Location  [facility]    Staff Present  Rosebud Poles, RN, Bjorn Loser, MS, Exercise Physiologist;Carmelia Tiner Ysidro Evert, RN    Virtual Visit  No    Medication changes reported      No    Fall or balance concerns reported     No    Tobacco Cessation  No Change    Warm-up and Cool-down  Performed as group-led instruction    Resistance Training Performed  Yes    VAD Patient?  No    PAD/SET Patient?  No      Pain Assessment   Currently in Pain?  No/denies    Multiple Pain Sites  No       Capillary Blood Glucose: No results found for this or any previous visit (from the past 24 hour(s)).    Social History   Tobacco Use  Smoking Status Former Smoker  . Packs/day: 2.00  . Years: 40.00  . Pack years: 80.00  . Types: Cigarettes  . Quit date: 2010  . Years since quitting: 11.2  Smokeless Tobacco Never Used    Goals Met:  Exercise tolerated well No report of cardiac concerns or symptoms Strength training completed today  Goals Unmet:  Not Applicable  Comments: Service time is from 1045 to 1145    Dr. [name] is Medical Director for Cardiac Rehab at [facility]

## 2019-04-09 ENCOUNTER — Other Ambulatory Visit: Payer: Self-pay

## 2019-04-09 ENCOUNTER — Encounter (HOSPITAL_COMMUNITY)
Admission: RE | Admit: 2019-04-09 | Discharge: 2019-04-09 | Disposition: A | Discharge status: Home / Self Care | Payer: PPO | Source: Ambulatory Visit | Attending: Pulmonary Disease | Admitting: Pulmonary Disease

## 2019-04-09 DIAGNOSIS — J449 Chronic obstructive pulmonary disease, unspecified: Secondary | ICD-10-CM | POA: Insufficient documentation

## 2019-04-09 NOTE — Progress Notes (Signed)
Daily Session Note  Patient Details  Name: [name] [name] MRN: [id number] Date of Birth: 05-16-1945 Referring Provider:     Pulmonary Rehab Walk Test from 03/09/2019 in [place]  Referring Provider  Dr [name]      Encounter Date: 04/09/2019  Check In: Session Check In - 04/09/19 1122      Check-In   Supervising physician immediately available to respond to emergencies  [facility] immediately available    Physician(s)  Dr. Loleta Books    Location  [facility]    Staff Present  Rosebud Poles, RN, Bjorn Loser, MS, Exercise Physiologist;Ann Marmo Ysidro Evert, RN    Virtual Visit  No    Medication changes reported      No    Fall or balance concerns reported     No    Tobacco Cessation  No Change    Warm-up and Cool-down  Performed as group-led instruction    Resistance Training Performed  Yes    VAD Patient?  No    PAD/SET Patient?  No      Pain Assessment   Currently in Pain?  No/denies    Multiple Pain Sites  No       Capillary Blood Glucose: No results found for this or any previous visit (from the past 24 hour(s)).    Social History   Tobacco Use  Smoking Status Former Smoker  . Packs/day: 2.00  . Years: 40.00  . Pack years: 80.00  . Types: Cigarettes  . Quit date: 2010  . Years since quitting: 11.2  Smokeless Tobacco Never Used    Goals Met:  Exercise tolerated well No report of cardiac concerns or symptoms Strength training completed today  Goals Unmet:  Not Applicable  Comments: Service time is from 1030 to 1135    Dr. [name] is Medical Director for Cardiac Rehab at [facility].

## 2019-04-09 NOTE — Progress Notes (Signed)
I have reviewed a Home Exercise Prescription with [name] [name] . Ousman is  currently exercising at home.  The patient was advised to walk 3 days a week for 30-45 minutes.  Trilby Drummer and I discussed how to progress their exercise prescription.  The patient stated that their goals were to improve stamina.  The patient stated that they understand the exercise prescription.  We reviewed exercise guidelines, target heart rate during exercise, RPE Scale, weather conditions, NTG use, endpoints for exercise, warmup and cool down.  Patient is encouraged to come to me with any questions. I will continue to follow up with the patient to assist them with progression and safety.

## 2019-04-13 ENCOUNTER — Other Ambulatory Visit: Payer: Self-pay

## 2019-04-13 ENCOUNTER — Ambulatory Visit (INDEPENDENT_AMBULATORY_CARE_PROVIDER_SITE_OTHER): Payer: PPO | Admitting: Primary Care

## 2019-04-13 ENCOUNTER — Encounter: Payer: Self-pay | Admitting: Primary Care

## 2019-04-13 DIAGNOSIS — J449 Chronic obstructive pulmonary disease, unspecified: Secondary | ICD-10-CM

## 2019-04-13 DIAGNOSIS — J9611 Chronic respiratory failure with hypoxia: Secondary | ICD-10-CM

## 2019-04-13 MED ORDER — TRELEGY ELLIPTA 100-62.5-25 MCG/INH IN AEPB: 1.0000 | INHALATION_SPRAY | Freq: Every day | RESPIRATORY_TRACT | 3 refills | Status: DC

## 2019-04-13 NOTE — Patient Instructions (Addendum)
Recommendations: - Continue Trelegy 1 puff daily (rinse mouth after use) - Continue albuterol 2 puffs every 4-6 hours as needed for shortness of breath or wheezing    Follow-up: - Change August 24 appointment from [place] to [facility] scheduled

## 2019-04-13 NOTE — Progress Notes (Signed)
Virtual Visit via Telephone Note  I connected with [name] [name] on 04/13/19 at  9:30 AM EDT by telephone and verified that I am speaking with the correct person using two identifiers.  Location: Patient: Home Provider: Home   I discussed the limitations, risks, security and privacy concerns of performing an evaluation and management service by telephone and the availability of in person appointments. I also discussed with the patient that there may be a patient responsible charge related to this service. The patient expressed understanding and agreed to proceed.   History of Present Illness:  74 year old male, former smoker. 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 Patient of Dr. [name], last had televisit with pulmonary NP on 03/13/19.  Following with lung cancer screening program. LDCT June 2020 showed lung-RADS 2. 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 Maintained on Anoro.  03/30/2019 Patient presents today for follow-up visit. He is here to talk about starting oxygen. He had a 6 minute walk test at pulmonary rehab on March 1st and he desaturated to 84%. He now wears oxygen while exercising at pulmonary rehab. He has a pulse oximeter at home and states that his oxygen level will drop into the mid-80s on exertion but he recovers quickly with rest. He is compliant with Anoro once daily, occasionally uses his albuterol rescue inhaler before an activity. He has had some anxiety with hyperventilation. He spoke with PCP and he was given prescription for 10 tabs of xanax which lasted him 2-3 more months. Compliant with 20mg  lasix daily, leg swelling is at baseline. His weight typically is between 228-230 at home. He has received both pfizer covid vaccines. [facility] is in network, he is interested in [place]. He denies acute symptoms; no cough, chest tightness of wheezing.                        04/13/2019 Patient contacted today for 2 week follow-up. Started on Trelegy during last visit, given  two week sample. States that his breathing is some better and not worse. He has ups and downs. He would like to continue with Trelegy, prescription is cheaper than Anoro for 3 months supply. No issues with oxygen. Continues 2L on exertion. Attending pulmonary rehab. No current URI symptoms, wheezing, cough.   Observations/Objective:  - Appears well. Able to speak in full sentences without sob, cough or wheezing  Assessment and Plan:  COPD: - Stable/improved some - Continue Trelegy (3 months supply sent to pharmacy) - Continue albuterol 2 puffs every 4-6 hours as needed for shortness of breath or wheezing  - Continue pulmonary rehab - Due for lung cancer screening CT scan in June 2021 (following with Eric Form)  Follow Up Instructions:   - August 24th with [name] NP  I discussed the assessment and treatment plan with the patient. The patient was provided an opportunity to ask questions and all were answered. The patient agreed with the plan and demonstrated an understanding of the instructions.   The patient was advised to call back or seek an in-person evaluation if the symptoms worsen or if the condition fails to improve as anticipated.  I provided 18 minutes of non-face-to-face time during this encounter.   Martyn Ehrich, NP

## 2019-04-14 ENCOUNTER — Other Ambulatory Visit: Payer: Self-pay

## 2019-04-14 ENCOUNTER — Telehealth: Payer: Self-pay | Admitting: Primary Care

## 2019-04-14 ENCOUNTER — Encounter (HOSPITAL_COMMUNITY)
Admission: RE | Admit: 2019-04-14 | Discharge: 2019-04-14 | Disposition: A | Discharge status: Home / Self Care | Payer: PPO | Source: Ambulatory Visit | Attending: Pulmonary Disease | Admitting: Pulmonary Disease

## 2019-04-14 VITALS — Wt 226.4 lb

## 2019-04-14 DIAGNOSIS — J449 Chronic obstructive pulmonary disease, unspecified: Secondary | ICD-10-CM

## 2019-04-14 NOTE — Telephone Encounter (Signed)
Please change patient's appointment August to [facility] NP

## 2019-04-14 NOTE — Progress Notes (Signed)
Daily Session Note  Patient Details  Name: [name] [name] MRN: [id number] Date of Birth: [birth year] Referring Provider:     Pulmonary Rehab Walk Test from 03/09/2019 in [place]  Referring Provider  Dr [name]      Encounter Date: 04/14/2019  Check In: Session Check In - 04/14/19 1126      Check-In   Supervising physician immediately available to respond to emergencies  [facility] immediately available    Physician(s)  Dr. Broadus John    Location  [facility]    Staff Present  Rosebud Poles, RN, Bjorn Loser, MS, Exercise Physiologist;Lisa Ysidro Evert, RN    Virtual Visit  No    Medication changes reported      No    Fall or balance concerns reported     No    Tobacco Cessation  No Change    Warm-up and Cool-down  Performed as group-led instruction    Resistance Training Performed  Yes    VAD Patient?  No    PAD/SET Patient?  No      Pain Assessment   Currently in Pain?  No/denies    Multiple Pain Sites  No       Capillary Blood Glucose: No results found for this or any previous visit (from the past 24 hour(s)).  Exercise Prescription Changes - 04/14/19 1200      Response to Exercise   Blood Pressure (Admit)  160/80    Blood Pressure (Exercise)  170/88    Blood Pressure (Exit)  144/72    Heart Rate (Admit)  97 bpm    Heart Rate (Exercise)  115 bpm    Heart Rate (Exit)  98 bpm    Oxygen Saturation (Admit)  97 %    Oxygen Saturation (Exercise)  90 %    Oxygen Saturation (Exit)  94 %    Rating of Perceived Exertion (Exercise)  13    Perceived Dyspnea (Exercise)  3    Duration  Continue with 30 min of aerobic exercise without signs/symptoms of physical distress.    Intensity  THRR unchanged      Progression   Progression  Continue to progress workloads to maintain intensity without signs/symptoms of physical distress.      Resistance Training   Training Prescription  Yes    Weight  blue bands    Reps  10-15     Time  10 Minutes      Oxygen   Oxygen  Continuous    Liters  2      NuStep   Level  3    SPM  80    Minutes  15    METs  2      Arm Ergometer   Level  2.5    Minutes  15       Social History   Tobacco Use  Smoking Status Former Smoker  . Packs/day: 2.00  . Years: 40.00  . Pack years: 80.00  . Types: Cigarettes  . Quit date: 2010  . Years since quitting: 11.2  Smokeless Tobacco Never Used    Goals Met:  Independence with exercise equipment Exercise tolerated well Strength training completed today  Goals Unmet:  Not Applicable  Comments: Service time is from 1030 to 1125    Dr. [name] is Medical Director for Cardiac Rehab at [facility].

## 2019-04-14 NOTE — Telephone Encounter (Signed)
Appt changed to [name] on 09/01/2019 at 9:00 am

## 2019-04-16 ENCOUNTER — Encounter (HOSPITAL_COMMUNITY)
Admission: RE | Admit: 2019-04-16 | Discharge: 2019-04-16 | Disposition: A | Discharge status: Home / Self Care | Payer: PPO | Source: Ambulatory Visit | Attending: Pulmonary Disease | Admitting: Pulmonary Disease

## 2019-04-16 ENCOUNTER — Other Ambulatory Visit: Payer: Self-pay

## 2019-04-16 DIAGNOSIS — J449 Chronic obstructive pulmonary disease, unspecified: Secondary | ICD-10-CM | POA: Diagnosis not present

## 2019-04-16 NOTE — Progress Notes (Signed)
Daily Session Note  Patient Details  Name: [name] [name] MRN: [id number] Date of Birth: 05/31/1945 Referring Provider:     Pulmonary Rehab Walk Test from 03/09/2019 in [facility]  Referring Provider  Dr Alva      Encounter Date: 04/16/2019  Check In: Session Check In - 04/16/19 1144      Check-In   Supervising physician immediately available to respond to emergencies  [facility] immediately available    Physician(s)  Dr. Joseph    Location  [facility]    Staff Present  Joan Behrens, RN, BSN;Lisa Hughes, RN;Dalton Fletcher, MS, Exercise Physiologist    Virtual Visit  No    Medication changes reported      No    Fall or balance concerns reported     No    Tobacco Cessation  No Change    Warm-up and Cool-down  Performed as group-led instruction    Resistance Training Performed  Yes    VAD Patient?  No    PAD/SET Patient?  No      Pain Assessment   Currently in Pain?  No/denies    Multiple Pain Sites  No       Capillary Blood Glucose: No results found for this or any previous visit (from the past 24 hour(s)).    Social History   Tobacco Use  Smoking Status Former Smoker  . Packs/day: 2.00  . Years: 40.00  . Pack years: 80.00  . Types: Cigarettes  . Quit date: 2010  . Years since quitting: 11.2  Smokeless Tobacco Never Used    Goals Met:  Proper associated with RPD/PD & O2 Sat Exercise tolerated well Strength training completed today  Goals Unmet:  Not Applicable  Comments: Service time is from 1030 to 1120    Dr. Traci Turner is Medical Director for Cardiac Rehab at  Hospital. 

## 2019-04-21 ENCOUNTER — Encounter (HOSPITAL_COMMUNITY): Payer: PPO

## 2019-04-23 ENCOUNTER — Encounter (HOSPITAL_COMMUNITY)
Admission: RE | Admit: 2019-04-23 | Discharge: 2019-04-23 | Disposition: A | Discharge status: Home / Self Care | Payer: PPO | Source: Ambulatory Visit | Attending: Pulmonary Disease | Admitting: Pulmonary Disease

## 2019-04-23 ENCOUNTER — Other Ambulatory Visit: Payer: Self-pay

## 2019-04-23 DIAGNOSIS — J449 Chronic obstructive pulmonary disease, unspecified: Secondary | ICD-10-CM

## 2019-04-23 NOTE — Progress Notes (Signed)
Daily Session Note  Patient Details  Name: [name] [name] MRN: [id number] Date of Birth: 1945-11-10 Referring Provider:     Pulmonary Rehab Walk Test from 03/09/2019 in [place]  Referring Provider  Dr [name]      Encounter Date: 04/23/2019  Check In: Session Check In - 04/23/19 1138      Check-In   Supervising physician immediately available to respond to emergencies  [facility] immediately available    Physician(s)  Dr. [name]    Location  [facility]    Staff Present  Rosebud Poles, RN, Bjorn Loser, MS, Exercise Physiologist;Dyllin Gulley Ysidro Evert, RN    Virtual Visit  No    Medication changes reported      No    Fall or balance concerns reported     No    Tobacco Cessation  No Change    Warm-up and Cool-down  Performed as group-led instruction    Resistance Training Performed  Yes    VAD Patient?  No    PAD/SET Patient?  No      Pain Assessment   Currently in Pain?  No/denies    Multiple Pain Sites  No       Capillary Blood Glucose: No results found for this or any previous visit (from the past 24 hour(s)).    Social History   Tobacco Use  Smoking Status Former Smoker  . Packs/day: 2.00  . Years: 40.00  . Pack years: 80.00  . Types: Cigarettes  . Quit date: 2010  . Years since quitting: 11.2  Smokeless Tobacco Never Used    Goals Met:  Exercise tolerated well No report of cardiac concerns or symptoms Strength training completed today  Goals Unmet:  Not Applicable  Comments: Service time is from 1030 to 1130    Dr. [name] is Medical Director for Cardiac Rehab at [facility].

## 2019-04-28 ENCOUNTER — Encounter (HOSPITAL_COMMUNITY)
Admission: RE | Admit: 2019-04-28 | Discharge: 2019-04-28 | Disposition: A | Discharge status: Home / Self Care | Payer: PPO | Source: Ambulatory Visit | Attending: Pulmonary Disease | Admitting: Pulmonary Disease

## 2019-04-28 ENCOUNTER — Other Ambulatory Visit: Payer: Self-pay

## 2019-04-28 VITALS — Wt 225.3 lb

## 2019-04-28 DIAGNOSIS — J449 Chronic obstructive pulmonary disease, unspecified: Secondary | ICD-10-CM

## 2019-04-28 NOTE — Progress Notes (Signed)
Daily Session Note  Patient Details  Name: [name] [name] MRN: [id number] Date of Birth: 29-Apr-1945 Referring Provider:     Pulmonary Rehab Walk Test from 03/09/2019 in [facility]  Referring Provider  Dr [name]      Encounter Date: 04/28/2019  Check In: Session Check In - 04/28/19 1055      Check-In   Supervising physician immediately available to respond to emergencies  [facility] immediately available    Physician(s)  Dr. Tawanna Solo    Location  [facility]    Staff Present  Rosebud Poles, RN, Bjorn Loser, MS, Exercise Physiologist;Christalynn Boise Ysidro Evert, RN    Virtual Visit  No    Medication changes reported      No    Fall or balance concerns reported     No    Tobacco Cessation  No Change    Warm-up and Cool-down  Performed as group-led instruction    Resistance Training Performed  Yes    VAD Patient?  No    PAD/SET Patient?  No      Pain Assessment   Currently in Pain?  No/denies    Multiple Pain Sites  No       Capillary Blood Glucose: No results found for this or any previous visit (from the past 24 hour(s)).  Exercise Prescription Changes - 04/28/19 1100      Response to Exercise   Blood Pressure (Admit)  124/70    Blood Pressure (Exercise)  130/70    Blood Pressure (Exit)  140/68    Heart Rate (Admit)  99 bpm    Heart Rate (Exercise)  109 bpm    Heart Rate (Exit)  99 bpm    Oxygen Saturation (Admit)  96 %    Oxygen Saturation (Exercise)  93 %    Oxygen Saturation (Exit)  98 %    Rating of Perceived Exertion (Exercise)  12    Perceived Dyspnea (Exercise)  1    Duration  Continue with 30 min of aerobic exercise without signs/symptoms of physical distress.    Intensity  THRR unchanged      Progression   Progression  Continue to progress workloads to maintain intensity without signs/symptoms of physical distress.      Resistance Training   Training Prescription  Yes    Weight  blue bands    Reps   10-15    Time  10 Minutes      Oxygen   Oxygen  Continuous    Liters  2      NuStep   Level  3    SPM  80    Minutes  15    METs  2      Arm Ergometer   Level  2.5    Minutes  15       Social History   Tobacco Use  Smoking Status Former Smoker  . Packs/day: 2.00  . Years: 40.00  . Pack years: 80.00  . Types: Cigarettes  . Quit date: 2010  . Years since quitting: 11.3  Smokeless Tobacco Never Used    Goals Met:  Exercise tolerated well No report of cardiac concerns or symptoms Strength training completed today  Goals Unmet:  Not Applicable  Comments: Service time is from 1020 to 1115    Dr. [name] is Medical Director for Cardiac Rehab at [facility].

## 2019-04-29 NOTE — Progress Notes (Signed)
Pulmonary Individual Treatment Plan  Patient Details  Name: [name] [name] MRN: [id number] Date of Birth: Aug 16, 1945 Referring Provider:     Pulmonary Rehab Walk Test from 03/09/2019 in [facility]  Referring Provider  Dr [name]      Initial Encounter Date:    PULMONARY REHAB CHRONIC OBSTRUCTIVE PULMONARY DISEASE from 04/14/2019 in [facility]  Date  04/14/19      Visit Diagnosis: COPD with chronic bronchitis and emphysema ([place])  Patient's Home Medications on Admission:   Current Outpatient Medications:  .  albuterol (PROVENTIL HFA;VENTOLIN HFA) 108 (90 Base) MCG/ACT inhaler, Inhale 2 puffs into the lungs every 6 (six) hours as needed for wheezing or shortness of breath., Disp: 1 Inhaler, Rfl: 2 .  ALPRAZolam (XANAX) 0.25 MG tablet, Take 0.25 mg by mouth daily as needed., Disp: , Rfl:  .  amLODipine (NORVASC) 2.5 MG tablet, Take 5 mg by mouth daily. , Disp: , Rfl:  .  amlodipine-atorvastatin (CADUET) 10-10 MG tablet, Take 1 tablet by mouth daily., Disp: , Rfl:  .  atorvastatin (LIPITOR) 10 MG tablet, Take 10 mg by mouth daily., Disp: , Rfl:  .  Fluticasone-Umeclidin-Vilant (TRELEGY ELLIPTA) 100-62.5-25 MCG/INH AEPB, Inhale 1 puff into the lungs daily., Disp: 3 each, Rfl: 3 .  furosemide (LASIX) 20 MG tablet, Take 20 mg by mouth daily., Disp: , Rfl:  .  ibuprofen (ADVIL,MOTRIN) 200 MG tablet, Take 400 mg by mouth every 6 (six) hours as needed., Disp: , Rfl:  .  loratadine (CLARITIN) 10 MG tablet, Take 10 mg by mouth daily as needed for allergies., Disp: , Rfl:  .  vitamin B-12 (CYANOCOBALAMIN) 1000 MCG tablet, Take 1,000 mcg by mouth., Disp: , Rfl:   Past Medical History: Past Medical History:  Diagnosis Date  . COPD (chronic obstructive pulmonary disease) (HCC)     Tobacco Use: Social History   Tobacco Use  Smoking Status Former Smoker  . Packs/day: 2.00  . Years: 40.00  . Pack years: 80.00  . Types:  Cigarettes  . Quit date: 2010  . Years since quitting: 11.3  Smokeless Tobacco Never Used    Labs: Recent Review Flowsheet Data    There is no flowsheet data to display.      Capillary Blood Glucose: No results found for: GLUCAP   Pulmonary Assessment Scores: Pulmonary Assessment Scores    Row Name 03/09/19 1006 03/09/19 1041       ADL UCSD   ADL Phase  Entry  Entry    SOB Score total  61  --      CAT Score   CAT Score  15  --      mMRC Score   mMRC Score  --  2      UCSD: Self-administered rating of dyspnea associated with activities of daily living (ADLs) 6-point scale (0 = "not at all" to 5 = "maximal or unable to do because of breathlessness")  Scoring Scores range from 0 to 120.  Minimally important difference is 5 units  CAT: CAT can identify the health impairment of COPD patients and is better correlated with disease progression.  CAT has a scoring range of zero to 40. The CAT score is classified into four groups of low (less than 10), medium (10 - 20), high (21-30) and very high (31-40) based on the impact level of disease on health status. A CAT score over 10 suggests significant symptoms.  A worsening CAT score could be  explained by an exacerbation, poor medication adherence, poor inhaler technique, or progression of COPD or comorbid conditions.  CAT MCID is 2 points  mMRC: mMRC (Modified Medical Research Council) Dyspnea Scale is used to assess the degree of baseline functional disability in patients of respiratory disease due to dyspnea. No minimal important difference is established. A decrease in score of 1 point or greater is considered a positive change.   Pulmonary Function Assessment: Pulmonary Function Assessment - 03/09/19 0953      Breath   Bilateral Breath Sounds  Clear;Decreased    Shortness of Breath  Yes;Limiting activity;Panic with Shortness of Breath       Exercise Target Goals: Exercise Program Goal: Individual exercise prescription  set using results from initial 6 min walk test and THRR while considering  patient's activity barriers and safety.   Exercise Prescription Goal: Initial exercise prescription builds to 30-45 minutes a day of aerobic activity, 2-3 days per week.  Home exercise guidelines will be given to patient during program as part of exercise prescription that the participant will acknowledge.  Activity Barriers & Risk Stratification: Activity Barriers & Cardiac Risk Stratification - 03/09/19 0950      Activity Barriers & Cardiac Risk Stratification   Activity Barriers  Joint Problems;Deconditioning;Muscular Weakness;Shortness of Breath       6 Minute Walk: 6 Minute Walk    Row Name 03/09/19 1041         6 Minute Walk   Phase  Initial     Distance  1052 feet     Walk Time  4.83 minutes     # of Rest Breaks  1     MPH  1.99     METS  2.84     RPE  13     Perceived Dyspnea   3     VO2 Peak  9.91     Symptoms  Yes (comment)     Comments  1 min 10 sec standing rest due to desaturation. Recovered on 2L.     Resting HR  96 bpm     Resting BP  131/62     Resting Oxygen Saturation   94 %     Exercise Oxygen Saturation  during 6 min walk  84 %     Max Ex. HR  126 bpm     Max Ex. BP  180/82     2 Minute Post BP  165/83       Interval HR   1 Minute HR  117     2 Minute HR  126     3 Minute HR  126     4 Minute HR  110     5 Minute HR  116     6 Minute HR  126     2 Minute Post HR  106     Interval Heart Rate?  Yes       Interval Oxygen   Interval Oxygen?  Yes     Baseline Oxygen Saturation %  94 %     1 Minute Oxygen Saturation %  93 %     1 Minute Liters of Oxygen  0 L     2 Minute Oxygen Saturation %  88 %     2 Minute Liters of Oxygen  0 L     3 Minute Oxygen Saturation %  84 %     3 Minute Liters of Oxygen  0 L     4 Minute  Oxygen Saturation %  92 %     4 Minute Liters of Oxygen  2 L     5 Minute Oxygen Saturation %  95 %     5 Minute Liters of Oxygen  2 L     6 Minute Oxygen  Saturation %  91 %     6 Minute Liters of Oxygen  2 L     2 Minute Post Oxygen Saturation %  97 %     2 Minute Post Liters of Oxygen  0 L        Oxygen Initial Assessment: Oxygen Initial Assessment - 03/09/19 1040      Home Oxygen   Home Oxygen Device  None    Sleep Oxygen Prescription  None    Home Exercise Oxygen Prescription  Continuous    Liters per minute  2    Home at Rest Exercise Oxygen Prescription  None    Compliance with Home Oxygen Use  Yes      Initial 6 min Walk   Oxygen Used  Continuous    Liters per minute  2      Program Oxygen Prescription   Program Oxygen Prescription  Continuous    Liters per minute  2      Intervention   Short Term Goals  To learn and exhibit compliance with exercise, home and travel O2 prescription;To learn and understand importance of monitoring SPO2 with pulse oximeter and demonstrate accurate use of the pulse oximeter.;To learn and understand importance of maintaining oxygen saturations>88%;To learn and demonstrate proper pursed lip breathing techniques or other breathing techniques.;To learn and demonstrate proper use of respiratory medications    Long  Term Goals  Exhibits compliance with exercise, home and travel O2 prescription;Verbalizes importance of monitoring SPO2 with pulse oximeter and return demonstration;Maintenance of O2 saturations>88%;Exhibits proper breathing techniques, such as pursed lip breathing or other method taught during program session;Compliance with respiratory medication;Demonstrates proper use of MDI's       Oxygen Re-Evaluation: Oxygen Re-Evaluation    Row Name 03/31/19 0803 04/28/19 0753           Program Oxygen Prescription   Program Oxygen Prescription  Continuous  Continuous      Liters per minute  2  2        Home Oxygen   Home Oxygen Device  Portable Concentrator Order just sent to [facility]  Portable Concentrator      Sleep Oxygen Prescription  None  None      Home Exercise Oxygen Prescription   Continuous  Continuous      Liters per minute  2  2      Home at Rest Exercise Oxygen Prescription  None  None      Compliance with Home Oxygen Use  Yes  Yes        Goals/Expected Outcomes   Short Term Goals  To learn and exhibit compliance with exercise, home and travel O2 prescription;To learn and understand importance of monitoring SPO2 with pulse oximeter and demonstrate accurate use of the pulse oximeter.;To learn and understand importance of maintaining oxygen saturations>88%;To learn and demonstrate proper pursed lip breathing techniques or other breathing techniques.;To learn and demonstrate proper use of respiratory medications  To learn and exhibit compliance with exercise, home and travel O2 prescription;To learn and understand importance of monitoring SPO2 with pulse oximeter and demonstrate accurate use of the pulse oximeter.;To learn and understand importance of maintaining oxygen saturations>88%;To learn and demonstrate proper pursed lip  breathing techniques or other breathing techniques.;To learn and demonstrate proper use of respiratory medications      Long  Term Goals  Exhibits compliance with exercise, home and travel O2 prescription;Verbalizes importance of monitoring SPO2 with pulse oximeter and return demonstration;Maintenance of O2 saturations>88%;Exhibits proper breathing techniques, such as pursed lip breathing or other method taught during program session;Compliance with respiratory medication;Demonstrates proper use of MDI's  Exhibits compliance with exercise, home and travel O2 prescription;Verbalizes importance of monitoring SPO2 with pulse oximeter and return demonstration;Maintenance of O2 saturations>88%;Exhibits proper breathing techniques, such as pursed lip breathing or other method taught during program session;Compliance with respiratory medication;Demonstrates proper use of MDI's      Goals/Expected Outcomes  compliance  compliance         Oxygen Discharge  (Final Oxygen Re-Evaluation): Oxygen Re-Evaluation - 04/28/19 0753      Program Oxygen Prescription   Program Oxygen Prescription  Continuous    Liters per minute  2      Home Oxygen   Home Oxygen Device  Portable Concentrator    Sleep Oxygen Prescription  None    Home Exercise Oxygen Prescription  Continuous    Liters per minute  2    Home at Rest Exercise Oxygen Prescription  None    Compliance with Home Oxygen Use  Yes      Goals/Expected Outcomes   Short Term Goals  To learn and exhibit compliance with exercise, home and travel O2 prescription;To learn and understand importance of monitoring SPO2 with pulse oximeter and demonstrate accurate use of the pulse oximeter.;To learn and understand importance of maintaining oxygen saturations>88%;To learn and demonstrate proper pursed lip breathing techniques or other breathing techniques.;To learn and demonstrate proper use of respiratory medications    Long  Term Goals  Exhibits compliance with exercise, home and travel O2 prescription;Verbalizes importance of monitoring SPO2 with pulse oximeter and return demonstration;Maintenance of O2 saturations>88%;Exhibits proper breathing techniques, such as pursed lip breathing or other method taught during program session;Compliance with respiratory medication;Demonstrates proper use of MDI's    Goals/Expected Outcomes  compliance       Initial Exercise Prescription: Initial Exercise Prescription - 04/14/19 1100      Date of Initial Exercise RX and Referring Provider   Date  04/14/19       Perform Capillary Blood Glucose checks as needed.  Exercise Prescription Changes: Exercise Prescription Changes    Row Name 03/17/19 1100 03/31/19 1100 04/09/19 1200 04/14/19 1200 04/28/19 1100     Response to Exercise   Blood Pressure (Admit)  130/74  140/70  --  160/80  124/70   Blood Pressure (Exercise)  166/72  160/64  --  170/88  130/70   Blood Pressure (Exit)  142/78  142/60  --  144/72  140/68    Heart Rate (Admit)  108 bpm  96 bpm  --  97 bpm  99 bpm   Heart Rate (Exercise)  116 bpm  115 bpm  --  115 bpm  109 bpm   Heart Rate (Exit)  106 bpm  106 bpm  --  98 bpm  99 bpm   Oxygen Saturation (Admit)  92 %  97 %  --  97 %  96 %   Oxygen Saturation (Exercise)  92 %  92 %  --  90 %  93 %   Oxygen Saturation (Exit)  97 %  96 %  --  94 %  98 %   Rating of Perceived Exertion (Exercise)  13  13  --  13  12   Perceived Dyspnea (Exercise)  2  2.5  --  3  1   Duration  Continue with 30 min of aerobic exercise without signs/symptoms of physical distress.  Continue with 30 min of aerobic exercise without signs/symptoms of physical distress.  --  Continue with 30 min of aerobic exercise without signs/symptoms of physical distress.  Continue with 30 min of aerobic exercise without signs/symptoms of physical distress.   Intensity  THRR unchanged  THRR unchanged  --  THRR unchanged  THRR unchanged     Progression   Progression  Continue to progress workloads to maintain intensity without signs/symptoms of physical distress.  Continue to progress workloads to maintain intensity without signs/symptoms of physical distress.  --  Continue to progress workloads to maintain intensity without signs/symptoms of physical distress.  Continue to progress workloads to maintain intensity without signs/symptoms of physical distress.     Resistance Training   Training Prescription  Yes  Yes  --  Yes  Yes   Weight  blue bands  blue bands  --  blue bands  blue bands   Reps  10-15  10-15  --  10-15  10-15   Time  --  10 Minutes  --  10 Minutes  10 Minutes     Oxygen   Oxygen  Continuous  Continuous  --  Continuous  Continuous   Liters  2  2  --  2  2     NuStep   Level  2  3  --  3  3   SPM  80  80  --  80  80   Minutes  15  15  --  15  15   METs  1.9  2  --  2  2     Arm Ergometer   Level  2  2  --  2.5  2.5   Watts  34  --  --  --  --   Minutes  15  15  --  15  15     Home Exercise Plan   Plans to  continue exercise at  --  --  Home (comment)  --  --   Frequency  --  --  Add 3 additional days to program exercise sessions.  --  --   Initial Home Exercises Provided  --  --  04/09/19  --  --      Exercise Comments: Exercise Comments    Row Name 04/09/19 1202           Exercise Comments  home exercise complete          Exercise Goals and Review: Exercise Goals    Row Name 03/09/19 1102 03/31/19 0804 04/28/19 0754         Exercise Goals   Increase Physical Activity  Yes  Yes  Yes     Intervention  Provide advice, education, support and counseling about physical activity/exercise needs.;Develop an individualized exercise prescription for aerobic and resistive training based on initial evaluation findings, risk stratification, comorbidities and participant's personal goals.  Provide advice, education, support and counseling about physical activity/exercise needs.;Develop an individualized exercise prescription for aerobic and resistive training based on initial evaluation findings, risk stratification, comorbidities and participant's personal goals.  Provide advice, education, support and counseling about physical activity/exercise needs.;Develop an individualized exercise prescription for aerobic and resistive training based on initial evaluation findings, risk stratification, comorbidities and participant's personal  goals.     Expected Outcomes  Short Term: Attend rehab on a regular basis to increase amount of physical activity.;Long Term: Add in home exercise to make exercise part of routine and to increase amount of physical activity.;Long Term: Exercising regularly at least 3-5 days a week.  Short Term: Attend rehab on a regular basis to increase amount of physical activity.;Long Term: Add in home exercise to make exercise part of routine and to increase amount of physical activity.;Long Term: Exercising regularly at least 3-5 days a week.  Short Term: Attend rehab on a regular basis to  increase amount of physical activity.;Long Term: Add in home exercise to make exercise part of routine and to increase amount of physical activity.;Long Term: Exercising regularly at least 3-5 days a week.     Increase Strength and Stamina  Yes  Yes  Yes     Intervention  Provide advice, education, support and counseling about physical activity/exercise needs.;Develop an individualized exercise prescription for aerobic and resistive training based on initial evaluation findings, risk stratification, comorbidities and participant's personal goals.  Provide advice, education, support and counseling about physical activity/exercise needs.;Develop an individualized exercise prescription for aerobic and resistive training based on initial evaluation findings, risk stratification, comorbidities and participant's personal goals.  Provide advice, education, support and counseling about physical activity/exercise needs.;Develop an individualized exercise prescription for aerobic and resistive training based on initial evaluation findings, risk stratification, comorbidities and participant's personal goals.     Expected Outcomes  Short Term: Increase workloads from initial exercise prescription for resistance, speed, and METs.;Short Term: Perform resistance training exercises routinely during rehab and add in resistance training at home;Long Term: Improve cardiorespiratory fitness, muscular endurance and strength as measured by increased METs and functional capacity (6MWT)  Short Term: Increase workloads from initial exercise prescription for resistance, speed, and METs.;Short Term: Perform resistance training exercises routinely during rehab and add in resistance training at home;Long Term: Improve cardiorespiratory fitness, muscular endurance and strength as measured by increased METs and functional capacity (6MWT)  Short Term: Increase workloads from initial exercise prescription for resistance, speed, and METs.;Short  Term: Perform resistance training exercises routinely during rehab and add in resistance training at home;Long Term: Improve cardiorespiratory fitness, muscular endurance and strength as measured by increased METs and functional capacity (6MWT)     Able to understand and use rate of perceived exertion (RPE) scale  Yes  Yes  Yes     Intervention  Provide education and explanation on how to use RPE scale  Provide education and explanation on how to use RPE scale  Provide education and explanation on how to use RPE scale     Expected Outcomes  Short Term: Able to use RPE daily in rehab to express subjective intensity level;Long Term:  Able to use RPE to guide intensity level when exercising independently  Short Term: Able to use RPE daily in rehab to express subjective intensity level;Long Term:  Able to use RPE to guide intensity level when exercising independently  Short Term: Able to use RPE daily in rehab to express subjective intensity level;Long Term:  Able to use RPE to guide intensity level when exercising independently     Able to understand and use Dyspnea scale  Yes  Yes  Yes     Intervention  Provide education and explanation on how to use Dyspnea scale  Provide education and explanation on how to use Dyspnea scale  Provide education and explanation on how to use Dyspnea scale  Expected Outcomes  Short Term: Able to use Dyspnea scale daily in rehab to express subjective sense of shortness of breath during exertion;Long Term: Able to use Dyspnea scale to guide intensity level when exercising independently  Short Term: Able to use Dyspnea scale daily in rehab to express subjective sense of shortness of breath during exertion;Long Term: Able to use Dyspnea scale to guide intensity level when exercising independently  Short Term: Able to use Dyspnea scale daily in rehab to express subjective sense of shortness of breath during exertion;Long Term: Able to use Dyspnea scale to guide intensity level when  exercising independently     Knowledge and understanding of Target Heart Rate Range (THRR)  Yes  Yes  Yes     Intervention  Provide education and explanation of THRR including how the numbers were predicted and where they are located for reference  Provide education and explanation of THRR including how the numbers were predicted and where they are located for reference  Provide education and explanation of THRR including how the numbers were predicted and where they are located for reference     Expected Outcomes  Short Term: Able to state/look up THRR;Short Term: Able to use daily as guideline for intensity in rehab;Long Term: Able to use THRR to govern intensity when exercising independently  Short Term: Able to state/look up THRR;Short Term: Able to use daily as guideline for intensity in rehab;Long Term: Able to use THRR to govern intensity when exercising independently  Short Term: Able to state/look up THRR;Short Term: Able to use daily as guideline for intensity in rehab;Long Term: Able to use THRR to govern intensity when exercising independently     Understanding of Exercise Prescription  Yes  Yes  Yes     Intervention  Provide education, explanation, and written materials on patient's individual exercise prescription  Provide education, explanation, and written materials on patient's individual exercise prescription  Provide education, explanation, and written materials on patient's individual exercise prescription     Expected Outcomes  Short Term: Able to explain program exercise prescription;Long Term: Able to explain home exercise prescription to exercise independently  Short Term: Able to explain program exercise prescription;Long Term: Able to explain home exercise prescription to exercise independently  Short Term: Able to explain program exercise prescription;Long Term: Able to explain home exercise prescription to exercise independently        Exercise Goals Re-Evaluation : Exercise  Goals Re-Evaluation    Row Name 03/31/19 0804 04/28/19 0754           Exercise Goal Re-Evaluation   Exercise Goals Review  Increase Physical Activity;Increase Strength and Stamina;Able to understand and use rate of perceived exertion (RPE) scale;Able to understand and use Dyspnea scale;Knowledge and understanding of Target Heart Rate Range (THRR);Understanding of Exercise Prescription  Increase Physical Activity;Increase Strength and Stamina;Able to understand and use rate of perceived exertion (RPE) scale;Able to understand and use Dyspnea scale;Knowledge and understanding of Target Heart Rate Range (THRR);Understanding of Exercise Prescription      Comments  Pt has completed 4 exercise sessions. He was recently approved for a POC at home, so he will be able to safely exercise at home. Pt is motivated and is progressing slowly. He currently exercises at 2.0 METs on the stepper. Will continue to monitor and progress as able.  Pt has completed 11 exercise sessions. He now has his POC and can exercise at home. He is currently exercising at 2.1 METs on the stepper. Will continue to monitor  and progress as able.      Expected Outcomes  Through exercise at rehab and at home, the patient will decrease shortness of breath with daily activities and feel confident in carrying out an exercise regime at home.  Through exercise at rehab and at home, the patient will decrease shortness of breath with daily activities and feel confident in carrying out an exercise regime at home.         Discharge Exercise Prescription (Final Exercise Prescription Changes): Exercise Prescription Changes - 04/28/19 1100      Response to Exercise   Blood Pressure (Admit)  124/70    Blood Pressure (Exercise)  130/70    Blood Pressure (Exit)  140/68    Heart Rate (Admit)  99 bpm    Heart Rate (Exercise)  109 bpm    Heart Rate (Exit)  99 bpm    Oxygen Saturation (Admit)  96 %    Oxygen Saturation (Exercise)  93 %    Oxygen  Saturation (Exit)  98 %    Rating of Perceived Exertion (Exercise)  12    Perceived Dyspnea (Exercise)  1    Duration  Continue with 30 min of aerobic exercise without signs/symptoms of physical distress.    Intensity  THRR unchanged      Progression   Progression  Continue to progress workloads to maintain intensity without signs/symptoms of physical distress.      Resistance Training   Training Prescription  Yes    Weight  blue bands    Reps  10-15    Time  10 Minutes      Oxygen   Oxygen  Continuous    Liters  2      NuStep   Level  3    SPM  80    Minutes  15    METs  2      Arm Ergometer   Level  2.5    Minutes  15       Nutrition:  Target Goals: Understanding of nutrition guidelines, daily intake of sodium 1500mg , cholesterol 200mg , calories 30% from fat and 7% or less from saturated fats, daily to have 5 or more servings of fruits and vegetables.  Biometrics: Pre Biometrics - 03/09/19 0952      Pre Biometrics   Height  5\' 10"  (1.778 m)    Weight  229 lb 8 oz (104.1 kg)    BMI (Calculated)  32.93    Grip Strength  27 kg        Nutrition Therapy Plan and Nutrition Goals: Nutrition Therapy & Goals - 03/19/19 1201      Nutrition Therapy   Diet  low Sodium      Personal Nutrition Goals   Nutrition Goal  Pt to identify and limit food sources of saturated fat, trans fat, refined carbohydrates and sodium    Personal Goal #2  Pt to begin reading labels      Intervention Plan   Intervention  Prescribe, educate and counsel regarding individualized specific dietary modifications aiming towards targeted core components such as weight, hypertension, lipid management, diabetes, heart failure and other comorbidities.    Expected Outcomes  Short Term Goal: A plan has been developed with personal nutrition goals set during dietitian appointment.       Nutrition Assessments: Nutrition Assessments - 03/19/19 1202      Rate Your Plate Scores   Pre Score  44        Nutrition Goals Re-Evaluation: Nutrition  Goals Re-Evaluation    Row Name 03/19/19 1202 04/28/19 0947           Goals   Current Weight  228 lb (103.4 kg)  226 lb (102.5 kg)      Nutrition Goal  Pt to identify and limit food sources of saturated fat, trans fat, refined carbohydrates and sodium  Pt to identify and limit food sources of saturated fat, trans fat, refined carbohydrates and sodium        Personal Goal #2 Re-Evaluation   Personal Goal #2  Pt to begin reading labels  Pt to begin reading labels         Nutrition Goals Discharge (Final Nutrition Goals Re-Evaluation): Nutrition Goals Re-Evaluation - 04/28/19 0947      Goals   Current Weight  226 lb (102.5 kg)    Nutrition Goal  Pt to identify and limit food sources of saturated fat, trans fat, refined carbohydrates and sodium      Personal Goal #2 Re-Evaluation   Personal Goal #2  Pt to begin reading labels       Psychosocial: Target Goals: Acknowledge presence or absence of significant depression and/or stress, maximize coping skills, provide positive support system. Participant is able to verbalize types and ability to use techniques and skills needed for reducing stress and depression.  Initial Review & Psychosocial Screening: Initial Psych Review & Screening - 03/09/19 1013      Initial Review   Current issues with  Current Anxiety/Panic;Current Stress Concerns  (Pended)     Source of Stress Concerns  Family  (Pended)    Current stress with his son, is being treated for anxiety     Family Dynamics   Good Support System?  Yes  (Pended)        Quality of Life Scores:  Scores of 19 and below usually indicate a poorer quality of life in these areas.  A difference of  2-3 points is a clinically meaningful difference.  A difference of 2-3 points in the total score of the Quality of Life Index has been associated with significant improvement in overall quality of life, self-image, physical symptoms, and general  health in studies assessing change in quality of life.  PHQ-9: Recent Review Flowsheet Data    Depression screen [facility] 2/9 03/09/2019   Decreased Interest 0   Down, Depressed, Hopeless 0   PHQ - 2 Score 0   Altered sleeping 0   Tired, decreased energy 0   Change in appetite 0   Feeling bad or failure about yourself  0   Moving slowly or fidgety/restless 0   Suicidal thoughts 0   PHQ-9 Score 0   Difficult doing work/chores Not difficult at all     Interpretation of Total Score  Total Score Depression Severity:  1-4 = Minimal depression, 5-9 = Mild depression, 10-14 = Moderate depression, 15-19 = Moderately severe depression, 20-27 = Severe depression   Psychosocial Evaluation and Intervention: Psychosocial Evaluation - 03/09/19 1056      Psychosocial Evaluation & Interventions   Interventions  Encouraged to exercise with the program and follow exercise prescription;Stress management education    Comments  Patient has stress and anxiety issues with his son    Expected Outcomes  For patient to manage stress in a healthy manner    Continue Psychosocial Services   Follow up required by staff       Psychosocial Re-Evaluation: Psychosocial Re-Evaluation    Rennert Name 03/30/19 1327 04/27/19 1324  Psychosocial Re-Evaluation   Current issues with  Current Anxiety/Panic;Current Stress Concerns  Current Anxiety/Panic      Comments  Mel just started the program, he has current stress and anxiety re: his relationship with his son.  He will be provided with stress management and relaxation materials while in the program.  Occassionally arrives to class with some anxiety and does not always have a reason for it, he practices deep breathing and mental imaging to ease the anxiety, which seems to work.      Expected Outcomes  For Mel to manage his stress/anxiety in healthy ways.  For Mel to be able to handle his anxiety in a healthy manner.      Interventions  Encouraged to attend  Pulmonary Rehabilitation for the exercise;Relaxation education;Stress management education  Encouraged to attend Pulmonary Rehabilitation for the exercise;Relaxation education;Stress management education      Continue Psychosocial Services   Follow up required by staff  No Follow up required        Initial Review   Source of Stress Concerns  Family  Family         Psychosocial Discharge (Final Psychosocial Re-Evaluation): Psychosocial Re-Evaluation - 04/27/19 1324      Psychosocial Re-Evaluation   Current issues with  Current Anxiety/Panic    Comments  Occassionally arrives to class with some anxiety and does not always have a reason for it, he practices deep breathing and mental imaging to ease the anxiety, which seems to work.    Expected Outcomes  For Mel to be able to handle his anxiety in a healthy manner.    Interventions  Encouraged to attend Pulmonary Rehabilitation for the exercise;Relaxation education;Stress management education    Continue Psychosocial Services   No Follow up required      Initial Review   Source of Stress Concerns  Family       Education: Education Goals: Education classes will be provided on a weekly basis, covering required topics. Participant will state understanding/return demonstration of topics presented.  Learning Barriers/Preferences: Learning Barriers/Preferences - 03/09/19 1101      Learning Barriers/Preferences   Learning Barriers  None    Learning Preferences  Audio;Computer/Internet;Group Instruction;Individual Instruction;Pictoral;Skilled Demonstration;Verbal Instruction;Video;Written Material       Education Topics: Risk Factor Reduction:  -Group instruction that is supported by a PowerPoint presentation. Instructor discusses the definition of a risk factor, different risk factors for pulmonary disease, and how the heart and lungs work together.     PULMONARY REHAB CHRONIC OBSTRUCTIVE PULMONARY DISEASE from 04/23/2019 in [place]  Date  04/16/19  Educator  handout  Instruction Review Code  -- [na]      Nutrition for Pulmonary Patient:  -Group instruction provided by PowerPoint slides, verbal discussion, and written materials to support subject matter. The instructor gives an explanation and review of healthy diet recommendations, which includes a discussion on weight management, recommendations for fruit and vegetable consumption, as well as protein, fluid, caffeine, fiber, sodium, sugar, and alcohol. Tips for eating when patients are short of breath are discussed.   PULMONARY REHAB CHRONIC OBSTRUCTIVE PULMONARY DISEASE from 04/23/2019 in [place]  Date  04/14/19  Educator  MD  Instruction Review Code  2- Demonstrated Understanding      Pursed Lip Breathing:  -Group instruction that is supported by demonstration and informational handouts. Instructor discusses the benefits of pursed lip and diaphragmatic breathing and detailed demonstration on how to preform both.  Oxygen Safety:  -Group instruction provided by PowerPoint, verbal discussion, and written material to support subject matter. There is an overview of "What is Oxygen" and "Why do we need it".  Instructor also reviews how to create a safe environment for oxygen use, the importance of using oxygen as prescribed, and the risks of noncompliance. There is a brief discussion on traveling with oxygen and resources the patient may utilize.   Oxygen Equipment:  -Group instruction provided by [facility] Staff utilizing handouts, written materials, and equipment demonstrations.   Signs and Symptoms:  -Group instruction provided by written material and verbal discussion to support subject matter. Warning signs and symptoms of infection, stroke, and heart attack are reviewed and when to call the physician/911 reinforced. Tips for preventing the spread of infection discussed.   Advanced  Directives:  -Group instruction provided by verbal instruction and written material to support subject matter. Instructor reviews Advanced Directive laws and proper instruction for filling out document.   Pulmonary Video:  -Group video education that reviews the importance of medication and oxygen compliance, exercise, good nutrition, pulmonary hygiene, and pursed lip and diaphragmatic breathing for the pulmonary patient.   Exercise for the Pulmonary Patient:  -Group instruction that is supported by a PowerPoint presentation. Instructor discusses benefits of exercise, core components of exercise, frequency, duration, and intensity of an exercise routine, importance of utilizing pulse oximetry during exercise, safety while exercising, and options of places to exercise outside of rehab.     Pulmonary Medications:  -Verbally interactive group education provided by instructor with focus on inhaled medications and proper administration.   Anatomy and Physiology of the Respiratory System and Intimacy:  -Group instruction provided by PowerPoint, verbal discussion, and written material to support subject matter. Instructor reviews respiratory cycle and anatomical components of the respiratory system and their functions. Instructor also reviews differences in obstructive and restrictive respiratory diseases with examples of each. Intimacy, Sex, and Sexuality differences are reviewed with a discussion on how relationships can change when diagnosed with pulmonary disease. Common sexual concerns are reviewed.   PULMONARY REHAB CHRONIC OBSTRUCTIVE PULMONARY DISEASE from 04/23/2019 in [place]  Date  03/19/19  Educator  handout  Instruction Review Code  -- [na]      MD DAY -A group question and answer session with a medical doctor that allows participants to ask questions that relate to their pulmonary disease state.   OTHER EDUCATION -Group or individual verbal,  written, or video instructions that support the educational goals of the pulmonary rehab program.   PULMONARY REHAB CHRONIC OBSTRUCTIVE PULMONARY DISEASE from 04/23/2019 in [place]  Date  04/23/19  Educator  [facility] handout]      Holiday Eating Survival Tips:  -Group instruction provided by PowerPoint slides, verbal discussion, and written materials to support subject matter. The instructor gives patients tips, tricks, and techniques to help them not only survive but enjoy the holidays despite the onslaught of food that accompanies the holidays.   Knowledge Questionnaire Score: Knowledge Questionnaire Score - 03/09/19 1012      Knowledge Questionnaire Score   Pre Score  15/18       Core Components/Risk Factors/Patient Goals at Admission: Personal Goals and Risk Factors at Admission - 03/09/19 1102      Core Components/Risk Factors/Patient Goals on Admission   Improve shortness of breath with ADL's  Yes    Intervention  Provide education, individualized exercise plan and daily activity instruction to  help decrease symptoms of SOB with activities of daily living.    Expected Outcomes  Short Term: Improve cardiorespiratory fitness to achieve a reduction of symptoms when performing ADLs;Long Term: Be able to perform more ADLs without symptoms or delay the onset of symptoms    Stress  Yes    Intervention  --   in hopes that exercise will help relieve stress      Core Components/Risk Factors/Patient Goals Review:  Goals and Risk Factor Review    Row Name 03/09/19 1103 03/30/19 1330 04/27/19 1326         Core Components/Risk Factors/Patient Goals Review   Personal Goals Review  Develop more efficient breathing techniques such as purse lipped breathing and diaphragmatic breathing and practicing self-pacing with activity.;Increase knowledge of respiratory medications and ability to use respiratory devices properly.;Improve shortness of breath with  ADL's;Stress;Lipids  Develop more efficient breathing techniques such as purse lipped breathing and diaphragmatic breathing and practicing self-pacing with activity.;Increase knowledge of respiratory medications and ability to use respiratory devices properly.;Improve shortness of breath with ADL's  Develop more efficient breathing techniques such as purse lipped breathing and diaphragmatic breathing and practicing self-pacing with activity.;Increase knowledge of respiratory medications and ability to use respiratory devices properly.;Improve shortness of breath with ADL's     Review  --  Mel has attended 4 exercise sessions and has required supplemental oxygen @ 2L/min.  He has been reluctant to have his doctor contact a DME to provide this.  He has investigated portable oxygen concentrators on his own to decide which machine he would like to order.  I feel he is closer to accepting that he requires supplemental oxygen.  He has been slow to progress and very deconditioned, but gives it his best, level 2-3 of nustep depending on the day and level 2.5 of the arm ergomenter.     Expected Outcomes  --  See admission goals.  See admission goals.        Core Components/Risk Factors/Patient Goals at Discharge (Final Review):  Goals and Risk Factor Review - 04/27/19 1326      Core Components/Risk Factors/Patient Goals Review   Personal Goals Review  Develop more efficient breathing techniques such as purse lipped breathing and diaphragmatic breathing and practicing self-pacing with activity.;Increase knowledge of respiratory medications and ability to use respiratory devices properly.;Improve shortness of breath with ADL's    Review  He has been slow to progress and very deconditioned, but gives it his best, level 2-3 of nustep depending on the day and level 2.5 of the arm ergomenter.    Expected Outcomes  See admission goals.       ITP Comments:   Comments: ITP REVIEW Pt is making expected progress  toward pulmonary rehab goals after completing 12 sessions. Recommend continued exercise, life style modification, education, and utilization of breathing techniques to increase stamina and strength and decrease shortness of breath with exertion.

## 2019-04-30 ENCOUNTER — Encounter (HOSPITAL_COMMUNITY)
Admission: RE | Admit: 2019-04-30 | Discharge: 2019-04-30 | Disposition: A | Discharge status: Home / Self Care | Payer: PPO | Source: Ambulatory Visit | Attending: Pulmonary Disease | Admitting: Pulmonary Disease

## 2019-04-30 ENCOUNTER — Other Ambulatory Visit: Payer: Self-pay

## 2019-04-30 DIAGNOSIS — J449 Chronic obstructive pulmonary disease, unspecified: Secondary | ICD-10-CM

## 2019-04-30 NOTE — Progress Notes (Signed)
Daily Session Note  Patient Details  Name: [name] [name] MRN: [id number] Date of Birth: 1945-08-27 Referring Provider:     Pulmonary Rehab Walk Test from 03/09/2019 in [place]  Referring Provider  Dr [name]      Encounter Date: 04/30/2019  Check In: Session Check In - 04/30/19 1055      Check-In   Supervising physician immediately available to respond to emergencies  [facility] immediately available    Physician(s)  Dr. Dessa Phi    Location  [facility]    Staff Present  Rosebud Poles, RN, Bjorn Loser, MS, Exercise Physiologist;Delita Chiquito Ysidro Evert, RN    Virtual Visit  No    Medication changes reported      No    Fall or balance concerns reported     No    Tobacco Cessation  No Change    Warm-up and Cool-down  Performed as group-led instruction    Resistance Training Performed  Yes    VAD Patient?  No    PAD/SET Patient?  No      Pain Assessment   Currently in Pain?  No/denies    Multiple Pain Sites  No       Capillary Blood Glucose: No results found for this or any previous visit (from the past 24 hour(s)).    Social History   Tobacco Use  Smoking Status Former Smoker  . Packs/day: 2.00  . Years: 40.00  . Pack years: 80.00  . Types: Cigarettes  . Quit date: 2010  . Years since quitting: 11.3  Smokeless Tobacco Never Used    Goals Met:  Exercise tolerated well No report of cardiac concerns or symptoms Strength training completed today  Goals Unmet:  Not Applicable  Comments: Service time is from 1030 to 1130    Dr. [name] is Medical Director for Cardiac Rehab at [facility].

## 2019-05-02 DIAGNOSIS — J9611 Chronic respiratory failure with hypoxia: Secondary | ICD-10-CM | POA: Diagnosis not present

## 2019-05-02 DIAGNOSIS — J449 Chronic obstructive pulmonary disease, unspecified: Secondary | ICD-10-CM | POA: Diagnosis not present

## 2019-05-02 DIAGNOSIS — J439 Emphysema, unspecified: Secondary | ICD-10-CM | POA: Diagnosis not present

## 2019-05-02 DIAGNOSIS — J42 Unspecified chronic bronchitis: Secondary | ICD-10-CM | POA: Diagnosis not present

## 2019-05-05 ENCOUNTER — Telehealth (HOSPITAL_COMMUNITY): Payer: Self-pay | Admitting: Family Medicine

## 2019-05-05 ENCOUNTER — Encounter (HOSPITAL_COMMUNITY): Payer: PPO

## 2019-05-07 ENCOUNTER — Other Ambulatory Visit: Payer: Self-pay

## 2019-05-07 ENCOUNTER — Encounter (HOSPITAL_COMMUNITY)
Admission: RE | Admit: 2019-05-07 | Discharge: 2019-05-07 | Disposition: A | Discharge status: Home / Self Care | Payer: PPO | Source: Ambulatory Visit | Attending: Pulmonary Disease | Admitting: Pulmonary Disease

## 2019-05-07 DIAGNOSIS — J449 Chronic obstructive pulmonary disease, unspecified: Secondary | ICD-10-CM

## 2019-05-07 NOTE — Progress Notes (Signed)
Daily Session Note  Patient Details  Name: DEANDREA RION MRN: [id number] Date of Birth: [birth year] Referring Provider:     Pulmonary Rehab Walk Test from 03/09/2019 in [place]  Referring Provider  Dr [name]      Encounter Date: 05/07/2019  Check In: Session Check In - 05/07/19 1158      Check-In   Supervising physician immediately available to respond to emergencies  [facility] immediately available    Physician(s)  Dr. Sherral Hammers    Location  [facility]    Staff Present  Rosebud Poles, RN, Bjorn Loser, MS, Exercise Physiologist;Antionio Negron Ysidro Evert, RN    Virtual Visit  No    Medication changes reported      No    Fall or balance concerns reported     No    Tobacco Cessation  No Change    Warm-up and Cool-down  Performed on first and last piece of equipment    Resistance Training Performed  Yes    VAD Patient?  No    PAD/SET Patient?  No      Pain Assessment   Currently in Pain?  No/denies    Multiple Pain Sites  No       Capillary Blood Glucose: No results found for this or any previous visit (from the past 24 hour(s)).    Social History   Tobacco Use  Smoking Status Former Smoker  . Packs/day: 2.00  . Years: 40.00  . Pack years: 80.00  . Types: Cigarettes  . Quit date: 2010  . Years since quitting: 11.3  Smokeless Tobacco Never Used    Goals Met:  Exercise tolerated well No report of cardiac concerns or symptoms Strength training completed today  Goals Unmet:  Not Applicable  Comments: Service time is from 1030 to 1140    Dr. [name] is Medical Director for Cardiac Rehab at [facility].

## 2019-05-12 ENCOUNTER — Other Ambulatory Visit: Payer: Self-pay

## 2019-05-12 ENCOUNTER — Encounter (HOSPITAL_COMMUNITY)
Admission: RE | Admit: 2019-05-12 | Discharge: 2019-05-12 | Disposition: A | Discharge status: Home / Self Care | Payer: PPO | Source: Ambulatory Visit | Attending: Pulmonary Disease | Admitting: Pulmonary Disease

## 2019-05-12 VITALS — Wt 226.4 lb

## 2019-05-12 DIAGNOSIS — J449 Chronic obstructive pulmonary disease, unspecified: Secondary | ICD-10-CM | POA: Insufficient documentation

## 2019-05-12 NOTE — Progress Notes (Signed)
Daily Session Note  Patient Details  Name: [name] [name] MRN: [id number] Date of Birth: 09/25/1945 Referring Provider:     Pulmonary Rehab Walk Test from 03/09/2019 in [place]  Referring Provider  Dr [name]      Encounter Date: 05/12/2019  Check In: Session Check In - 05/12/19 1105      Check-In   Supervising physician immediately available to respond to emergencies  [facility] immediately available    Physician(s)  Dr. Philis Pique    Location  [facility]    Staff Present  Rosebud Poles, RN, Bjorn Loser, MS, Exercise Physiologist;Adit Riddles Ysidro Evert, RN    Virtual Visit  No    Medication changes reported      No    Fall or balance concerns reported     No    Tobacco Cessation  No Change    Warm-up and Cool-down  Performed as group-led instruction    Resistance Training Performed  Yes    VAD Patient?  No    PAD/SET Patient?  No      Pain Assessment   Currently in Pain?  No/denies    Multiple Pain Sites  No       Capillary Blood Glucose: No results found for this or any previous visit (from the past 24 hour(s)).  Exercise Prescription Changes - 05/12/19 1200      Response to Exercise   Blood Pressure (Admit)  136/84    Blood Pressure (Exercise)  146/72    Blood Pressure (Exit)  136/70    Heart Rate (Admit)  109 bpm    Heart Rate (Exercise)  116 bpm    Heart Rate (Exit)  92 bpm    Oxygen Saturation (Admit)  96 %    Oxygen Saturation (Exercise)  93 %    Oxygen Saturation (Exit)  96 %    Rating of Perceived Exertion (Exercise)  13    Perceived Dyspnea (Exercise)  2    Duration  Continue with 30 min of aerobic exercise without signs/symptoms of physical distress.    Intensity  THRR unchanged      Progression   Progression  Continue to progress workloads to maintain intensity without signs/symptoms of physical distress.      Resistance Training   Training Prescription  Yes    Weight  blue bands    Reps   10-15    Time  10 Minutes      Oxygen   Oxygen  Continuous    Liters  2      NuStep   Level  3    SPM  80    Minutes  15    METs  2      Arm Ergometer   Level  2.5    Minutes  15       Social History   Tobacco Use  Smoking Status Former Smoker  . Packs/day: 2.00  . Years: 40.00  . Pack years: 80.00  . Types: Cigarettes  . Quit date: 2010  . Years since quitting: 11.3  Smokeless Tobacco Never Used    Goals Met:  Exercise tolerated well No report of cardiac concerns or symptoms Strength training completed today  Goals Unmet:  Not Applicable  Comments: Service time is from 1035 to 1132    Dr. [name] is Medical Director for Cardiac Rehab at [facility].

## 2019-05-13 ENCOUNTER — Telehealth (HOSPITAL_COMMUNITY): Payer: Self-pay | Admitting: Family Medicine

## 2019-05-14 ENCOUNTER — Encounter (HOSPITAL_COMMUNITY): Payer: PPO

## 2019-05-18 ENCOUNTER — Telehealth (HOSPITAL_COMMUNITY): Payer: Self-pay | Admitting: Family Medicine

## 2019-05-26 ENCOUNTER — Encounter (HOSPITAL_COMMUNITY)
Admission: RE | Admit: 2019-05-26 | Discharge: 2019-05-26 | Disposition: A | Discharge status: Home / Self Care | Payer: PPO | Source: Ambulatory Visit | Attending: Pulmonary Disease | Admitting: Pulmonary Disease

## 2019-05-26 ENCOUNTER — Other Ambulatory Visit: Payer: Self-pay

## 2019-05-26 DIAGNOSIS — J449 Chronic obstructive pulmonary disease, unspecified: Secondary | ICD-10-CM

## 2019-05-29 ENCOUNTER — Telehealth: Payer: Self-pay | Admitting: Primary Care

## 2019-05-29 NOTE — Telephone Encounter (Signed)
Located pt's handicap placard in Beth's sign folder. Beth had signed the form already. Pt is aware. Placard has been placed up front for pick up.

## 2019-06-01 DIAGNOSIS — J42 Unspecified chronic bronchitis: Secondary | ICD-10-CM | POA: Diagnosis not present

## 2019-06-01 DIAGNOSIS — J9611 Chronic respiratory failure with hypoxia: Secondary | ICD-10-CM | POA: Diagnosis not present

## 2019-06-01 DIAGNOSIS — J439 Emphysema, unspecified: Secondary | ICD-10-CM | POA: Diagnosis not present

## 2019-06-01 DIAGNOSIS — J449 Chronic obstructive pulmonary disease, unspecified: Secondary | ICD-10-CM | POA: Diagnosis not present

## 2019-06-01 NOTE — Progress Notes (Signed)
Discharge Progress Report  Patient Details  Name: EFTON THOMLEY MRN: [id number] Date of Birth: [birth year] Referring Provider:     Pulmonary Rehab Walk Test from 03/09/2019 in [place]  Referring Provider  Dr [name]       Number of Visits: 15  Reason for Discharge:  Patient reached a stable level of exercise. Patient independent in their exercise. Patient has met program and personal goals.  Smoking History:  Social History   Tobacco Use  Smoking Status Former Smoker  . Packs/day: 2.00  . Years: 40.00  . Pack years: 80.00  . Types: Cigarettes  . Quit date: 2010  . Years since quitting: 11.4  Smokeless Tobacco Never Used    Diagnosis:  COPD with chronic bronchitis and emphysema ([place])  ADL [facility]: Pulmonary Assessment Scores    Row Name 03/09/19 1006 03/09/19 1041 05/26/19 1152     ADL [facility]   ADL Phase  Entry  Entry  Exit   SOB Score total  61  --  --     CAT Score   CAT Score  15  --  --     mMRC Score   mMRC Score  --  2  2      Initial Exercise Prescription: Initial Exercise Prescription - 04/14/19 1100      Date of Initial Exercise RX and Referring Provider   Date  04/14/19       Discharge Exercise Prescription (Final Exercise Prescription Changes): Exercise Prescription Changes - 05/12/19 1200      Response to Exercise   Blood Pressure (Admit)  136/84    Blood Pressure (Exercise)  146/72    Blood Pressure (Exit)  136/70    Heart Rate (Admit)  109 bpm    Heart Rate (Exercise)  116 bpm    Heart Rate (Exit)  92 bpm    Oxygen Saturation (Admit)  96 %    Oxygen Saturation (Exercise)  93 %    Oxygen Saturation (Exit)  96 %    Rating of Perceived Exertion (Exercise)  13    Perceived Dyspnea (Exercise)  2    Duration  Continue with 30 min of aerobic exercise without signs/symptoms of physical distress.    Intensity  THRR unchanged      Progression   Progression  Continue to progress workloads to maintain  intensity without signs/symptoms of physical distress.      Resistance Training   Training Prescription  Yes    Weight  blue bands    Reps  10-15    Time  10 Minutes      Oxygen   Oxygen  Continuous    Liters  2      NuStep   Level  3    SPM  80    Minutes  15    METs  2      Arm Ergometer   Level  2.5    Minutes  15       Functional Capacity: 6 Minute Walk    Row Name 03/09/19 1041 05/26/19 1153       6 Minute Walk   Phase  Initial  Discharge    Distance  1052 feet  1242 feet    Distance % Change  --  18.06 %    Distance Feet Change  --  190 ft    Walk Time  4.83 minutes  6 minutes    # of Rest Breaks  1  0  MPH  1.99  2.35    METS  2.84  2.94    RPE  13  12    Perceived Dyspnea   3  2    VO2 Peak  9.91  10.29    Symptoms  Yes (comment)  No    Comments  1 min 10 sec standing rest due to desaturation. Recovered on 2L.  --    Resting HR  96 bpm  97 bpm    Resting BP  131/62  136/76    Resting Oxygen Saturation   94 %  97 %    Exercise Oxygen Saturation  during 6 min walk  84 %  90 %    Max Ex. HR  126 bpm  126 bpm    Max Ex. BP  180/82  152/74    2 Minute Post BP  165/83  132/74      Interval HR   1 Minute HR  117  108    2 Minute HR  126  111    3 Minute HR  126  112    4 Minute HR  110  117    5 Minute HR  116  122    6 Minute HR  126  126    2 Minute Post HR  106  100    Interval Heart Rate?  Yes  Yes      Interval Oxygen   Interval Oxygen?  Yes  Yes    Baseline Oxygen Saturation %  94 %  97 %    1 Minute Oxygen Saturation %  93 %  95 %    1 Minute Liters of Oxygen  0 L  2 L    2 Minute Oxygen Saturation %  88 %  93 %    2 Minute Liters of Oxygen  0 L  2 L    3 Minute Oxygen Saturation %  84 %  92 %    3 Minute Liters of Oxygen  0 L  2 L    4 Minute Oxygen Saturation %  92 %  91 %    4 Minute Liters of Oxygen  2 L  2 L    5 Minute Oxygen Saturation %  95 %  90 %    5 Minute Liters of Oxygen  2 L  2 L    6 Minute Oxygen Saturation %  91 %   90 %    6 Minute Liters of Oxygen  2 L  2 L    2 Minute Post Oxygen Saturation %  97 %  98 %    2 Minute Post Liters of Oxygen  0 L  2 L       Psychological, QOL, Others - Outcomes: PHQ 2/9: Depression screen PHQ 2/9 03/09/2019  Decreased Interest 0  Down, Depressed, Hopeless 0  PHQ - 2 Score 0  Altered sleeping 0  Tired, decreased energy 0  Change in appetite 0  Feeling bad or failure about yourself  0  Moving slowly or fidgety/restless 0  Suicidal thoughts 0  PHQ-9 Score 0  Difficult doing work/chores Not difficult at all    Quality of Life:   Personal Goals: Goals established at orientation with interventions provided to work toward goal. Personal Goals and Risk Factors at Admission - 03/09/19 1102      Core Components/Risk Factors/Patient Goals on Admission   Improve shortness of breath with ADL's  Yes    Intervention  Provide education, individualized exercise plan and daily activity instruction to help decrease symptoms of SOB with activities of daily living.    Expected Outcomes  Short Term: Improve cardiorespiratory fitness to achieve a reduction of symptoms when performing ADLs;Long Term: Be able to perform more ADLs without symptoms or delay the onset of symptoms    Stress  Yes    Intervention  --   in hopes that exercise will help relieve stress       Personal Goals Discharge: Goals and Risk Factor Review    Row Name 03/09/19 1103 03/30/19 1330 04/27/19 1326 05/26/19 0858       Core Components/Risk Factors/Patient Goals Review   Personal Goals Review  Develop more efficient breathing techniques such as purse lipped breathing and diaphragmatic breathing and practicing self-pacing with activity.;Increase knowledge of respiratory medications and ability to use respiratory devices properly.;Improve shortness of breath with ADL's;Stress;Lipids  Develop more efficient breathing techniques such as purse lipped breathing and diaphragmatic breathing and practicing  self-pacing with activity.;Increase knowledge of respiratory medications and ability to use respiratory devices properly.;Improve shortness of breath with ADL's  Develop more efficient breathing techniques such as purse lipped breathing and diaphragmatic breathing and practicing self-pacing with activity.;Increase knowledge of respiratory medications and ability to use respiratory devices properly.;Improve shortness of breath with ADL's  Improve shortness of breath with ADL's;Develop more efficient breathing techniques such as purse lipped breathing and diaphragmatic breathing and practicing self-pacing with activity.;Increase knowledge of respiratory medications and ability to use respiratory devices properly.    Review  --  Mel has attended 4 exercise sessions and has required supplemental oxygen @ 2L/min.  He has been reluctant to have his doctor contact a DME to provide this.  He has investigated portable oxygen concentrators on his own to decide which machine he would like to order.  I feel he is closer to accepting that he requires supplemental oxygen.  He has been slow to progress and very deconditioned, but gives it his best, level 2-3 of nustep depending on the day and level 2.5 of the arm ergomenter.  Mel graduates today, he has done well, has transitioned to wearing oxygen when doing anything strenuous, he want to be referred to the Prep Program at the [facility] which we will do.    Expected Outcomes  --  See admission goals.  See admission goals.  See admission goals.       Exercise Goals and Review: Exercise Goals    Row Name 03/09/19 1102 03/31/19 0804 04/28/19 0754 05/26/19 0828       Exercise Goals   Increase Physical Activity  Yes  Yes  Yes  Yes    Intervention  Provide advice, education, support and counseling about physical activity/exercise needs.;Develop an individualized exercise prescription for aerobic and resistive training based on initial evaluation findings, risk  stratification, comorbidities and participant's personal goals.  Provide advice, education, support and counseling about physical activity/exercise needs.;Develop an individualized exercise prescription for aerobic and resistive training based on initial evaluation findings, risk stratification, comorbidities and participant's personal goals.  Provide advice, education, support and counseling about physical activity/exercise needs.;Develop an individualized exercise prescription for aerobic and resistive training based on initial evaluation findings, risk stratification, comorbidities and participant's personal goals.  Provide advice, education, support and counseling about physical activity/exercise needs.;Develop an individualized exercise prescription for aerobic and resistive training based on initial evaluation findings, risk stratification, comorbidities and participant's personal goals.    Expected Outcomes  Short Term: Attend rehab  on a regular basis to increase amount of physical activity.;Long Term: Add in home exercise to make exercise part of routine and to increase amount of physical activity.;Long Term: Exercising regularly at least 3-5 days a week.  Short Term: Attend rehab on a regular basis to increase amount of physical activity.;Long Term: Add in home exercise to make exercise part of routine and to increase amount of physical activity.;Long Term: Exercising regularly at least 3-5 days a week.  Short Term: Attend rehab on a regular basis to increase amount of physical activity.;Long Term: Add in home exercise to make exercise part of routine and to increase amount of physical activity.;Long Term: Exercising regularly at least 3-5 days a week.  Short Term: Attend rehab on a regular basis to increase amount of physical activity.;Long Term: Add in home exercise to make exercise part of routine and to increase amount of physical activity.;Long Term: Exercising regularly at least 3-5 days a week.     Increase Strength and Stamina  Yes  Yes  Yes  Yes    Intervention  Provide advice, education, support and counseling about physical activity/exercise needs.;Develop an individualized exercise prescription for aerobic and resistive training based on initial evaluation findings, risk stratification, comorbidities and participant's personal goals.  Provide advice, education, support and counseling about physical activity/exercise needs.;Develop an individualized exercise prescription for aerobic and resistive training based on initial evaluation findings, risk stratification, comorbidities and participant's personal goals.  Provide advice, education, support and counseling about physical activity/exercise needs.;Develop an individualized exercise prescription for aerobic and resistive training based on initial evaluation findings, risk stratification, comorbidities and participant's personal goals.  Provide advice, education, support and counseling about physical activity/exercise needs.;Develop an individualized exercise prescription for aerobic and resistive training based on initial evaluation findings, risk stratification, comorbidities and participant's personal goals.    Expected Outcomes  Short Term: Increase workloads from initial exercise prescription for resistance, speed, and METs.;Short Term: Perform resistance training exercises routinely during rehab and add in resistance training at home;Long Term: Improve cardiorespiratory fitness, muscular endurance and strength as measured by increased METs and functional capacity (6MWT)  Short Term: Increase workloads from initial exercise prescription for resistance, speed, and METs.;Short Term: Perform resistance training exercises routinely during rehab and add in resistance training at home;Long Term: Improve cardiorespiratory fitness, muscular endurance and strength as measured by increased METs and functional capacity (6MWT)  Short Term: Increase workloads  from initial exercise prescription for resistance, speed, and METs.;Short Term: Perform resistance training exercises routinely during rehab and add in resistance training at home;Long Term: Improve cardiorespiratory fitness, muscular endurance and strength as measured by increased METs and functional capacity (6MWT)  Short Term: Increase workloads from initial exercise prescription for resistance, speed, and METs.;Short Term: Perform resistance training exercises routinely during rehab and add in resistance training at home;Long Term: Improve cardiorespiratory fitness, muscular endurance and strength as measured by increased METs and functional capacity (6MWT)    Able to understand and use rate of perceived exertion (RPE) scale  Yes  Yes  Yes  Yes    Intervention  Provide education and explanation on how to use RPE scale  Provide education and explanation on how to use RPE scale  Provide education and explanation on how to use RPE scale  Provide education and explanation on how to use RPE scale    Expected Outcomes  Short Term: Able to use RPE daily in rehab to express subjective intensity level;Long Term:  Able to use RPE to guide intensity  level when exercising independently  Short Term: Able to use RPE daily in rehab to express subjective intensity level;Long Term:  Able to use RPE to guide intensity level when exercising independently  Short Term: Able to use RPE daily in rehab to express subjective intensity level;Long Term:  Able to use RPE to guide intensity level when exercising independently  Short Term: Able to use RPE daily in rehab to express subjective intensity level;Long Term:  Able to use RPE to guide intensity level when exercising independently    Able to understand and use Dyspnea scale  Yes  Yes  Yes  Yes    Intervention  Provide education and explanation on how to use Dyspnea scale  Provide education and explanation on how to use Dyspnea scale  Provide education and explanation on how to  use Dyspnea scale  Provide education and explanation on how to use Dyspnea scale    Expected Outcomes  Short Term: Able to use Dyspnea scale daily in rehab to express subjective sense of shortness of breath during exertion;Long Term: Able to use Dyspnea scale to guide intensity level when exercising independently  Short Term: Able to use Dyspnea scale daily in rehab to express subjective sense of shortness of breath during exertion;Long Term: Able to use Dyspnea scale to guide intensity level when exercising independently  Short Term: Able to use Dyspnea scale daily in rehab to express subjective sense of shortness of breath during exertion;Long Term: Able to use Dyspnea scale to guide intensity level when exercising independently  Short Term: Able to use Dyspnea scale daily in rehab to express subjective sense of shortness of breath during exertion;Long Term: Able to use Dyspnea scale to guide intensity level when exercising independently    Knowledge and understanding of Target Heart Rate Range (THRR)  Yes  Yes  Yes  Yes    Intervention  Provide education and explanation of THRR including how the numbers were predicted and where they are located for reference  Provide education and explanation of THRR including how the numbers were predicted and where they are located for reference  Provide education and explanation of THRR including how the numbers were predicted and where they are located for reference  Provide education and explanation of THRR including how the numbers were predicted and where they are located for reference    Expected Outcomes  Short Term: Able to state/look up THRR;Short Term: Able to use daily as guideline for intensity in rehab;Long Term: Able to use THRR to govern intensity when exercising independently  Short Term: Able to state/look up THRR;Short Term: Able to use daily as guideline for intensity in rehab;Long Term: Able to use THRR to govern intensity when exercising independently   Short Term: Able to state/look up THRR;Short Term: Able to use daily as guideline for intensity in rehab;Long Term: Able to use THRR to govern intensity when exercising independently  Short Term: Able to state/look up THRR;Short Term: Able to use daily as guideline for intensity in rehab;Long Term: Able to use THRR to govern intensity when exercising independently    Understanding of Exercise Prescription  Yes  Yes  Yes  Yes    Intervention  Provide education, explanation, and written materials on patient's individual exercise prescription  Provide education, explanation, and written materials on patient's individual exercise prescription  Provide education, explanation, and written materials on patient's individual exercise prescription  Provide education, explanation, and written materials on patient's individual exercise prescription    Expected Outcomes  Short  Term: Able to explain program exercise prescription;Long Term: Able to explain home exercise prescription to exercise independently  Short Term: Able to explain program exercise prescription;Long Term: Able to explain home exercise prescription to exercise independently  Short Term: Able to explain program exercise prescription;Long Term: Able to explain home exercise prescription to exercise independently  Short Term: Able to explain program exercise prescription;Long Term: Able to explain home exercise prescription to exercise independently       Exercise Goals Re-Evaluation: Exercise Goals Re-Evaluation    Row Name 03/31/19 0804 04/28/19 0754 05/26/19 0828         Exercise Goal Re-Evaluation   Exercise Goals Review  Increase Physical Activity;Increase Strength and Stamina;Able to understand and use rate of perceived exertion (RPE) scale;Able to understand and use Dyspnea scale;Knowledge and understanding of Target Heart Rate Range (THRR);Understanding of Exercise Prescription  Increase Physical Activity;Increase Strength and Stamina;Able  to understand and use rate of perceived exertion (RPE) scale;Able to understand and use Dyspnea scale;Knowledge and understanding of Target Heart Rate Range (THRR);Understanding of Exercise Prescription  Increase Physical Activity;Increase Strength and Stamina;Able to understand and use rate of perceived exertion (RPE) scale;Able to understand and use Dyspnea scale;Knowledge and understanding of Target Heart Rate Range (THRR);Understanding of Exercise Prescription     Comments  Pt has completed 4 exercise sessions. He was recently approved for a POC at home, so he will be able to safely exercise at home. Pt is motivated and is progressing slowly. He currently exercises at 2.0 METs on the stepper. Will continue to monitor and progress as able.  Pt has completed 11 exercise sessions. He now has his POC and can exercise at home. He is currently exercising at 2.1 METs on the stepper. Will continue to monitor and progress as able.  Pt has completed 15 exercise sessions. He will do his walk test today to graduate the program.     Expected Outcomes  Through exercise at rehab and at home, the patient will decrease shortness of breath with daily activities and feel confident in carrying out an exercise regime at home.  Through exercise at rehab and at home, the patient will decrease shortness of breath with daily activities and feel confident in carrying out an exercise regime at home.  Through exercise at rehab and at home, the patient will decrease shortness of breath with daily activities and feel confident in carrying out an exercise regime at home.        Nutrition & Weight - Outcomes: Pre Biometrics - 03/09/19 1224      Pre Biometrics   Height  _0  (1.778 m)    Weight  104.1 kg    BMI (Calculated)  32.93    Grip Strength  27 kg        Nutrition: Nutrition Therapy & Goals - 03/19/19 1201      Nutrition Therapy   Diet  low Sodium      Personal Nutrition Goals   Nutrition Goal  Pt to identify  and limit food sources of saturated fat, trans fat, refined carbohydrates and sodium    Personal Goal #2  Pt to begin reading labels      Intervention Plan   Intervention  Prescribe, educate and counsel regarding individualized specific dietary modifications aiming towards targeted core components such as weight, hypertension, lipid management, diabetes, heart failure and other comorbidities.    Expected Outcomes  Short Term Goal: A plan has been developed with personal nutrition goals set during dietitian appointment.  Nutrition Discharge: Nutrition Assessments - 03/19/19 1202      Rate Your Plate Scores   Pre Score  44       Education Questionnaire Score: Knowledge Questionnaire Score - 03/09/19 1012      Knowledge Questionnaire Score   Pre Score  15/18       Goals reviewed with patient; copy given to patient.

## 2019-06-09 NOTE — Progress Notes (Signed)
[place] Report   Patient Details  Name: [name] [name] MRN: [id number] Date of Birth: 10-02-1945 Age: 74 y.o. PCP: Lujean Amel, MD  Vitals:   06/09/19 1633  BP: 130/78  Pulse: 88  SpO2: 93%  Weight: 226 lb 6.4 oz (102.7 kg)     [name] Eval - 06/09/19 1600      Referral    Referring Provider  Pulm Rehab     Reason for referral  Other   build on Pulm rehab    Program Start Date  06/16/19      Measurement   Neck measurement  17 Inches    Waist Circumference  46.5 inches    Body fat  38.6 percent      Information for Trainer   Goals  Restart ex 2-3x k    Current Exercise  walking, bands     Orthopedic Concerns  ankle Fx right     Pertinent Medical History  COPD, Neuropathy, HTN    Current Barriers  Maybe out of town     Restrictions/Precautions  Assistive device    Medications that affect exercise  Medication causing dizziness/drowsiness;Oxgen      Timed Up and Go (TUGS)   Timed Up and Go  --   fall risk due to neuropathy      Mobility and Daily Activities   I find it easy to walk up or down two or more flights of stairs.  2    I have no trouble taking out the trash.  4    I do housework such as vacuuming and dusting on my own without difficulty.  2    I can easily lift a gallon of milk (8lbs).  4    I can easily walk a mile.  1    I have no trouble reaching into high cupboards or reaching down to pick up something from the floor.  4    I do not have trouble doing out-door work such as Armed forces logistics/support/administrative officer, raking leaves, or gardening.  1      Mobility and Daily Activities   I feel younger than my age.  4    I feel independent.  4    I feel energetic.  3    I live an active life.   2    I feel strong.  2    I feel healthy.  4    I feel active as other people my age.  2      How fit and strong are you.   Fit and Strong Total Score  39      Past Medical History:  Diagnosis Date  . COPD (chronic obstructive pulmonary disease)  ([place])    Past Surgical History:  Procedure Laterality Date  . NASAL SINUS SURGERY  2007   Social History   Tobacco Use  Smoking Status Former Smoker  . Packs/day: 2.00  . Years: 40.00  . Pack years: 80.00  . Types: Cigarettes  . Quit date: 2010  . Years since quitting: 11.4  Smokeless Tobacco Never Used   Will start 6/8 in PREP every Tues/Thurs from 1p-215pm Encouraged to bring O2 with him to exercise, as well as cane for safe ambulation.      [name] 06/09/2019, 4:38 PM

## 2019-06-23 NOTE — Progress Notes (Signed)
[facility] PREP Weekly Session   Patient Details  Name: NATHANYEL DEFENBAUGH MRN: [id number] Date of Birth: 1945-02-01 Age: 74 y.o. PCP: Lujean Amel, MD  Vitals:   06/23/19 1614  BP: (!) 148/68  Weight: 225 lb (102.1 kg)     [name] Weekly seesion - 06/23/19 1600      Weekly Session   Topic Discussed Importance of resistance training;Other ways to be active    Minutes exercised this week 80 minutes    Classes attended to date 3         Fun things: looking at houses Grateful for: family and friends Nutrition celebration: more fruits/less salt  Barriers/struggles: bathing, walking long distance, worry about things    [name] 06/23/2019, 4:16 PM

## 2019-06-30 NOTE — Progress Notes (Signed)
[facility] PREP Weekly Session   Patient Details  Name: [name] [name] MRN: [id number] Date of Birth: 10/06/1945 Age: 74 y.o. PCP: Lujean Amel, MD  Vitals:   06/30/19 1452  Weight: 226 lb (102.5 kg)     [name] Weekly seesion - 06/30/19 1400      Weekly Session   Topic Discussed Healthy eating tips   Sugar brochure given   Minutes exercised this week 105 minutes    Classes attended to date 5          Fun things since last meeting: Father's Day with kids Grateful for: able to have good friends together Nutrition celebration: more green things Barriers/struggles: bathing, need more exercise   [name] 06/30/2019, 2:53 PM

## 2019-07-02 DIAGNOSIS — J439 Emphysema, unspecified: Secondary | ICD-10-CM | POA: Diagnosis not present

## 2019-07-02 DIAGNOSIS — J42 Unspecified chronic bronchitis: Secondary | ICD-10-CM | POA: Diagnosis not present

## 2019-07-02 DIAGNOSIS — J449 Chronic obstructive pulmonary disease, unspecified: Secondary | ICD-10-CM | POA: Diagnosis not present

## 2019-07-02 DIAGNOSIS — J9611 Chronic respiratory failure with hypoxia: Secondary | ICD-10-CM | POA: Diagnosis not present

## 2019-07-03 ENCOUNTER — Other Ambulatory Visit: Payer: Self-pay

## 2019-07-03 ENCOUNTER — Ambulatory Visit (INDEPENDENT_AMBULATORY_CARE_PROVIDER_SITE_OTHER)
Admission: RE | Admit: 2019-07-03 | Discharge: 2019-07-03 | Disposition: A | Discharge status: Home / Self Care | Payer: PPO | Source: Ambulatory Visit | Attending: Acute Care | Admitting: Acute Care

## 2019-07-03 DIAGNOSIS — Z87891 Personal history of nicotine dependence: Secondary | ICD-10-CM

## 2019-07-03 DIAGNOSIS — Z122 Encounter for screening for malignant neoplasm of respiratory organs: Secondary | ICD-10-CM

## 2019-07-07 NOTE — Progress Notes (Signed)
[facility] PREP Weekly Session   Patient Details  Name: [name] [name] MRN: [id number] Date of Birth: [birth year] Age: 74 y.o. PCP: Lujean Amel, MD  Vitals:   07/07/19 1645  Weight: 224 lb (101.6 kg)     [name] Weekly seesion - 07/07/19 1600      Weekly Session   Topic Discussed Health habits    Minutes exercised this week 170 minutes    Classes attended to date 49         Grateful for: grill out with daughter Grateful for: moving to new apartment Nutrition celebration: None Barriers/struggles: bathing, house cleaning, walking distances    [name] 07/07/2019, 4:46 PM

## 2019-07-09 ENCOUNTER — Other Ambulatory Visit: Payer: Self-pay | Admitting: *Deleted

## 2019-07-09 DIAGNOSIS — Z87891 Personal history of nicotine dependence: Secondary | ICD-10-CM

## 2019-07-09 NOTE — Progress Notes (Signed)
Please call patient and let them  know their  low dose Ct was read as a Lung RADS 2: nodules that are benign in appearance and behavior with a very low likelihood of becoming a clinically active cancer due to size or lack of growth. Recommendation per radiology is for a repeat LDCT in 12 months. .Please let them  know we will order and schedule their  annual screening scan for 06/2020. Please let them  know there was notation of CAD on their  scan.  Please remind the patient  that this is a non-gated exam therefore degree or severity of disease  cannot be determined. Please have them  follow up with their PCP regarding potential risk factor modification, dietary therapy or pharmacologic therapy if clinically indicated. Pt.  is  currently on statin therapy. Please place order for annual  screening scan for  06/2020 and fax results to PCP. Thanks so much.  [name] [name], patient had a echo last in 2014. It does not look like they see cards regularly. Please have them follow up with PCP. Thanks

## 2019-07-16 ENCOUNTER — Telehealth: Payer: Self-pay | Admitting: Primary Care

## 2019-07-16 NOTE — Telephone Encounter (Signed)
Patient is wanting recommendation on inhalers he was on Anoro prior to being on Trelegy. Trelegy is about a  $100.00 difference than the anoro.  He states he feels about the same on either inhaler and would like to know if with this BW feels he should just stay on the trelegy or go back to anoro. Or maybe he needs a better fit inhaler. C/o sob but not worse then prior OV.   BW please advise

## 2019-07-17 MED ORDER — BREZTRI AEROSPHERE 160-9-4.8 MCG/ACT IN AERO: 2.0000 | INHALATION_SPRAY | Freq: Two times a day (BID) | RESPIRATORY_TRACT | 0 refills | Status: DC

## 2019-07-17 NOTE — Telephone Encounter (Signed)
We can do a BIV when [name] [name] returns on Monday.  In order to qualify for Trelegy patient assistance he would need to meet the income requirements AND spent $600 out of pocket on prescriptions.  Breztri patient assistance has no out of pocket spending requirements and may be the best option if he has not met the out of pocket spending for Trelegy PAP.  Museum/gallery conservator

## 2019-07-17 NOTE — Telephone Encounter (Signed)
Called and spoke with pt letting him know the info stated by both Eustaquio Maize as well as Museum/gallery conservator, pharmacist. After stating that info to pt, pt stated that he would like to switch to [employer]. I stated to pt that we can provide him with samples for him to try and then also start patient assistance paperwork. Pt verbalized understanding and stated that he will come by office to pick up. All has been placed up front for pt. Nothing further needed.

## 2019-07-17 NOTE — Telephone Encounter (Signed)
I believe that is for a three month supply. 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   Would either see about patient assistance for Trelegy or do a benefits investigation for [employer]  Cc: pharmacy

## 2019-07-17 NOTE — Telephone Encounter (Signed)
Thanks! Nira Conn can you see if patient has spent 600 out of pocked on prescriptions. If not then likely change to [facility]- we can give him a couple samples first to try

## 2019-07-20 NOTE — Telephone Encounter (Signed)
Ran test claim for 1 month supply:  Trelegy- $45.00 Breztri-$45.00

## 2019-07-20 NOTE — Telephone Encounter (Signed)
Called and spoke with Patient.  Beth,NP's recommendations given.  Understanding stated.  Patient aware Raquel Sarna placed samples at front desk for pick up.   Nothing further at this time.

## 2019-07-20 NOTE — Telephone Encounter (Signed)
Please let patient know Judithann Sauger is 45 dollars a month. If he can not afford this please help patient with patient assistance

## 2019-07-28 NOTE — Progress Notes (Signed)
[facility] PREP Weekly Session   Patient Details  Name: [name] [name] MRN: [id number] Date of Birth: 19-Oct-1945 Age: 74 y.o. PCP: Lujean Amel, MD  Vitals:   07/28/19 1553  Weight: 223 lb (101.2 kg)     [name] Weekly sees[facility] - 07/28/19 1500      Weekly Session   Topic Discussed Expectations and non-scale victories    Minutes exercised this week 150 minutes    Classes attended to date 24          Fun things you did since last meeting: move to new apt.  Grateful for: being here Barriers/struggles: breathing in humidity, walking long distances   [name] 07/28/2019, 3:54 PM

## 2019-07-31 DIAGNOSIS — M7020 Olecranon bursitis, unspecified elbow: Secondary | ICD-10-CM | POA: Diagnosis not present

## 2019-08-01 DIAGNOSIS — J449 Chronic obstructive pulmonary disease, unspecified: Secondary | ICD-10-CM | POA: Diagnosis not present

## 2019-08-01 DIAGNOSIS — J42 Unspecified chronic bronchitis: Secondary | ICD-10-CM | POA: Diagnosis not present

## 2019-08-01 DIAGNOSIS — J439 Emphysema, unspecified: Secondary | ICD-10-CM | POA: Diagnosis not present

## 2019-08-01 DIAGNOSIS — J9611 Chronic respiratory failure with hypoxia: Secondary | ICD-10-CM | POA: Diagnosis not present

## 2019-08-03 ENCOUNTER — Telehealth: Payer: Self-pay | Admitting: Primary Care

## 2019-08-03 MED ORDER — BREZTRI AEROSPHERE 160-9-4.8 MCG/ACT IN AERO: 2.0000 | INHALATION_SPRAY | Freq: Two times a day (BID) | RESPIRATORY_TRACT | 0 refills | Status: DC

## 2019-08-03 NOTE — Telephone Encounter (Signed)
Yes we can continue additional smaples for 2 weeks to determine if he notices an improvement on this inhaler. Make sure he is using two puffs twice a day. May benefit from aerochamber if he doesn't have one

## 2019-08-03 NOTE — Telephone Encounter (Signed)
Called spoke with patient. HE states he is not sure if the inhaler is working or if the air quality is just that bad. He does not see an improvement in his breathing half way into the 2nd week sample.  Patient wondering if he can get another 2 week sample course to determine if it is working?  Beth Please advise

## 2019-08-03 NOTE — Telephone Encounter (Signed)
Spoke with patient and advised of Beth's instructions.  Samples of Breztri and an aerochamber left at the front desk for patient to pick up.  Advised patient that a staff member will have to show him how to use the aerochamber.  He verbalized understanding.  Nothing further needed.

## 2019-08-04 ENCOUNTER — Telehealth: Payer: Self-pay | Admitting: Primary Care

## 2019-08-04 NOTE — Progress Notes (Signed)
[facility] PREP Weekly Session   Patient Details  Name: [name] [name] MRN: [id number] Date of Birth: March 21, 1945 Age: 73 y.o. PCP: Lujean Amel, MD  Vitals:   08/04/19 1627  Weight: (!) 224 lb (101.6 kg)     [name] Weekly seesion - 08/04/19 1600      Weekly Session   Topic Discussed --   portion control   Minutes exercised this week 170 minutes    Classes attended to date 60          Fun things since last meeting: out with friends Nutrition celebration: lower sugar intake Barriers/struggles: breathing    [name] 08/04/2019, 4:28 PM

## 2019-08-04 NOTE — Telephone Encounter (Signed)
Went to [facility] and spoke with patient. Went to foyer area and demonstrated how to use inhaler as well as showed him how to use spacer with inhaler. Had patient show me how to put inhaler in spacer and also demonstrate before leaving. All questions answered for patient. Nothing further needed at this time.

## 2019-08-07 DIAGNOSIS — F419 Anxiety disorder, unspecified: Secondary | ICD-10-CM | POA: Diagnosis not present

## 2019-08-07 DIAGNOSIS — E7800 Pure hypercholesterolemia, unspecified: Secondary | ICD-10-CM | POA: Diagnosis not present

## 2019-08-07 DIAGNOSIS — J449 Chronic obstructive pulmonary disease, unspecified: Secondary | ICD-10-CM | POA: Diagnosis not present

## 2019-08-07 DIAGNOSIS — M7021 Olecranon bursitis, right elbow: Secondary | ICD-10-CM | POA: Diagnosis not present

## 2019-08-07 DIAGNOSIS — I10 Essential (primary) hypertension: Secondary | ICD-10-CM | POA: Diagnosis not present

## 2019-08-11 NOTE — Progress Notes (Signed)
[facility] PREP Weekly Session   Patient Details  Name: [name] MRN: [id number] Date of Birth: 04-10-1945 Age: 74 y.o. PCP: Lujean Amel, MD  Vitals:   08/11/19 1601  Weight: 223 lb (101.2 kg)     [name] Weekly seesion - 08/11/19 1600      Weekly Session   Topic Discussed Finding support    Minutes exercised this week 135 minutes    Classes attended to date 29          Fun things since last meeting: visit with sister and cousin Nutrition celebration: more vegs in diet Barriers/struggles: housecleaning and long walks   [name] 08/11/2019, 4:02 PM

## 2019-08-12 ENCOUNTER — Encounter: Payer: Self-pay | Admitting: Cardiovascular Disease

## 2019-08-12 ENCOUNTER — Telehealth: Payer: Self-pay | Admitting: Cardiovascular Disease

## 2019-08-12 ENCOUNTER — Other Ambulatory Visit: Payer: Self-pay

## 2019-08-12 ENCOUNTER — Ambulatory Visit: Payer: PPO | Admitting: Cardiovascular Disease

## 2019-08-12 VITALS — BP 134/62 | HR 78 | Ht 71.0 in | Wt 222.6 lb

## 2019-08-12 DIAGNOSIS — R0600 Dyspnea, unspecified: Secondary | ICD-10-CM | POA: Diagnosis not present

## 2019-08-12 DIAGNOSIS — I10 Essential (primary) hypertension: Secondary | ICD-10-CM | POA: Diagnosis not present

## 2019-08-12 DIAGNOSIS — E782 Mixed hyperlipidemia: Secondary | ICD-10-CM

## 2019-08-12 DIAGNOSIS — E785 Hyperlipidemia, unspecified: Secondary | ICD-10-CM | POA: Insufficient documentation

## 2019-08-12 DIAGNOSIS — I2510 Atherosclerotic heart disease of native coronary artery without angina pectoris: Secondary | ICD-10-CM | POA: Insufficient documentation

## 2019-08-12 DIAGNOSIS — Z01812 Encounter for preprocedural laboratory examination: Secondary | ICD-10-CM | POA: Diagnosis not present

## 2019-08-12 DIAGNOSIS — R0989 Other specified symptoms and signs involving the circulatory and respiratory systems: Secondary | ICD-10-CM

## 2019-08-12 MED ORDER — METOPROLOL TARTRATE 100 MG PO TABS
ORAL_TABLET | ORAL | 0 refills | Status: DC
Start: 2019-08-12 — End: 2019-09-04

## 2019-08-12 NOTE — Assessment & Plan Note (Signed)
History of essential hypertension with blood pressure measured today 134/62.  He is on amlodipine.

## 2019-08-12 NOTE — Patient Instructions (Addendum)
Medication Instructions:  Your physician recommends that you continue on your current medications as directed. Please refer to the Current Medication list given to you today.  *If you need a refill on your cardiac medications before your next appointment, please call your pharmacy*   Lab Work: BMET to be completed 1 week prior to CT test  If you have labs (blood work) drawn today and your tests are completely normal, you will receive your results only by: Marland Kitchen MyChart Message (if you have MyChart) OR . A paper copy in the mail If you have any lab test that is abnormal or we need to change your treatment, we will call you to review the results.   Testing/Procedures: Carotid Doppler study @ Dr. Kennon Holter office  Coronary CT study @ [facility] Once approved with insurance, you will be called to schedule your test   Follow-Up: At [facility], you and your health needs are our priority.  As part of our continuing mission to provide you with exceptional heart care, we have created designated Provider Care Teams.  These Care Teams include your primary Cardiologist (physician) and Advanced Practice Providers (APPs -  Physician Assistants and Nurse Practitioners) who all work together to provide you with the care you need, when you need it.  We recommend signing up for the patient portal called "MyChart".  Sign up information is provided on this After Visit Summary.  MyChart is used to connect with patients for Virtual Visits (Telemedicine).  Patients are able to view lab/test results, encounter notes, upcoming appointments, etc.  Non-urgent messages can be sent to your provider as well.   To learn more about what you can do with MyChart, go to [email or web address].    Your next appointment:   12 month(s)  The format for your next appointment:   In Person  Provider:   You may see Dr. Gwenlyn Found or one of the following Advanced Practice Providers on your designated Care Team:    Kerin Ransom, PA-C  [place], [place]  Coletta Memos, [place]    Other Instructions  Your cardiac CT will be scheduled at one of the below locations:   [facility] [address] [place], [place] [address] [phone]  [facility] [address] [place], [place] [address] [phone]  If scheduled at [facility], please arrive at the [facility] main entrance of [facility] 30 minutes prior to test start time. Proceed to the [facility] Radiology Department (first floor) to check-in and test prep.  If scheduled at [facility], please arrive 15 mins early for check-in and test prep.  Please follow these instructions carefully (unless otherwise directed):  Hold all erectile dysfunction medications at least 3 days (72 hrs) prior to test.  On the Night Before the Test: . Be sure to Drink plenty of water. . Do not consume any caffeinated/decaffeinated beverages or chocolate 12 hours prior to your test. . Do not take any antihistamines 12 hours prior to your test.  On the Day of the Test: . Drink plenty of water. Do not drink any water within one hour of the test. . Do not eat any food 4 hours prior to the test. . You may take your regular medications prior to the test.  . Take metoprolol (Lopressor) two hours prior to test. . HOLD Furosemide morning of the test.  After the Test: . Drink plenty of water. . After receiving  IV contrast, you may experience a mild flushed feeling. This is normal. . On occasion, you may experience a mild rash up to 24 hours after the test. This is not dangerous. If this occurs, you can take Benadryl 25 mg and increase your fluid intake. . If you experience trouble breathing, this can be serious. If it is severe call 911 IMMEDIATELY. If it is mild, please call our office. . If you take any of these medications: Glipizide/Metformin, Avandament,  Glucavance, please do not take 48 hours after completing test unless otherwise instructed.   Once we have confirmed authorization from your insurance company, we will call you to set up a date and time for your test. Based on how quickly your insurance processes prior authorizations requests, please allow up to 4 weeks to be contacted for scheduling your Cardiac CT appointment. Be advised that routine Cardiac CT appointments could be scheduled as many as 8 weeks after your provider has ordered it.  For non-scheduling related questions, please contact the cardiac imaging nurse navigator should you have any questions/concerns: Marchia Bond, Cardiac Imaging Nurse Navigator Burley Saver, Interim Cardiac Imaging Nurse [facility] Direct Office Dial: [phone]   For scheduling needs, including cancellations and rescheduling, please call Vivien Rota at [phone], option 3.

## 2019-08-12 NOTE — Telephone Encounter (Signed)
Patient ordered coronary CT with FFR study on 08/12/2019 Patient has COPD. Verbal per Dr. Gwenlyn Found, [place] to take 1 time dose of metoprolol tartrate prior to CT

## 2019-08-12 NOTE — Progress Notes (Signed)
08/12/2019 [name] [name]   [birth year]  [id number]  Primary Physician Lujean Amel, MD Primary Cardiologist: Lorretta Harp MD Lupe Carney, [place]  HPI:  [name] [name] is a 74 y.o. weight married Caucasian male father of 2 children, with no grandchildren, who is retired from working at [employer].  He was referred by Dr.[name], his PCP, because of diffuse coronary calcification seen on recent screening chest CT performed 07/03/2019.  His risk factors include 80-pack-year tobacco abuse having quit 12 years ago, treated hypertension and hyperlipidemia.  There is no family history of heart disease.  He is never had a heart attack or stroke.  He denies chest pain but does get short of breath but has COPD as well.  Recent chest CT done for surveillance 07/03/2019 revealed diffuse calcification in all 3 coronary arteries.   Current Meds  Medication Sig  . albuterol (PROVENTIL HFA;VENTOLIN HFA) 108 (90 Base) MCG/ACT inhaler Inhale 2 puffs into the lungs every 6 (six) hours as needed for wheezing or shortness of breath.  . ALPRAZolam (XANAX) 0.25 MG tablet Take 0.25 mg by mouth daily as needed.  Marland Kitchen amLODipine (NORVASC) 5 MG tablet Take 5 mg by mouth daily.   Marland Kitchen atorvastatin (LIPITOR) 20 MG tablet Take 20 mg by mouth daily.   . Budeson-Glycopyrrol-Formoterol (BREZTRI AEROSPHERE) 160-9-4.8 MCG/ACT AERO Inhale 2 puffs into the lungs in the morning and at bedtime.  . Budeson-Glycopyrrol-Formoterol (BREZTRI AEROSPHERE) 160-9-4.8 MCG/ACT AERO Inhale 2 puffs into the lungs in the morning and at bedtime.  . furosemide (LASIX) 20 MG tablet Take 20 mg by mouth daily.  Marland Kitchen ibuprofen (ADVIL,MOTRIN) 200 MG tablet Take 400 mg by mouth every 6 (six) hours as needed.  . loratadine (CLARITIN) 10 MG tablet Take 10 mg by mouth daily as needed for allergies.  . vitamin B-12 (CYANOCOBALAMIN) 1000 MCG tablet Take 1,000 mcg by mouth.     No Known Allergies  Social History   Socioeconomic  History  . Marital status: Married    Spouse name: Not on file  . Number of children: 2  . Years of education: 30  . Highest education level: Not on file  Occupational History  . Occupation: retired  Tobacco Use  . Smoking status: Former Smoker    Packs/day: 2.00    Years: 40.00    Pack years: 80.00    Types: Cigarettes    Quit date: 2010    Years since quitting: 11.5  . Smokeless tobacco: Never Used  Vaping Use  . Vaping Use: Never assessed  Substance and Sexual Activity  . Alcohol use: Yes    Alcohol/week: 12.0 standard drinks    Types: 12 Cans of beer per week  . Drug use: No  . Sexual activity: Not on file  Other Topics Concern  . Not on file  Social History Narrative   Right handed   One story home   Social Determinants of Health   Financial Resource Strain:   . Difficulty of Paying Living Expenses:   Food Insecurity:   . Worried About Charity fundraiser in the Last Year:   . Arboriculturist in the Last Year:   Transportation Needs:   . Film/video editor (Medical):   Marland Kitchen Lack of Transportation (Non-Medical):   Physical Activity:   . Days of Exercise per Week:   . Minutes of Exercise per Session:   Stress:   . Feeling of Stress :   Social Connections:   .  Frequency of Communication with Friends and Family:   . Frequency of Social Gatherings with Friends and Family:   . Attends Religious Services:   . Active Member of Clubs or Organizations:   . Attends Archivist Meetings:   Marland Kitchen Marital Status:   Intimate Partner Violence:   . Fear of Current or Ex-Partner:   . Emotionally Abused:   Marland Kitchen Physically Abused:   . Sexually Abused:      Review of Systems: General: negative for chills, fever, night sweats or weight changes.  Cardiovascular: negative for chest pain, dyspnea on exertion, edema, orthopnea, palpitations, paroxysmal nocturnal dyspnea or shortness of breath Dermatological: negative for rash Respiratory: negative for cough or  wheezing Urologic: negative for hematuria Abdominal: negative for nausea, vomiting, diarrhea, bright red blood per rectum, melena, or hematemesis Neurologic: negative for visual changes, syncope, or dizziness All other systems reviewed and are otherwise negative except as noted above.    Blood pressure 134/62, pulse 78, height 5\' 11"  (1.803 m), weight 222 lb 9.6 oz (101 kg).  General appearance: alert and no distress Neck: no adenopathy, no JVD, supple, symmetrical, trachea midline, thyroid not enlarged, symmetric, no tenderness/mass/nodules and Left carotid bruit Lungs: Occasional scattered wheezes Heart: regular rate and rhythm, S1, S2 normal, no murmur, click, rub or gallop Extremities: extremities normal, atraumatic, no cyanosis or edema Pulses: 2+ and symmetric Skin: Skin color, texture, turgor normal. No rashes or lesions Neurologic: Alert and oriented X 3, normal strength and tone. Normal symmetric reflexes. Normal coordination and gait  EKG sinus rhythm at 78 without ST or T wave changes.  I personally reviewed this EKG.  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 [name] because of coronary calcification seen in all 3 coronary arteries on recent chest CT performed 07/03/2019.  He does get shortness of breath but does have COPD.  I am going to get a coronary CTA to further quantitate whether or not he has obstructive disease.  He denies chest pain.  Essential hypertension History of essential hypertension with blood pressure measured today 134/62.  He is on amlodipine.  Hyperlipidemia History of hyperlipidemia on statin therapy with lipid profile performed 10/28/2018 revealing total cholesterol 140, LDL 47 and HDL of 83.      Lorretta Harp MD FACP,FACC,FAHA, [facility] 08/12/2019 11:49 AM

## 2019-08-12 NOTE — Assessment & Plan Note (Signed)
History of hyperlipidemia on statin therapy with lipid profile performed 10/28/2018 revealing total cholesterol 140, LDL 47 and HDL of 83.

## 2019-08-12 NOTE — Assessment & Plan Note (Signed)
[name] [name] was referred to me by Dr. [name] because of coronary calcification seen in all 3 coronary arteries on recent chest CT performed 07/03/2019.  He does get shortness of breath but does have COPD.  I am going to get a coronary CTA to further quantitate whether or not he has obstructive disease.  He denies chest pain.

## 2019-08-19 ENCOUNTER — Ambulatory Visit (HOSPITAL_COMMUNITY)
Admission: RE | Admit: 2019-08-19 | Discharge: 2019-08-19 | Disposition: A | Discharge status: Home / Self Care | Payer: PPO | Source: Ambulatory Visit | Attending: Cardiology | Admitting: Cardiology

## 2019-08-19 ENCOUNTER — Other Ambulatory Visit: Payer: Self-pay

## 2019-08-19 DIAGNOSIS — R0989 Other specified symptoms and signs involving the circulatory and respiratory systems: Secondary | ICD-10-CM | POA: Diagnosis not present

## 2019-08-19 NOTE — Progress Notes (Signed)
[facility] PREP Weekly Session   Patient Details  Name: DAEMION MCNIEL MRN: [id number] Date of Birth: 1945-09-18 Age: 74 y.o. PCP: Lujean Amel, MD  Vitals:   08/18/19 1300  Weight: 221 lb (100.2 kg)     [name] Weekly seesion - 08/19/19 1000      Weekly Session   Topic Discussed Calorie breakdown    Minutes exercised this week 120 minutes    Classes attended to date 5          Struggles with bathing and endurance.   [name] 08/19/2019, 10:46 AM

## 2019-08-25 NOTE — Progress Notes (Signed)
[facility] PREP Weekly Session   Patient Details  Name: STATON MARKEY MRN: [id number] Date of Birth: May 03, 1945 Age: 74 y.o. PCP: Lujean Amel, MD  Vitals:   08/25/19 1549  Weight: 223 lb (101.2 kg)     [name] Weekly seesion - 08/25/19 1500      Weekly Session   Topic Discussed Hitting roadblocks    Minutes exercised this week --   150   Classes attended to date 76          Nutrition celebration: more veggies Barriers/struggles: walking long distances, cleaning house    [name] 08/25/2019, 3:49 PM

## 2019-08-26 DIAGNOSIS — I2510 Atherosclerotic heart disease of native coronary artery without angina pectoris: Secondary | ICD-10-CM | POA: Diagnosis not present

## 2019-08-26 DIAGNOSIS — Z01812 Encounter for preprocedural laboratory examination: Secondary | ICD-10-CM | POA: Diagnosis not present

## 2019-08-26 DIAGNOSIS — M7021 Olecranon bursitis, right elbow: Secondary | ICD-10-CM | POA: Diagnosis not present

## 2019-08-26 LAB — BASIC METABOLIC PANEL
BUN/Creatinine Ratio: 7 — ABNORMAL LOW (ref 10–24)
BUN: 6 mg/dL — ABNORMAL LOW (ref 8–27)
CO2: 22 mmol/L (ref 20–29)
Calcium: 9.6 mg/dL (ref 8.6–10.2)
Chloride: 93 mmol/L — ABNORMAL LOW (ref 96–106)
Creatinine, Ser: 0.81 mg/dL (ref 0.76–1.27)
GFR calc Af Amer: 103 mL/min/{1.73_m2} (ref >59)
GFR calc non Af Amer: 89 mL/min/{1.73_m2} (ref >59)
Glucose: 80 mg/dL (ref 65–99)
Potassium: 5.7 mmol/L — ABNORMAL HIGH (ref 3.5–5.2)
Sodium: 133 mmol/L — ABNORMAL LOW (ref 134–144)

## 2019-08-27 ENCOUNTER — Other Ambulatory Visit: Payer: Self-pay

## 2019-08-27 DIAGNOSIS — Z01812 Encounter for preprocedural laboratory examination: Secondary | ICD-10-CM

## 2019-08-27 DIAGNOSIS — I2510 Atherosclerotic heart disease of native coronary artery without angina pectoris: Secondary | ICD-10-CM

## 2019-08-28 ENCOUNTER — Other Ambulatory Visit: Payer: Self-pay

## 2019-08-28 DIAGNOSIS — Z01812 Encounter for preprocedural laboratory examination: Secondary | ICD-10-CM | POA: Diagnosis not present

## 2019-08-28 DIAGNOSIS — E875 Hyperkalemia: Secondary | ICD-10-CM

## 2019-08-28 DIAGNOSIS — I2510 Atherosclerotic heart disease of native coronary artery without angina pectoris: Secondary | ICD-10-CM

## 2019-08-28 LAB — BASIC METABOLIC PANEL
BUN/Creatinine Ratio: 14 (ref 10–24)
BUN: 11 mg/dL (ref 8–27)
CO2: 25 mmol/L (ref 20–29)
Calcium: 9.6 mg/dL (ref 8.6–10.2)
Chloride: 93 mmol/L — ABNORMAL LOW (ref 96–106)
Creatinine, Ser: 0.78 mg/dL (ref 0.76–1.27)
GFR calc Af Amer: 105 mL/min/{1.73_m2} (ref >59)
GFR calc non Af Amer: 91 mL/min/{1.73_m2} (ref >59)
Glucose: 77 mg/dL (ref 65–99)
Potassium: 4.6 mmol/L (ref 3.5–5.2)
Sodium: 133 mmol/L — ABNORMAL LOW (ref 134–144)

## 2019-08-31 ENCOUNTER — Telehealth (HOSPITAL_COMMUNITY): Payer: Self-pay | Admitting: Emergency Medicine

## 2019-08-31 NOTE — Telephone Encounter (Signed)
Reaching out to patient to offer assistance regarding upcoming cardiac imaging study; pt verbalizes understanding of appt date/time, parking situation and where to check in, pre-test NPO status and medications ordered, and verified current allergies; name and call back number provided for further questions should they arise Joelle Flessner RN Navigator Cardiac Imaging [facility] [phone] office [phone] cell 

## 2019-09-01 ENCOUNTER — Ambulatory Visit: Payer: PPO | Admitting: Primary Care

## 2019-09-01 ENCOUNTER — Ambulatory Visit: Payer: PPO | Admitting: Adult Health

## 2019-09-01 DIAGNOSIS — J449 Chronic obstructive pulmonary disease, unspecified: Secondary | ICD-10-CM | POA: Diagnosis not present

## 2019-09-01 DIAGNOSIS — J42 Unspecified chronic bronchitis: Secondary | ICD-10-CM | POA: Diagnosis not present

## 2019-09-01 DIAGNOSIS — J9611 Chronic respiratory failure with hypoxia: Secondary | ICD-10-CM | POA: Diagnosis not present

## 2019-09-01 DIAGNOSIS — J439 Emphysema, unspecified: Secondary | ICD-10-CM | POA: Diagnosis not present

## 2019-09-02 ENCOUNTER — Ambulatory Visit (HOSPITAL_COMMUNITY)
Admission: RE | Admit: 2019-09-02 | Discharge: 2019-09-02 | Disposition: A | Discharge status: Home / Self Care | Payer: PPO | Source: Ambulatory Visit | Attending: Cardiovascular Disease | Admitting: Cardiovascular Disease

## 2019-09-02 ENCOUNTER — Other Ambulatory Visit: Payer: Self-pay

## 2019-09-02 DIAGNOSIS — R0600 Dyspnea, unspecified: Secondary | ICD-10-CM | POA: Insufficient documentation

## 2019-09-02 DIAGNOSIS — I2510 Atherosclerotic heart disease of native coronary artery without angina pectoris: Secondary | ICD-10-CM | POA: Insufficient documentation

## 2019-09-02 MED ORDER — METOPROLOL TARTRATE 5 MG/5ML IV SOLN
5.0000 mg | INTRAVENOUS | Status: DC | PRN
  Administered 2019-09-02: 5 mg via INTRAVENOUS

## 2019-09-02 MED ORDER — NITROGLYCERIN 0.4 MG SL SUBL
SUBLINGUAL_TABLET | SUBLINGUAL | Status: AC
  Filled 2019-09-02: qty 2

## 2019-09-02 MED ORDER — IOHEXOL 350 MG/ML SOLN
80.0000 mL | Freq: Once | INTRAVENOUS | Status: AC | PRN
  Administered 2019-09-02: 80 mL via INTRAVENOUS

## 2019-09-02 MED ORDER — METOPROLOL TARTRATE 5 MG/5ML IV SOLN
INTRAVENOUS | Status: AC
  Administered 2019-09-02: 5 mg via INTRAVENOUS
  Filled 2019-09-02: qty 5

## 2019-09-02 MED ORDER — METOPROLOL TARTRATE 5 MG/5ML IV SOLN
INTRAVENOUS | Status: AC
  Filled 2019-09-02: qty 5

## 2019-09-02 MED ORDER — NITROGLYCERIN 0.4 MG SL SUBL
0.8000 mg | SUBLINGUAL_TABLET | SUBLINGUAL | Status: DC | PRN
  Administered 2019-09-02: 0.8 mg via SUBLINGUAL

## 2019-09-04 ENCOUNTER — Encounter: Payer: Self-pay | Admitting: Pulmonary Disease

## 2019-09-04 ENCOUNTER — Other Ambulatory Visit: Payer: Self-pay

## 2019-09-04 ENCOUNTER — Ambulatory Visit: Payer: PPO | Admitting: Pulmonary Disease

## 2019-09-04 VITALS — BP 120/78 | HR 93 | Temp 97.6°F | Ht 71.0 in | Wt 225.8 lb

## 2019-09-04 DIAGNOSIS — J9611 Chronic respiratory failure with hypoxia: Secondary | ICD-10-CM

## 2019-09-04 DIAGNOSIS — Z87891 Personal history of nicotine dependence: Secondary | ICD-10-CM | POA: Diagnosis not present

## 2019-09-04 DIAGNOSIS — J449 Chronic obstructive pulmonary disease, unspecified: Secondary | ICD-10-CM | POA: Diagnosis not present

## 2019-09-04 NOTE — Progress Notes (Signed)
@Patient  ID: [name] [name], male    DOB: 04-Apr-1945, 75 y.o.   MRN: [id number]  Chief Complaint  Patient presents with  . Follow-up    pt has sob when doing activities.pt doing pulm rehab at [facility].pt has seen improvement.pt states having doing brextri 2 puffs daily    Referring provider: Lujean Amel, MD  HPI:  74 year old male former smoker followed in our office for COPD and chronic respiratory failure  PMH: CAD, hypertension, hyperlipidemia Smoker/ Smoking History: Former smoker.  Quit smoking 2010.  80-pack-year smoking history. Maintenance:  Breztri  Pt of: Dr. [name]  09/04/2019  - Visit   74 year old male former smoker followed in our office for COPD and chronic respiratory failure.  Patient presenting to office today.  He reports adherence to his inhalers.  He feels that his shortness of breath is at baseline.  He is currently working with cardiology for evaluation of dyspnea.  He has received his COVID-19 vaccines.  He does not feel that he needs refills of his medications.  He receives his maintenance inhaler from patient assistance program from the pharmaceutical company.  He is in the lung cancer screening program.  Due for repeat CT of chest in June/2022.  Questionaires / Pulmonary Flowsheets:   ACT:  No flowsheet data found.  MMRC: No flowsheet data found.  Epworth:  No flowsheet data found.  Tests:   07/03/2019-CT chest lung cancer screening-multiple small pulmonary nodules are noted, mild to moderate centrilobular and paraseptal emphysema, lung RADS 2S  03/18/2018-spirometry-FVC 2.4 (52% predicted), ratio 61, FEV1 1.5 (43% predicted)  FENO:  No results found for: NITRICOXIDE  PFT: No flowsheet data found.  WALK:  SIX MIN WALK 05/26/2019 03/09/2019  2 Minute Oxygen Saturation % 93 88  2 Minute HR 111 126  4 Minute Oxygen Saturation % 91 92  4 Minute HR 117 110  6 Minute Oxygen Saturation % 90 91  6 Minute HR 126 126    Imaging: CT CORONARY  MORPH W/CTA COR W/SCORE W/CA W/CM &/OR WO/CM  Addendum Date: 09/03/2019   ADDENDUM REPORT: 09/03/2019 08:30 CLINICAL DATA:  22M with shortness of breath EXAM: Cardiac/Coronary CTA TECHNIQUE: The patient was scanned on a [employer]. FINDINGS: A 100 kV prospective scan was triggered in the descending thoracic aorta at 111 HU's. Axial non-contrast 3 mm slices were carried out through the heart. The data set was analyzed on a dedicated work station and scored using the [place]. Gantry rotation speed was 250 msecs and collimation was .6 mm. No beta blockade and 0.8 mg of sl NTG was given. The 3D data set was reconstructed in 5% intervals of the 67-82 % of the R-R cycle. Diastolic phases were analyzed on a dedicated work station using MPR, MIP and VRT modes. The patient received 80 cc of contrast. Coronary Arteries:  Normal coronary origin.  Right dominance. RCA is small and nondominant. There is calcified plaque in the mid RCA causing 0-24% stenosis Left main is a large artery that gives rise to LAD and LCX arteries. There is calcified plaque in the left main causing 25-49% stenosis. LAD is a large vessel. Calcified plaque in the proximal LAD causes 25-49% stenosis LCX is a dominant artery that gives rise to one large OM1 branch. There is calcified plaque in the proximal LCX causing 0-24% stenosis. There is noncalcified plaque in the distal LCx causing 50-69% stenosis. CTFFR across lesion is 0.78. There is calcified plaque in the left PDA causing 25-49% stenosis,  CT FFR across lesion is 0.66. Other findings: Left Ventricle: Normal size Left Atrium: Normal size Pulmonary Veins: Normal configuration Right Ventricle: Normal size Right Atrium: Mild enlargement Cardiac valves: Mild AV calcifications Thoracic aorta: Normal size Pulmonary Arteries: Normal size Systemic Veins: Normal drainage Pericardium: Normal thickness IMPRESSION: 1. Coronary calcium score of 978. This was 83rd percentile for age and sex  matched control. 2. There is noncalcified plaque in the distal region of a dominant LCX prior to LPDA causing 50-69% stenosis. CTFFR across lesion is 0.78, suggesting lesion is functionally significant. 3. There is a subsequent calcified lesion in the LPDA causing 25-49%, with CTFFR 0.66 across lesion (though vessel is <83mm in size at this point) 4. Calcified plaque in the left main and proximal LAD causes 25-49% stenosis CAD-RADS 3. Moderate stenosis. Consider symptom-guided anti-ischemic pharmacotherapy as well as risk factor modification per guideline directed care. Electronically Signed   By: Oswaldo Milian MD   On: 09/03/2019 08:30   Result Date: 09/03/2019 EXAM: OVER-READ INTERPRETATION  CT CHEST The following report is an over-read performed by radiologist Dr. Vinnie Langton of [facility], [place] on 09/02/2019. This over-read does not include interpretation of cardiac or coronary anatomy or pathology. The coronary calcium score/coronary CTA interpretation by the cardiologist is attached. COMPARISON:  Low-dose lung cancer screening chest CT 07/03/2019. FINDINGS: Aortic atherosclerosis. A few tiny pulmonary nodules are noted in the right lung, largest of which is in the right middle lobe (axial image 6 of series 11) measuring 6 x 4 mm (mean diameter of 5 mm). Within the visualized portions of the thorax there are no other larger more suspicious appearing pulmonary nodules or masses, there is no acute consolidative airspace disease, no pleural effusions, no pneumothorax and no lymphadenopathy. Visualized portions of the upper abdomen are unremarkable. There are no aggressive appearing lytic or blastic lesions noted in the visualized portions of the skeleton. IMPRESSION: 1. Several small pulmonary nodules in the right lung measuring 5 mm or less in size, nonspecific but statistically benign, and similar compared to prior chest CTs. Continued attention on routine annual low-dose lung cancer  screening examination is recommended. 2. Aortic Atherosclerosis (ICD10-I70.0). Electronically Signed: By: Vinnie Langton M.D. On: 09/02/2019 11:31   VAS US CAROTID  Result Date: 08/19/2019 Carotid Arterial Duplex Study Indications:  Bilateral bruits and Patient denies any cerebrovascular symptoms. Risk Factors: Hypertension, hyperlipidemia, past history of smoking. Limitations   Today's exam was limited due to the patient's respiratory               variation. Performing Technologist: Wilkie Aye RVT  Examination Guidelines: A complete evaluation includes B-mode imaging, spectral Doppler, color Doppler, and power Doppler as needed of all accessible portions of each vessel. Bilateral testing is considered an integral part of a complete examination. Limited examinations for reoccurring indications may be performed as noted.  Right Carotid Findings: +----------+--------+--------+--------+------------------+--------+           PSV cm/sEDV cm/sStenosisPlaque DescriptionComments +----------+--------+--------+--------+------------------+--------+ CCA Prox  103     18                                         +----------+--------+--------+--------+------------------+--------+ CCA Distal71      13                                         +----------+--------+--------+--------+------------------+--------+  ICA Prox  56      14      1-39%   heterogenous               +----------+--------+--------+--------+------------------+--------+ ICA Mid   68      21                                         +----------+--------+--------+--------+------------------+--------+ ICA Distal84      35                                         +----------+--------+--------+--------+------------------+--------+ ECA       141     21                                         +----------+--------+--------+--------+------------------+--------+  +----------+--------+-------+----------------+-------------------+           PSV cm/sEDV cmsDescribe        Arm Pressure (mmHG) +----------+--------+-------+----------------+-------------------+ [id number]             Multiphasic, [id number]                 +----------+--------+-------+----------------+-------------------+ +---------+--------+--+--------+-+---------+ VertebralPSV cm/s44EDV cm/s8Antegrade +---------+--------+--+--------+-+---------+  Left Carotid Findings: +----------+--------+--------+--------+------------------+--------+           PSV cm/sEDV cm/sStenosisPlaque DescriptionComments +----------+--------+--------+--------+------------------+--------+ CCA Prox  133     22                                         +----------+--------+--------+--------+------------------+--------+ CCA Distal91      11                                         +----------+--------+--------+--------+------------------+--------+ ICA Prox  63      17      1-39%   heterogenous               +----------+--------+--------+--------+------------------+--------+ ICA Mid   83      21                                         +----------+--------+--------+--------+------------------+--------+ ICA Distal77      17                                         +----------+--------+--------+--------+------------------+--------+ ECA       157     13                                         +----------+--------+--------+--------+------------------+--------+ +----------+--------+--------+----------------+-------------------+           PSV cm/sEDV cm/sDescribe        Arm Pressure (mmHG) +----------+--------+--------+----------------+-------------------+ Subclavian161             Multiphasic, [id number]                 +----------+--------+--------+----------------+-------------------+ +---------+--------+--+--------+--+---------+  VertebralPSV cm/s51EDV cm/s10Antegrade  +---------+--------+--+--------+--+---------+   Summary: Right Carotid: Velocities in the right ICA are consistent with a 1-39% stenosis. Left Carotid: Velocities in the left ICA are consistent with a 1-39% stenosis. Vertebrals:  Bilateral vertebral arteries demonstrate antegrade flow. Subclavians: Normal flow hemodynamics were seen in bilateral subclavian              arteries. *See table(s) above for measurements and observations.  Electronically signed by Larae Grooms MD on 08/19/2019 at 5:10:10 PM.    Final     Lab Results:  CBC No results found for: WBC, RBC, HGB, HCT, PLT, MCV, MCH, MCHC, RDW, LYMPHSABS, MONOABS, EOSABS, BASOSABS  BMET    Component Value Date/Time   NA 133 (L) 08/28/2019 1056   K 4.6 08/28/2019 1056   CL 93 (L) 08/28/2019 1056   CO2 25 08/28/2019 1056   GLUCOSE 77 08/28/2019 1056   BUN 11 08/28/2019 1056   CREATININE 0.78 08/28/2019 1056   CALCIUM 9.6 08/28/2019 1056   GFRNONAA 91 08/28/2019 1056   GFRAA 105 08/28/2019 1056    BNP No results found for: BNP  ProBNP No results found for: PROBNP  Specialty Problems      Pulmonary Problems   COPD with chronic bronchitis and emphysema (HCC)    FEV1 43 %      Chronic respiratory failure with hypoxia (HCC)     6MWT 03/09/19 showed patient desaturated to 84% RA          No Known Allergies  Immunization History  Administered Date(s) Administered  . Influenza,inj,Quad PF,6-35 Mos 11/09/2018  . PFIZER SARS-COV-2 Vaccination 03/23/2019, 04/23/2019    Past Medical History:  Diagnosis Date  . COPD (chronic obstructive pulmonary disease) (HCC)     Tobacco History: Social History   Tobacco Use  Smoking Status Former Smoker  . Packs/day: 2.00  . Years: 40.00  . Pack years: 80.00  . Types: Cigarettes  . Quit date: 2010  . Years since quitting: 11.6  Smokeless Tobacco Never Used   Counseling given: Not Answered   Continue to not smoke  Outpatient Encounter Medications as of 09/04/2019    Medication Sig  . albuterol (PROVENTIL HFA;VENTOLIN HFA) 108 (90 Base) MCG/ACT inhaler Inhale 2 puffs into the lungs every 6 (six) hours as needed for wheezing or shortness of breath.  . ALPRAZolam (XANAX) 0.25 MG tablet Take 0.25 mg by mouth daily as needed.  Marland Kitchen amLODipine (NORVASC) 5 MG tablet Take 5 mg by mouth daily.   Marland Kitchen atorvastatin (LIPITOR) 20 MG tablet Take 20 mg by mouth daily. Pt takes two twice daily  . Budeson-Glycopyrrol-Formoterol (BREZTRI AEROSPHERE) 160-9-4.8 MCG/ACT AERO Inhale 2 puffs into the lungs in the morning and at bedtime.  . Budeson-Glycopyrrol-Formoterol (BREZTRI AEROSPHERE) 160-9-4.8 MCG/ACT AERO Inhale 2 puffs into the lungs in the morning and at bedtime.  . furosemide (LASIX) 20 MG tablet Take 20 mg by mouth daily.  Marland Kitchen ibuprofen (ADVIL,MOTRIN) 200 MG tablet Take 400 mg by mouth every 6 (six) hours as needed.  . loratadine (CLARITIN) 10 MG tablet Take 10 mg by mouth daily as needed for allergies.  . vitamin B-12 (CYANOCOBALAMIN) 1000 MCG tablet Take 1,000 mcg by mouth.  . [DISCONTINUED] metoprolol tartrate (LOPRESSOR) 100 MG tablet Take ONE tablet TWO HOURS prior to CT test.   No facility-administered encounter medications on file as of 09/04/2019.     Review of Systems  Review of Systems  Constitutional: Positive for fatigue. Negative for activity change, chills, fever and unexpected  weight change.  HENT: Negative for postnasal drip, rhinorrhea, sinus pressure, sinus pain and sore throat.   Eyes: Negative.   Respiratory: Positive for shortness of breath. Negative for cough and wheezing.   Cardiovascular: Negative for chest pain and palpitations.  Gastrointestinal: Negative for constipation, diarrhea, nausea and vomiting.  Endocrine: Negative.   Genitourinary: Negative.   Musculoskeletal: Negative.   Skin: Negative.   Neurological: Negative for dizziness and headaches.  Psychiatric/Behavioral: Negative.  Negative for dysphoric mood. The patient is not  nervous/anxious.   All other systems reviewed and are negative.    Physical Exam  BP 120/78 (BP Location: Left Arm, Cuff Size: Normal)   Pulse 93   Temp 97.6 F (36.4 C) (Oral)   Ht 5\' 11"  (1.803 m)   Wt 225 lb 12.8 oz (102.4 kg)   SpO2 97%   BMI 31.49 kg/m   Wt Readings from Last 5 Encounters:  09/04/19 225 lb 12.8 oz (102.4 kg)  08/25/19 223 lb (101.2 kg)  08/18/19 221 lb (100.2 kg)  08/12/19 222 lb 9.6 oz (101 kg)  08/11/19 223 lb (101.2 kg)    BMI Readings from Last 5 Encounters:  09/04/19 31.49 kg/m  08/25/19 31.10 kg/m  08/18/19 30.82 kg/m  08/12/19 31.05 kg/m  08/11/19 31.10 kg/m     Physical Exam Vitals and nursing note reviewed.  Constitutional:      General: He is not in acute distress.    Appearance: Normal appearance. He is normal weight.  HENT:     Head: Normocephalic and atraumatic.     Right Ear: Hearing and external ear normal. There is no impacted cerumen.     Left Ear: Hearing and external ear normal. There is no impacted cerumen.     Nose: No mucosal edema, congestion or rhinorrhea.     Right Turbinates: Not enlarged.     Left Turbinates: Not enlarged.     Mouth/Throat:     Mouth: Mucous membranes are dry.     Pharynx: Oropharynx is clear. No oropharyngeal exudate.  Eyes:     Pupils: Pupils are equal, round, and reactive to light.  Cardiovascular:     Rate and Rhythm: Normal rate and regular rhythm.     Pulses: Normal pulses.     Heart sounds: Normal heart sounds. No murmur heard.   Pulmonary:     Effort: Pulmonary effort is normal.     Breath sounds: No decreased breath sounds, wheezing or rales.  Musculoskeletal:     Cervical back: Normal range of motion.     Right lower leg: No edema.     Left lower leg: No edema.  Lymphadenopathy:     Cervical: No cervical adenopathy.  Skin:    General: Skin is warm and dry.     Capillary Refill: Capillary refill takes less than 2 seconds.     Findings: No erythema or rash.  Neurological:      General: No focal deficit present.     Mental Status: He is alert and oriented to person, place, and time.     Motor: No weakness.     Coordination: Coordination normal.     Gait: Gait is intact. Gait normal.  Psychiatric:        Mood and Affect: Mood normal.        Behavior: Behavior normal. Behavior is cooperative.        Thought Content: Thought content normal.        Judgment: Judgment normal.  Assessment & Plan:   COPD with chronic bronchitis and emphysema ([place]) Plan: Continue work on pulmonary rehab Continue Breztri  We recommend the seasonal flu vaccine Remain physically active   Chronic respiratory failure with hypoxia ([place]) Plan: Continue her current pulmonary rehab Continue oxygen therapy with physical exertion to maintain oxygen saturations above 88%   Former smoker Plan: Continue for lung cancer screening program Next CT imaging follow-up due in June/2022    Return in about 4 months (around 01/04/2020), or if symptoms worsen or fail to improve, for Follow up with Dr. [name], Follow up for FULL PFT - 60 min.   Lauraine Rinne, NP 09/04/2019   This appointment required 32 minutes of patient care (this includes precharting, chart review, review of results, face-to-face care, etc.).

## 2019-09-04 NOTE — Patient Instructions (Addendum)
You were seen today by Lauraine Rinne, NP  for:   1. COPD with chronic bronchitis and emphysema ([place])  - Pulmonary function test; Future  Breztri >>> 2 puffs in the morning right when you wake up, rinse out your mouth after use, 12 hours later 2 puffs, rinse after use >>> Take this daily, no matter what >>> This is not a rescue inhaler   Only use your albuterol as a rescue medication to be used if you can't catch your breath by resting or doing a relaxed purse lip breathing pattern.  - The less you use it, the better it will work when you need it. - Ok to use up to 2 puffs  every 4 hours if you must but call for immediate appointment if use goes up over your usual need - Don't leave home without it !!  (think of it like the spare tire for your car)   Note your daily symptoms > remember "red flags" for COPD:   >>>Increase in cough >>>increase in sputum production >>>increase in shortness of breath or activity  intolerance.   If you notice these symptoms, please call the office to be seen.   Continue working with pulmonary rehab  Obtain seasonal flu vaccine when available on fall/2021  2. Chronic respiratory failure with hypoxia (HCC)  Continue oxygen therapy as prescribed  >>>maintain oxygen saturations greater than 88 percent  >>>if unable to maintain oxygen saturations please contact the office  >>>do not smoke with oxygen  >>>can use nasal saline gel or nasal saline rinses to moisturize nose if oxygen causes dryness  3. Former smoker  Continue to not smoke  Remain in the lung cancer screening program you are due for repeat CT of your chest in June/2022   We recommend today:  Orders Placed This Encounter  Procedures  . Pulmonary function test    Standing Status:   Future    Standing Expiration Date:   09/03/2020    Order Specific Question:   Where should this test be performed?    Answer:   [facility]    Order Specific Question:   Full PFT: includes the  following: basic spirometry, spirometry pre & post bronchodilator, diffusion capacity (DLCO), lung volumes    Answer:   Full PFT   Orders Placed This Encounter  Procedures  . Pulmonary function test   No orders of the defined types were placed in this encounter.   Follow Up:    Return in about 4 months (around 01/04/2020), or if symptoms worsen or fail to improve, for Follow up with Dr. [name], Follow up for FULL PFT - 60 min.   Please do your part to reduce the spread of COVID-19:      Reduce your risk of any infection  and COVID19 by using the similar precautions used for avoiding the common cold or flu:  Marland Kitchen Wash your hands often with soap and warm water for at least 20 seconds.  If soap and water are not readily available, use an alcohol-based hand sanitizer with at least 60% alcohol.  . If coughing or sneezing, cover your mouth and nose by coughing or sneezing into the elbow areas of your shirt or coat, into a tissue or into your sleeve (not your hands). Langley Gauss A MASK when in public  . Avoid shaking hands with others and consider head nods or verbal greetings only. . Avoid touching your eyes, nose, or mouth with unwashed hands.  . Avoid close  contact with people who are sick. . Avoid places or events with large numbers of people in one location, like concerts or sporting events. . If you have some symptoms but not all symptoms, continue to monitor at home and seek medical attention if your symptoms worsen. . If you are having a medical emergency, call 911.   [place] / e-Visit: [email or web address]         [facility]: [facility]: [phone]                   [facility]: [phone]     It is flu season:   >>> Best ways to protect herself from the flu: Receive the yearly flu vaccine, practice good hand hygiene  washing with soap and also using hand sanitizer when available, eat a nutritious meals, get adequate rest, hydrate appropriately   Please contact the office if your symptoms worsen or you have concerns that you are not improving.   Thank you for choosing [facility] for your healthcare, and for allowing [place] to partner with you on your healthcare journey. I am thankful to be able to provide care to you today.   Wyn Quaker FNP-C

## 2019-09-04 NOTE — Assessment & Plan Note (Signed)
Plan: Continue for lung cancer screening program Next CT imaging follow-up due in June/2022

## 2019-09-04 NOTE — Assessment & Plan Note (Signed)
Plan: Continue her current pulmonary rehab Continue oxygen therapy with physical exertion to maintain oxygen saturations above 88%

## 2019-09-04 NOTE — Assessment & Plan Note (Signed)
Plan: Continue work on pulmonary rehab Continue Breztri  We recommend the seasonal flu vaccine Remain physically active

## 2019-09-10 NOTE — Progress Notes (Signed)
[facility] Report   Patient Details  Name: [name] [name] MRN: [id number] Date of Birth: [birth year] Age: 74 y.o. PCP: Lujean Amel, MD  Vitals:   09/10/19 1411  BP: 132/60  Pulse: 78  SpO2: 98%      [name] Eval - 09/10/19 1400      Information for Trainer   Goals 3 days of wk exercise and exercise at home also   talked about breaking the workouts and home cleaning up     Mobility and Daily Activities   I find it easy to walk up or down two or more flights of stairs. 1    I have no trouble taking out the trash. 4    I do housework such as vacuuming and dusting on my own without difficulty. 2    I can easily lift a gallon of milk (8lbs). 4    I can easily walk a mile. 1    I have no trouble reaching into high cupboards or reaching down to pick up something from the floor. 3    I do not have trouble doing out-door work such as Armed forces logistics/support/administrative officer, raking leaves, or gardening. 1      Mobility and Daily Activities   I feel younger than my age. 2    I feel independent. 3    I feel energetic. 2    I live an active life.  3    I feel strong. 2    I feel healthy. 3    I feel active as other people my age. 2      How fit and strong are you.   Fit and Strong Total Score 33          Past Medical History:  Diagnosis Date  . COPD (chronic obstructive pulmonary disease) ([place])    Past Surgical History:  Procedure Laterality Date  . NASAL SINUS SURGERY  2007   Social History   Tobacco Use  Smoking Status Former Smoker  . Packs/day: 2.00  . Years: 40.00  . Pack years: 80.00  . Types: Cigarettes  . Quit date: 2010  . Years since quitting: 11.6  Smokeless Tobacco Never Used   Noted balance improved. Improved small amounts in cardio and strength as well.   [name] 09/10/2019, 2:13 PM

## 2019-09-11 ENCOUNTER — Encounter: Payer: Self-pay | Admitting: Cardiovascular Disease

## 2019-09-11 ENCOUNTER — Other Ambulatory Visit: Payer: Self-pay

## 2019-09-11 ENCOUNTER — Ambulatory Visit: Payer: PPO | Admitting: Cardiovascular Disease

## 2019-09-11 VITALS — BP 130/68 | HR 82 | Temp 96.0°F | Ht 71.0 in | Wt 226.0 lb

## 2019-09-11 DIAGNOSIS — I10 Essential (primary) hypertension: Secondary | ICD-10-CM | POA: Diagnosis not present

## 2019-09-11 DIAGNOSIS — E782 Mixed hyperlipidemia: Secondary | ICD-10-CM

## 2019-09-11 NOTE — Progress Notes (Signed)
Mr. [name] [name] returns today for follow-up.  His coronary CTA showed a coronary calcium score of 978 with moderate CAD especially in the distal vessels but no critical disease in the more proximal vessels.  He specifically denies chest pain.  I suspect his shortness of breath is related to COPD.  I am going to get a lipid liver profile today.  His last lipid profile performed a year ago showed an LDL of 47 on statin therapy.  I am also going to get a 2D echocardiogram.  I will reserve cardiac catheterization for development of chest pain and/or progressive shortness of breath.  I will see him back in 6 months for follow-up.  Lorretta Harp, M.D., [place], [facility], [place] [address] [place], [place]  [id number]  [phone] 09/11/2019 12:14 PM

## 2019-09-11 NOTE — Patient Instructions (Signed)
Medication Instructions:  The current medical regimen is effective;  continue present plan and medications.  *If you need a refill on your cardiac medications before your next appointment, please call your pharmacy*   Lab Work: LIPID/LIVER  If you have labs (blood work) drawn today and your tests are completely normal, you will receive your results only by: Marland Kitchen MyChart Message (if you have MyChart) OR . A paper copy in the mail If you have any lab test that is abnormal or we need to change your treatment, we will call you to review the results.   Testing/Procedures: Echocardiogram - Your physician has requested that you have an echocardiogram. Echocardiography is a painless test that uses sound waves to create images of your heart. It provides your doctor with information about the size and shape of your heart and how well your heart's chambers and valves are working. This procedure takes approximately one hour. There are no restrictions for this procedure. This will be performed at our [facility] location - [address].    Follow-Up: At [facility], you and your health needs are our priority.  As part of our continuing mission to provide you with exceptional heart care, we have created designated Provider Care Teams.  These Care Teams include your primary Cardiologist (physician) and Advanced Practice Providers (APPs -  Physician Assistants and Nurse Practitioners) who all work together to provide you with the care you need, when you need it.  We recommend signing up for the patient portal called "MyChart".  Sign up information is provided on this After Visit Summary.  MyChart is used to connect with patients for Virtual Visits (Telemedicine).  Patients are able to view lab/test results, encounter notes, upcoming appointments, etc.  Non-urgent messages can be sent to your provider as well.   To learn more about what you can do with MyChart, go to [email or web address].     Your next appointment:   6 month(s)  The format for your next appointment:   In Person  Provider:   Quay Burow, MD

## 2019-09-12 LAB — LIPID PANEL
Chol/HDL Ratio: 1.6 ratio (ref 0.0–5.0)
Cholesterol, Total: 162 mg/dL (ref 100–199)
HDL: 104 mg/dL (ref >39)
LDL Chol Calc (NIH): 46 mg/dL (ref 0–99)
Triglycerides: 56 mg/dL (ref 0–149)
VLDL Cholesterol Cal: 12 mg/dL (ref 5–40)

## 2019-09-12 LAB — HEPATIC FUNCTION PANEL
ALT: 24 IU/L (ref 0–44)
AST: 25 IU/L (ref 0–40)
Albumin: 4.5 g/dL (ref 3.7–4.7)
Alkaline Phosphatase: 136 IU/L — ABNORMAL HIGH (ref 48–121)
Bilirubin Total: 1 mg/dL (ref 0.0–1.2)
Bilirubin, Direct: 0.29 mg/dL (ref 0.00–0.40)
Total Protein: 7.4 g/dL (ref 6.0–8.5)

## 2019-10-02 DIAGNOSIS — J449 Chronic obstructive pulmonary disease, unspecified: Secondary | ICD-10-CM | POA: Diagnosis not present

## 2019-10-02 DIAGNOSIS — J439 Emphysema, unspecified: Secondary | ICD-10-CM | POA: Diagnosis not present

## 2019-10-02 DIAGNOSIS — J42 Unspecified chronic bronchitis: Secondary | ICD-10-CM | POA: Diagnosis not present

## 2019-10-02 DIAGNOSIS — J9611 Chronic respiratory failure with hypoxia: Secondary | ICD-10-CM | POA: Diagnosis not present

## 2019-10-05 ENCOUNTER — Other Ambulatory Visit: Payer: Self-pay

## 2019-10-05 ENCOUNTER — Ambulatory Visit (HOSPITAL_COMMUNITY): Payer: PPO | Attending: Internal Medicine

## 2019-10-05 DIAGNOSIS — E782 Mixed hyperlipidemia: Secondary | ICD-10-CM | POA: Diagnosis not present

## 2019-10-05 DIAGNOSIS — I10 Essential (primary) hypertension: Secondary | ICD-10-CM | POA: Diagnosis not present

## 2019-10-05 LAB — ECHOCARDIOGRAM COMPLETE
Area-P 1/2: 3.53 cm2
S' Lateral: 2.9 cm

## 2019-11-01 DIAGNOSIS — J449 Chronic obstructive pulmonary disease, unspecified: Secondary | ICD-10-CM | POA: Diagnosis not present

## 2019-11-01 DIAGNOSIS — J9611 Chronic respiratory failure with hypoxia: Secondary | ICD-10-CM | POA: Diagnosis not present

## 2019-11-01 DIAGNOSIS — J439 Emphysema, unspecified: Secondary | ICD-10-CM | POA: Diagnosis not present

## 2019-11-01 DIAGNOSIS — J42 Unspecified chronic bronchitis: Secondary | ICD-10-CM | POA: Diagnosis not present

## 2019-11-26 DIAGNOSIS — R0602 Shortness of breath: Secondary | ICD-10-CM | POA: Diagnosis not present

## 2019-11-26 DIAGNOSIS — I700 Atherosclerosis of aorta: Secondary | ICD-10-CM | POA: Diagnosis not present

## 2019-11-26 DIAGNOSIS — I2510 Atherosclerotic heart disease of native coronary artery without angina pectoris: Secondary | ICD-10-CM | POA: Diagnosis not present

## 2019-11-26 DIAGNOSIS — J449 Chronic obstructive pulmonary disease, unspecified: Secondary | ICD-10-CM | POA: Diagnosis not present

## 2019-11-26 DIAGNOSIS — F419 Anxiety disorder, unspecified: Secondary | ICD-10-CM | POA: Diagnosis not present

## 2019-11-26 DIAGNOSIS — Z79899 Other long term (current) drug therapy: Secondary | ICD-10-CM | POA: Diagnosis not present

## 2019-11-26 DIAGNOSIS — Z23 Encounter for immunization: Secondary | ICD-10-CM | POA: Diagnosis not present

## 2019-11-26 DIAGNOSIS — E7800 Pure hypercholesterolemia, unspecified: Secondary | ICD-10-CM | POA: Diagnosis not present

## 2019-11-26 DIAGNOSIS — I10 Essential (primary) hypertension: Secondary | ICD-10-CM | POA: Diagnosis not present

## 2019-11-26 DIAGNOSIS — Z0001 Encounter for general adult medical examination with abnormal findings: Secondary | ICD-10-CM | POA: Diagnosis not present

## 2019-12-02 DIAGNOSIS — J439 Emphysema, unspecified: Secondary | ICD-10-CM | POA: Diagnosis not present

## 2019-12-02 DIAGNOSIS — J9611 Chronic respiratory failure with hypoxia: Secondary | ICD-10-CM | POA: Diagnosis not present

## 2019-12-02 DIAGNOSIS — J42 Unspecified chronic bronchitis: Secondary | ICD-10-CM | POA: Diagnosis not present

## 2019-12-02 DIAGNOSIS — J449 Chronic obstructive pulmonary disease, unspecified: Secondary | ICD-10-CM | POA: Diagnosis not present

## 2019-12-07 ENCOUNTER — Telehealth: Payer: Self-pay | Admitting: Pulmonary Disease

## 2019-12-07 NOTE — Telephone Encounter (Signed)
Called and spoke with pt who stated he wanted to have an appt scheduled with Dr. [name] even prior to PFT being scheduled as he was not completely satisfied with how his last OV went. I have scheduled pt for an appt with Dr. [name] 12/16. Nothing further needed.

## 2019-12-24 ENCOUNTER — Ambulatory Visit: Payer: PPO | Admitting: Pulmonary Disease

## 2019-12-24 ENCOUNTER — Other Ambulatory Visit: Payer: Self-pay

## 2019-12-24 ENCOUNTER — Encounter: Payer: Self-pay | Admitting: Pulmonary Disease

## 2019-12-24 DIAGNOSIS — J449 Chronic obstructive pulmonary disease, unspecified: Secondary | ICD-10-CM

## 2019-12-24 DIAGNOSIS — J9611 Chronic respiratory failure with hypoxia: Secondary | ICD-10-CM | POA: Diagnosis not present

## 2019-12-24 MED ORDER — ALBUTEROL SULFATE HFA 108 (90 BASE) MCG/ACT IN AERS: 2.0000 | INHALATION_SPRAY | Freq: Four times a day (QID) | RESPIRATORY_TRACT | 2 refills | Status: DC | PRN

## 2019-12-24 NOTE — Progress Notes (Signed)
   Subjective:    Patient ID: [name] [name], male    DOB: October 10, 1945, 74 y.o.   MRN: [id number]  HPI   74 yo heavy ex-smoker  For FU of  COPD. He smoked about a pack per day starting as a teenager until he quit in 2010- about 40 pack years.  Last seen by me 02/2018 , he enrolled in pulmonary rehab on a 6-minute walk test was found to desaturate to 84%, started on portable oxygen 03/2019 and has done well with this.  He developed increased dyspnea for the last few months, weight increased as high as 238 pounds was started on Lasix, weight is now down to 231 pounds he is starting to feel better over the last 2 to 4 weeks.  He has also decreased alcohol intake, LFTs reviewed which were normal.  He also had cardiac work-up including CT coronaries and echocardiogram which were normal LV function no evidence of diastolic dysfunction  He has switch from Anoro to [place], took Trelegy in between but likes Breztri better He is compliant with oxygen, completed pulmonary rehab and has questions about activity  I reviewed low-dose CT chest  Significant tests/ events reviewed  LDCT 06/2019--multiple small pulmonary nodules are noted, mild to moderate centrilobular and paraseptal emphysema, lung RADS 2S LD CT chest 06/2018  RADS 2, benign right-sided pulmonary nodules, stable compared to prior CTs.  Moderate emphysema, aortic atherosclerosis.  PFTs 09/2012 moderate reversible airway obstruction, ratio 49, FEV1 44%, improved to 56%/25% bronchodilator response, TLC 88%, DLCO 55%  Spirometry 03/18/2018 showed ratio of 61, FEV1 of 43% and FVC of 52%  Review of Systems  Patient denies significant dyspnea,cough, hemoptysis,  chest pain, palpitations, pedal edema, orthopnea, paroxysmal nocturnal dyspnea, lightheadedness, nausea, vomiting, abdominal or  leg pains      Objective:   Physical Exam  Gen. Pleasant, obese, in no distress ENT - no lesions, no post nasal drip Neck: No JVD, no thyromegaly,  no carotid bruits Lungs: no use of accessory muscles, no dullness to percussion, decreased without rales or rhonchi  Cardiovascular: Rhythm regular, heart sounds  normal, no murmurs or gallops, 1+ peripheral edema Musculoskeletal: No deformities, no cyanosis or clubbing , no tremors         Assessment & Plan:

## 2019-12-24 NOTE — Patient Instructions (Signed)
  Ambulatory saturation. Stay on Breztri. Refills on albuterol MDI.  Get back to the gym.  Weigh yourself Monday/Wednesday/Friday. If weight increases by 3 pounds, okay to take an extra dose of Lasix

## 2019-12-24 NOTE — Assessment & Plan Note (Addendum)
Oxygen saturation seems to have improved, will continue to use oxygen during exertion and sleep. On ambulation today, saturation dropped from 98 to 90% after 3 laps   I feel that his worsening dyspnea over the past few months was related to fluid issues rather than COPD flare has improved with Lasix, feel it is important for him to monitor his weight and take an extra dose of Lasix if weight increases 3 to 5 pounds from baseline 230 pounds

## 2019-12-24 NOTE — Assessment & Plan Note (Signed)
Ambulatory saturation. Stay on Breztri. Refills on albuterol MDI. Completed pulmonary rehab and he can get back to the gym on his own

## 2019-12-25 DIAGNOSIS — J449 Chronic obstructive pulmonary disease, unspecified: Secondary | ICD-10-CM | POA: Diagnosis not present

## 2019-12-25 DIAGNOSIS — R609 Edema, unspecified: Secondary | ICD-10-CM | POA: Diagnosis not present

## 2019-12-25 DIAGNOSIS — I2510 Atherosclerotic heart disease of native coronary artery without angina pectoris: Secondary | ICD-10-CM | POA: Diagnosis not present

## 2019-12-25 DIAGNOSIS — R7401 Elevation of levels of liver transaminase levels: Secondary | ICD-10-CM | POA: Diagnosis not present

## 2020-01-01 DIAGNOSIS — J439 Emphysema, unspecified: Secondary | ICD-10-CM | POA: Diagnosis not present

## 2020-01-01 DIAGNOSIS — J42 Unspecified chronic bronchitis: Secondary | ICD-10-CM | POA: Diagnosis not present

## 2020-01-01 DIAGNOSIS — J9611 Chronic respiratory failure with hypoxia: Secondary | ICD-10-CM | POA: Diagnosis not present

## 2020-01-01 DIAGNOSIS — J449 Chronic obstructive pulmonary disease, unspecified: Secondary | ICD-10-CM | POA: Diagnosis not present

## 2020-01-20 DIAGNOSIS — L570 Actinic keratosis: Secondary | ICD-10-CM | POA: Diagnosis not present

## 2020-01-20 DIAGNOSIS — Z86018 Personal history of other benign neoplasm: Secondary | ICD-10-CM | POA: Diagnosis not present

## 2020-01-20 DIAGNOSIS — D485 Neoplasm of uncertain behavior of skin: Secondary | ICD-10-CM | POA: Diagnosis not present

## 2020-01-20 DIAGNOSIS — L814 Other melanin hyperpigmentation: Secondary | ICD-10-CM | POA: Diagnosis not present

## 2020-01-20 DIAGNOSIS — Z85828 Personal history of other malignant neoplasm of skin: Secondary | ICD-10-CM | POA: Diagnosis not present

## 2020-01-20 DIAGNOSIS — D225 Melanocytic nevi of trunk: Secondary | ICD-10-CM | POA: Diagnosis not present

## 2020-01-20 DIAGNOSIS — L578 Other skin changes due to chronic exposure to nonionizing radiation: Secondary | ICD-10-CM | POA: Diagnosis not present

## 2020-01-20 DIAGNOSIS — L821 Other seborrheic keratosis: Secondary | ICD-10-CM | POA: Diagnosis not present

## 2020-02-01 DIAGNOSIS — J449 Chronic obstructive pulmonary disease, unspecified: Secondary | ICD-10-CM | POA: Diagnosis not present

## 2020-02-01 DIAGNOSIS — J439 Emphysema, unspecified: Secondary | ICD-10-CM | POA: Diagnosis not present

## 2020-02-01 DIAGNOSIS — J9611 Chronic respiratory failure with hypoxia: Secondary | ICD-10-CM | POA: Diagnosis not present

## 2020-02-01 DIAGNOSIS — J42 Unspecified chronic bronchitis: Secondary | ICD-10-CM | POA: Diagnosis not present

## 2020-02-04 DIAGNOSIS — L570 Actinic keratosis: Secondary | ICD-10-CM | POA: Diagnosis not present

## 2020-02-17 DIAGNOSIS — F419 Anxiety disorder, unspecified: Secondary | ICD-10-CM | POA: Diagnosis not present

## 2020-02-17 DIAGNOSIS — J449 Chronic obstructive pulmonary disease, unspecified: Secondary | ICD-10-CM | POA: Diagnosis not present

## 2020-02-17 DIAGNOSIS — I700 Atherosclerosis of aorta: Secondary | ICD-10-CM | POA: Diagnosis not present

## 2020-02-17 DIAGNOSIS — I2510 Atherosclerotic heart disease of native coronary artery without angina pectoris: Secondary | ICD-10-CM | POA: Diagnosis not present

## 2020-02-17 DIAGNOSIS — I10 Essential (primary) hypertension: Secondary | ICD-10-CM | POA: Diagnosis not present

## 2020-02-17 DIAGNOSIS — R7401 Elevation of levels of liver transaminase levels: Secondary | ICD-10-CM | POA: Diagnosis not present

## 2020-03-03 DIAGNOSIS — J449 Chronic obstructive pulmonary disease, unspecified: Secondary | ICD-10-CM | POA: Diagnosis not present

## 2020-03-03 DIAGNOSIS — J9611 Chronic respiratory failure with hypoxia: Secondary | ICD-10-CM | POA: Diagnosis not present

## 2020-03-03 DIAGNOSIS — J439 Emphysema, unspecified: Secondary | ICD-10-CM | POA: Diagnosis not present

## 2020-03-03 DIAGNOSIS — J42 Unspecified chronic bronchitis: Secondary | ICD-10-CM | POA: Diagnosis not present

## 2020-03-16 ENCOUNTER — Ambulatory Visit: Payer: PPO | Admitting: Cardiovascular Disease

## 2020-03-31 DIAGNOSIS — J9611 Chronic respiratory failure with hypoxia: Secondary | ICD-10-CM | POA: Diagnosis not present

## 2020-03-31 DIAGNOSIS — J439 Emphysema, unspecified: Secondary | ICD-10-CM | POA: Diagnosis not present

## 2020-03-31 DIAGNOSIS — J449 Chronic obstructive pulmonary disease, unspecified: Secondary | ICD-10-CM | POA: Diagnosis not present

## 2020-03-31 DIAGNOSIS — J42 Unspecified chronic bronchitis: Secondary | ICD-10-CM | POA: Diagnosis not present

## 2020-05-01 DIAGNOSIS — J439 Emphysema, unspecified: Secondary | ICD-10-CM | POA: Diagnosis not present

## 2020-05-01 DIAGNOSIS — J449 Chronic obstructive pulmonary disease, unspecified: Secondary | ICD-10-CM | POA: Diagnosis not present

## 2020-05-01 DIAGNOSIS — J9611 Chronic respiratory failure with hypoxia: Secondary | ICD-10-CM | POA: Diagnosis not present

## 2020-05-01 DIAGNOSIS — J42 Unspecified chronic bronchitis: Secondary | ICD-10-CM | POA: Diagnosis not present

## 2020-05-31 DIAGNOSIS — J9611 Chronic respiratory failure with hypoxia: Secondary | ICD-10-CM | POA: Diagnosis not present

## 2020-05-31 DIAGNOSIS — J439 Emphysema, unspecified: Secondary | ICD-10-CM | POA: Diagnosis not present

## 2020-05-31 DIAGNOSIS — J449 Chronic obstructive pulmonary disease, unspecified: Secondary | ICD-10-CM | POA: Diagnosis not present

## 2020-05-31 DIAGNOSIS — J42 Unspecified chronic bronchitis: Secondary | ICD-10-CM | POA: Diagnosis not present

## 2020-06-24 ENCOUNTER — Ambulatory Visit: Payer: PPO | Admitting: Primary Care

## 2020-06-24 ENCOUNTER — Ambulatory Visit (INDEPENDENT_AMBULATORY_CARE_PROVIDER_SITE_OTHER): Payer: PPO

## 2020-06-24 ENCOUNTER — Encounter (HOSPITAL_COMMUNITY): Payer: Self-pay | Admitting: *Deleted

## 2020-06-24 ENCOUNTER — Other Ambulatory Visit: Payer: Self-pay

## 2020-06-24 ENCOUNTER — Telehealth: Payer: Self-pay | Admitting: Primary Care

## 2020-06-24 ENCOUNTER — Encounter: Payer: Self-pay | Admitting: Primary Care

## 2020-06-24 VITALS — BP 132/70 | HR 100 | Temp 98.8°F | Ht 71.0 in | Wt 234.8 lb

## 2020-06-24 DIAGNOSIS — M7989 Other specified soft tissue disorders: Secondary | ICD-10-CM

## 2020-06-24 DIAGNOSIS — R0602 Shortness of breath: Secondary | ICD-10-CM

## 2020-06-24 DIAGNOSIS — J449 Chronic obstructive pulmonary disease, unspecified: Secondary | ICD-10-CM

## 2020-06-24 DIAGNOSIS — Z87891 Personal history of nicotine dependence: Secondary | ICD-10-CM | POA: Diagnosis not present

## 2020-06-24 DIAGNOSIS — J9611 Chronic respiratory failure with hypoxia: Secondary | ICD-10-CM

## 2020-06-24 DIAGNOSIS — E871 Hypo-osmolality and hyponatremia: Secondary | ICD-10-CM

## 2020-06-24 DIAGNOSIS — R0902 Hypoxemia: Secondary | ICD-10-CM | POA: Diagnosis not present

## 2020-06-24 LAB — BRAIN NATRIURETIC PEPTIDE: Pro B Natriuretic peptide (BNP): 23 pg/mL (ref 0.0–100.0)

## 2020-06-24 LAB — BASIC METABOLIC PANEL
BUN: 6 mg/dL (ref 6–23)
CO2: 28 mEq/L (ref 19–32)
Calcium: 9.6 mg/dL (ref 8.4–10.5)
Chloride: 89 mEq/L — ABNORMAL LOW (ref 96–112)
Creatinine, Ser: 0.64 mg/dL (ref 0.40–1.50)
GFR: 94.44 mL/min (ref >60.00)
Glucose, Bld: 108 mg/dL — ABNORMAL HIGH (ref 70–99)
Potassium: 4.2 mEq/L (ref 3.5–5.1)
Sodium: 127 mEq/L — ABNORMAL LOW (ref 135–145)

## 2020-06-24 MED ORDER — ALPRAZOLAM 0.25 MG PO TABS
0.2500 mg | ORAL_TABLET | Freq: Every day | ORAL | 0 refills | Status: DC | PRN
Start: 2020-06-24 — End: 2022-07-09

## 2020-06-24 MED ORDER — PREDNISONE 10 MG PO TABS: ORAL_TABLET | ORAL | 0 refills | Status: DC

## 2020-06-24 MED ORDER — BREZTRI AEROSPHERE 160-9-4.8 MCG/ACT IN AERO
2.0000 | INHALATION_SPRAY | Freq: Two times a day (BID) | RESPIRATORY_TRACT | 11 refills | Status: DC
Start: 2020-06-24 — End: 2021-04-05

## 2020-06-24 NOTE — Progress Notes (Signed)
Received referral from Dr. [name] for this pt to participate in pulmonary rehab with the the diagnosis of Chronic Respiratory Failure with Hypoxia  Clinical review of pt follow up appt on 06/24/20  Pulmonary office note.  Pt completed in pulmonary rehab in 2021 and completed 21 visits. Pt with Covid Risk Score - 7. Pt appropriate for scheduling for Pulmonary rehab.  Will forward to support staff for scheduling  when able as there is a waiting list and verification of insurance eligibility/benefits with pt consent. Cherre Huger, BSN Cardiac and Training and development officer

## 2020-06-24 NOTE — Progress Notes (Addendum)
@Patient  ID: [name] [name], male    DOB: 11-Nov-1945, 75 y.o.   MRN: [id number]  Chief Complaint  Patient presents with   Follow-up    Increased SOB for past 4 months    Referring provider: Lujean Amel, MD  HPI: 75 year old male, former smoker. 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 Patient of Dr. [name]. Following with lung cancer screening program. LDCT June 2020 showed lung-RADS 2. 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 Maintained on Breztri.   Previous [facility] pulmonary encounter:  03/30/2019 Patient presents today for follow-up visit. He is here to talk about starting oxygen. He had a 6 minute walk test at pulmonary rehab on March 1st and he desaturated to 84%. He now wears oxygen while exercising at pulmonary rehab. He has a pulse oximeter at home and states that his oxygen level will drop into the mid-80s on exertion but he recovers quickly with rest. He is compliant with Anoro once daily, occasionally uses his albuterol rescue inhaler before an activity. He has had some anxiety with hyperventilation. He spoke with PCP and he was given prescription for 10 tabs of xanax which lasted him 2-3 more months. Compliant with 20mg  lasix daily, leg swelling is at baseline. His weight typically is between 228-230 at home. He has received both pfizer covid vaccines. [facility] is in network, he is interested in [place]. He denies acute symptoms; no cough, chest tightness of wheezing.                        4/5/2021Thomasenia [name] Patient contacted today for 2 week follow-up. Started on Trelegy during last visit, given two week sample. States that his breathing is some better and not worse. He has ups and downs. He would like to continue with Trelegy, prescription is cheaper than Anoro for 3 months supply. No issues with oxygen. Continues 2L on exertion. Attending pulmonary rehab. No current URI symptoms, wheezing, cough.   12/24/19- Dr. [name]  Last seen by me 02/2018 , he enrolled in pulmonary  rehab on a 6-minute walk test was found to desaturate to 84%, started on portable oxygen 03/2019 and has done well with this.  He developed increased dyspnea for the last few months, weight increased as high as 238 pounds was started on Lasix, weight is now down to 231 pounds he is starting to feel better over the last 2 to 4 weeks.  He has also decreased alcohol intake, LFTs reviewed which were normal.  He also had cardiac work-up including CT coronaries and echocardiogram which were normal LV function no evidence of diastolic dysfunction  He has switch from Anoro to [place], took Trelegy in between but likes Breztri better He is compliant with oxygen, completed pulmonary rehab and has questions about activity  I reviewed low-dose CT chest  06/24/2020 Patient presents today for 6 month follow-up. He is currently on [employer], he needs refill/script sent to [facility] and me. He has been approved for patient assistance through January 2023.  Oxygen level reported low in the morning. He was sick in nov/Dec. He has not been able to go back to the gym. He has no active cough, wheezing or chest tightness. In the morning with exertion his oxygen level will drop on RA. Associated anxiety. He is better in the afternoon. He is not wearing oxygen during the day in his house. He is also not wearing oxygen at night. He will uses POC if going  to the grocery store or when exercising. During ambulatory walk test today he needed 5L pulsed O2. He takes xanax as needed which helps with his breathing, his PCP typically prescribed this but he will not be seeing them for about a month. PMP reviewed without discrepancies.   Allergies  Allergen Reactions   Fluticasone-Salmeterol     Other reaction(s): worsening shortness of breath    Immunization History  Administered Date(s) Administered   Influenza,inj,Quad PF,6+ Mos 09/30/2015, 10/05/2016, 10/23/2017, 11/05/2018, 11/26/2019   Influenza,inj,Quad PF,6-35 Mos  11/09/2018   PFIZER(Purple Top)SARS-COV-2 Vaccination 03/23/2019, 04/23/2019   Pneumococcal Conjugate-13 09/18/2013   Pneumococcal Polysaccharide-23 09/27/2014   Tdap 10/24/2010   Zoster, Live 10/24/2010    Past Medical History:  Diagnosis Date   COPD (chronic obstructive pulmonary disease) ([place])     Tobacco History: Social History   Tobacco Use  Smoking Status Former   Packs/day: 2.00   Years: 40.00   Pack years: 80.00   Types: Cigarettes   Quit date: 2010   Years since quitting: 12.4  Smokeless Tobacco Never   Counseling given: Not Answered   Outpatient Medications Prior to Visit  Medication Sig Dispense Refill   albuterol (VENTOLIN HFA) 108 (90 Base) MCG/ACT inhaler Inhale 2 puffs into the lungs every 6 (six) hours as needed for wheezing or shortness of breath. 1 each 2   ALPRAZolam (XANAX) 0.25 MG tablet Take 0.25 mg by mouth daily as needed.     amLODipine (NORVASC) 5 MG tablet Take 5 mg by mouth daily.     atorvastatin (LIPITOR) 40 MG tablet Take 40 mg by mouth daily. Pt takes two twice daily     furosemide (LASIX) 20 MG tablet Take 20 mg by mouth daily.     ibuprofen (ADVIL,MOTRIN) 200 MG tablet Take 400 mg by mouth every 6 (six) hours as needed.     loratadine (CLARITIN) 10 MG tablet Take 10 mg by mouth daily as needed for allergies.     vitamin B-12 (CYANOCOBALAMIN) 1000 MCG tablet Take 1,000 mcg by mouth.     Budeson-Glycopyrrol-Formoterol (BREZTRI AEROSPHERE) 160-9-4.8 MCG/ACT AERO Inhale 2 puffs into the lungs in the morning and at bedtime. 10.7 g 0   No facility-administered medications prior to visit.    Review of Systems  Review of Systems  Constitutional: Negative.   HENT: Negative.    Respiratory:  Positive for shortness of breath and wheezing. Negative for cough and chest tightness.   Cardiovascular:  Positive for leg swelling.    Physical Exam  BP 132/70 (BP Location: Right Arm, Cuff Size: Normal)   Pulse 100   Temp 98.8 F (37.1 C)  (Temporal)   Ht 5\' 11"  (1.803 m)   Wt 234 lb 12.8 oz (106.5 kg)   SpO2 91% Comment: RA  BMI 32.75 kg/m  Physical Exam Constitutional:      Appearance: Normal appearance.  HENT:     Head: Normocephalic and atraumatic.  Cardiovascular:     Rate and Rhythm: Normal rate and regular rhythm.  Pulmonary:     Effort: Pulmonary effort is normal.     Breath sounds: Wheezing present.  Skin:    General: Skin is warm and dry.  Neurological:     General: No focal deficit present.     Mental Status: He is alert and oriented to person, place, and time. Mental status is at baseline.  Psychiatric:        Mood and Affect: Mood normal.  Behavior: Behavior normal.        Thought Content: Thought content normal.        Judgment: Judgment normal.     Lab Results:  CBC No results found for: WBC, RBC, HGB, HCT, PLT, MCV, MCH, MCHC, RDW, LYMPHSABS, MONOABS, EOSABS, BASOSABS  BMET    Component Value Date/Time   NA 133 (L) 08/28/2019 1056   K 4.6 08/28/2019 1056   CL 93 (L) 08/28/2019 1056   CO2 25 08/28/2019 1056   GLUCOSE 77 08/28/2019 1056   BUN 11 08/28/2019 1056   CREATININE 0.78 08/28/2019 1056   CALCIUM 9.6 08/28/2019 1056   GFRNONAA 91 08/28/2019 1056   GFRAA 105 08/28/2019 1056    BNP No results found for: BNP  ProBNP No results found for: PROBNP  Imaging: DG Chest 2 View  Result Date: 06/24/2020 CLINICAL DATA:  Short of breath with hypoxia EXAM: CHEST - 2 VIEW COMPARISON:  01/10/2012 FINDINGS: Heart size and vascularity normal. Prominent lung markings are present throughout the lungs right greater than left. This appears to be due to chronic lung disease based on prior chest CT 07/03/2019. No focal infiltrate or effusion. No mass or adenopathy. IMPRESSION: Prominent lung markings right greater than left with the appearance of chronic lung disease. No superimposed acute abnormality. Electronically Signed   By: Franchot Gallo M.D.   On: 06/24/2020 11:51     Assessment &  Plan:   COPD with chronic bronchitis and emphysema ([place]) - Patient has been experienced shortness of breath x 4 weeks and reports low oxygen levels in the moring on RA with exertion. Denies cough. He has scattered wheezing on exam, +2-3BLE edema.  - Continue to use Breztri Aeroshere two puffs morning and evening (rinse mouth after use) - Checking CXR and BNP/BMET  - Sending in RX Prednisone taper for wheezing  - FU 2 week televisit with Beth NP to review ONO results and 6 months with Dr. [name]  Chronic respiratory failure with hypoxia [facility]) - Patient is not consistently wearing oxygen at home or at night, he uses POC when grocery shopping or exercising. - On ambulatory walk test today, he needed to use 4-5L pulsed oxygen with exertion - Checking ONO on 2L at night  - Referring back to pulmonary rehab as it helped in the past   Swelling of lower extremity - Patient takes lasix 20-40mg  daily for leg swelling   Former smoker - Due for LDCT, last completed June 2021    Martyn Ehrich, NP 06/24/2020

## 2020-06-24 NOTE — Telephone Encounter (Signed)
Called and spoke with patient. He stated that he did get a text from [facility] stating [name] [name] had sent in the Xanax RX for him.   Nothing further needed at time of call.

## 2020-06-24 NOTE — Assessment & Plan Note (Signed)
-   Due for LDCT, last completed June 2021

## 2020-06-24 NOTE — Addendum Note (Signed)
Addended by: Martyn Ehrich on: 06/24/2020 02:15 PM   Modules accepted: Orders

## 2020-06-24 NOTE — Patient Instructions (Addendum)
  Recommendations: - Continue to use Breztri two puffs morning and evening (rinse mouth after use) - Continue lasix 20-40mg  daily for leg swelling  - Ensure you wear your oxygen with exertion at all times (you need 4-5L with portable concentrator)  - You also need to wear 2L oxygen at night for now  Rx: - Prednisone taper as directed for wheezing    Orders: - CXR and Labs today (ordered)  - Overnight oximetry test on 2L   Referral: - Pulmonary rehab re: COPD GOLD C (ordered)  Follow-up: - 2 week follow-up televisit with Beth NP  - 6 month follow-up with Dr. [name]

## 2020-06-24 NOTE — Assessment & Plan Note (Addendum)
-   Patient has been experienced shortness of breath x 4 weeks and reports low oxygen levels in the moring on RA with exertion. Denies cough. He has scattered wheezing on exam, +2-3BLE edema.  - Continue to use Breztri Aeroshere two puffs morning and evening (rinse mouth after use) - Checking CXR and BNP/BMET  - Sending in RX Prednisone taper for wheezing  - FU 2 week televisit with Beth NP to review ONO results and 6 months with Dr. [name]

## 2020-06-24 NOTE — Progress Notes (Signed)
Raquel Sarna please let patient know CXR showed no acute cardiopulmonary process. He has some chronic lung markings t/o, he should've had LDCT. Langley Gauss can we follow-up on this?

## 2020-06-24 NOTE — Assessment & Plan Note (Signed)
-   Patient takes lasix 20-40mg  daily for leg swelling

## 2020-06-24 NOTE — Addendum Note (Signed)
Addended by: Martyn Ehrich on: 06/24/2020 05:16 PM   Modules accepted: Orders

## 2020-06-24 NOTE — Assessment & Plan Note (Addendum)
-   Patient is not consistently wearing oxygen at home or at night, he uses POC when grocery shopping or exercising. - On ambulatory walk test today, he needed to use 4-5L pulsed oxygen with exertion - Checking ONO on 2L at night  - Referring back to pulmonary rehab as it helped in the past

## 2020-07-01 ENCOUNTER — Other Ambulatory Visit (INDEPENDENT_AMBULATORY_CARE_PROVIDER_SITE_OTHER): Payer: PPO

## 2020-07-01 DIAGNOSIS — E871 Hypo-osmolality and hyponatremia: Secondary | ICD-10-CM | POA: Diagnosis not present

## 2020-07-01 DIAGNOSIS — J439 Emphysema, unspecified: Secondary | ICD-10-CM | POA: Diagnosis not present

## 2020-07-01 DIAGNOSIS — J449 Chronic obstructive pulmonary disease, unspecified: Secondary | ICD-10-CM | POA: Diagnosis not present

## 2020-07-01 DIAGNOSIS — J42 Unspecified chronic bronchitis: Secondary | ICD-10-CM | POA: Diagnosis not present

## 2020-07-01 DIAGNOSIS — J9611 Chronic respiratory failure with hypoxia: Secondary | ICD-10-CM | POA: Diagnosis not present

## 2020-07-01 LAB — BASIC METABOLIC PANEL
BUN: 7 mg/dL (ref 6–23)
CO2: 27 mEq/L (ref 19–32)
Calcium: 9.5 mg/dL (ref 8.4–10.5)
Chloride: 93 mEq/L — ABNORMAL LOW (ref 96–112)
Creatinine, Ser: 0.62 mg/dL (ref 0.40–1.50)
GFR: 95.33 mL/min (ref >60.00)
Glucose, Bld: 110 mg/dL — ABNORMAL HIGH (ref 70–99)
Potassium: 4.2 mEq/L (ref 3.5–5.1)
Sodium: 130 mEq/L — ABNORMAL LOW (ref 135–145)

## 2020-07-01 NOTE — Progress Notes (Signed)
Please let patient know that his sodium level has improved but still is a little low. He needs to follow-up with PCP regarding chronic hyponatremia

## 2020-07-04 ENCOUNTER — Ambulatory Visit (HOSPITAL_BASED_OUTPATIENT_CLINIC_OR_DEPARTMENT_OTHER)
Admission: RE | Admit: 2020-07-04 | Discharge: 2020-07-04 | Disposition: A | Discharge status: Home / Self Care | Payer: PPO | Source: Ambulatory Visit | Attending: Acute Care | Admitting: Acute Care

## 2020-07-04 ENCOUNTER — Telehealth: Payer: Self-pay | Admitting: Primary Care

## 2020-07-04 ENCOUNTER — Other Ambulatory Visit: Payer: Self-pay

## 2020-07-04 ENCOUNTER — Ambulatory Visit (HOSPITAL_BASED_OUTPATIENT_CLINIC_OR_DEPARTMENT_OTHER): Payer: PPO

## 2020-07-04 DIAGNOSIS — Z87891 Personal history of nicotine dependence: Secondary | ICD-10-CM | POA: Diagnosis not present

## 2020-07-04 NOTE — Telephone Encounter (Signed)
Called and spoke with patient, he wanted to know if he was to do the ONO on oxygen or not.  I looked at his last visit in the office with Geraldo Pitter NP and advised that the note stated to wear 2L of oxygen.  He says he does not and has never worn oxygen at night.  He spoke with [facility] and let them know that he was holding off on the test until he could get everything straightened out.  Beth,  Please advise if you want the ONO done on RA as patient does not wear any oxygen at night.  Thank you.

## 2020-07-04 NOTE — Telephone Encounter (Signed)
Reviewed blood draw results with pt. Pt stated understanding. Nothing further needed at this time.

## 2020-07-05 NOTE — Telephone Encounter (Signed)
ONO on room air if he does not wear oxygen at night please

## 2020-07-05 NOTE — Telephone Encounter (Signed)
Called and spoke with patient. He stated that it seems everyone is very confused about his O2. He is only using the O2 as needed when he first wakes up. He is not using the O2 for long periods of time during the day. He has never used O2 at night. He has a phone visit with Beth on Friday and wishes to discuss this in detail with her then.   He wants to hold off on the ONO for now.   Nothing further needed at time of call.

## 2020-07-07 ENCOUNTER — Encounter: Payer: Self-pay | Admitting: *Deleted

## 2020-07-07 DIAGNOSIS — Z87891 Personal history of nicotine dependence: Secondary | ICD-10-CM

## 2020-07-07 NOTE — Progress Notes (Signed)

## 2020-07-08 ENCOUNTER — Other Ambulatory Visit: Payer: Self-pay

## 2020-07-08 ENCOUNTER — Ambulatory Visit (INDEPENDENT_AMBULATORY_CARE_PROVIDER_SITE_OTHER): Payer: PPO | Admitting: Primary Care

## 2020-07-08 DIAGNOSIS — J9611 Chronic respiratory failure with hypoxia: Secondary | ICD-10-CM

## 2020-07-08 DIAGNOSIS — J449 Chronic obstructive pulmonary disease, unspecified: Secondary | ICD-10-CM

## 2020-07-08 NOTE — Telephone Encounter (Signed)
He is going to proceed with ONO, he told he that he could call [place] I believe

## 2020-07-08 NOTE — Progress Notes (Signed)
Virtual Visit via Telephone Note  I connected with [name] [name] on 07/08/20 at  2:00 PM EDT by telephone and verified that I am speaking with the correct person using two identifiers.  Location: Patient: Home Provider: Office   I discussed the limitations, risks, security and privacy concerns of performing an evaluation and management service by telephone and the availability of in person appointments. I also discussed with the patient that there may be a patient responsible charge related to this service. The patient expressed understanding and agreed to proceed.   History of Present Illness:  75 year old male, former smoker. 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 Patient of Dr. [name]. Following with lung cancer screening program. LDCT June 2020 showed lung-RADS 2. 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 Maintained on Breztri.   Previous [facility] pulmonary encounter:  03/30/2019 Patient presents today for follow-up visit. He is here to talk about starting oxygen. He had a 6 minute walk test at pulmonary rehab on March 1st and he desaturated to 84%. He now wears oxygen while exercising at pulmonary rehab. He has a pulse oximeter at home and states that his oxygen level will drop into the mid-80s on exertion but he recovers quickly with rest. He is compliant with Anoro once daily, occasionally uses his albuterol rescue inhaler before an activity. He has had some anxiety with hyperventilation. He spoke with PCP and he was given prescription for 10 tabs of xanax which lasted him 2-3 more months. Compliant with 20mg  lasix daily, leg swelling is at baseline. His weight typically is between 228-230 at home. He has received both pfizer covid vaccines. [facility] is in network, he is interested in [place]. He denies acute symptoms; no cough, chest tightness of wheezing.                        4/5/2021[name] Patient contacted today for 2 week follow-up. Started on Trelegy during last visit,  given two week sample. States that his breathing is some better and not worse. He has ups and downs. He would like to continue with Trelegy, prescription is cheaper than Anoro for 3 months supply. No issues with oxygen. Continues 2L on exertion. Attending pulmonary rehab. No current URI symptoms, wheezing, cough.   12/24/19- Dr. [name]  Last seen by me 02/2018 , he enrolled in pulmonary rehab on a 6-minute walk test was found to desaturate to 84%, started on portable oxygen 03/2019 and has done well with this.  He developed increased dyspnea for the last few months, weight increased as high as 238 pounds was started on Lasix, weight is now down to 231 pounds he is starting to feel better over the last 2 to 4 weeks.  He has also decreased alcohol intake, LFTs reviewed which were normal.  He also had cardiac work-up including CT coronaries and echocardiogram which were normal LV function no evidence of diastolic dysfunction  He has switch from Anoro to [place], took Trelegy in between but likes Breztri better He is compliant with oxygen, completed pulmonary rehab and has questions about activity  I reviewed low-dose CT chest  06/24/2020 Patient presents today for 6 month follow-up. He is currently on [employer], he needs refill/script sent to [facility] and me. He has been approved for patient assistance through January 2023.  Oxygen level reported low in the morning. He was sick in nov/Dec. He has not been able to go back to the gym. He  has no active cough, wheezing or chest tightness. In the morning with exertion his oxygen level will drop on RA. Associated anxiety. He is better in the afternoon. He is not wearing oxygen during the day in his house. He is also not wearing oxygen at night. He will uses POC if going to the grocery store or when exercising. During ambulatory walk test today he needed 5L pulsed O2. He takes xanax as needed which helps with his breathing, his PCP typically prescribed this but he  will not be seeing them for about a month. PMP reviewed without discrepancies.  07/08/2020- interim hx  Patient contacted today to review ONO results. During last visit we treated patient for a COPD exacerbation with prednisone taper. CXR on 06/24/20 with no acute cardiopulmonary process, chronic lung markings throughout. LDCT 07/05/20 showed lung RADS 2. Centrilobular and paraseptal emphysema with diffuse bronchial wall thickening.  Multiple small noncalcified and calcified pulmonary nodules are scattered throughout both lungs. These are not significantly changed compared with the previous exam. He is maintained on Breztri. We have referred him back to pulmonary rehab.   He reports that prednisone course helped a great deal, he plans on returning [facility].  We previously placed an order for an overnight oximetry on 2L which has not yet been completed. He feels he sleeps well at night. He wears 4-5L pulsed oxygen with exertion.    Observations/Objective:  - Able to speak in full sentences; no overt shortness of breath, wheezing or cough  Assessment and Plan:  COPD/emphysema  - Treated for AECOPD 2 weeks ago, symptoms improved significantly with oral prednisone - Continue Breztri Aerosphere two puffs twice a day  - Recent CT imaging in June showed centrilobular and paraseptal emphysema with diffuse bronchial wall thickening - Referred back to pulmonary rehab during last visit   Chronic respiratory failure - Compliance with oxygen is a challenge at times - Continue 4-5L POC with exertion - Checking ONO on 2L at night   Former smoker - LDCT 07/05/20 showed lungs RADS2, Multiple small noncalcified and calcified pulmonary nodules are scattered throughout both lungs. These are not significantly changed compared with the previous exam  Follow Up Instructions:  3 months with Dr. [name] or APP    I discussed the assessment and treatment plan with the patient. The patient was provided an opportunity to  ask questions and all were answered. The patient agreed with the plan and demonstrated an understanding of the instructions.   The patient was advised to call back or seek an in-person evaluation if the symptoms worsen or if the condition fails to improve as anticipated.  I provided 25 minutes of non-face-to-face time during this encounter.   Martyn Ehrich, NP

## 2020-07-12 NOTE — Patient Instructions (Signed)
3 month fu with Dr. [name] or sooner if needed Do ONO on 2L Continue to wear 4L oxygen with exertion

## 2020-07-25 ENCOUNTER — Telehealth: Payer: Self-pay | Admitting: Primary Care

## 2020-07-25 NOTE — Telephone Encounter (Signed)
07/19/20 ONO on RA showed that patient spent 183 mins with SpO2 <88%. Basal 87%, SpO2 low 81%. Please confirm that patient did in fact not wear his oxygen during the test. If above is correct, advise he continue to wear 2L oxygen at night. Renew nocturnal oxygen order with DME company on file if needed. Nothing further.

## 2020-07-26 ENCOUNTER — Telehealth (HOSPITAL_COMMUNITY): Payer: Self-pay

## 2020-07-26 NOTE — Telephone Encounter (Signed)
Pt insurance is active and benefits verified through [employer]. Co-pay $15.00, DED $0.00/$0.00 met, out of pocket $3,450.00/$186.34 met, co-insurance 0%. No pre-authorization required. Anna/[employer], 07/26/20 @ 2:13PM, KIS#[id number]   Will contact patient to see if he is interested in the Pulmonary Rehab Program.

## 2020-07-26 NOTE — Telephone Encounter (Signed)
Called and spoke with pt in regards to [facility], pt stated he is a member at [employer]. He is not interested in [facility] at this time.   Closed referral

## 2020-07-27 NOTE — Telephone Encounter (Signed)
Patient made aware of results of ONO. Patient also made aware that he will need to start wearing 2 L 02 at bedtime.

## 2020-07-28 ENCOUNTER — Other Ambulatory Visit: Payer: Self-pay | Admitting: *Deleted

## 2020-07-28 DIAGNOSIS — J9611 Chronic respiratory failure with hypoxia: Secondary | ICD-10-CM

## 2020-07-28 DIAGNOSIS — J439 Emphysema, unspecified: Secondary | ICD-10-CM

## 2020-07-28 DIAGNOSIS — J449 Chronic obstructive pulmonary disease, unspecified: Secondary | ICD-10-CM

## 2020-07-28 NOTE — Telephone Encounter (Signed)
Updated referral place for 2 Liter 02 qhs.

## 2020-07-31 DIAGNOSIS — J449 Chronic obstructive pulmonary disease, unspecified: Secondary | ICD-10-CM | POA: Diagnosis not present

## 2020-07-31 DIAGNOSIS — J9611 Chronic respiratory failure with hypoxia: Secondary | ICD-10-CM | POA: Diagnosis not present

## 2020-07-31 DIAGNOSIS — J439 Emphysema, unspecified: Secondary | ICD-10-CM | POA: Diagnosis not present

## 2020-07-31 DIAGNOSIS — J42 Unspecified chronic bronchitis: Secondary | ICD-10-CM | POA: Diagnosis not present

## 2020-08-31 DIAGNOSIS — J42 Unspecified chronic bronchitis: Secondary | ICD-10-CM | POA: Diagnosis not present

## 2020-08-31 DIAGNOSIS — J9611 Chronic respiratory failure with hypoxia: Secondary | ICD-10-CM | POA: Diagnosis not present

## 2020-08-31 DIAGNOSIS — J449 Chronic obstructive pulmonary disease, unspecified: Secondary | ICD-10-CM | POA: Diagnosis not present

## 2020-08-31 DIAGNOSIS — J439 Emphysema, unspecified: Secondary | ICD-10-CM | POA: Diagnosis not present

## 2020-10-04 ENCOUNTER — Ambulatory Visit: Payer: PPO | Admitting: Cardiovascular Disease

## 2020-10-07 ENCOUNTER — Ambulatory Visit: Payer: PPO | Admitting: Cardiovascular Disease

## 2020-10-18 ENCOUNTER — Other Ambulatory Visit: Payer: Self-pay

## 2020-10-18 ENCOUNTER — Ambulatory Visit: Payer: PPO | Admitting: Cardiovascular Disease

## 2020-10-18 ENCOUNTER — Encounter: Payer: Self-pay | Admitting: Cardiovascular Disease

## 2020-10-18 DIAGNOSIS — I2510 Atherosclerotic heart disease of native coronary artery without angina pectoris: Secondary | ICD-10-CM | POA: Diagnosis not present

## 2020-10-18 DIAGNOSIS — E782 Mixed hyperlipidemia: Secondary | ICD-10-CM

## 2020-10-18 DIAGNOSIS — Z87891 Personal history of nicotine dependence: Secondary | ICD-10-CM

## 2020-10-18 DIAGNOSIS — I10 Essential (primary) hypertension: Secondary | ICD-10-CM | POA: Diagnosis not present

## 2020-10-18 NOTE — Assessment & Plan Note (Signed)
History of essential hypertension a blood pressure measured today 132/68.  He is on amlodipine.

## 2020-10-18 NOTE — Progress Notes (Signed)
10/18/2020 [name] [name]   [birth year]  [id number]  Primary Physician Lujean Amel, MD Primary Cardiologist: Lorretta Harp MD Lupe Carney, [place]  HPI:  [name] [name] is a 75 y.o.  weight married Caucasian male father of 2 children, with no grandchildren, who is retired from working at [employer].  He was referred by Dr.[name], his PCP, because of diffuse coronary calcification seen on recent screening chest CT performed 07/03/2019.  I last saw him in the office 09/11/2019.  His risk factors include 80-pack-year tobacco abuse having quit 12 years ago, treated hypertension and hyperlipidemia.  There is no family history of heart disease.  He is never had a heart attack or stroke.  He denies chest pain but does get short of breath but has COPD as well.  Recent chest CT done for surveillance 07/03/2019 revealed diffuse calcification in all 3 coronary arteries.  Since I saw him a year ago he did have a 2D echo which was essentially normal with an EF of 65 to 70% and no valvular abnormalities.  He had a coronary CTA revealing coronary calcium score of 978 with a left dominant system and what appeared to be hemodynamically significant disease in the distal left circumflex probably unrelated to his symptoms.  He denies chest pain.  Current Meds  Medication Sig   albuterol (VENTOLIN HFA) 108 (90 Base) MCG/ACT inhaler Inhale 2 puffs into the lungs every 6 (six) hours as needed for wheezing or shortness of breath.   ALPRAZolam (XANAX) 0.25 MG tablet Take 1 tablet (0.25 mg total) by mouth daily as needed.   amLODipine (NORVASC) 5 MG tablet Take 5 mg by mouth daily.   atorvastatin (LIPITOR) 40 MG tablet Take 40 mg by mouth daily. Pt takes two twice daily   Budeson-Glycopyrrol-Formoterol (BREZTRI AEROSPHERE) 160-9-4.8 MCG/ACT AERO Inhale 2 puffs into the lungs in the morning and at bedtime.   furosemide (LASIX) 20 MG tablet Take 20 mg by mouth daily.   ibuprofen  (ADVIL,MOTRIN) 200 MG tablet Take 400 mg by mouth every 6 (six) hours as needed.   Multiple Vitamins-Minerals (MULTIVITAMIN WITH MINERALS) tablet Take 1 tablet by mouth daily.   vitamin B-12 (CYANOCOBALAMIN) 1000 MCG tablet Take 1,000 mcg by mouth.     Allergies  Allergen Reactions   Fluticasone-Salmeterol     Other reaction(s): worsening shortness of breath    Social History   Socioeconomic History   Marital status: Married    Spouse name: Not on file   Number of children: 2   Years of education: 16   Highest education level: Not on file  Occupational History   Occupation: retired  Tobacco Use   Smoking status: Former    Packs/day: 2.00    Years: 40.00    Pack years: 80.00    Types: Cigarettes    Quit date: 2010    Years since quitting: 12.7   Smokeless tobacco: Never  Vaping Use   Vaping Use: Not on file  Substance and Sexual Activity   Alcohol use: Yes    Alcohol/week: 12.0 standard drinks    Types: 12 Cans of beer per week   Drug use: No   Sexual activity: Not on file  Other Topics Concern   Not on file  Social History Narrative   Right handed   One story home   Social Determinants of Health   Financial Resource Strain: Not on file  Food Insecurity: Not on file  Transportation Needs: Not on  file  Physical Activity: Not on file  Stress: Not on file  Social Connections: Not on file  Intimate Partner Violence: Not on file     Review of Systems: General: negative for chills, fever, night sweats or weight changes.  Cardiovascular: negative for chest pain, dyspnea on exertion, edema, orthopnea, palpitations, paroxysmal nocturnal dyspnea or shortness of breath Dermatological: negative for rash Respiratory: negative for cough or wheezing Urologic: negative for hematuria Abdominal: negative for nausea, vomiting, diarrhea, bright red blood per rectum, melena, or hematemesis Neurologic: negative for visual changes, syncope, or dizziness All other systems  reviewed and are otherwise negative except as noted above.    Blood pressure 132/68, pulse 86, height 5\' 11"  (1.803 m), weight 235 lb (106.6 kg), SpO2 98 %.  General appearance: alert and no distress Neck: no adenopathy, no carotid bruit, no JVD, supple, symmetrical, trachea midline, and thyroid not enlarged, symmetric, no tenderness/mass/nodules Lungs: clear to auscultation bilaterally Heart: regular rate and rhythm, S1, S2 normal, no murmur, click, rub or gallop Extremities: extremities normal, atraumatic, no cyanosis or edema Pulses: 2+ and symmetric Skin: Skin color, texture, turgor normal. No rashes or lesions Neurologic: Grossly normal  EKG sinus rhythm at 83 without ST or T wave changes.  Personally reviewed this EKG.  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  We will continue to follow him clinically.  Essential hypertension History of essential hypertension a blood pressure measured today 132/68.  He is on amlodipine.  Hyperlipidemia History of hyperlipidemia on statin therapy with lipid profile performed 11/26/2019 revealing a total cholesterol 154, LDL of 36 and HDL of 109.  Former smoker History of tobacco abuse having quit 14 years ago with oxygen dependent COPD.  He has noticed increasing shortness of breath over the last year however.  He is seeing a pulmonologist.     Lorretta Harp MD [facility], [facility] 10/18/2020 10:42 AM

## 2020-10-18 NOTE — Assessment & Plan Note (Signed)
History of increased coronary calcium score of 978 with CTA showing a left dominant system and what appears to be a significant lesion in the distal dominant circumflex which I think probably is true true and unrelated to his symptoms.  We will continue to follow him clinically.

## 2020-10-18 NOTE — Patient Instructions (Signed)

## 2020-10-18 NOTE — Assessment & Plan Note (Signed)
History of tobacco abuse having quit 14 years ago with oxygen dependent COPD.  He has noticed increasing shortness of breath over the last year however.  He is seeing a pulmonologist.

## 2020-10-18 NOTE — Assessment & Plan Note (Signed)
History of hyperlipidemia on statin therapy with lipid profile performed 11/26/2019 revealing a total cholesterol 154, LDL of 36 and HDL of 109.

## 2020-12-09 DIAGNOSIS — F419 Anxiety disorder, unspecified: Secondary | ICD-10-CM | POA: Diagnosis not present

## 2020-12-09 DIAGNOSIS — I10 Essential (primary) hypertension: Secondary | ICD-10-CM | POA: Diagnosis not present

## 2020-12-09 DIAGNOSIS — I2510 Atherosclerotic heart disease of native coronary artery without angina pectoris: Secondary | ICD-10-CM | POA: Diagnosis not present

## 2020-12-09 DIAGNOSIS — Z0001 Encounter for general adult medical examination with abnormal findings: Secondary | ICD-10-CM | POA: Diagnosis not present

## 2020-12-09 DIAGNOSIS — I700 Atherosclerosis of aorta: Secondary | ICD-10-CM | POA: Diagnosis not present

## 2020-12-09 DIAGNOSIS — Z79899 Other long term (current) drug therapy: Secondary | ICD-10-CM | POA: Diagnosis not present

## 2020-12-09 DIAGNOSIS — E7800 Pure hypercholesterolemia, unspecified: Secondary | ICD-10-CM | POA: Diagnosis not present

## 2020-12-09 DIAGNOSIS — J449 Chronic obstructive pulmonary disease, unspecified: Secondary | ICD-10-CM | POA: Diagnosis not present

## 2020-12-15 ENCOUNTER — Other Ambulatory Visit: Payer: Self-pay | Admitting: Family Medicine

## 2020-12-15 DIAGNOSIS — R7401 Elevation of levels of liver transaminase levels: Secondary | ICD-10-CM

## 2020-12-21 ENCOUNTER — Other Ambulatory Visit: Payer: Self-pay

## 2020-12-21 ENCOUNTER — Encounter: Payer: Self-pay | Admitting: Pulmonary Disease

## 2020-12-21 ENCOUNTER — Ambulatory Visit: Payer: PPO | Admitting: Pulmonary Disease

## 2020-12-21 DIAGNOSIS — M7989 Other specified soft tissue disorders: Secondary | ICD-10-CM | POA: Diagnosis not present

## 2020-12-21 DIAGNOSIS — J449 Chronic obstructive pulmonary disease, unspecified: Secondary | ICD-10-CM

## 2020-12-21 DIAGNOSIS — J9611 Chronic respiratory failure with hypoxia: Secondary | ICD-10-CM | POA: Diagnosis not present

## 2020-12-21 MED ORDER — BREZTRI AEROSPHERE 160-9-4.8 MCG/ACT IN AERO: 2.0000 | INHALATION_SPRAY | Freq: Two times a day (BID) | RESPIRATORY_TRACT | 0 refills | Status: DC

## 2020-12-21 NOTE — Patient Instructions (Signed)
X sample of Breztri  Use oxygen all night  X Increase lasix to 40 mg daily x 3 days

## 2020-12-21 NOTE — Assessment & Plan Note (Signed)
Compliance with nocturnal oxygen is poor, emphasized use of oxygen through the night and continue POC 3 to 4 L in the daytime. I offered him home sleep testing but he wishes to defer at the current

## 2020-12-21 NOTE — Assessment & Plan Note (Signed)
Could be related to amlodipine but could also be related to chronic diastolic heart failure. Weight is up 5 pounds compared to last year Increase Lasix to 40 mg daily for 3 days and then back down to his usual dose of 20 mg

## 2020-12-21 NOTE — Assessment & Plan Note (Signed)
I provided him with a sample of Judithann Sauger today he is able to get supplies from the company otherwise. Will continue to use albuterol on an as-needed basis We discussed signs and symptoms of COPD exacerbation and plan for this. He does not want to get back to center-based rehab and I do think he has an element of deconditioning, [facility]

## 2020-12-21 NOTE — Progress Notes (Signed)
° °  Subjective:    Patient ID: [name] [name], male    DOB: 1945/04/26, 75 y.o.   MRN: [id number]  HPI   75 yo heavy ex-smoker  For FU of  COPD. He smoked about a pack per day starting as a teenager until he quit in 2010- about 40 pack years. -on portable oxygen 03/2019  -completed pulmonary rehab 2020  52-month follow-up visit He is able to obtain Breztri through the company but for some reason has run out over the past week and requests a sample. He feels like he is using oxygen more constantly now, uses 2 L continuous at home and uses POC 3 to 4 L when he goes out.  He is not good about using nocturnal oxygen and feels uncomfortable with nasal cannula in his nose.  I reviewed nocturnal oximetry from July.  Weight has increased 5 pounds over the past year to his current weight of 236 pounds he reports 1+ bipedal edema in spite of taking Lasix 20 mg daily.  Also note that he is on amlodipine I reviewed low-dose CT chest and last x-ray  Significant tests/ events reviewed  07/2020 ONO /RA -desaturation less than 88% for 23 minutes  LDCT 07/05/20 showed lung RADS 2. Centrilobular and paraseptal emphysema , unchanged nodules compared to 2020   PFTs 09/2012 moderate reversible airway obstruction, ratio 49, FEV1 44%, improved to 56%/25% bronchodilator response, TLC 88%, DLCO 55%   Spirometry 03/18/2018 showed ratio of 61, FEV1 of 43% and FVC of 52%   Past Medical History:  Diagnosis Date   COPD (chronic obstructive pulmonary disease) ([place])      Review of Systems neg for any significant sore throat, dysphagia, itching, sneezing, nasal congestion or excess/ purulent secretions, fever, chills, sweats, unintended wt loss, pleuritic or exertional cp, hempoptysis, orthopnea pnd or change in chronic leg swelling. Also denies presyncope, palpitations, heartburn, abdominal pain, nausea, vomiting, diarrhea or change in bowel or urinary habits, dysuria,hematuria, rash, arthralgias, visual  complaints, headache, numbness weakness or ataxia.     Objective:   Physical Exam  Gen. Pleasant, obese, in no distress ENT - no lesions, no post nasal drip, class 2 airway Neck: No JVD, no thyromegaly, no carotid bruits Lungs: no use of accessory muscles, no dullness to percussion, decreased without rales or rhonchi  Cardiovascular: Rhythm regular, heart sounds  normal, no murmurs or gallops, 1+  peripheral edema Musculoskeletal: No deformities, no cyanosis or clubbing , no tremors         Assessment & Plan:

## 2021-01-04 ENCOUNTER — Ambulatory Visit
Admission: RE | Admit: 2021-01-04 | Discharge: 2021-01-04 | Disposition: A | Discharge status: Home / Self Care | Payer: PPO | Source: Ambulatory Visit | Attending: Family Medicine | Admitting: Family Medicine

## 2021-01-04 DIAGNOSIS — K760 Fatty (change of) liver, not elsewhere classified: Secondary | ICD-10-CM | POA: Diagnosis not present

## 2021-01-04 DIAGNOSIS — R748 Abnormal levels of other serum enzymes: Secondary | ICD-10-CM | POA: Diagnosis not present

## 2021-01-04 DIAGNOSIS — R7401 Elevation of levels of liver transaminase levels: Secondary | ICD-10-CM

## 2021-01-12 ENCOUNTER — Telehealth: Payer: Self-pay | Admitting: Pulmonary Disease

## 2021-01-12 DIAGNOSIS — R7401 Elevation of levels of liver transaminase levels: Secondary | ICD-10-CM | POA: Diagnosis not present

## 2021-01-12 MED ORDER — ALBUTEROL SULFATE HFA 108 (90 BASE) MCG/ACT IN AERS: 2.0000 | INHALATION_SPRAY | Freq: Four times a day (QID) | RESPIRATORY_TRACT | 5 refills | Status: DC | PRN

## 2021-01-12 NOTE — Telephone Encounter (Signed)
Refill of pt's albuterol inhaler has been sent to preferred pharmacy for pt. Called and spoke with pt letting him know this had been done and he verbalized understanding. Nothing further needed.

## 2021-02-07 DIAGNOSIS — L570 Actinic keratosis: Secondary | ICD-10-CM | POA: Diagnosis not present

## 2021-02-07 DIAGNOSIS — Z23 Encounter for immunization: Secondary | ICD-10-CM | POA: Diagnosis not present

## 2021-02-07 DIAGNOSIS — L308 Other specified dermatitis: Secondary | ICD-10-CM | POA: Diagnosis not present

## 2021-02-07 DIAGNOSIS — D485 Neoplasm of uncertain behavior of skin: Secondary | ICD-10-CM | POA: Diagnosis not present

## 2021-02-07 DIAGNOSIS — L309 Dermatitis, unspecified: Secondary | ICD-10-CM | POA: Diagnosis not present

## 2021-02-18 DIAGNOSIS — S81812A Laceration without foreign body, left lower leg, initial encounter: Secondary | ICD-10-CM | POA: Diagnosis not present

## 2021-02-18 DIAGNOSIS — L03119 Cellulitis of unspecified part of limb: Secondary | ICD-10-CM | POA: Diagnosis not present

## 2021-02-23 DIAGNOSIS — S81801D Unspecified open wound, right lower leg, subsequent encounter: Secondary | ICD-10-CM | POA: Diagnosis not present

## 2021-02-23 DIAGNOSIS — L03115 Cellulitis of right lower limb: Secondary | ICD-10-CM | POA: Diagnosis not present

## 2021-03-01 DIAGNOSIS — L570 Actinic keratosis: Secondary | ICD-10-CM | POA: Diagnosis not present

## 2021-03-01 DIAGNOSIS — L720 Epidermal cyst: Secondary | ICD-10-CM | POA: Diagnosis not present

## 2021-03-01 DIAGNOSIS — L821 Other seborrheic keratosis: Secondary | ICD-10-CM | POA: Diagnosis not present

## 2021-03-01 DIAGNOSIS — Z23 Encounter for immunization: Secondary | ICD-10-CM | POA: Diagnosis not present

## 2021-03-01 DIAGNOSIS — Z85828 Personal history of other malignant neoplasm of skin: Secondary | ICD-10-CM | POA: Diagnosis not present

## 2021-03-01 DIAGNOSIS — L97919 Non-pressure chronic ulcer of unspecified part of right lower leg with unspecified severity: Secondary | ICD-10-CM | POA: Diagnosis not present

## 2021-03-01 DIAGNOSIS — I872 Venous insufficiency (chronic) (peripheral): Secondary | ICD-10-CM | POA: Diagnosis not present

## 2021-03-01 DIAGNOSIS — Z86018 Personal history of other benign neoplasm: Secondary | ICD-10-CM | POA: Diagnosis not present

## 2021-03-01 DIAGNOSIS — L578 Other skin changes due to chronic exposure to nonionizing radiation: Secondary | ICD-10-CM | POA: Diagnosis not present

## 2021-03-01 DIAGNOSIS — D225 Melanocytic nevi of trunk: Secondary | ICD-10-CM | POA: Diagnosis not present

## 2021-03-01 DIAGNOSIS — L814 Other melanin hyperpigmentation: Secondary | ICD-10-CM | POA: Diagnosis not present

## 2021-03-20 NOTE — Progress Notes (Signed)
[name] [name], [name] [name] ([id number]) Visit Report for 03/21/2021 Allergy List Details Patient Name: Date of Service: [name] [name], [name] [name] IN J. 03/21/2021 1:15 PM Medical Record Number: [id number] Patient Account Number: [id number] Date of Birth/Sex: Treating RN: [birth year] (76 y.o. [name] Primary Care Kemia Wendel: Other Clinician: Referring Keiry Kowal: Treating [place] Ma/Extender: Jadene Pierini in Treatment: 0 Allergies Active Allergies fluticasone salmetrol Reaction: SOB Type: Medication Allergy Notes Electronic Signature(s) Signed: 03/20/2021 4:59:49 PM By: [name] Entered By: [name] on 03/20/2021 16:59:49

## 2021-03-21 ENCOUNTER — Other Ambulatory Visit: Payer: Self-pay

## 2021-03-21 ENCOUNTER — Encounter (HOSPITAL_BASED_OUTPATIENT_CLINIC_OR_DEPARTMENT_OTHER): Payer: PPO | Attending: Internal Medicine | Admitting: General Surgery

## 2021-03-21 DIAGNOSIS — L97812 Non-pressure chronic ulcer of other part of right lower leg with fat layer exposed: Secondary | ICD-10-CM | POA: Diagnosis not present

## 2021-03-21 DIAGNOSIS — L97211 Non-pressure chronic ulcer of right calf limited to breakdown of skin: Secondary | ICD-10-CM | POA: Diagnosis not present

## 2021-03-21 DIAGNOSIS — J9611 Chronic respiratory failure with hypoxia: Secondary | ICD-10-CM | POA: Diagnosis not present

## 2021-03-21 DIAGNOSIS — I2510 Atherosclerotic heart disease of native coronary artery without angina pectoris: Secondary | ICD-10-CM | POA: Insufficient documentation

## 2021-03-21 DIAGNOSIS — I87309 Chronic venous hypertension (idiopathic) without complications of unspecified lower extremity: Secondary | ICD-10-CM | POA: Insufficient documentation

## 2021-03-21 DIAGNOSIS — B952 Enterococcus as the cause of diseases classified elsewhere: Secondary | ICD-10-CM | POA: Diagnosis not present

## 2021-03-21 DIAGNOSIS — I5032 Chronic diastolic (congestive) heart failure: Secondary | ICD-10-CM | POA: Diagnosis not present

## 2021-03-21 DIAGNOSIS — J449 Chronic obstructive pulmonary disease, unspecified: Secondary | ICD-10-CM | POA: Diagnosis not present

## 2021-03-21 DIAGNOSIS — I110 Hypertensive heart disease with heart failure: Secondary | ICD-10-CM | POA: Diagnosis not present

## 2021-03-21 DIAGNOSIS — Z87891 Personal history of nicotine dependence: Secondary | ICD-10-CM | POA: Insufficient documentation

## 2021-03-21 DIAGNOSIS — I10 Essential (primary) hypertension: Secondary | ICD-10-CM | POA: Diagnosis not present

## 2021-03-21 NOTE — Progress Notes (Signed)
[name] [name], [name] ([id number]) ?Visit Report for 03/21/2021 ?Abuse Risk Screen Details ?Patient Name: Date of Service: ?[name] [name], [name] 03/21/2021 1:15 PM ?Medical Record Number: [id number] ?Patient Account Number: [id number] ?Date of Birth/Sex: Treating RN: ?[birth year] (76 y.o. [name] [name] ?Primary Care [name] [name]: [name] [name] Other Clinician: ?Referring [name] [name]: ?Treating [name] [name]/Extender: [name] [name] ?[name], [name] ?Weeks in Treatment: 0 ?Abuse Risk Screen Items ?Answer ?ABUSE RISK SCREEN: ?Has anyone close to you tried to hurt or harm you recentlyo No ?Do you feel uncomfortable with anyone in your familyo No ?Has anyone forced you do things that you didnt want to doo No ?Electronic Signature(s) ?Signed: 03/21/2021 5:20:09 PM By: [name] [name] ?Entered By: [name] [name] on 03/21/2021 13:25:49 ?-------------------------------------------------------------------------------- ?Activities of Daily Living Details ?Patient Name: Date of Service: ?[name] [name], [name] 03/21/2021 1:15 PM ?Medical Record Number: [id number] ?Patient Account Number: [id number] ?Date of Birth/Sex: Treating RN: ?[birth year] (76 y.o. [name] [name] ?Primary Care Burnette Sautter: [name] [name] Other Clinician: ?Referring [name] [name]: ?Treating [name] [name]/Extender: [name] [name] ?[name], [name] ?Weeks in Treatment: 0 ?Activities of Daily Living Items ?Answer ?Activities of Daily Living (Please select one for each item) ?[name] [name] ?T Medications ?ake Completely Able ?Use T elephone Completely Able ?Care for Appearance Completely Able ?Use T oilet Completely Able ?Bath / Shower Completely Able ?Dress Self Completely Able ?Feed Self Completely Able ?Walk Completely Able ?Get In / Out Bed Completely Able ?Housework Completely Able ?Prepare Meals Completely Able ?Handle Money Completely Able ?Shop for Self Completely Able ?Electronic Signature(s) ?Signed: 03/21/2021 5:20:09 PM By: [name] [name] ?Entered By:  [name] [name] on 03/21/2021 13:26:21 ?-------------------------------------------------------------------------------- ?Education Screening Details ?Patient Name: ?Date of Service: ?[name] [name], [name] 03/21/2021 1:15 PM ?Medical Record Number: [id number] ?Patient Account Number: [id number] ?Date of Birth/Sex: ?Treating RN: ?August 06, 1945 (76 y.o. [name] [name] ?Primary Care [name] [name]: [name] [name] ?Other Clinician: ?Referring [name] [name]: ?Treating [name] [name]/Extender: [name] [name] ?[name], [name] ?Weeks in Treatment: 0 ?Primary Learner Assessed: Patient ?Learning Preferences/Education Level/Primary Language ?Learning Preference: Explanation, Demonstration, Printed Material ?Highest Education Level: College or Above ?Preferred Language: English ?Cognitive Barrier ?Language Barrier: No ?Translator Needed: No ?Memory Deficit: No ?Emotional Barrier: No ?Cultural/Religious Beliefs Affecting Medical Care: No ?Physical Barrier ?Impaired Vision: No ?Impaired Hearing: No ?Decreased Hand dexterity: No ?Knowledge/Comprehension ?Knowledge Level: High ?Comprehension Level: High ?Ability to understand written instructions: High ?Ability to understand verbal instructions: High ?Motivation ?Anxiety Level: Calm ?Cooperation: Cooperative ?Education Importance: Acknowledges Need ?Interest in Health Problems: Asks Questions ?Perception: Coherent ?Willingness to Engage in Self-Management High ?Activities: ?Readiness to Engage in Self-Management High ?Activities: ?Electronic Signature(s) ?Signed: 03/21/2021 5:20:09 PM By: [name] [name] ?Entered By: [name] [name] on 03/21/2021 13:26:50 ?-------------------------------------------------------------------------------- ?Fall Risk Assessment Details ?Patient Name: ?Date of Service: ?[name] [name], [name] 03/21/2021 1:15 PM ?Medical Record Number: [id number] ?Patient Account Number: [id number] ?Date of Birth/Sex: ?Treating RN: ?1946-01-02 (76 y.o. [name] [name] ?Primary Care Argenis Kumari:  [name] [name] ?Other Clinician: ?Referring [name] [name]: ?Treating [name] [name]: [name] [name] ?[name], [name] ?Weeks in Treatment: 0 ?Fall Risk Assessment Items ?Have you had 2 or more falls in the last 12 monthso 0 No ?Have you had any fall that resulted in injury in the last 12 monthso 0 No ?FALLS RISK SCREEN ?History of falling - immediate or within 3 months 0 No ?Secondary diagnosis (Do you have 2 or more medical diagnoseso) 15 Yes ?Ambulatory aid ?None/bed rest/wheelchair/nurse 0 Yes ?Crutches/cane/walker 0 No ?Furniture 0 No ?Intravenous therapy Access/Saline/Heparin Lock 0 No ?Gait/Transferring ?Normal/ bed rest/ wheelchair 0 Yes ?Weak (short steps with or without shuffle, stooped but  able to lift head while walking, may seek 0 No ?support from furniture) ?Impaired (short steps with shuffle, may have difficulty arising from chair, head down, impaired 0 No ?balance) ?Mental Status ?Oriented to own ability 0 Yes ?Electronic Signature(s) ?Signed: 03/21/2021 5:20:09 PM By: [name] [name] ?Entered By: [name] [name] on 03/21/2021 13:27:19 ?-------------------------------------------------------------------------------- ?Foot Assessment Details ?Patient Name: ?Date of Service: ?[name] [name], [name] 03/21/2021 1:15 PM ?Medical Record Number: [id number] ?Patient Account Number: [id number] ?Date of Birth/Sex: ?Treating RN: ?1945/02/07 (76 y.o. [name] [name] ?Primary Care [name] [name]: [name] [name] ?Other Clinician: ?Referring [name] [name]: ?Treating [name] [name]/Extender: [name] [name] ?[name], [name] ?Weeks in Treatment: 0 ?Foot Assessment Items ?Site Locations ?+ = Sensation present, - = Sensation absent, C = Callus, U = Ulcer ?R = Redness, W = Warmth, M = Maceration, PU = Pre-ulcerative lesion ?F = Fissure, S = Swelling, D = Dryness ?Assessment ?Right: Left: ?Other Deformity: No No ?Prior Foot Ulcer: No No ?Prior Amputation: No No ?Charcot Joint: No No ?Ambulatory Status: Ambulatory Without Help ?Gait:  Steady ?Electronic Signature(s) ?Signed: 03/21/2021 5:20:09 PM By: [name] [name] ?Entered By: [name] [name] on 03/21/2021 13:29:38 ?-------------------------------------------------------------------------------- ?Nutrition Risk Screening Details ?Patient Name: ?Date of Service: ?[name] [name], [name] 03/21/2021 1:15 PM ?Medical Record Number: [id number] ?Patient Account Number: [id number] ?Date of Birth/Sex: ?Treating RN: ?1945-05-01 (76 y.o. [name] [name] ?Primary Care Vikram Tillett: [name] [name] ?Other Clinician: ?Referring Gwynn Crossley: ?Treating Kamaryn Grimley/Extender: [name] [name] ?[name], [name] ?Weeks in Treatment: 0 ?Height (in): 71 ?Weight (lbs): 238 ?Body Mass Index (BMI): 33.2 ?Nutrition Risk Screening Items ?Score Screening ?NUTRITION RISK SCREEN: ?I have an illness or condition that made me change the kind and/or amount of food I eat 0 No ?I eat fewer than two meals per day 0 No ?I eat few fruits and vegetables, or milk products 0 No ?I have three or more drinks of beer, liquor or wine almost every day 0 No ?I have tooth or mouth problems that make it hard for me to eat 0 No ?I don't always have enough money to buy the food I need 0 No ?I eat alone most of the time 0 No ?I take three or more different prescribed or over-the-counter drugs a day 1 Yes ?Without wanting to, I have lost or gained 10 pounds in the last six months 0 No ?I am not always physically able to shop, cook and/or feed myself 0 No ?Nutrition Protocols ?Good Risk Protocol 0 No interventions needed ?Moderate Risk Protocol ?High Risk Proctocol ?Risk Level: Good Risk ?Score: 1 ?Electronic Signature(s) ?Signed: 03/21/2021 5:20:09 PM By: [name] [name] ?Entered By: [name] [name] on 03/21/2021 13:27:30 ?

## 2021-03-22 NOTE — Progress Notes (Signed)
Police, [name] ([id number]) ?Visit Report for 03/21/2021 ?Chief Complaint Document Details ?Patient Name: Date of Service: ?[name] [name], [name] 03/21/2021 1:15 PM ?Medical Record Number: [id number] ?Patient Account Number: [id number] ?Date of Birth/Sex: Treating RN: ?[birth year] (76 y.o. M) ?Primary Care Provider: [name], [name]: ?Referring Provider: ?Treating Provider/Extender: [name] ?[name], [name] ?Weeks in Treatment: 0 ?Information Obtained from: Patient ?Chief Complaint ?Patient seen for complaints of Non-Healing Wound. ?Electronic Signature(s) ?Signed: 03/22/2021 8:04:00 AM By: [name] MD FACS ?Entered By: [name] on 03/22/2021 08:03:59 ?-------------------------------------------------------------------------------- ?Debridement Details ?Patient Name: Date of Service: ?[name] [name], [name] 03/21/2021 1:15 PM ?Medical Record Number: [id number] ?Patient Account Number: [id number] ?Date of Birth/Sex: Treating RN: ?08-11-1945 (76 y.o. [name] ?Primary Care Provider: [name], [name]: ?Referring Provider: ?Treating Provider/Extender: [name] ?[name], [name] ?Weeks in Treatment: 0 ?Debridement Performed for Assessment: Wound #1 Right,Anterior Lower Leg ?Performed By: Physician [name], MD ?Debridement Type: Debridement ?Level of Consciousness (Pre-procedure): Awake and Alert ?Pre-procedure Verification/Time Out Yes - 13:49 ?Taken: ?Start Time: 13:50 ?Pain Control: ?Other : benzocaine 20% ?T Area Debrided (L x W): ?otal 2.1 (cm) x 2.6 (cm) = 5.46 (cm?) ?Tissue and other material debrided: Non-Viable, Slough, Subcutaneous, [place]acia [place] ?Level: Skin/Subcutaneous Tissue ?Debridement Description: Excisional ?Instrument: Curette ?Specimen: Tissue Culture ?Number of Specimens T aken: 1 ?Bleeding: Minimum ?Hemostasis Achieved: Pressure ?End Time: 13:56 ?Response to Treatment: Procedure was tolerated well ?Level of Consciousness (Post- Awake  and Alert ?procedure): ?Post Debridement Measurements of Total Wound ?Length: (cm) 2.1 ?Width: (cm) 2.6 ?Depth: (cm) 0.1 ?Volume: (cm?) 0.429 ?Character of Wound/Ulcer Post Debridement: Stable ?Post Procedure Diagnosis ?Same as Pre-procedure ?Electronic Signature(s) ?Signed: 03/21/2021 5:20:09 PM By: [name] ?Signed: 03/22/2021 6:42:44 PM By: [name] MD FACS ?Entered By: [name] on 03/21/2021 13:57:41 ?-------------------------------------------------------------------------------- ?HPI Details ?Patient Name: Date of Service: ?[name] [name], [name] 03/21/2021 1:15 PM ?Medical Record Number: [id number] ?Patient Account Number: [id number] ?Date of Birth/Sex: Treating RN: ?Jul 28, 1945 (76 y.o. M) ?Primary Care Provider: [name], [name]: ?Referring Provider: ?Treating Provider/Extender: [name] ?[name], [name] ?Weeks in Treatment: 0 ?History of Present Illness ?HPI Description: ADMISSION ?03/21/21 ?This is a 76 year old man with a past medical history notable for COPD and chronic hypoxic respiratory failure, requiring 4 L of oxygen, coronary artery ?disease, hypertension, and bilateral lower extremity chronic edema. On February 9, he struck his right leg with a car door which opened up a wound. It has ?failed to heal since that time. He saw a dermatologist that prescribed MetroGel, with little improvement. He was placed on Keflex and has a few days left of ?this antibiotic. He does wear support stockings on a regular basis, but the compression is fairly low. ABI in clinic today was 1.29. The wound itself has thick ?yellow slough, but no surrounding erythema or induration. No foul odor. He does not have significant pain. ?Electronic Signature(s) ?Signed: 03/22/2021 8:08:34 AM By: [name] MD FACS ?Entered By: [name] on 03/22/2021 08:08:34 ?-------------------------------------------------------------------------------- ?Physical Exam Details ?Patient Name: Date of  Service: ?[name] [name], [name] 03/21/2021 1:15 PM ?Medical Record Number: [id number] ?Patient Account Number: [id number] ?Date of Birth/Sex: Treating RN: ?26-Jun-1945 (76 y.o. M) ?Primary Care Provider: [name], [name]: ?Referring Provider: ?Treating Provider/Extender: [name] ?[name], [name] ?Weeks in Treatment: 0 ?Constitutional ?Mildly tachycardic, asymptomatic, secondary to exertion. He was tachypneic on arrival to clinic, secondary to exertion. This resolved while he was here.. . No ?acute distress. ?Respiratory ?Normal work of breathing on 4 L nasal cannula oxygen.Marland Kitchen ?Cardiovascular ?2+  pitting edema to the bilateral lower extremities. Both legs have some red discoloration, suggestive of stasis dermatitis, but no warmth or induration to ?suggest cellulitis.Marland Kitchen ?Notes ?03/21/2021: Wound examon his right lateral calf, there is a wound with substantial thick yellow slough. The periwound is somewhat macerated. No significant ?drainage identified and no foul odor. ?Electronic Signature(s) ?Signed: 03/22/2021 8:11:02 AM By: [name] MD FACS ?Signed: 03/22/2021 8:11:02 AM By: [name] MD FACS ?Entered By: [name] on 03/22/2021 08:11:02 ?-------------------------------------------------------------------------------- ?Physician Orders Details ?Patient Name: ?Date of Service: ?[name] [name], [name] 03/21/2021 1:15 PM ?Medical Record Number: [id number] ?Patient Account Number: [id number] ?Date of Birth/Sex: ?Treating RN: ?[birth year] (76 y.o. [name] ?Primary Care Provider: [name], [name] ?Other Clinician: ?Referring Provider: ?Treating Provider/Extender: [name] ?[name], [name] ?Weeks in Treatment: 0 ?Verbal / Phone Orders: No ?Diagnosis Coding ?ICD-10 Coding ?Code Description ?L97.211 Non-pressure chronic ulcer of right calf limited to breakdown of skin ?J96.11 Chronic respiratory failure with hypoxia ?J44.9 Chronic obstructive pulmonary disease,  unspecified ?N62.95 Chronic diastolic (congestive) heart failure ?I87.309 Chronic venous hypertension (idiopathic) without complications of unspecified lower extremity ?I25.10 Atherosclerotic heart disease of native coronary artery without angina pectoris ?Follow-up Appointments ?ppointment in 1 week. - with Dr. Celine Ahr (any available day) ?Return A ?Bathing/ Shower/ Hygiene ?May shower with protection but do not get wound dressing(s) wet. - Can use cast protector bag: May get at [facility], [place] or [place] ?Edema Control - Lymphedema / SCD / Other ?Elevate legs to the level of the heart or above for 30 minutes daily and/or when sitting, a frequency of: - throughout the day ?Avoid standing for long periods of time. ?Patient to wear own compression stockings every day. - Left Leg ?Other Edema Control Orders/Instructions: - Increase Lasix for next day or two ?Wound Treatment ?Wound #1 - Lower Leg Wound Laterality: Right, Anterior ?Cleanser: Soap and Water 1 x Per Week ?Discharge Instructions: May shower and wash wound with dial antibacterial soap and water prior to dressing change. ?Cleanser: Wound Cleanser 1 x Per Week ?Discharge Instructions: Cleanse the wound with wound cleanser prior to applying a clean dressing using gauze sponges, not tissue or cotton balls. ?Peri-Wound Care: Triamcinolone 15 (g) 1 x Per Week ?Discharge Instructions: Use triamcinolone 15 (g) as directed ?Peri-Wound Care: Zinc Oxide Ointment 30g tube 1 x Per Week ?Discharge Instructions: Apply Zinc Oxide to periwound with each dressing change ?Peri-Wound Care: Sween Lotion (Moisturizing lotion) 1 x Per Week ?Discharge Instructions: Apply moisturizing lotion as directed ?Prim Dressing: IODOFLEX 0.9% Cadexomer Iodine Pad 4x6 cm 1 x Per Week ?ary ?Discharge Instructions: Apply to wound bed as instructed ?Secondary Dressing: Zetuvit Plus 4x8 in 1 x Per Week ?Discharge Instructions: Apply over primary dressing as directed. ?Compression Wrap: FourPress (4  layer compression wrap) 1 x Per Week ?Discharge Instructions: Apply four layer compression as directed. May also use Miliken CoFlex 2 layer compression system as ?alternative. ?Laboratory ?naerobe culture (MICRO)

## 2021-03-27 ENCOUNTER — Encounter (HOSPITAL_BASED_OUTPATIENT_CLINIC_OR_DEPARTMENT_OTHER): Payer: PPO | Admitting: General Surgery

## 2021-03-27 ENCOUNTER — Other Ambulatory Visit: Payer: Self-pay

## 2021-03-27 DIAGNOSIS — J9611 Chronic respiratory failure with hypoxia: Secondary | ICD-10-CM | POA: Diagnosis not present

## 2021-03-27 DIAGNOSIS — I87301 Chronic venous hypertension (idiopathic) without complications of right lower extremity: Secondary | ICD-10-CM | POA: Diagnosis not present

## 2021-03-27 DIAGNOSIS — L97211 Non-pressure chronic ulcer of right calf limited to breakdown of skin: Secondary | ICD-10-CM | POA: Diagnosis not present

## 2021-03-27 DIAGNOSIS — I5032 Chronic diastolic (congestive) heart failure: Secondary | ICD-10-CM | POA: Diagnosis not present

## 2021-03-27 DIAGNOSIS — L97812 Non-pressure chronic ulcer of other part of right lower leg with fat layer exposed: Secondary | ICD-10-CM | POA: Diagnosis not present

## 2021-03-27 NOTE — Progress Notes (Signed)
[name] [name], [name] ([id number]) ?Visit Report for 03/27/2021 ?Chief Complaint Document Details ?Patient Name: Date of Service: ?[name] [name], [name] 03/27/2021 1:15 PM ?Medical Record Number: [id number] ?Patient Account Number: [id number] ?Date of Birth/Sex: Treating RN: ?[birth year] (76 y.o. [name] [name] ?Primary Care Provider: Dorthy [name], [name] Other Clinician: ?Referring Provider: ?Treating Provider/Extender: [name] [name] ?[name] [name] ?Weeks in Treatment: 0 ?Information Obtained from: Patient ?Chief Complaint ?Patient seen for complaints of Non-Healing Wound. ?Electronic Signature(s) ?Signed: 03/27/2021 1:59:01 PM By: [name] [name] ?Entered By: [name] [name] on 03/27/2021 13:59:01 ?-------------------------------------------------------------------------------- ?Debridement Details ?Patient Name: Date of Service: ?[name] [name], [name] 03/27/2021 1:15 PM ?Medical Record Number: [id number] ?Patient Account Number: [id number] ?Date of Birth/Sex: Treating RN: ?1945-10-27 (76 y.o. [name] [name] ?Primary Care Provider: Dorthy [name], [name] Other Clinician: ?Referring Provider: ?Treating Provider/Extender: [name] [name] ?[name] [name] ?Weeks in Treatment: 0 ?Debridement Performed for Assessment: Wound #1 Right,Anterior Lower Leg ?Performed By: Physician [name] Maudlin, MD ?Debridement Type: Debridement ?Level of Consciousness (Pre-procedure): Awake and Alert ?Pre-procedure Verification/Time Out Yes - 13:50 ?Taken: ?Start Time: 13:52 ?Pain Control: Lidocaine 4% T opical Solution ?T Area Debrided (L x W): ?otal 2 (cm) x 2.4 (cm) = 4.8 (cm?) ?Tissue and other material debrided: Viable, Non-Viable, Slough, Subcutaneous, Slough ?Level: Skin/Subcutaneous Tissue ?Debridement Description: Excisional ?Instrument: Curette ?Bleeding: Minimum ?Hemostasis Achieved: Pressure ?Procedural Pain: 0 ?Post Procedural Pain: 0 ?Response to Treatment: Procedure was tolerated well ?Level of Consciousness (Post- Awake  and Alert ?procedure): ?Post Debridement Measurements of Total Wound ?Length: (cm) 2 ?Width: (cm) 2.4 ?Depth: (cm) 0.2 ?Volume: (cm?) 0.754 ?Character of Wound/Ulcer Post Debridement: Improved ?Post Procedure Diagnosis ?Same as Pre-procedure ?Electronic Signature(s) ?Signed: 03/27/2021 3:36:11 PM By: [name] [name] ?Signed: 03/27/2021 6:11:59 PM By: [name] [name] ?Entered By: [name] Gouty on 03/27/2021 13:56:30 ?-------------------------------------------------------------------------------- ?HPI Details ?Patient Name: Date of Service: ?[name] [name], [name] 03/27/2021 1:15 PM ?Medical Record Number: [id number] ?Patient Account Number: [id number] ?Date of Birth/Sex: Treating RN: ?[birth year] (76 y.o. [name] [name] ?Primary Care Provider: Dorthy [name], [name] Other Clinician: ?Referring Provider: ?Treating Provider/Extender: [name] [name] ?[name] [name] ?Weeks in Treatment: 0 ?History of Present Illness ?HPI Description: ADMISSION ?03/21/21 ?This is a 76 year old man with a past medical history notable for COPD and chronic hypoxic respiratory failure, requiring 4 L of oxygen, coronary artery ?disease, hypertension, and bilateral lower extremity chronic edema. On February 9, he struck his right leg with a car door which opened up a wound. It has ?failed to heal since that time. He saw a dermatologist that prescribed MetroGel, with little improvement. He was placed on Keflex and has a few days left of ?this antibiotic. He does wear support stockings on a regular basis, but the compression is fairly low. ABI in clinic today was 1.29. The wound itself has thick ?yellow slough, but no surrounding erythema or induration. No foul odor. He does not have significant pain. ?03/27/2021: Last week, I took a PCR culture which was positive for Enterococcus faecalis. I initiated him on Augmentin and he is tolerating this well. Today, the ?wound is much cleaner and is coming in size. He did not love the 4-layer  compression, but he is willing to tolerate it to get the wound closed. He has been in ?Iodoflex under the compression wrap. ?Electronic Signature(s) ?Signed: 03/27/2021 2:00:03 PM By: [name] [name] ?Entered By: [name] [name] on 03/27/2021 14:00:03 ?-------------------------------------------------------------------------------- ?Physical Exam Details ?Patient Name: Date of Service: ?[name] [name], [name] 03/27/2021 1:15 PM ?Medical Record Number: [id number] ?Patient Account Number: [id number] ?Date  of Birth/Sex: Treating RN: ?[birth year] (76 y.o. [name] [name] ?Primary Care Provider: Dorthy [name], [name] Other Clinician: ?Referring Provider: ?Treating Provider/Extender: [name] [name] ?[name] [name] ?Weeks in Treatment: 0 ?Constitutional ?. He is slightly tachycardic, asymptomatic.. . . No acute distress. ?Respiratory ?Normal work of breathing on room air; supplemental oxygen sitting in the bedside chair.Marland Kitchen ?Notes ?03/27/2021: Wound examon the right lateral calf, the wound has minimal slough and the periwound skin is intact. No odor. Granulation tissue forming. ?Electronic Signature(s) ?Signed: 03/27/2021 2:01:11 PM By: [name] [name] ?Entered By: [name] [name] on 03/27/2021 14:01:10 ?-------------------------------------------------------------------------------- ?Physician Orders Details ?Patient Name: ?Date of Service: ?[name] [name], [name] 03/27/2021 1:15 PM ?Medical Record Number: [id number] ?Patient Account Number: [id number] ?Date of Birth/Sex: ?Treating RN: ?02-Mar-1945 (76 y.o. [name] [name] ?Primary Care Provider: Dorthy [name], [name] ?Other Clinician: ?Referring Provider: ?Treating Provider/Extender: [name] [name] ?[name] [name] ?Weeks in Treatment: 0 ?Verbal / Phone Orders: No ?Diagnosis Coding ?ICD-10 Coding ?Code Description ?L97.211 Non-pressure chronic ulcer of right calf limited to breakdown of skin ?J96.11 Chronic respiratory failure with hypoxia ?J44.9 Chronic obstructive  pulmonary disease, unspecified ?I68.03 Chronic diastolic (congestive) heart failure ?I87.309 Chronic venous hypertension (idiopathic) without complications of unspecified lower extremity ?I25.10 Atherosclerotic heart disease of native coronary artery without angina pectoris ?Follow-up Appointments ?ppointment in 1 week. - with Dr. Celine Ahr with [facility] ?Return A ?Bathing/ Shower/ Hygiene ?May shower with protection but do not get wound dressing(s) wet. - Can use cast protector bag: May get at [facility], [place] or [place] ?Edema Control - Lymphedema / SCD / Other ?Elevate legs to the level of the heart or above for 30 minutes daily and/or when sitting, a frequency of: - throughout the day ?Avoid standing for long periods of time. ?Patient to wear own compression stockings every day. - Left Leg ?Exercise regularly ?Wound Treatment ?Wound #1 - Lower Leg Wound Laterality: Right, Anterior ?Cleanser: Soap and Water 1 x Per Week ?Discharge Instructions: May shower and wash wound with dial antibacterial soap and water prior to dressing change. ?Cleanser: Wound Cleanser 1 x Per Week ?Discharge Instructions: Cleanse the wound with wound cleanser prior to applying a clean dressing using gauze sponges, not tissue or cotton balls. ?Peri-Wound Care: Triamcinolone 15 (g) 1 x Per Week ?Discharge Instructions: Use triamcinolone 15 (g) as directed ?Peri-Wound Care: Sween Lotion (Moisturizing lotion) 1 x Per Week ?Discharge Instructions: Apply moisturizing lotion as directed ?Topical: Gentamicin 1 x Per Week ?Discharge Instructions: thin layer to wound bed ?Prim Dressing: IODOFLEX 0.9% Cadexomer Iodine Pad 4x6 cm 1 x Per Week ?ary ?Discharge Instructions: Apply to wound bed as instructed ?Secondary Dressing: Zetuvit Plus 4x8 in 1 x Per Week ?Discharge Instructions: Apply over primary dressing as directed. ?Compression Wrap: FourPress (4 layer compression wrap) 1 x Per Week ?Discharge Instructions: Apply four layer compression as directed. May  also use Miliken CoFlex 2 layer compression system as ?alternative. ?Electronic Signature(s) ?Signed: 03/27/2021 3:36:11 PM By: [name] [name] ?Entered By: [name] [name] on 03/27/2021 14:01:

## 2021-03-27 NOTE — Progress Notes (Signed)
Rodman, Christion J. ([id number]) ?Visit Report for 03/27/2021 ?Arrival Information Details ?Patient Name: Date of Service: ?[name], [name] 03/27/2021 1:15 PM ?Medical Record Number: [id number] ?Patient Account Number: [id number] ?Date of Birth/Sex: Treating RN: ?12/27/1945 (76 y.o. [name] ?Primary Care Catheleen Langhorne: [name], [name]: ?Referring Kelton Bultman: ?Treating Ogechi Kuehnel/Extender: [name] ?[name], [name] ?Weeks in Treatment: 0 ?Visit Information History Since Last Visit ?Added or deleted any medications: No ?Patient Arrived: [name] [name] ?Any new allergies or adverse reactions: No ?Arrival Time: 13:30 ?Had a fall or experienced change in No ?Accompanied By: self ?activities of daily living that may affect ?Transfer Assistance: None ?risk of falls: ?Patient Identification Verified: Yes ?Signs or symptoms of abuse/neglect since last visito No ?Secondary Verification Process Completed: Yes ?Hospitalized since last visit: No ?Patient Requires Transmission-Based Precautions: No ?Implantable device outside of the clinic excluding No ?Patient Has Alerts: No ?cellular tissue based products placed in the center ?since last visit: ?Has Dressing in Place as Prescribed: Yes ?Has Compression in Place as Prescribed: Yes ?Pain Present Now: No ?Electronic Signature(s) ?Signed: 03/27/2021 6:11:59 PM By: Baruch Gouty RN, BSN ?Entered By: Baruch Gouty on 03/27/2021 13:32:09 ?-------------------------------------------------------------------------------- ?Compression Therapy Details ?Patient Name: Date of Service: ?[name], [name] 03/27/2021 1:15 PM ?Medical Record Number: [id number] ?Patient Account Number: [id number] ?Date of Birth/Sex: Treating RN: ?[birth year] (76 y.o. [name] ?Primary Care Mubashir Mallek: [name], [name]: ?Referring Brendin Situ: ?Treating Estreya Clay/Extender: [name] ?[name], [name] ?Weeks in Treatment: 0 ?Compression Therapy Performed for Wound  Assessment: Wound #1 Right,Anterior Lower Leg ?Performed By: Clinician Baruch Gouty, RN ?Compression Type: Four Layer ?Post Procedure Diagnosis ?Same as Pre-procedure ?Electronic Signature(s) ?Signed: 03/27/2021 6:11:59 PM By: Baruch Gouty RN, BSN ?Entered By: Baruch Gouty on 03/27/2021 13:54:13 ?-------------------------------------------------------------------------------- ?Encounter Discharge Information Details ?Patient Name: ?Date of Service: ?[name], [name] 03/27/2021 1:15 PM ?Medical Record Number: [id number] ?Patient Account Number: [id number] ?Date of Birth/Sex: ?Treating RN: ?07-20-1945 (76 y.o. [name] ?Primary Care Moneisha Vosler: [name], [name] ?Other Clinician: ?Referring Krishiv Sandler: ?Treating Evangelos Paulino/Extender: [name] ?[name], [name] ?Weeks in Treatment: 0 ?Encounter Discharge Information Items Post Procedure Vitals ?Discharge Condition: Stable ?Temperature (F): 98.4 ?Ambulatory Status: [name] [name] ?Pulse (bpm): 108 ?Discharge Destination: Home ?Respiratory Rate (breaths/min): 22 ?Transportation: Private Auto ?Blood Pressure (mmHg): 135/66 ?Accompanied By: self ?Schedule Follow-up Appointment: Yes ?Clinical Summary of Care: Patient Declined ?Electronic Signature(s) ?Signed: 03/27/2021 6:11:59 PM By: Baruch Gouty RN, BSN ?Entered By: Baruch Gouty on 03/27/2021 14:13:07 ?-------------------------------------------------------------------------------- ?Lower Extremity Assessment Details ?Patient Name: ?Date of Service: ?[name], [name] 03/27/2021 1:15 PM ?Medical Record Number: [id number] ?Patient Account Number: [id number] ?Date of Birth/Sex: ?Treating RN: ?Sep 11, 1945 (77 y.o. [name] ?Primary Care Greenley Martone: [name], [name] ?Other Clinician: ?Referring Shinichi Anguiano: ?Treating Choice Kleinsasser/Extender: [name] ?[name], [name] ?Weeks in Treatment: 0 ?Edema Assessment ?Assessed: [Left: No] [Right: No] ?E[Left: dema] [Right: :] ?Calf ?Left: Right: ?Point of Measurement:  31 cm From Medial Instep 42.5 cm ?Ankle ?Left: Right: ?Point of Measurement: 13 cm From Medial Instep 24.3 cm ?Vascular Assessment ?Pulses: ?Dorsalis Pedis ?Palpable: [Right:No] ?Electronic Signature(s) ?Signed: 03/27/2021 6:11:59 PM By: Baruch Gouty RN, BSN ?Entered By: Baruch Gouty on 03/27/2021 13:42:23 ?-------------------------------------------------------------------------------- ?Multi Wound Chart Details ?Patient Name: ?Date of Service: ?[name], [name] 03/27/2021 1:15 PM ?Medical Record Number: [id number] ?Patient Account Number: [id number] ?Date of Birth/Sex: ?Treating RN: ?March 22, 1945 (76 y.o. [name] ?Primary Care Le Ferraz: [name], [name] ?Other Clinician: ?Referring Johntae Broxterman: ?Treating Alantis Bethune/Extender: [name] ?[name], [name] ?Weeks in Treatment: 0 ?Vital Signs ?Height(in): 71 ?Pulse(bpm): 108 ?Weight(lbs): 238 ?Blood Pressure(mmHg): 135/66 ?  Body Mass Index(BMI): 33.2 ?Temperature(??F): 98.4 ?Respiratory Rate(breaths/min): 24 ?Photos: [N/A:N/A] ?Right, Anterior Lower Leg N/A N/A ?Wound Location: ?Trauma N/A N/A ?Wounding Event: ?Trauma, Other N/A N/A ?Primary Etiology: ?Chronic Obstructive Pulmonary N/A N/A ?Comorbid History: ?Disease (COPD), Coronary Artery ?Disease, Hypertension, Neuropathy ?02/16/2021 N/A N/A ?Date Acquired: ?0 N/A N/A ?Weeks of Treatment: ?Open N/A N/A ?Wound Status: ?No N/A N/A ?Wound Recurrence: ?2x2.4x0.2 N/A N/A ?Measurements L x W x D (cm) ?3.77 N/A N/A ?A (cm?) : ?rea ?0.754 N/A N/A ?Volume (cm?) : ?12.10% N/A N/A ?% Reduction in A rea: ?-75.80% N/A N/A ?% Reduction in Volume: ?Full Thickness Without Exposed N/A N/A ?Classification: ?Support Structures ?Medium N/A N/A ?Exudate A mount: ?Serosanguineous N/A N/A ?Exudate Type: ?red, brown N/A N/A ?Exudate Color: ?Distinct, outline attached N/A N/A ?Wound Margin: ?Large (67-100%) N/A N/A ?Granulation A mount: ?Red N/A N/A ?Granulation Quality: ?Small (1-33%) N/A N/A ?Necrotic A mount: ?Fat Layer  (Subcutaneous Tissue): Yes N/A N/A ?Exposed Structures: ?Fascia: No ?Tendon: No ?Muscle: No ?Joint: No ?Bone: No ?None N/A N/A ?Epithelialization: ?Debridement - Excisional N/A N/A ?Debridement: ?Pre-procedure Verification/Time Out 13:50 N/A N/A ?Taken: ?Lidocaine 4% Topical Solution N/A N/A ?Pain Control: ?Subcutaneous, Slough N/A N/A ?Tissue Debrided: ?Skin/Subcutaneous Tissue N/A N/A ?Level: ?4.8 N/A N/A ?Debridement A (sq cm): ?rea ?Curette N/A N/A ?Instrument: ?Minimum N/A N/A ?Bleeding: ?Pressure N/A N/A ?Hemostasis A chieved: ?0 N/A N/A ?Procedural Pain: ?0 N/A N/A ?Post Procedural Pain: ?Procedure was tolerated well N/A N/A ?Debridement Treatment Response: ?2x2.4x0.2 N/A N/A ?Post Debridement Measurements L x ?W x D (cm) ?0.754 N/A N/A ?Post Debridement Volume: (cm?) ?Compression Therapy N/A N/A ?Procedures Performed: ?Debridement ?Treatment Notes ?Electronic Signature(s) ?Signed: 03/27/2021 1:58:53 PM By: [name] MD FACS ?Signed: 03/27/2021 6:11:59 PM By: Baruch Gouty RN, BSN ?Entered By: [name] on 03/27/2021 13:58:53 ?-------------------------------------------------------------------------------- ?Multi-Disciplinary Care Plan Details ?Patient Name: ?Date of Service: ?Mcmullan, [name] 03/27/2021 1:15 PM ?Medical Record Number: [id number] ?Patient Account Number: [id number] ?Date of Birth/Sex: ?Treating RN: ?[birth year] (76 y.o. [name] ?Primary Care Worley Radermacher: [name], [name] ?Other Clinician: ?Referring Allyanna Appleman: ?Treating Griff Badley/Extender: [name] ?[name], [name] ?Weeks in Treatment: 0 ?Multidisciplinary Care Plan reviewed with physician ?Active Inactive ?Wound/Skin Impairment ?Nursing Diagnoses: ?Impaired tissue integrity ?Goals: ?Patient/caregiver will verbalize understanding of skin care regimen ?Date Initiated: 03/21/2021 ?Target Resolution Date: 04/18/2021 ?Goal Status: Active ?Ulcer/skin breakdown will have a volume reduction of 30% by week 4 ?Date Initiated:  03/21/2021 ?Target Resolution Date: 04/18/2021 ?Goal Status: Active ?Interventions: ?Assess patient/caregiver ability to obtain necessary supplies ?Assess patient/caregiver ability to perform ulcer/skin care regi

## 2021-04-03 ENCOUNTER — Other Ambulatory Visit: Payer: Self-pay

## 2021-04-03 ENCOUNTER — Encounter (HOSPITAL_BASED_OUTPATIENT_CLINIC_OR_DEPARTMENT_OTHER): Payer: PPO | Admitting: General Surgery

## 2021-04-03 DIAGNOSIS — I5032 Chronic diastolic (congestive) heart failure: Secondary | ICD-10-CM | POA: Diagnosis not present

## 2021-04-03 DIAGNOSIS — I87301 Chronic venous hypertension (idiopathic) without complications of right lower extremity: Secondary | ICD-10-CM | POA: Diagnosis not present

## 2021-04-03 DIAGNOSIS — L97211 Non-pressure chronic ulcer of right calf limited to breakdown of skin: Secondary | ICD-10-CM | POA: Diagnosis not present

## 2021-04-03 DIAGNOSIS — L97812 Non-pressure chronic ulcer of other part of right lower leg with fat layer exposed: Secondary | ICD-10-CM | POA: Diagnosis not present

## 2021-04-03 DIAGNOSIS — J9611 Chronic respiratory failure with hypoxia: Secondary | ICD-10-CM | POA: Diagnosis not present

## 2021-04-03 NOTE — Progress Notes (Signed)
[name], [name] ([id number]) ?Visit Report for 04/03/2021 ?Chief Complaint Document Details ?Patient Name: Date of Service: ?[name], [name] 04/03/2021 1:15 PM ?Medical Record Number: [id number] ?Patient Account Number: [id number] ?Date of Birth/Sex: Treating RN: ?10/08/1945 (76 y.o. [name] [name] ?Primary Care Provider: Dorthy [name], [name] Other Clinician: ?Referring Provider: ?Treating Provider/Extender: [name] ?[name], [name] ?Weeks in Treatment: 1 ?Information Obtained from: Patient ?Chief Complaint ?Patient seen for complaints of Non-Healing Wound. ?Electronic Signature(s) ?Signed: 04/03/2021 1:46:25 PM By: [name] MD FACS ?Entered By: [name] on 04/03/2021 13:46:25 ?-------------------------------------------------------------------------------- ?Debridement Details ?Patient Name: Date of Service: ?[name], [name] 04/03/2021 1:15 PM ?Medical Record Number: [id number] ?Patient Account Number: [id number] ?Date of Birth/Sex: Treating RN: ?1945/01/18 (76 y.o. [name] [name] ?Primary Care Provider: Dorthy [name], [name] Other Clinician: ?Referring Provider: ?Treating Provider/Extender: [name] ?[name], [name] ?Weeks in Treatment: 1 ?Debridement Performed for Assessment: Wound #1 Right,Anterior Lower Leg ?Performed By: Physician [name], MD ?Debridement Type: Debridement ?Level of Consciousness (Pre-procedure): Awake and Alert ?Pre-procedure Verification/Time Out Yes - 13:35 ?Taken: ?Start Time: 13:38 ?Pain Control: Lidocaine 4% T opical Solution ?T Area Debrided (L x W): ?otal 0.9 (cm) x 1 (cm) = 0.9 (cm?) ?Tissue and other material debrided: Viable, Non-Viable, Slough, Subcutaneous, Slough ?Level: Skin/Subcutaneous Tissue ?Debridement Description: Excisional ?Instrument: Curette ?Bleeding: Minimum ?Hemostasis Achieved: Pressure ?Procedural Pain: 3 ?Post Procedural Pain: 0 ?Response to Treatment: Procedure was tolerated well ?Level of Consciousness (Post- Awake  and Alert ?procedure): ?Post Debridement Measurements of Total Wound ?Length: (cm) 1.6 ?Width: (cm) 2.3 ?Depth: (cm) 0.1 ?Volume: (cm?) 0.289 ?Character of Wound/Ulcer Post Debridement: Improved ?Post Procedure Diagnosis ?Same as Pre-procedure ?Electronic Signature(s) ?Signed: 04/03/2021 2:36:24 PM By: [name] MD FACS ?Signed: 04/03/2021 4:47:17 PM By: Baruch Gouty RN, BSN ?Entered By: Baruch Gouty on 04/03/2021 13:42:35 ?-------------------------------------------------------------------------------- ?HPI Details ?Patient Name: Date of Service: ?[name], [name] 04/03/2021 1:15 PM ?Medical Record Number: [id number] ?Patient Account Number: [id number] ?Date of Birth/Sex: Treating RN: ?[birth year] (76 y.o. [name] [name] ?Primary Care Provider: Dorthy [name], [name] Other Clinician: ?Referring Provider: ?Treating Provider/Extender: [name] ?[name], [name] ?Weeks in Treatment: 1 ?History of Present Illness ?HPI Description: ADMISSION ?03/21/21 ?This is a 76 year old man with a past medical history notable for COPD and chronic hypoxic respiratory failure, requiring 4 L of oxygen, coronary artery ?disease, hypertension, and bilateral lower extremity chronic edema. On February 9, he struck his right leg with a car door which opened up a wound. It has ?failed to heal since that time. He saw a dermatologist that prescribed MetroGel, with little improvement. He was placed on Keflex and has a few days left of ?this antibiotic. He does wear support stockings on a regular basis, but the compression is fairly low. ABI in clinic today was 1.29. The wound itself has thick ?yellow slough, but no surrounding erythema or induration. No foul odor. He does not have significant pain. ?03/27/2021: Last week, I took a PCR culture which was positive for Enterococcus faecalis. I initiated him on Augmentin and he is tolerating this well. Today, the ?wound is much cleaner and is coming in size. He did not love the 4-layer  compression, but he is willing to tolerate it to get the wound closed. He has been in ?Iodoflex under the compression wrap. ?04/03/2021: Today, he is complaining fairly bitterly about his compression wraps. He says that they are too tight and he feels like his toes go numb from time to ?time. However, the wound is smaller and has significantly less slough. We have been  using Iodoflex under the compression wrap. ?Electronic Signature(s) ?Signed: 04/03/2021 1:47:21 PM By: [name] MD FACS ?Entered By: [name] on 04/03/2021 13:47:21 ?-------------------------------------------------------------------------------- ?Physical Exam Details ?Patient Name: Date of Service: ?[name], [name] 04/03/2021 1:15 PM ?Medical Record Number: [id number] ?Patient Account Number: [id number] ?Date of Birth/Sex: Treating RN: ?[birth year] (76 y.o. [name] [name] ?Primary Care Provider: Dorthy [name], [name] Other Clinician: ?Referring Provider: ?Treating Provider/Extender: [name] ?[name], [name] ?Weeks in Treatment: 1 ?Constitutional ?. . . . No acute distress. ?Respiratory ?Normal work of breathing on supplemental oxygen. ?Notes ?04/03/2021: Wound examon the right lateral calf, the wound is almost flush with the skin, with good granulation tissue. Minimal slough. ?Electronic Signature(s) ?Signed: 04/03/2021 1:50:51 PM By: [name] MD FACS ?Entered By: [name] on 04/03/2021 13:50:50 ?-------------------------------------------------------------------------------- ?Physician Orders Details ?Patient Name: ?Date of Service: ?[name], [name] 04/03/2021 1:15 PM ?Medical Record Number: [id number] ?Patient Account Number: [id number] ?Date of Birth/Sex: ?Treating RN: ?16-Jan-1945 (76 y.o. [name] [name] ?Primary Care Provider: Dorthy [name], [name] ?Other Clinician: ?Referring Provider: ?Treating Provider/Extender: [name] ?[name], [name] ?Weeks in Treatment: 1 ?Verbal / Phone Orders: No ?Diagnosis  Coding ?ICD-10 Coding ?Code Description ?L97.211 Non-pressure chronic ulcer of right calf limited to breakdown of skin ?J96.11 Chronic respiratory failure with hypoxia ?J44.9 Chronic obstructive pulmonary disease, unspecified ?G99.24 Chronic diastolic (congestive) heart failure ?I87.309 Chronic venous hypertension (idiopathic) without complications of unspecified lower extremity ?I25.10 Atherosclerotic heart disease of native coronary artery without angina pectoris ?Follow-up Appointments ?ppointment in 1 week. - with Dr. Celine Ahr with [facility] ?Return A ?Bathing/ Shower/ Hygiene ?May shower with protection but do not get wound dressing(s) wet. - Can use cast protector bag: May get at [facility] [place], [place] or [place] ?Edema Control - Lymphedema / SCD / Other ?Elevate legs to the level of the heart or above for 30 minutes daily and/or when sitting, a frequency of: - throughout the day ?Avoid standing for long periods of time. ?Patient to wear own compression stockings every day. - Left Leg ?Exercise regularly ?Wound Treatment ?Wound #1 - Lower Leg Wound Laterality: Right, Anterior ?Cleanser: Soap and Water 1 x Per Week ?Discharge Instructions: May shower and wash wound with dial antibacterial soap and water prior to dressing change. ?Cleanser: Wound Cleanser 1 x Per Week ?Discharge Instructions: Cleanse the wound with wound cleanser prior to applying a clean dressing using gauze sponges, not tissue or cotton balls. ?Peri-Wound Care: Triamcinolone 15 (g) 1 x Per Week ?Discharge Instructions: Use triamcinolone 15 (g) as directed ?Peri-Wound Care: Sween Lotion (Moisturizing lotion) 1 x Per Week ?Discharge Instructions: Apply moisturizing lotion as directed ?Prim Dressing: IODOFLEX 0.9% Cadexomer Iodine Pad 4x6 cm 1 x Per Week ?ary ?Discharge Instructions: Apply to wound bed as instructed ?Secondary Dressing: Woven Gauze Sponge, Non-Sterile 4x4 in 1 x Per Week ?Discharge Instructions: Apply over primary dressing as  directed. ?Compression Wrap: CoFlex TLC XL 2-layer Compression System 4x7 (in/yd) 1 x Per Week ?Discharge Instructions: Apply CoFlex 2-layer compression as directed. (alt for 4 layer) ?Electronic Signature(s) ?Signed:

## 2021-04-03 NOTE — Progress Notes (Signed)
[name], [name] ([id number]) ?Visit Report for 04/03/2021 ?Arrival Information Details ?Patient Name: Date of Service: ?[name], [name] 04/03/2021 1:15 PM ?Medical Record Number: [id number] ?Patient Account Number: [id number] ?Date of Birth/Sex: Treating RN: ?04-13-1945 (76 y.o. [name] ?Primary Care Rogelio Waynick: [name], [name]: ?Referring Tacey Dimaggio: ?Treating Karrina Lye/Extender: [name] ?[name], [name] ?Weeks in Treatment: 1 ?Visit Information History Since Last Visit ?Added or deleted any medications: No ?Patient Arrived: [name] ?Any new allergies or adverse reactions: No ?Arrival Time: 13:09 ?Had a fall or experienced change in No ?Accompanied By: self ?activities of daily living that may affect ?Transfer Assistance: None ?risk of falls: ?Patient Identification Verified: Yes ?Signs or symptoms of abuse/neglect since last visito No ?Secondary Verification Process Completed: Yes ?Hospitalized since last visit: No ?Patient Requires Transmission-Based Precautions: No ?Implantable device outside of the clinic excluding No ?Patient Has Alerts: No ?cellular tissue based products placed in the center ?since last visit: ?Has Dressing in Place as Prescribed: Yes ?Has Compression in Place as Prescribed: Yes ?Pain Present Now: No ?Electronic Signature(s) ?Signed: 04/03/2021 4:47:17 PM By: Baruch Gouty RN, BSN ?Entered By: Baruch Gouty on 04/03/2021 13:12:39 ?-------------------------------------------------------------------------------- ?Compression Therapy Details ?Patient Name: Date of Service: ?[name], [name] 04/03/2021 1:15 PM ?Medical Record Number: [id number] ?Patient Account Number: [id number] ?Date of Birth/Sex: Treating RN: ?07-16-1945 (76 y.o. [name] ?Primary Care Shakena Callari: [name], [name]: ?Referring Jehan Ranganathan: ?Treating Macenzie Burford/Extender: [name] ?[name], [name] ?Weeks in Treatment: 1 ?Compression Therapy Performed for Wound  Assessment: Wound #1 Right,Anterior Lower Leg ?Performed By: Clinician Baruch Gouty, RN ?Compression Type: Double Layer ?Post Procedure Diagnosis ?Same as Pre-procedure ?Electronic Signature(s) ?Signed: 04/03/2021 4:47:17 PM By: Baruch Gouty RN, BSN ?Entered By: Baruch Gouty on 04/03/2021 13:43:18 ?-------------------------------------------------------------------------------- ?Encounter Discharge Information Details ?Patient Name: ?Date of Service: ?[name], [name] 04/03/2021 1:15 PM ?Medical Record Number: [id number] ?Patient Account Number: [id number] ?Date of Birth/Sex: ?Treating RN: ?April 01, 1945 (76 y.o. [name] ?Primary Care Lavaun Greenfield: [name], [name] ?Other Clinician: ?Referring Merri Dimaano: ?Treating Galo Sayed/Extender: [name] ?[name], [name] ?Weeks in Treatment: 1 ?Encounter Discharge Information Items Post Procedure Vitals ?Discharge Condition: Stable ?Temperature (F): 98.7 ?Ambulatory Status: [name] ?Pulse (bpm): 93 ?Discharge Destination: Home ?Respiratory Rate (breaths/min): 18 ?Transportation: Private Auto ?Blood Pressure (mmHg): 122/65 ?Accompanied By: self ?Schedule Follow-up Appointment: Yes ?Clinical Summary of Care: Patient Declined ?Electronic Signature(s) ?Signed: 04/03/2021 4:47:17 PM By: Baruch Gouty RN, BSN ?Entered By: Baruch Gouty on 04/03/2021 13:57:22 ?-------------------------------------------------------------------------------- ?Lower Extremity Assessment Details ?Patient Name: ?Date of Service: ?[name], [name] 04/03/2021 1:15 PM ?Medical Record Number: [id number] ?Patient Account Number: [id number] ?Date of Birth/Sex: ?Treating RN: ?[birth year] (76 y.o. [name] ?Primary Care Demetreus Lothamer: [name], [name] ?Other Clinician: ?Referring Lucillie Kiesel: ?Treating Zuma Hust/Extender: [name] ?[name], [name] ?Weeks in Treatment: 1 ?Edema Assessment ?Assessed: [Left: No] [Right: No] ?Edema: [Left: Ye] [Right: s] ?Calf ?Left: Right: ?Point of  Measurement: 31 cm From Medial Instep 41 cm ?Ankle ?Left: Right: ?Point of Measurement: 13 cm From Medial Instep 23.5 cm ?Vascular Assessment ?Pulses: ?Dorsalis Pedis ?Palpable: [Right:Yes] ?Electronic Signature(s) ?Signed: 04/03/2021 4:47:17 PM By: Baruch Gouty RN, BSN ?Entered By: Baruch Gouty on 04/03/2021 13:21:58 ?-------------------------------------------------------------------------------- ?Multi Wound Chart Details ?Patient Name: ?Date of Service: ?[name], [name] 04/03/2021 1:15 PM ?Medical Record Number: [id number] ?Patient Account Number: [id number] ?Date of Birth/Sex: ?Treating RN: ?[birth year] (76 y.o. [name] ?Primary Care Erika Slaby: [name], [name] ?Other Clinician: ?Referring Shakevia Sarris: ?Treating Elleana Stillson/Extender: [name] ?[name], [name] ?Weeks in Treatment: 1 ?Vital Signs ?Height(in): 71 ?Pulse(bpm): 93 ?Weight(lbs): 238 ?Blood Pressure(mmHg):  122/65 ?Body Mass Index(BMI): 33.2 ?Temperature(??F): 98.7 ?Respiratory Rate(breaths/min): 22 ?Photos: [N/A:N/A] ?Right, Anterior Lower Leg N/A N/A ?Wound Location: ?Trauma N/A N/A ?Wounding Event: ?Trauma, Other N/A N/A ?Primary Etiology: ?Chronic Obstructive Pulmonary N/A N/A ?Comorbid History: ?Disease (COPD), Coronary Artery ?Disease, Hypertension, Neuropathy ?02/16/2021 N/A N/A ?Date Acquired: ?1 N/A N/A ?Weeks of Treatment: ?Open N/A N/A ?Wound Status: ?No N/A N/A ?Wound Recurrence: ?1.6x2.3x0.1 N/A N/A ?Measurements L x W x D (cm) ?2.89 N/A N/A ?A (cm?) : ?rea ?0.289 N/A N/A ?Volume (cm?) : ?32.60% N/A N/A ?% Reduction in A rea: ?32.60% N/A N/A ?% Reduction in Volume: ?Full Thickness Without Exposed N/A N/A ?Classification: ?Support Structures ?Medium N/A N/A ?Exudate A mount: ?Serosanguineous N/A N/A ?Exudate Type: ?red, brown N/A N/A ?Exudate Color: ?Distinct, outline attached N/A N/A ?Wound Margin: ?Large (67-100%) N/A N/A ?Granulation A mount: ?Red N/A N/A ?Granulation Quality: ?Small (1-33%) N/A N/A ?Necrotic A mount: ?Fat  Layer (Subcutaneous Tissue): Yes N/A N/A ?Exposed Structures: ?Fascia: No ?Tendon: No ?Muscle: No ?Joint: No ?Bone: No ?Small (1-33%) N/A N/A ?Epithelialization: ?Debridement - Excisional N/A N/A ?Debridement: ?Pre-procedure Verification/Time Out 13:35 N/A N/A ?Taken: ?Lidocaine 4% Topical Solution N/A N/A ?Pain Control: ?Subcutaneous, Slough N/A N/A ?Tissue Debrided: ?Skin/Subcutaneous Tissue N/A N/A ?Level: ?0.9 N/A N/A ?Debridement A (sq cm): ?rea ?Curette N/A N/A ?Instrument: ?Minimum N/A N/A ?Bleeding: ?Pressure N/A N/A ?Hemostasis A chieved: ?3 N/A N/A ?Procedural Pain: ?0 N/A N/A ?Post Procedural Pain: ?Procedure was tolerated well N/A N/A ?Debridement Treatment Response: ?1.6x2.3x0.1 N/A N/A ?Post Debridement Measurements L x ?W x D (cm) ?0.289 N/A N/A ?Post Debridement Volume: (cm?) ?Compression Therapy N/A N/A ?Procedures Performed: ?Debridement ?Treatment Notes ?Electronic Signature(s) ?Signed: 04/03/2021 1:46:17 PM By: [name] MD FACS ?Signed: 04/03/2021 4:47:17 PM By: Baruch Gouty RN, BSN ?Entered By: [name] on 04/03/2021 13:46:17 ?-------------------------------------------------------------------------------- ?Multi-Disciplinary Care Plan Details ?Patient Name: ?Date of Service: ?[name], [name] 04/03/2021 1:15 PM ?Medical Record Number: [id number] ?Patient Account Number: [id number] ?Date of Birth/Sex: ?Treating RN: ?April 15, 1945 (76 y.o. [name] ?Primary Care Jannette Cotham: [name], [name] ?Other Clinician: ?Referring Barbar Brede: ?Treating Jamauri Kruzel/Extender: [name] ?[name], [name] ?Weeks in Treatment: 1 ?Multidisciplinary Care Plan reviewed with physician ?Active Inactive ?Wound/Skin Impairment ?Nursing Diagnoses: ?Impaired tissue integrity ?Goals: ?Patient/caregiver will verbalize understanding of skin care regimen ?Date Initiated: 03/21/2021 ?Target Resolution Date: 04/18/2021 ?Goal Status: Active ?Ulcer/skin breakdown will have a volume reduction of 30% by week  4 ?Date Initiated: 03/21/2021 ?Target Resolution Date: 04/18/2021 ?Goal Status: Active ?Interventions: ?Assess patient/caregiver ability to obtain necessary supplies ?Assess patient/caregiver ability to perform ulcer

## 2021-04-05 ENCOUNTER — Telehealth: Payer: Self-pay | Admitting: Pulmonary Disease

## 2021-04-05 MED ORDER — BREZTRI AEROSPHERE 160-9-4.8 MCG/ACT IN AERO: 2.0000 | INHALATION_SPRAY | Freq: Two times a day (BID) | RESPIRATORY_TRACT | 11 refills | Status: DC

## 2021-04-05 NOTE — Telephone Encounter (Signed)
Rx for pt's [name] [name] has been printed and signed by Dr. [name] to have faxed to [facility] for pt. Called and spoke with pt letting him know this had been done and he verbalized understanding. Nothing further needed. ?

## 2021-04-10 ENCOUNTER — Encounter (HOSPITAL_BASED_OUTPATIENT_CLINIC_OR_DEPARTMENT_OTHER): Payer: PPO | Attending: General Surgery | Admitting: General Surgery

## 2021-04-10 DIAGNOSIS — I5032 Chronic diastolic (congestive) heart failure: Secondary | ICD-10-CM | POA: Insufficient documentation

## 2021-04-10 DIAGNOSIS — I2510 Atherosclerotic heart disease of native coronary artery without angina pectoris: Secondary | ICD-10-CM | POA: Diagnosis not present

## 2021-04-10 DIAGNOSIS — L97211 Non-pressure chronic ulcer of right calf limited to breakdown of skin: Secondary | ICD-10-CM | POA: Diagnosis not present

## 2021-04-10 DIAGNOSIS — J9611 Chronic respiratory failure with hypoxia: Secondary | ICD-10-CM | POA: Insufficient documentation

## 2021-04-10 DIAGNOSIS — J449 Chronic obstructive pulmonary disease, unspecified: Secondary | ICD-10-CM | POA: Insufficient documentation

## 2021-04-10 DIAGNOSIS — I110 Hypertensive heart disease with heart failure: Secondary | ICD-10-CM | POA: Insufficient documentation

## 2021-04-10 DIAGNOSIS — I87311 Chronic venous hypertension (idiopathic) with ulcer of right lower extremity: Secondary | ICD-10-CM | POA: Insufficient documentation

## 2021-04-10 DIAGNOSIS — L97812 Non-pressure chronic ulcer of other part of right lower leg with fat layer exposed: Secondary | ICD-10-CM | POA: Diagnosis not present

## 2021-04-10 NOTE — Progress Notes (Signed)
Rikard, Kentravious J. ([id number]) ?Visit Report for 04/10/2021 ?Chief Complaint Document Details ?Patient Name: Date of Service: ?[place]enden, [name] 04/10/2021 1:15 PM ?Medical Record Number: [id number] ?Patient Account Number: [id number] ?Date of Birth/Sex: Treating RN: ?March 03, 1945 (76 y.o. [name] ?Primary Care Provider: [name], [name]: ?Referring Provider: ?Treating Provider/Extender: [name] ?[name], [name] ?Weeks in Treatment: 2 ?Information Obtained from: Patient ?Chief Complaint ?Patient seen for complaints of Non-Healing Wound. ?Electronic Signature(s) ?Signed: 04/10/2021 1:56:46 PM By: [name] MD FACS ?Entered By: [name] on 04/10/2021 13:56:46 ?-------------------------------------------------------------------------------- ?Debridement Details ?Patient Name: Date of Service: ?Stanzione, [name] 04/10/2021 1:15 PM ?Medical Record Number: [id number] ?Patient Account Number: [id number] ?Date of Birth/Sex: Treating RN: ?1945/10/21 (76 y.o. [name] ?Primary Care Provider: [name], [name]: ?Referring Provider: ?Treating Provider/Extender: [name] ?[name], [name] ?Weeks in Treatment: 2 ?Debridement Performed for Assessment: Wound #1 Right,Anterior Lower Leg ?Performed By: Physician [name], MD ?Debridement Type: Debridement ?Level of Consciousness (Pre-procedure): Awake and Alert ?Pre-procedure Verification/Time Out Yes - 13:38 ?Taken: ?Pain Control: Lidocaine 4% T opical Solution ?T Area Debrided (L x W): ?otal 0.8 (cm) x 1.7 (cm) = 1.36 (cm?) ?Tissue and other material debrided: Viable, Non-Viable, Slough, Subcutaneous, Fibrin/Exudate, Slough ?Level: Skin/Subcutaneous Tissue ?Debridement Description: Excisional ?Instrument: Curette ?Bleeding: Minimum ?Hemostasis Achieved: Pressure ?Procedural Pain: 0 ?Post Procedural Pain: 0 ?Response to Treatment: Procedure was tolerated well ?Level of Consciousness (Post- Awake and  Alert ?procedure): ?Post Debridement Measurements of Total Wound ?Length: (cm) 0.8 ?Width: (cm) 1.7 ?Depth: (cm) 0.1 ?Volume: (cm?) 0.107 ?Character of Wound/Ulcer Post Debridement: Improved ?Post Procedure Diagnosis ?Same as Pre-procedure ?Electronic Signature(s) ?Signed: 04/10/2021 3:25:11 PM By: [name] MD FACS ?Signed: 04/10/2021 5:48:07 PM By: Baruch Gouty RN, BSN ?Entered By: Baruch Gouty on 04/10/2021 13:39:40 ?-------------------------------------------------------------------------------- ?HPI Details ?Patient Name: Date of Service: ?Ovando, [name] 04/10/2021 1:15 PM ?Medical Record Number: [id number] ?Patient Account Number: [id number] ?Date of Birth/Sex: Treating RN: ?29-Jul-1945 (76 y.o. [name] ?Primary Care Provider: [name], [name]: ?Referring Provider: ?Treating Provider/Extender: [name] ?[name], [name] ?Weeks in Treatment: 2 ?History of Present Illness ?HPI Description: ADMISSION ?03/21/21 ?This is a 76 year old man with a past medical history notable for COPD and chronic hypoxic respiratory failure, requiring 4 L of oxygen, coronary artery ?disease, hypertension, and bilateral lower extremity chronic edema. On February 9, he struck his right leg with a car door which opened up a wound. It has ?failed to heal since that time. He saw a dermatologist that prescribed MetroGel, with little improvement. He was placed on Keflex and has a few days left of ?this antibiotic. He does wear support stockings on a regular basis, but the compression is fairly low. ABI in clinic today was 1.29. The wound itself has thick ?yellow slough, but no surrounding erythema or induration. No foul odor. He does not have significant pain. ?03/27/2021: Last week, I took a PCR culture which was positive for Enterococcus faecalis. I initiated him on Augmentin and he is tolerating this well. Today, the ?wound is much cleaner and is coming in size. He did not love the 4-layer  compression, but he is willing to tolerate it to get the wound closed. He has been in ?Iodoflex under the compression wrap. ?04/03/2021: Today, he is complaining fairly bitterly about his compression wraps. He says that they are too tight and he feels like his toes go numb from time to ?time. However, the wound is smaller and has significantly less slough. We have been using Iodoflex  under the compression wrap. ?04/10/2021: Last week, we changed his dressing to Coflex. He says that although he really does not like the compression wraps, this 1 was more tolerable. The ?wound is much smaller with good epithelial skin coming around the periphery and is very clean. We have been using Iodoflex under the Coflex compression. ?Electronic Signature(s) ?Signed: 04/10/2021 1:57:33 PM By: [name] MD FACS ?Entered By: [name] on 04/10/2021 13:57:32 ?-------------------------------------------------------------------------------- ?Physical Exam Details ?Patient Name: Date of Service: ?Reisz, [name] 04/10/2021 1:15 PM ?Medical Record Number: [id number] ?Patient Account Number: [id number] ?Date of Birth/Sex: Treating RN: ?1945-03-07 (76 y.o. [name] ?Primary Care Provider: [name], [name]: ?Referring Provider: ?Treating Provider/Extender: [name] ?[name], [name] ?Weeks in Treatment: 2 ?Constitutional ?He is hypertensive, but asymptomatic.. Tachycardic, asymptomatic.. . . No acute distress. ?Respiratory ?Normal work of breathing on supplemental oxygen. ?Notes ?04/10/2021: The wound is quite a bit smaller this week with a clean surface; minimal slough. Good edema control. ?Electronic Signature(s) ?Signed: 04/10/2021 1:58:35 PM By: [name] MD FACS ?Entered By: [name] on 04/10/2021 13:58:35 ?-------------------------------------------------------------------------------- ?Physician Orders Details ?Patient Name: ?Date of Service: ?Daris, [name] 04/10/2021 1:15  PM ?Medical Record Number: [id number] ?Patient Account Number: [id number] ?Date of Birth/Sex: ?Treating RN: ?1945/12/13 (76 y.o. [name] ?Primary Care Provider: [name], [name] ?Other Clinician: ?Referring Provider: ?Treating Provider/Extender: [name] ?[name], [name] ?Weeks in Treatment: 2 ?Verbal / Phone Orders: No ?Diagnosis Coding ?ICD-10 Coding ?Code Description ?L97.211 Non-pressure chronic ulcer of right calf limited to breakdown of skin ?J96.11 Chronic respiratory failure with hypoxia ?J44.9 Chronic obstructive pulmonary disease, unspecified ?G53.64 Chronic diastolic (congestive) heart failure ?I87.309 Chronic venous hypertension (idiopathic) without complications of unspecified lower extremity ?I25.10 Atherosclerotic heart disease of native coronary artery without angina pectoris ?Follow-up Appointments ?ppointment in 1 week. - with Dr. Celine Ahr with [facility] ?Return A ?Bathing/ Shower/ Hygiene ?May shower with protection but do not get wound dressing(s) wet. - Can use cast protector bag: May get at [facility], [place] or [place] ?Edema Control - Lymphedema / SCD / Other ?Elevate legs to the level of the heart or above for 30 minutes daily and/or when sitting, a frequency of: - throughout the day ?Avoid standing for long periods of time. ?Patient to wear own compression stockings every day. - Left Leg ?Exercise regularly ?Wound Treatment ?Wound #1 - Lower Leg Wound Laterality: Right, Anterior ?Cleanser: Soap and Water 1 x Per Week ?Discharge Instructions: May shower and wash wound with dial antibacterial soap and water prior to dressing change. ?Cleanser: Wound Cleanser 1 x Per Week ?Discharge Instructions: Cleanse the wound with wound cleanser prior to applying a clean dressing using gauze sponges, not tissue or cotton balls. ?Peri-Wound Care: Triamcinolone 15 (g) 1 x Per Week ?Discharge Instructions: Use triamcinolone 15 (g) as directed ?Peri-Wound Care: Sween Lotion (Moisturizing lotion) 1 x Per  Week ?Discharge Instructions: Apply moisturizing lotion as directed ?Prim Dressing: Promogran Prisma Matrix, 4.34 (sq in) (silver collagen) 1 x Per Week ?ary ?Discharge Instructions: Moisten collagen with salin

## 2021-04-10 NOTE — Progress Notes (Signed)
Lux, Bartow J. ([id number]) ?Visit Report for 04/10/2021 ?Arrival Information Details ?Patient Name: Date of Service: ?[name] [name], [name] 04/10/2021 1:15 PM ?Medical Record Number: [id number] ?Patient Account Number: [id number] ?Date of Birth/Sex: Treating RN: ?09-Dec-1945 (76 y.o. [name] [name] ?Primary Care Jo Booze: [name], [name]: ?Referring Shaheim Mahar: ?Treating Curt Oatis/Extender: [name] ?[name], [name] ?Weeks in Treatment: 2 ?Visit Information History Since Last Visit ?Added or deleted any medications: No ?Patient Arrived: [name] [name] ?Any new allergies or adverse reactions: No ?Arrival Time: 13:05 ?Had a fall or experienced change in No ?Accompanied By: self ?activities of daily living that may affect ?Transfer Assistance: None ?risk of falls: ?Patient Identification Verified: Yes ?Signs or symptoms of abuse/neglect since last visito No ?Secondary Verification Process Completed: Yes ?Hospitalized since last visit: No ?Patient Requires Transmission-Based Precautions: No ?Implantable device outside of the clinic excluding No ?Patient Has Alerts: No ?cellular tissue based products placed in the center ?since last visit: ?Has Dressing in Place as Prescribed: Yes ?Has Compression in Place as Prescribed: Yes ?Pain Present Now: No ?Electronic Signature(s) ?Signed: 04/10/2021 4:05:12 PM By: Adline Peals ?Entered By: Adline Peals on 04/10/2021 13:06:34 ?-------------------------------------------------------------------------------- ?Compression Therapy Details ?Patient Name: Date of Service: ?[name] [name], [name] 04/10/2021 1:15 PM ?Medical Record Number: [id number] ?Patient Account Number: [id number] ?Date of Birth/Sex: Treating RN: ?[birth year] (76 y.o. [name] [name] ?Primary Care Era Parr: [name], [name]: ?Referring Kaleen Rochette: ?Treating Xara Paulding/Extender: [name] ?[name], [name] ?Weeks in Treatment: 2 ?Compression Therapy Performed for Wound Assessment:  Wound #1 Right,Anterior Lower Leg ?Performed By: Clinician Baruch Gouty, RN ?Compression Type: Double Layer ?Post Procedure Diagnosis ?Same as Pre-procedure ?Electronic Signature(s) ?Signed: 04/10/2021 5:48:07 PM By: Baruch Gouty RN, BSN ?Entered By: Baruch Gouty on 04/10/2021 13:40:02 ?-------------------------------------------------------------------------------- ?Lower Extremity Assessment Details ?Patient Name: ?Date of Service: ?[name] [name], [name] 04/10/2021 1:15 PM ?Medical Record Number: [id number] ?Patient Account Number: [id number] ?Date of Birth/Sex: ?Treating RN: ?May 24, 1945 (76 y.o. [name] [name] ?Primary Care Arnaldo Heffron: [name], [name] ?Other Clinician: ?Referring Karrina Lye: ?Treating Kenita Bines/Extender: [name] ?[name], [name] ?Weeks in Treatment: 2 ?Edema Assessment ?Assessed: [Left: No] [Right: No] ?Edema: [Left: Ye] [Right: s] ?Calf ?Left: Right: ?Point of Measurement: 31 cm From Medial Instep 39.2 cm ?Ankle ?Left: Right: ?Point of Measurement: 13 cm From Medial Instep 24.7 cm ?Vascular Assessment ?Pulses: ?Dorsalis Pedis ?Palpable: [Right:No] ?Electronic Signature(s) ?Signed: 04/10/2021 4:05:12 PM By: Adline Peals ?Signed: 04/10/2021 5:48:07 PM By: Baruch Gouty RN, BSN ?Entered By: Adline Peals on 04/10/2021 13:21:38 ?-------------------------------------------------------------------------------- ?Multi Wound Chart Details ?Patient Name: ?Date of Service: ?[name] [name], [name] 04/10/2021 1:15 PM ?Medical Record Number: [id number] ?Patient Account Number: [id number] ?Date of Birth/Sex: ?Treating RN: ?July 13, 1945 (76 y.o. [name] [name] ?Primary Care Nevada Kirchner: [name], [name] ?Other Clinician: ?Referring Axton Cihlar: ?Treating Treyten Monestime/Extender: [name] ?[name], [name] ?Weeks in Treatment: 2 ?Vital Signs ?Height(in): 71 ?Pulse(bpm): 114 ?Weight(lbs): 238 ?Blood Pressure(mmHg): 160/80 ?Body Mass Index(BMI): 33.2 ?Temperature(??F): 98.5 ?Respiratory  Rate(breaths/min): 22 ?Photos: [1:Right, Anterior Lower Leg] [N/A:N/A N/A] ?Wound Location: [1:Trauma] [N/A:N/A] ?Wounding Event: [1:Trauma, Other] [N/A:N/A] ?Primary Etiology: [1:Chronic Obstructive Pulmonary] [N/A:N/A] ?Comorbid History: [1:Disease (COPD), Coronary Artery Disease, Hypertension, Neuropathy 02/16/2021] [N/A:N/A] ?Date Acquired: [1:2] [N/A:N/A] ?Weeks of Treatment: [1:Open] [N/A:N/A] ?Wound Status: [1:No] [N/A:N/A] ?Wound Recurrence: [1:0.8x1.7x0.1] [N/A:N/A] ?Measurements L x W x D (cm) [1:1.068] [N/A:N/A] ?A (cm?) : ?rea [1:0.107] [N/A:N/A] ?Volume (cm?) : [1:75.10%] [N/A:N/A] ?% Reduction in A [1:rea: 75.10%] [N/A:N/A] ?% Reduction in Volume: [1:Full Thickness Without Exposed] [N/A:N/A] ?Classification: [1:Support Structures Medium] [N/A:N/A] ?Exudate A mount: [1:Serosanguineous] [N/A:N/A] ?Exudate Type: [1:red, brown] [N/A:N/A] ?Exudate Color: [1:Distinct,  outline attached] [N/A:N/A] ?Wound Margin: [1:Large (67-100%)] [N/A:N/A] ?Granulation A mount: [1:Red] [N/A:N/A] ?Granulation Quality: [1:Small (1-33%)] [N/A:N/A] ?Necrotic A mount: ?[1:Fat Layer (Subcutaneous Tissue): Yes N/A] ?Exposed Structures: ?[1:Fascia: No Tendon: No Muscle: No Joint: No Bone: No Medium (34-66%)] [N/A:N/A] ?Epithelialization: [1:Debridement - Excisional] [N/A:N/A] ?Debridement: ?Pre-procedure Verification/Time Out 13:38 [N/A:N/A] ?Taken: [1:Lidocaine 4% Topical Solution] [N/A:N/A] ?Pain Control: [1:Subcutaneous, Slough] [N/A:N/A] ?Tissue Debrided: [1:Skin/Subcutaneous Tissue] [N/A:N/A] ?Level: [1:1.36] [N/A:N/A] ?Debridement A (sq cm): [1:rea Curette] [N/A:N/A] ?Instrument: [1:Minimum] [N/A:N/A] ?Bleeding: [1:Pressure] [N/A:N/A] ?Hemostasis A chieved: [1:0] [N/A:N/A] ?Procedural Pain: [1:0] [N/A:N/A] ?Post Procedural Pain: [1:Procedure was tolerated well] [N/A:N/A] ?Debridement Treatment Response: [1:0.8x1.7x0.1] [N/A:N/A] ?Post Debridement Measurements L x ?W x D (cm) [1:0.107] [N/A:N/A] ?Post Debridement Volume: (cm?)  [1:Compression Therapy] [N/A:N/A] ?Procedures Performed: [1:Debridement] ?Treatment Notes ?Wound #1 (Lower Leg) Wound Laterality: Right, Anterior ?Cleanser ?Soap and Water ?Discharge Instruction: May shower and wash wound with dial antibacterial soap and water prior to dressing change. ?Wound Cleanser ?Discharge Instruction: Cleanse the wound with wound cleanser prior to applying a clean dressing using gauze sponges, not tissue or cotton balls. ?Peri-Wound Care ?Triamcinolone 15 (g) ?Discharge Instruction: Use triamcinolone 15 (g) as directed ?Sween Lotion (Moisturizing lotion) ?Discharge Instruction: Apply moisturizing lotion as directed ?Topical ?Primary Dressing ?Promogran Prisma Matrix, 4.34 (sq in) (silver collagen) ?Discharge Instruction: Moisten collagen with saline or hydrogel ?Secondary Dressing ?Woven Gauze Sponge, Non-Sterile 4x4 in ?Discharge Instruction: Apply over primary dressing as directed. ?Secured With ?Compression Wrap ?CoFlex TLC XL 2-layer Compression System 4x7 (in/yd) ?Discharge Instruction: Apply CoFlex 2-layer compression as directed. (alt for 4 layer) ?Compression Stockings ?Add-Ons ?Electronic Signature(s) ?Signed: 04/10/2021 1:56:38 PM By: [name] MD FACS ?Signed: 04/10/2021 5:48:07 PM By: Baruch Gouty RN, BSN ?Entered By: [name] on 04/10/2021 13:56:37 ?-------------------------------------------------------------------------------- ?Multi-Disciplinary Care Plan Details ?Patient Name: ?Date of Service: ?[name] [name], [name] 04/10/2021 1:15 PM ?Medical Record Number: [id number] ?Patient Account Number: [id number] ?Date of Birth/Sex: ?Treating RN: ?[birth year] (76 y.o. [name] [name] ?Primary Care Taqwa Deem: [name], [name] ?Other Clinician: ?Referring Dev Dhondt: ?Treating Ransome Helwig/Extender: [name] ?[name], [name] ?Weeks in Treatment: 2 ?Multidisciplinary Care Plan reviewed with physician ?Active Inactive ?Wound/Skin Impairment ?Nursing Diagnoses: ?Impaired tissue  integrity ?Goals: ?Patient/caregiver will verbalize understanding of skin care regimen ?Date Initiated: 03/21/2021 ?Target Resolution Date: 04/18/2021 ?Goal Status: Active ?Ulcer/skin breakdown will have a volume reduct

## 2021-04-14 ENCOUNTER — Encounter (HOSPITAL_BASED_OUTPATIENT_CLINIC_OR_DEPARTMENT_OTHER): Payer: Self-pay

## 2021-04-14 ENCOUNTER — Other Ambulatory Visit: Payer: Self-pay

## 2021-04-14 ENCOUNTER — Emergency Department (HOSPITAL_BASED_OUTPATIENT_CLINIC_OR_DEPARTMENT_OTHER)
Admission: EM | Admit: 2021-04-14 | Discharge: 2021-04-14 | Disposition: A | Discharge status: Home / Self Care | Payer: PPO | Attending: Emergency Medicine | Admitting: Emergency Medicine

## 2021-04-14 ENCOUNTER — Emergency Department (HOSPITAL_BASED_OUTPATIENT_CLINIC_OR_DEPARTMENT_OTHER): Payer: PPO

## 2021-04-14 DIAGNOSIS — R600 Localized edema: Secondary | ICD-10-CM | POA: Diagnosis not present

## 2021-04-14 DIAGNOSIS — Z7951 Long term (current) use of inhaled steroids: Secondary | ICD-10-CM | POA: Insufficient documentation

## 2021-04-14 DIAGNOSIS — R2241 Localized swelling, mass and lump, right lower limb: Secondary | ICD-10-CM | POA: Diagnosis not present

## 2021-04-14 DIAGNOSIS — M7989 Other specified soft tissue disorders: Secondary | ICD-10-CM | POA: Insufficient documentation

## 2021-04-14 DIAGNOSIS — J449 Chronic obstructive pulmonary disease, unspecified: Secondary | ICD-10-CM | POA: Insufficient documentation

## 2021-04-14 DIAGNOSIS — L03115 Cellulitis of right lower limb: Secondary | ICD-10-CM | POA: Diagnosis not present

## 2021-04-14 NOTE — ED Notes (Signed)
Spoke w/[facility] and Dr. Rogene Houston as patient's lower leg is wrapped for wound care. Unable to scan lower leg in this condition. Dr. Rogene Houston will eval patient to determine how to proceed. ?

## 2021-04-14 NOTE — ED Provider Notes (Addendum)
?[facility] ?Provider Note ? ? ?CSN: [id number] ?Arrival date & time: 04/14/21  1452 ? ?  ? ?History ? ?Chief Complaint  ?Patient presents with  ? Leg Swelling  ? ? ?[name] [name] is a 76 y.o. male. ? ?Patient sent in from [facility] urgent care for concerns about swelling to the right lower extremity.  Patient has been being followed by wound care for the past month.  They been wrapping leg tightly.  Because of the wound that occurred in the proximal anterior tibial area when getting out of a car.  To them and try to keep the fluid down since he has chronic leg swelling.  [facility] gave him prescription for doxycycline.  And referred him in for Doppler studies to rule out DVT.  Patient last seen by wound care on April 3.  He is seen every week.  Patient also wears compression stockings on the left lower extremity.  Left lower extremity has chronic skin changes from the edema.  Patient denies any chest pain or shortness of breath.  Denies any fevers.  Patient was noted to be a little tachycardic with a heart rate in the low 100s.  Oxygen saturation is 94%.  Patient respirations are 18.  Patient denies any increased pain to the right lower extremity.  States that all the swelling seems to be from the knee above.  Basically just proximal to where they wrap his leg.  The wrapping stays in place for a week at a time and then wound care changes it out each week.  Patient is on home oxygen for his COPD. ? ?Past medical history is significant for COPD.  But must have some degree of either venous insufficiency or right sided heart failure due to the chronic leg swelling.  Patient is not on any blood thinners. ? ? ?  ? ?Home Medications ?Prior to Admission medications   ?Medication Sig Start Date End Date Taking? Authorizing Provider  ?albuterol (VENTOLIN HFA) 108 (90 Base) MCG/ACT inhaler Inhale 2 puffs into the lungs every 6 (six) hours as needed for wheezing or shortness of breath.  01/12/21   Rigoberto Noel, MD  ?ALPRAZolam Duanne Moron) 0.25 MG tablet Take 1 tablet (0.25 mg total) by mouth daily as needed. 06/24/20   Martyn Ehrich, NP  ?amLODipine (NORVASC) 5 MG tablet Take 5 mg by mouth daily.    [provider]  ?atorvastatin (LIPITOR) 40 MG tablet Take 40 mg by mouth daily. Pt takes two twice daily    [provider]  ?Budeson-Glycopyrrol-Formoterol (BREZTRI AEROSPHERE) 160-9-4.8 MCG/ACT AERO Inhale 2 puffs into the lungs in the morning and at bedtime. 12/21/20   Rigoberto Noel, MD  ?Budeson-Glycopyrrol-Formoterol (BREZTRI AEROSPHERE) 160-9-4.8 MCG/ACT AERO Inhale 2 puffs into the lungs in the morning and at bedtime. 04/05/21   Rigoberto Noel, MD  ?furosemide (LASIX) 20 MG tablet Take 20 mg by mouth daily. 12/03/18   [provider]  ?ibuprofen (ADVIL,MOTRIN) 200 MG tablet Take 400 mg by mouth every 6 (six) hours as needed.    [provider]  ?Multiple Vitamins-Minerals (MULTIVITAMIN WITH MINERALS) tablet Take 1 tablet by mouth daily.    [provider]  ?vitamin B-12 (CYANOCOBALAMIN) 1000 MCG tablet Take 1,000 mcg by mouth.    [provider]  ?   ? ?Allergies    ?Fluticasone-salmeterol   ? ?Review of Systems   ?Review of Systems  ?Constitutional:  Negative for chills and fever.  ?HENT:  Negative for ear pain and sore throat.   ?Eyes:  Negative for pain and visual disturbance.  ?Respiratory:  Negative for cough and shortness of breath.   ?Cardiovascular:  Positive for leg swelling. Negative for chest pain and palpitations.  ?Gastrointestinal:  Negative for abdominal pain and vomiting.  ?Genitourinary:  Negative for dysuria and hematuria.  ?Musculoskeletal:  Negative for arthralgias and back pain.  ?Skin:  Positive for wound. Negative for color change and rash.  ?Neurological:  Negative for seizures and syncope.  ?All other systems reviewed and are negative. ? ?Physical Exam ?Updated Vital Signs ?BP (!) 161/93 (BP Location: Right Arm)    Pulse (!) 104   Temp 98.3 ?F (36.8 ?C)   Resp 20   Ht 1.803 m ('5\' 11"'$ )   Wt 108.9 kg   SpO2 94%   BMI 33.47 kg/m?  ?Physical Exam ?Vitals and nursing note reviewed.  ?Constitutional:   ?   General: He is not in acute distress. ?   Appearance: Normal appearance. He is well-developed. He is not ill-appearing.  ?HENT:  ?   Head: Normocephalic and atraumatic.  ?Eyes:  ?   Conjunctiva/sclera: Conjunctivae normal.  ?Cardiovascular:  ?   Rate and Rhythm: Normal rate and regular rhythm.  ?   Heart sounds: No murmur heard. ?Pulmonary:  ?   Effort: Pulmonary effort is normal. No respiratory distress.  ?   Breath sounds: Normal breath sounds.  ?Abdominal:  ?   Palpations: Abdomen is soft.  ?   Tenderness: There is no abdominal tenderness.  ?Musculoskeletal:     ?   General: No swelling.  ?   Cervical back: Neck supple.  ?   Comments: Patient with chronic skin changes due to longstanding leg edema to both lower extremities.  Wrapping removed from his right lower extremity.  Still has dressing just over the place where the skin leg wound is that they are treating with wound care.  Distally cap refill to both feet is less than 2 seconds.  Patient has good movement of the toes.  The right lower extremity does have some swelling at the knee and above into the thigh.  A little bit of erythema right at the top of the right leg distal part of the knee.  With perhaps some slight increased warmth.  ?Skin: ?   General: Skin is warm and dry.  ?   Capillary Refill: Capillary refill takes less than 2 seconds.  ?Neurological:  ?   General: No focal deficit present.  ?   Mental Status: He is alert and oriented to person, place, and time.  ?Psychiatric:     ?   Mood and Affect: Mood normal.  ? ? ?ED Results / Procedures / Treatments   ?Labs ?(all labs ordered are listed, but only abnormal results are displayed) ?Labs Reviewed - No data to display ? ?EKG ?None ? ?Radiology ?No results found. ? ?Procedures ?Procedures  ? ? ?Medications  Ordered in ED ?Medications - No data to display ? ?ED Course/ Medical Decision Making/ A&P ?  ?                        ?Medical Decision Making ? ?Patient with chronic skin changes to both lower extremities.  It is clear that the wound care is trying to keep the right lower extremity fluid compressed out so that has a chance for his wound to heal.  Patient has Doppler studies pending.  [facility] walk-in has already given him a prescription for doxycycline to take for 7 days.  There is possibility that there could be some cellulitis.  But I suspect most of it is chronic changes of lower extremity swelling.  Because it is seen in both legs. ? ?If Doppler study is negative for DVT I think patient is good for discharge home. ? ? ?Doppler study negative for deep vein thrombosis. ? ?Final Clinical Impression(s) / ED Diagnoses ?Final diagnoses:  ?Right leg swelling  ? ? ?Rx / DC Orders ?ED Discharge Orders   ? ? None  ? ?  ? ? ?  ?Fredia Sorrow, MD ?04/14/21 1748 ? ?  ?Fredia Sorrow, MD ?04/14/21 1758 ? ?

## 2021-04-14 NOTE — ED Notes (Signed)
Pt's RLE has foam dressing + ACE bandage wrapped fairly tight. This EMT unwrapped bandage in preparation for EDP and [place].  ?

## 2021-04-14 NOTE — ED Triage Notes (Signed)
Was sent here by his physician to rule out DVT of right leg. Right leg swelling reported and noted.  ?

## 2021-04-14 NOTE — Discharge Instructions (Signed)
Doppler study here today negative for any deep vein clots.  Continue wrapping continue to follow-up with wound care as scheduled.  I would probably go and take the doxycycline could help if there is some mild underlying cellulitis.  Return for any new or worse symptoms. ?

## 2021-04-17 ENCOUNTER — Encounter (HOSPITAL_BASED_OUTPATIENT_CLINIC_OR_DEPARTMENT_OTHER): Payer: PPO | Admitting: General Surgery

## 2021-04-17 DIAGNOSIS — L97812 Non-pressure chronic ulcer of other part of right lower leg with fat layer exposed: Secondary | ICD-10-CM | POA: Diagnosis not present

## 2021-04-17 DIAGNOSIS — I87302 Chronic venous hypertension (idiopathic) without complications of left lower extremity: Secondary | ICD-10-CM | POA: Diagnosis not present

## 2021-04-17 DIAGNOSIS — I5032 Chronic diastolic (congestive) heart failure: Secondary | ICD-10-CM | POA: Diagnosis not present

## 2021-04-17 DIAGNOSIS — I87311 Chronic venous hypertension (idiopathic) with ulcer of right lower extremity: Secondary | ICD-10-CM | POA: Diagnosis not present

## 2021-04-17 NOTE — Progress Notes (Signed)
Kiener, Grier J. ([id number]) ?Visit Report for 04/17/2021 ?Arrival Information Details ?Patient Name: Date of Service: ?Puchalski, [name] 04/17/2021 12:30 PM ?Medical Record Number: [id number] ?Patient Account Number: [id number] ?Date of Birth/Sex: Treating RN: ?[birth year] (76 y.o. [name] ?Primary Care Swayze Kozuch: [name], [name]: ?Referring Barre Aydelott: ?Treating Dayquan Buys/Extender: [name] ?[name], [name] ?Weeks in Treatment: 3 ?Visit Information History Since Last Visit ?All ordered tests and consults were completed: Yes ?Patient Arrived: [name] ?Added or deleted any medications: No ?Arrival Time: 13:00 ?Any new allergies or adverse reactions: No ?Accompanied By: self ?Had a fall or experienced change in No ?Transfer Assistance: None ?activities of daily living that may affect ?Patient Identification Verified: Yes ?risk of falls: ?Secondary Verification Process Completed: Yes ?Signs or symptoms of abuse/neglect since last visito No ?Patient Requires Transmission-Based Precautions: No ?Hospitalized since last visit: No ?Patient Has Alerts: No ?Implantable device outside of the clinic excluding No ?cellular tissue based products placed in the center ?since last visit: ?Has Dressing in Place as Prescribed: Yes ?Has Compression in Place as Prescribed: No ?Pain Present Now: No ?Notes ?wrap removed by PCP, sent to ER to r/o DVT has ace wrap in place ?, ?Electronic Signature(s) ?Signed: 04/17/2021 6:48:31 PM By: Baruch Gouty RN, BSN ?Entered By: Baruch Gouty on 04/17/2021 13:03:55 ?-------------------------------------------------------------------------------- ?Compression Therapy Details ?Patient Name: Date of Service: ?Deer, [name] 04/17/2021 12:30 PM ?Medical Record Number: [id number] ?Patient Account Number: [id number] ?Date of Birth/Sex: Treating RN: ?02/08/1945 (76 y.o. [name] ?Primary Care Charonda Hefter: [name], [name]: ?Referring  Nastashia Gallo: ?Treating Kierria Feigenbaum/Extender: [name] ?[name], [name] ?Weeks in Treatment: 3 ?Compression Therapy Performed for Wound Assessment: Wound #1 Right,Anterior Lower Leg ?Performed By: Clinician Baruch Gouty, RN ?Compression Type: Double Layer ?Post Procedure Diagnosis ?Same as Pre-procedure ?Electronic Signature(s) ?Signed: 04/17/2021 6:48:31 PM By: Baruch Gouty RN, BSN ?Entered By: Baruch Gouty on 04/17/2021 13:50:40 ?-------------------------------------------------------------------------------- ?Encounter Discharge Information Details ?Patient Name: ?Date of Service: ?Sprankle, [name] 04/17/2021 12:30 PM ?Medical Record Number: [id number] ?Patient Account Number: [id number] ?Date of Birth/Sex: ?Treating RN: ?20-Mar-1945 (76 y.o. [name] ?Primary Care Drue Harr: [name], [name] ?Other Clinician: ?Referring Kharee Lesesne: ?Treating Kathaleen Dudziak/Extender: [name] ?[name], [name] ?Weeks in Treatment: 3 ?Encounter Discharge Information Items Post Procedure Vitals ?Discharge Condition: Stable ?Temperature (F): 97.7 ?Ambulatory Status: [name] ?Pulse (bpm): 108 ?Discharge Destination: Home ?Respiratory Rate (breaths/min): 22 ?Transportation: Private Auto ?Blood Pressure (mmHg): 151/77 ?Accompanied By: self ?Schedule Follow-up Appointment: Yes ?Clinical Summary of Care: Patient Declined ?Electronic Signature(s) ?Signed: 04/17/2021 6:48:31 PM By: Baruch Gouty RN, BSN ?Entered By: Baruch Gouty on 04/17/2021 14:08:54 ?-------------------------------------------------------------------------------- ?Lower Extremity Assessment Details ?Patient Name: ?Date of Service: ?Santagata, [name] 04/17/2021 12:30 PM ?Medical Record Number: [id number] ?Patient Account Number: [id number] ?Date of Birth/Sex: ?Treating RN: ?05/22/1945 (76 y.o. M) ?Primary Care Sharika Mosquera: [name], [name] ?Other Clinician: ?Referring Nikki Glanzer: ?Treating Breckan Cafiero/Extender: [name] ?[name], [name] ?Weeks in Treatment:  3 ?Edema Assessment ?Assessed: [Left: No] [Right: No] ?Edema: [Left: Ye] [Right: s] ?Calf ?Left: Right: ?Point of Measurement: 31 cm From Medial Instep 47 cm ?Ankle ?Left: Right: ?Point of Measurement: 13 cm From Medial Instep 25 cm ?Vascular Assessment ?Pulses: ?Dorsalis Pedis ?Palpable: [Right:Yes] ?Electronic Signature(s) ?Signed: 04/17/2021 5:39:08 PM By: Adline Peals ?Entered By: Adline Peals on 04/17/2021 13:43:46 ?-------------------------------------------------------------------------------- ?Multi Wound Chart Details ?Patient Name: ?Date of Service: ?Hellstrom, [name] 04/17/2021 12:30 PM ?Medical Record Number: [id number] ?Patient Account Number: [id number] ?Date of Birth/Sex: ?Treating RN: ?[birth year] (76 y.o. M) ?Primary Care Soni Kegel: [name], [name] ?Other Clinician: ?Referring Ayako Tapanes: ?Treating  Daivon Rayos/Extender: [name] ?[name], [name] ?Weeks in Treatment: 3 ?Vital Signs ?Height(in): 71 ?Pulse(bpm): 108 ?Weight(lbs): 238 ?Blood Pressure(mmHg): 151/77 ?Body Mass Index(BMI): 33.2 ?Temperature(??F): 97.7 ?Respiratory Rate(breaths/min): 22 ?Photos: [N/A:N/A] ?Right, Anterior Lower Leg N/A N/A ?Wound Location: ?Trauma N/A N/A ?Wounding Event: ?Trauma, Other N/A N/A ?Primary Etiology: ?Chronic Obstructive Pulmonary N/A N/A ?Comorbid History: ?Disease (COPD), Coronary Artery ?Disease, Hypertension, Neuropathy ?02/16/2021 N/A N/A ?Date Acquired: ?3 N/A N/A ?Weeks of Treatment: ?Open N/A N/A ?Wound Status: ?No N/A N/A ?Wound Recurrence: ?0.5x1.2x0.1 N/A N/A ?Measurements L x W x D (cm) ?0.471 N/A N/A ?A (cm?) : ?rea ?0.047 N/A N/A ?Volume (cm?) : ?89.00% N/A N/A ?% Reduction in A rea: ?89.00% N/A N/A ?% Reduction in Volume: ?Full Thickness Without Exposed N/A N/A ?Classification: ?Support Structures ?Medium N/A N/A ?Exudate A mount: ?Serosanguineous N/A N/A ?Exudate Type: ?red, brown N/A N/A ?Exudate Color: ?Distinct, outline attached N/A N/A ?Wound Margin: ?Large (67-100%) N/A  N/A ?Granulation A mount: ?Red N/A N/A ?Granulation Quality: ?None Present (0%) N/A N/A ?Necrotic A mount: ?Fat Layer (Subcutaneous Tissue): Yes N/A N/A ?Exposed Structures: ?Fascia: No ?Tendon: No ?Muscle: No ?Joint: No ?Bone: No ?Large (67-100%) N/A N/A ?Epithelialization: ?Debridement - Excisional N/A N/A ?Debridement: ?Pre-procedure Verification/Time Out 13:50 N/A N/A ?Taken: ?Other N/A N/A ?Pain Control: ?Subcutaneous, Slough N/A N/A ?Tissue Debrided: ?Skin/Subcutaneous Tissue N/A N/A ?Level: ?0.6 N/A N/A ?Debridement A (sq cm): ?rea ?Curette N/A N/A ?Instrument: ?Minimum N/A N/A ?Bleeding: ?Pressure N/A N/A ?Hemostasis A chieved: ?0 N/A N/A ?Procedural Pain: ?0 N/A N/A ?Post Procedural Pain: ?Procedure was tolerated well N/A N/A ?Debridement Treatment Response: ?0.5x1.2x0.1 N/A N/A ?Post Debridement Measurements L x ?W x D (cm) ?0.047 N/A N/A ?Post Debridement Volume: (cm?) ?Compression Therapy N/A N/A ?Procedures Performed: ?Debridement ?Treatment Notes ?Electronic Signature(s) ?Signed: 04/17/2021 2:02:18 PM By: [name] MD FACS ?Entered By: [name] on 04/17/2021 14:02:17 ?-------------------------------------------------------------------------------- ?Multi-Disciplinary Care Plan Details ?Patient Name: ?Date of Service: ?Spivak, [name] 04/17/2021 12:30 PM ?Medical Record Number: [id number] ?Patient Account Number: [id number] ?Date of Birth/Sex: ?Treating RN: ?[birth year] (76 y.o. M) ?Primary Care Jewelene Mairena: [name], [name] ?Other Clinician: ?Referring Xavier Fournier: ?Treating Arlington Sigmund/Extender: [name] ?[name], [name] ?Weeks in Treatment: 3 ?Multidisciplinary Care Plan reviewed with physician ?Active Inactive ?Wound/Skin Impairment ?Nursing Diagnoses: ?Impaired tissue integrity ?Goals: ?Patient/caregiver will verbalize understanding of skin care regimen ?Date Initiated: 03/21/2021 ?Target Resolution Date: 05/15/2021 ?Goal Status: Active ?Ulcer/skin breakdown will have a volume reduction  of 30% by week 4 ?Date Initiated: 03/21/2021 ?Target Resolution Date: 05/15/2021 ?Goal Status: Active ?Interventions: ?Assess patient/caregiver ability to obtain necessary supplies ?Assess patient/caregiver ability to perform

## 2021-04-17 NOTE — Progress Notes (Signed)
[name] ([id number]) ?Visit Report for 04/17/2021 ?Chief Complaint Document Details ?Patient Name: Date of Service: ?[name] 04/17/2021 12:30 PM ?Medical Record Number: [id number] ?Patient Account Number: [id number] ?Date of Birth/Sex: Treating RN: ?[birth year] (76 y.o. M) ?Primary Care Provider: [name], [name]: ?Referring Provider: ?Treating Provider/Extender: [name] ?[name], [name] ?Weeks in Treatment: 3 ?Information Obtained from: Patient ?Chief Complaint ?Patient seen for complaints of Non-Healing Wound. ?Electronic Signature(s) ?Signed: 04/17/2021 2:02:26 PM By: [name] MD FACS ?Entered By: [name] on 04/17/2021 14:02:25 ?-------------------------------------------------------------------------------- ?Debridement Details ?Patient Name: Date of Service: ?[name] 04/17/2021 12:30 PM ?Medical Record Number: [id number] ?Patient Account Number: [id number] ?Date of Birth/Sex: Treating RN: ?[birth year] (76 y.o. [name] [name] ?Primary Care Provider: [name], [name]: ?Referring Provider: ?Treating Provider/Extender: [name] ?[name], [name] ?Weeks in Treatment: 3 ?Debridement Performed for Assessment: Wound #1 Right,Anterior Lower Leg ?Performed By: Physician [name], MD ?Debridement Type: Debridement ?Level of Consciousness (Pre-procedure): Awake and Alert ?Pre-procedure Verification/Time Out Yes - 13:50 ?Taken: ?Start Time: 13:53 ?Pain Control: ?Other : benzocaine 20% spray ?T Area Debrided (L x W): ?otal 0.5 (cm) x 1.2 (cm) = 0.6 (cm?) ?Tissue and other material debrided: Viable, Non-Viable, Slough, Subcutaneous, Slough ?Level: Skin/Subcutaneous Tissue ?Debridement Description: Excisional ?Instrument: Curette ?Bleeding: Minimum ?Hemostasis Achieved: Pressure ?Procedural Pain: 0 ?Post Procedural Pain: 0 ?Response to Treatment: Procedure was tolerated well ?Level of Consciousness (Post- Awake and  Alert ?procedure): ?Post Debridement Measurements of Total Wound ?Length: (cm) 0.5 ?Width: (cm) 1.2 ?Depth: (cm) 0.1 ?Volume: (cm?) 0.047 ?Character of Wound/Ulcer Post Debridement: Improved ?Post Procedure Diagnosis ?Same as Pre-procedure ?Electronic Signature(s) ?Signed: 04/17/2021 3:05:26 PM By: [name] MD FACS ?Signed: 04/17/2021 6:48:31 PM By: Baruch Gouty RN, BSN ?Entered By: Baruch Gouty on 04/17/2021 13:55:04 ?-------------------------------------------------------------------------------- ?HPI Details ?Patient Name: Date of Service: ?[name] 04/17/2021 12:30 PM ?Medical Record Number: [id number] ?Patient Account Number: [id number] ?Date of Birth/Sex: Treating RN: ?11-May-1945 (76 y.o. M) ?Primary Care Provider: [name], [name]: ?Referring Provider: ?Treating Provider/Extender: [name] ?[name], [name] ?Weeks in Treatment: 3 ?History of Present Illness ?HPI Description: ADMISSION ?03/21/21 ?This is a 76 year old man with a past medical history notable for COPD and chronic hypoxic respiratory failure, requiring 4 L of oxygen, coronary artery ?disease, hypertension, and bilateral lower extremity chronic edema. On February 9, he struck his right leg with a car door which opened up a wound. It has ?failed to heal since that time. He saw a dermatologist that prescribed MetroGel, with little improvement. He was placed on Keflex and has a few days left of ?this antibiotic. He does wear support stockings on a regular basis, but the compression is fairly low. ABI in clinic today was 1.29. The wound itself has thick ?yellow slough, but no surrounding erythema or induration. No foul odor. He does not have significant pain. ?03/27/2021: Last week, I took a PCR culture which was positive for Enterococcus faecalis. I initiated him on Augmentin and he is tolerating this well. Today, the ?wound is much cleaner and is coming in size. He did not love the 4-layer compression, but he  is willing to tolerate it to get the wound closed. He has been in ?Iodoflex under the compression wrap. ?04/03/2021: Today, he is complaining fairly bitterly about his compression wraps. He says that they are too tight and he feels like his toes go numb from time to ?time. However, the wound is smaller and has significantly less slough. We have been using Iodoflex under the  compression wrap. ?04/10/2021: Last week, we changed his dressing to Coflex. He says that although he really does not like the compression wraps, this 1 was more tolerable. The ?wound is much smaller with good epithelial skin coming around the periphery and is very clean. We have been using Iodoflex under the Coflex compression. ?04/17/2021: This past Friday, the patient was concerned at how swollen his knee was and he went to his primary care office. They sent him to the ER for DVT ?scan and prescribed doxycycline for possible cellulitis. In the emergency department, they explained that he was in compression to try and heal his wound and ?minimize the fluid buildup. His DVT scan was negative. The ER provider did not think it looked like he had cellulitis, as he had similar skin changes bilaterally. ?The patient has not filled the doxycycline prescription. His wound was wrapped to just below the tibial tuberosity; his swelling begins just proximal to this, as ?would be expected. His edema control aside from where he was wrapped is quite poor with 4+ pitting edema. He apparently takes only 20 mg of Lasix daily, ?with instructions to increase to double if he has too much fluid, but he has not done so. ?Electronic Signature(s) ?Signed: 04/17/2021 2:04:27 PM By: [name] MD FACS ?Entered By: [name] on 04/17/2021 14:04:27 ?-------------------------------------------------------------------------------- ?Physical Exam Details ?Patient Name: Date of Service: ?[name] 04/17/2021 12:30 PM ?Medical Record Number:  [id number] ?Patient Account Number: [id number] ?Date of Birth/Sex: Treating RN: ?[birth year] (76 y.o. M) ?Primary Care Provider: [name], [name]: ?Referring Provider: ?Treating Provider/Extender: [name] ?[name], [name] ?Weeks in Treatment: 3 ?Constitutional ?He is hypertensive, but asymptomatic.Marland Kitchen Slightly tachycardic, asymptomatic.. . . No acute distress. ?Respiratory ?Normal work of breathing on room air; supplemental oxygen sitting in the bedside chair.Marland Kitchen ?Notes ?04/17/2021: The wound continues to contract and epithelialize. There is a small area of slough and dry eschar. No periwound skin breakdown. Edema control ?where he has been wrapped is good, above this level it is quite poor. ?Electronic Signature(s) ?Signed: 04/17/2021 2:05:54 PM By: [name] MD FACS ?Entered By: [name] on 04/17/2021 14:05:54 ?-------------------------------------------------------------------------------- ?Physician Orders Details ?Patient Name: ?Date of Service: ?Jaspers, [name] 04/17/2021 12:30 PM ?Medical Record Number: [id number] ?Patient Account Number: [id number] ?Date of Birth/Sex: ?Treating RN: ?Jul 12, 1945 (76 y.o. [name] [name] ?Primary Care Provider: [name], [name] ?Other Clinician: ?Referring Provider: ?Treating Provider/Extender: [name] ?[name], [name] ?Weeks in Treatment: 3 ?Verbal / Phone Orders: No ?Diagnosis Coding ?ICD-10 Coding ?Code Description ?L97.211 Non-pressure chronic ulcer of right calf limited to breakdown of skin ?J96.11 Chronic respiratory failure with hypoxia ?J44.9 Chronic obstructive pulmonary disease, unspecified ?H88.50 Chronic diastolic (congestive) heart failure ?I87.309 Chronic venous hypertension (idiopathic) without complications of unspecified lower extremity ?I25.10 Atherosclerotic heart disease of native coronary artery without angina pectoris ?Follow-up Appointments ?ppointment in 1 week. - with Dr. Celine Ahr with [facility] ?Return A ?Bathing/  Shower/ Hygiene ?May shower with protection but do not get wound dressing(s) wet. - Can use cast protector bag: May get at [facility], [place] or [place] ?Edema Control - Lymphedema / SCD / Other ?Elevate legs to the level of the he

## 2021-04-24 ENCOUNTER — Encounter (HOSPITAL_BASED_OUTPATIENT_CLINIC_OR_DEPARTMENT_OTHER): Payer: PPO | Admitting: General Surgery

## 2021-04-24 DIAGNOSIS — L97812 Non-pressure chronic ulcer of other part of right lower leg with fat layer exposed: Secondary | ICD-10-CM | POA: Diagnosis not present

## 2021-04-24 DIAGNOSIS — I87311 Chronic venous hypertension (idiopathic) with ulcer of right lower extremity: Secondary | ICD-10-CM | POA: Diagnosis not present

## 2021-04-24 NOTE — Progress Notes (Signed)
Mummert, Dejon J. ([id number]) ?Visit Report for 04/24/2021 ?Chief Complaint Document Details ?Patient Name: Date of Service: ?[name], [name] 04/24/2021 1:15 PM ?Medical Record Number: [id number] ?Patient Account Number: [id number] ?Date of Birth/Sex: Treating RN: ?08-22-1945 (76 y.o. M) ?Primary Care Provider: [name], [name]: ?Referring Provider: ?Treating Provider/Extender: [name] ?[name], [name] ?Weeks in Treatment: 4 ?Information Obtained from: Patient ?Chief Complaint ?Patient seen for complaints of Non-Healing Wound. ?Electronic Signature(s) ?Signed: 04/24/2021 2:03:34 PM By: [name] MD FACS ?Entered By: [name] on 04/24/2021 14:03:34 ?-------------------------------------------------------------------------------- ?Debridement Details ?Patient Name: Date of Service: ?[name], [name] 04/24/2021 1:15 PM ?Medical Record Number: [id number] ?Patient Account Number: [id number] ?Date of Birth/Sex: Treating RN: ?[birth year] (76 y.o. Jerilynn Mages) Dellie Catholic ?Primary Care Provider: [name], [name]: ?Referring Provider: ?Treating Provider/Extender: [name] ?[name], [name] ?Weeks in Treatment: 4 ?Debridement Performed for Assessment: Wound #1 Right,Anterior Lower Leg ?Performed By: Physician [name], MD ?Debridement Type: Debridement ?Level of Consciousness (Pre-procedure): Awake and Alert ?Pre-procedure Verification/Time Out Yes - 13:49 ?Taken: ?Start Time: 13:49 ?Pain Control: Lidocaine 5% topical ointment ?T Area Debrided (L x W): ?otal 0.2 (cm) x 0.2 (cm) = 0.04 (cm?) ?Tissue and other material debrided: Viable, Non-Viable, Eschar, Slough, Subcutaneous, Slough ?Level: Skin/Subcutaneous Tissue ?Debridement Description: Excisional ?Instrument: Curette ?Bleeding: Minimum ?Hemostasis Achieved: Pressure ?End Time: 13:51 ?Procedural Pain: 0 ?Post Procedural Pain: 0 ?Response to Treatment: Procedure was tolerated well ?Level of Consciousness  (Post- Awake and Alert ?procedure): ?Post Debridement Measurements of Total Wound ?Length: (cm) 0.2 ?Width: (cm) 0.2 ?Depth: (cm) 0.1 ?Volume: (cm?) 0.003 ?Character of Wound/Ulcer Post Debridement: Improved ?Post Procedure Diagnosis ?Same as Pre-procedure ?Electronic Signature(s) ?Signed: 04/24/2021 3:26:03 PM By: [name] MD FACS ?Signed: 04/24/2021 5:45:05 PM By: Dellie Catholic RN ?Entered By: Dellie Catholic on 04/24/2021 13:54:53 ?-------------------------------------------------------------------------------- ?HPI Details ?Patient Name: Date of Service: ?[name] [name], [name] 04/24/2021 1:15 PM ?Medical Record Number: [id number] ?Patient Account Number: [id number] ?Date of Birth/Sex: Treating RN: ?17-Feb-1945 (76 y.o. M) ?Primary Care Provider: [name], [name]: ?Referring Provider: ?Treating Provider/Extender: [name] ?[name], [name] ?Weeks in Treatment: 4 ?History of Present Illness ?HPI Description: ADMISSION ?03/21/21 ?This is a 76 year old man with a past medical history notable for COPD and chronic hypoxic respiratory failure, requiring 4 L of oxygen, coronary artery ?disease, hypertension, and bilateral lower extremity chronic edema. On February 9, he struck his right leg with a car door which opened up a wound. It has ?failed to heal since that time. He saw a dermatologist that prescribed MetroGel, with little improvement. He was placed on Keflex and has a few days left of ?this antibiotic. He does wear support stockings on a regular basis, but the compression is fairly low. ABI in clinic today was 1.29. The wound itself has thick ?yellow slough, but no surrounding erythema or induration. No foul odor. He does not have significant pain. ?03/27/2021: Last week, I took a PCR culture which was positive for Enterococcus faecalis. I initiated him on Augmentin and he is tolerating this well. Today, the ?wound is much cleaner and is coming in size. He did not love the 4-layer  compression, but he is willing to tolerate it to get the wound closed. He has been in ?Iodoflex under the compression wrap. ?04/03/2021: Today, he is complaining fairly bitterly about his compression wraps. He says that they are too tight and he feels like his toes go numb from time to ?time. However, the wound is smaller and has significantly less slough. We have been using Iodoflex  under the compression wrap. ?04/10/2021: Last week, we changed his dressing to Coflex. He says that although he really does not like the compression wraps, this 1 was more tolerable. The ?wound is much smaller with good epithelial skin coming around the periphery and is very clean. We have been using Iodoflex under the Coflex compression. ?04/17/2021: This past Friday, the patient was concerned at how swollen his knee was and he went to his primary care office. They sent him to the ER for DVT ?scan and prescribed doxycycline for possible cellulitis. In the emergency department, they explained that he was in compression to try and heal his wound and ?minimize the fluid buildup. His DVT scan was negative. The ER provider did not think it looked like he had cellulitis, as he had similar skin changes bilaterally. ?The patient has not filled the doxycycline prescription. His wound was wrapped to just below the tibial tuberosity; his swelling begins just proximal to this, as ?would be expected. His edema control aside from where he was wrapped is quite poor with 4+ pitting edema. He apparently takes only 20 mg of Lasix daily, ?with instructions to increase to double if he has too much fluid, but he has not done so. ?04/24/2021: He took his wrap off on Saturday in order to undergo nail trimming. His leg is quite edematous today, but the wound is nearly closed. ?Electronic Signature(s) ?Signed: 04/24/2021 2:04:13 PM By: [name] MD FACS ?Entered By: [name] on 04/24/2021  14:04:13 ?-------------------------------------------------------------------------------- ?Physical Exam Details ?Patient Name: Date of Service: ?[name] [name], [name] 04/24/2021 1:15 PM ?Medical Record Number: [id number] ?Patient Account Number: [id number] ?Date of Birth/Sex: Treating RN: ?Jun 02, 1945 (76 y.o. M) ?Primary Care Provider: [name], [name]: ?Referring Provider: ?Treating Provider/Extender: [name] ?[name], [name] ?Weeks in Treatment: 4 ?Constitutional ?. Slightly tachycardic, asymptomatic.. . . No acute distress. ?Respiratory ?Normal work of breathing on supplemental oxygen. ?Notes ?04/24/2021: The wound is extremely small with just some dried eschar overlying a small open area. No periwound breakdown. Edema control is poor, consistent ?with the fact that his wrap was removed several days ago. ?Electronic Signature(s) ?Signed: 04/24/2021 2:05:49 PM By: [name] MD FACS ?Entered By: [name] on 04/24/2021 14:05:49 ?-------------------------------------------------------------------------------- ?Physician Orders Details ?Patient Name: Date of Service: ?[name] [name], [name] 04/24/2021 1:15 PM ?Medical Record Number: [id number] ?Patient Account Number: [id number] ?Date of Birth/Sex: Treating RN: ?1945/07/10 (76 y.o. Jerilynn Mages) Dellie Catholic ?Primary Care Provider: [name], [name]: ?Referring Provider: ?Treating Provider/Extender: [name] ?[name], [name] ?Weeks in Treatment: 4 ?Verbal / Phone Orders: No ?Diagnosis Coding ?ICD-10 Coding ?Code Description ?L97.211 Non-pressure chronic ulcer of right calf limited to breakdown of skin ?J96.11 Chronic respiratory failure with hypoxia ?J44.9 Chronic obstructive pulmonary disease, unspecified ?R48.54 Chronic diastolic (congestive) heart failure ?I87.309 Chronic venous hypertension (idiopathic) without complications of unspecified lower extremity ?I25.10 Atherosclerotic heart disease of native coronary artery  without angina pectoris ?Follow-up Appointments ?ppointment in 1 week. - with Dr. Celine Ahr with [facility] ?Return A ?Bathing/ Shower/ Hygiene ?May shower with protection but do not get wound dressing(s) wet. - Can use cast protector bag: May get at [facility], [place] or [place] ?Edema

## 2021-04-24 NOTE — Progress Notes (Signed)
[name], [name] ([id number]) ?Visit Report for 04/24/2021 ?Arrival Information Details ?Patient Name: Date of Service: ?[name], [name] 04/24/2021 1:15 PM ?Medical Record Number: [id number] ?Patient Account Number: [id number] ?Date of Birth/Sex: Treating RN: ?10-Nov-1945 (76 y.o. Jerilynn Mages) Dellie Catholic ?Primary Care Trumaine Wimer: [name], [name]: ?Referring Ambry Dix: ?Treating Trinitie Mcgirr/Extender: [name] ?[name], [name] ?Weeks in Treatment: 4 ?Visit Information History Since Last Visit ?Added or deleted any medications: No ?Patient Arrived: [name] [name] ?Any new allergies or adverse reactions: No ?Arrival Time: 13:28 ?Had a fall or experienced change in No ?Accompanied By: self ?activities of daily living that may affect ?Transfer Assistance: None ?risk of falls: ?Patient Identification Verified: Yes ?Signs or symptoms of abuse/neglect since last visito No ?Patient Requires Transmission-Based Precautions: No ?Hospitalized since last visit: No ?Patient Has Alerts: No ?Implantable device outside of the clinic excluding No ?cellular tissue based products placed in the center ?since last visit: ?Has Dressing in Place as Prescribed: Yes ?Pain Present Now: No ?Electronic Signature(s) ?Signed: 04/24/2021 5:45:05 PM By: Dellie Catholic RN ?Entered By: Dellie Catholic on 04/24/2021 13:29:31 ?-------------------------------------------------------------------------------- ?Compression Therapy Details ?Patient Name: Date of Service: ?Rembert, [name] 04/24/2021 1:15 PM ?Medical Record Number: [id number] ?Patient Account Number: [id number] ?Date of Birth/Sex: Treating RN: ?[birth year] (76 y.o. Jerilynn Mages) Dellie Catholic ?Primary Care Mariaisabel Bodiford: [name], [name]: ?Referring Shahab Polhamus: ?Treating Deandrew Hoecker/Extender: [name] ?[name], [name] ?Weeks in Treatment: 4 ?Compression Therapy Performed for Wound Assessment: Wound #1 Right,Anterior Lower Leg ?Performed By: Clinician Dellie Catholic,  RN ?Compression Type: Double Layer ?Post Procedure Diagnosis ?Same as Pre-procedure ?Electronic Signature(s) ?Signed: 04/24/2021 5:45:05 PM By: Dellie Catholic RN ?Entered By: Dellie Catholic on 04/24/2021 17:42:57 ?-------------------------------------------------------------------------------- ?Encounter Discharge Information Details ?Patient Name: ?Date of Service: ?[name], [name] 04/24/2021 1:15 PM ?Medical Record Number: [id number] ?Patient Account Number: [id number] ?Date of Birth/Sex: ?Treating RN: ?1945/04/27 (76 y.o. Jerilynn Mages) Dellie Catholic ?Primary Care Magdala Brahmbhatt: [name], [name] ?Other Clinician: ?Referring Ayelen Sciortino: ?Treating Avaley Coop/Extender: [name] ?[name], [name] ?Weeks in Treatment: 4 ?Encounter Discharge Information Items Post Procedure Vitals ?Discharge Condition: Stable ?Temperature (F): 97.8 ?Ambulatory Status: [name] [name] ?Pulse (bpm): 108 ?Discharge Destination: Home ?Respiratory Rate (breaths/min): 20 ?Transportation: Private Auto ?Blood Pressure (mmHg): 136/80 ?Accompanied By: self ?Schedule Follow-up Appointment: Yes ?Clinical Summary of Care: Patient Declined ?Electronic Signature(s) ?Signed: 04/24/2021 5:45:05 PM By: Dellie Catholic RN ?Entered By: Dellie Catholic on 04/24/2021 17:44:24 ?-------------------------------------------------------------------------------- ?Lower Extremity Assessment Details ?Patient Name: ?Date of Service: ?[name], [name] 04/24/2021 1:15 PM ?Medical Record Number: [id number] ?Patient Account Number: [id number] ?Date of Birth/Sex: ?Treating RN: ?1945-11-16 (76 y.o. Jerilynn Mages) Dellie Catholic ?Primary Care Javante Nilsson: [name], [name] ?Other Clinician: ?Referring Terrill Alperin: ?Treating Greycen Felter/Extender: [name] ?[name], [name] ?Weeks in Treatment: 4 ?Edema Assessment ?Assessed: [Left: No] [Right: No] ?Edema: [Left: Ye] [Right: s] ?Calf ?Left: Right: ?Point of Measurement: 31 cm From Medial Instep 46.9 cm ?Ankle ?Left: Right: ?Point of Measurement: 13 cm From  Medial Instep 25.8 cm ?Knee To Floor ?Left: Right: ?From Medial Instep 51 cm ?Electronic Signature(s) ?Signed: 04/24/2021 5:45:05 PM By: Dellie Catholic RN ?Entered By: Dellie Catholic on 04/24/2021 13:35:43 ?-------------------------------------------------------------------------------- ?Multi Wound Chart Details ?Patient Name: ?Date of Service: ?[name], [name] 04/24/2021 1:15 PM ?Medical Record Number: [id number] ?Patient Account Number: [id number] ?Date of Birth/Sex: ?Treating RN: ?04-18-1945 (76 y.o. M) ?Primary Care Mackinsey Pelland: ?Other Clinician: ?[name], [name] ?Referring Sheily Lineman: ?Treating Tamar Miano/Extender: [name] ?[name], [name] ?Weeks in Treatment: 4 ?Vital Signs ?Height(in): 71 ?Pulse(bpm): 108 ?Weight(lbs): 238 ?Blood Pressure(mmHg): 136/80 ?Body Mass Index(BMI): 33.2 ?Temperature(??F): 97.8 ?Respiratory Rate(breaths/min): 20 ?Photos: [N/A:N/A] ?Right, Anterior Lower  Leg N/A N/A ?Wound Location: ?Trauma N/A N/A ?Wounding Event: ?Trauma, Other N/A N/A ?Primary Etiology: ?Chronic Obstructive Pulmonary N/A N/A ?Comorbid History: ?Disease (COPD), Coronary Artery ?Disease, Hypertension, Neuropathy ?02/16/2021 N/A N/A ?Date Acquired: ?4 N/A N/A ?Weeks of Treatment: ?Open N/A N/A ?Wound Status: ?No N/A N/A ?Wound Recurrence: ?0.2x0.2x0.1 N/A N/A ?Measurements L x W x D (cm) ?0.031 N/A N/A ?A (cm?) : ?rea ?0.003 N/A N/A ?Volume (cm?) : ?99.30% N/A N/A ?% Reduction in A rea: ?99.30% N/A N/A ?% Reduction in Volume: ?Full Thickness Without Exposed N/A N/A ?Classification: ?Support Structures ?Medium N/A N/A ?Exudate A mount: ?Serosanguineous N/A N/A ?Exudate Type: ?red, brown N/A N/A ?Exudate Color: ?Distinct, outline attached N/A N/A ?Wound Margin: ?Large (67-100%) N/A N/A ?Granulation A mount: ?Red N/A N/A ?Granulation Quality: ?None Present (0%) N/A N/A ?Necrotic A mount: ?Fat Layer (Subcutaneous Tissue): Yes N/A N/A ?Exposed Structures: ?Fascia: No ?Tendon: No ?Muscle: No ?Joint: No ?Bone: No ?Large  (67-100%) N/A N/A ?Epithelialization: ?Debridement - Excisional N/A N/A ?Debridement: ?Pre-procedure Verification/Time Out 13:49 N/A N/A ?Taken: ?Lidocaine 5% topical ointment N/A N/A ?Pain Control: ?Necrotic/Eschar, Subcutaneous, N/A N/A ?Tissue Debrided: ?Slough ?Skin/Subcutaneous Tissue N/A N/A ?Level: ?0.04 N/A N/A ?Debridement A (sq cm): ?rea ?Curette N/A N/A ?Instrument: ?Minimum N/A N/A ?Bleeding: ?Pressure N/A N/A ?Hemostasis Achieved: ?0 N/A N/A ?Procedural Pain: ?0 N/A N/A ?Post Procedural Pain: ?Debridement Treatment Response: Procedure was tolerated well N/A N/A ?Post Debridement Measurements L x 0.2x0.2x0.1 N/A N/A ?W x D (cm) ?0.003 N/A N/A ?Post Debridement Volume: (cm?) ?Debridement N/A N/A ?Procedures Performed: ?Treatment Notes ?Electronic Signature(s) ?Signed: 04/24/2021 2:03:21 PM By: [name] MD FACS ?Entered By: [name] on 04/24/2021 14:03:20 ?-------------------------------------------------------------------------------- ?Multi-Disciplinary Care Plan Details ?Patient Name: ?Date of Service: ?[name], [name] 04/24/2021 1:15 PM ?Medical Record Number: [id number] ?Patient Account Number: [id number] ?Date of Birth/Sex: ?Treating RN: ?02/25/1945 (76 y.o. Jerilynn Mages) Dellie Catholic ?Primary Care Dustine Stickler: [name], [name] ?Other Clinician: ?Referring Sharmane Dame: ?Treating Kerianne Gurr/Extender: [name] ?[name], [name] ?Weeks in Treatment: 4 ?Multidisciplinary Care Plan reviewed with physician ?Active Inactive ?Wound/Skin Impairment ?Nursing Diagnoses: ?Impaired tissue integrity ?Goals: ?Patient/caregiver will verbalize understanding of skin care regimen ?Date Initiated: 03/21/2021 ?Target Resolution Date: 05/15/2021 ?Goal Status: Active ?Ulcer/skin breakdown will have a volume reduction of 30% by week 4 ?Date Initiated: 03/21/2021 ?Target Resolution Date: 05/15/2021 ?Goal Status: Active ?Interventions: ?Assess patient/caregiver ability to obtain necessary supplies ?Assess patient/caregiver  ability to perform ulcer/skin care regimen upon admission and as needed ?Assess ulceration(s) every visit ?Provide education on ulcer and skin care ?Treatment Activities: ?Topical wound management initiated : 03/21/2021

## 2021-05-01 ENCOUNTER — Encounter (HOSPITAL_BASED_OUTPATIENT_CLINIC_OR_DEPARTMENT_OTHER): Payer: PPO | Admitting: General Surgery

## 2021-05-01 DIAGNOSIS — I87309 Chronic venous hypertension (idiopathic) without complications of unspecified lower extremity: Secondary | ICD-10-CM | POA: Diagnosis not present

## 2021-05-01 DIAGNOSIS — I87311 Chronic venous hypertension (idiopathic) with ulcer of right lower extremity: Secondary | ICD-10-CM | POA: Diagnosis not present

## 2021-05-01 DIAGNOSIS — L97211 Non-pressure chronic ulcer of right calf limited to breakdown of skin: Secondary | ICD-10-CM | POA: Diagnosis not present

## 2021-05-01 DIAGNOSIS — L97818 Non-pressure chronic ulcer of other part of right lower leg with other specified severity: Secondary | ICD-10-CM | POA: Diagnosis not present

## 2021-05-01 NOTE — Progress Notes (Addendum)
[name], [name] ([id number]) ?Visit Report for 05/01/2021 ?Chief Complaint Document Details ?Patient Name: Date of Service: ?[name], [name] 05/01/2021 12:30 PM ?Medical Record Number: [id number] ?Patient Account Number: [id number] ?Date of Birth/Sex: Treating RN: ?November 04, 1945 (76 y.o. M) ?Primary Care Provider: [name], [name]: ?Referring Provider: ?Treating Provider/Extender: [name] ?[name], [name] ?Weeks in Treatment: 5 ?Information Obtained from: Patient ?Chief Complaint ?Patient seen for complaints of Non-Healing Wound. ?Electronic Signature(s) ?Signed: 05/01/2021 12:49:58 PM By: [name] MD FACS ?Entered By: [name] on 05/01/2021 12:49:58 ?-------------------------------------------------------------------------------- ?HPI Details ?Patient Name: Date of Service: ?[name], [name] 05/01/2021 12:30 PM ?Medical Record Number: [id number] ?Patient Account Number: [id number] ?Date of Birth/Sex: Treating RN: ?01-04-1946 (76 y.o. M) ?Primary Care Provider: [name], [name]: ?Referring Provider: ?Treating Provider/Extender: [name] ?[name], [name] ?Weeks in Treatment: 5 ?History of Present Illness ?HPI Description: ADMISSION ?03/21/21 ?This is a 76 year old man with a past medical history notable for COPD and chronic hypoxic respiratory failure, requiring 4 L of oxygen, coronary artery ?disease, hypertension, and bilateral lower extremity chronic edema. On February 9, he struck his right leg with a car door which opened up a wound. It has ?failed to heal since that time. He saw a dermatologist that prescribed MetroGel, with little improvement. He was placed on Keflex and has a few days left of ?this antibiotic. He does wear support stockings on a regular basis, but the compression is fairly low. ABI in clinic today was 1.29. The wound itself has thick ?yellow slough, but no surrounding erythema or induration. No foul odor. He does not have  significant pain. ?03/27/2021: Last week, I took a PCR culture which was positive for Enterococcus faecalis. I initiated him on Augmentin and he is tolerating this well. Today, the ?wound is much cleaner and is coming in size. He did not love the 4-layer compression, but he is willing to tolerate it to get the wound closed. He has been in ?Iodoflex under the compression wrap. ?04/03/2021: Today, he is complaining fairly bitterly about his compression wraps. He says that they are too tight and he feels like his toes go numb from time to ?time. However, the wound is smaller and has significantly less slough. We have been using Iodoflex under the compression wrap. ?04/10/2021: Last week, we changed his dressing to Coflex. He says that although he really does not like the compression wraps, this 1 was more tolerable. The ?wound is much smaller with good epithelial skin coming around the periphery and is very clean. We have been using Iodoflex under the Coflex compression. ?04/17/2021: This past Friday, the patient was concerned at how swollen his knee was and he went to his primary care office. They sent him to the ER for DVT ?scan and prescribed doxycycline for possible cellulitis. In the emergency department, they explained that he was in compression to try and heal his wound and ?minimize the fluid buildup. His DVT scan was negative. The ER provider did not think it looked like he had cellulitis, as he had similar skin changes bilaterally. ?The patient has not filled the doxycycline prescription. His wound was wrapped to just below the tibial tuberosity; his swelling begins just proximal to this, as ?would be expected. His edema control aside from where he was wrapped is quite poor with 4+ pitting edema. He apparently takes only 20 mg of Lasix daily, ?with instructions to increase to double if he has too much fluid, but he has not done so. ?04/24/2021: He took his  wrap off on Saturday in order to undergo nail trimming. His  leg is quite edematous today, but the wound is nearly closed. ?05/01/2021: His wound has healed. ?Electronic Signature(s) ?Signed: 05/01/2021 12:50:21 PM By: [name] MD FACS ?Entered By: [name] on 05/01/2021 12:50:21 ?-------------------------------------------------------------------------------- ?Physical Exam Details ?Patient Name: Date of Service: ?[name], [name] 05/01/2021 12:30 PM ?Medical Record Number: [id number] ?Patient Account Number: [id number] ?Date of Birth/Sex: Treating RN: ?1945/12/08 (76 y.o. M) ?Primary Care Provider: [name], [name]: ?Referring Provider: ?Treating Provider/Extender: [name] ?[name], [name] ?Weeks in Treatment: 5 ?Constitutional ?. Slightly tachycardic, asymptomatic.. . . No acute distress. ?Respiratory ?Normal work of breathing on supplemental oxygen. ?Notes ?05/01/2021: His wound is closed. ?Electronic Signature(s) ?Signed: 05/01/2021 12:52:27 PM By: [name] MD FACS ?Entered By: [name] on 05/01/2021 12:52:27 ?-------------------------------------------------------------------------------- ?Physician Orders Details ?Patient Name: Date of Service: ?[name], [name] 05/01/2021 12:30 PM ?Medical Record Number: [id number] ?Patient Account Number: [id number] ?Date of Birth/Sex: Treating RN: ?1945-12-22 (76 y.o. Janyth Contes ?Primary Care Provider: [name], [name]: ?Referring Provider: ?Treating Provider/Extender: [name] ?[name], [name] ?Weeks in Treatment: 5 ?Verbal / Phone Orders: No ?Diagnosis Coding ?ICD-10 Coding ?Code Description ?L97.211 Non-pressure chronic ulcer of right calf limited to breakdown of skin ?J96.11 Chronic respiratory failure with hypoxia ?J44.9 Chronic obstructive pulmonary disease, unspecified ?Z60.10 Chronic diastolic (congestive) heart failure ?I87.309 Chronic venous hypertension (idiopathic) without complications of unspecified lower extremity ?I25.10  Atherosclerotic heart disease of native coronary artery without angina pectoris ?Discharge From [facility] ?Discharge from [place]!!! ?Edema Control - Lymphedema / SCD / Other ?Elevate legs to the level of the heart or above for 30 minutes daily and/or when sitting, a frequency of: - throughout the day ?Avoid standing for long periods of time. ?Patient to wear own compression stockings every day. ?Exercise regularly ?Moisturize legs daily. ?Electronic Signature(s) ?Signed: 05/01/2021 12:52:55 PM By: [name] MD FACS ?Entered By: [name] on 05/01/2021 12:52:54 ?-------------------------------------------------------------------------------- ?Problem List Details ?Patient Name: ?Date of Service: ?Hollenkamp, [name] 05/01/2021 12:30 PM ?Medical Record Number: [id number] ?Patient Account Number: [id number] ?Date of Birth/Sex: ?Treating RN: ?Apr 07, 1945 (76 y.o. M) ?Primary Care Provider: [name], [name] ?Other Clinician: ?Referring Provider: ?Treating Provider/Extender: [name] ?[name], [name] ?Weeks in Treatment: 5 ?Active Problems ?ICD-10 ?Encounter ?Code Description Active Date MDM ?Diagnosis ?L97.211 Non-pressure chronic ulcer of right calf limited to breakdown of skin 03/21/2021 No Yes ?J96.11 Chronic respiratory failure with hypoxia 03/21/2021 No Yes ?J44.9 Chronic obstructive pulmonary disease, unspecified 03/21/2021 No Yes ?K02.54 Chronic diastolic (congestive) heart failure 03/21/2021 No Yes ?I87.309 Chronic venous hypertension (idiopathic) without complications of unspecified 03/21/2021 No Yes ?lower extremity ?I25.10 Atherosclerotic heart disease of native coronary artery without angina pectoris 03/21/2021 No Yes ?Inactive Problems ?Resolved Problems ?Electronic Signature(s) ?Signed: 05/01/2021 12:49:40 PM By: [name] MD FACS ?Entered By: [name] on 05/01/2021  12:49:40 ?-------------------------------------------------------------------------------- ?Progress Note Details ?Patient Name: ?Date of Service: ?Albino, [name] 05/01/2021 12:30 PM ?Medical Record Number: [id number] ?Patient Account Number: [id number] ?Date of Birth/Sex: ?Treating RN: ?[birth year]

## 2021-05-01 NOTE — Progress Notes (Signed)
Freel, Daltyn J. ([id number]) ?Visit Report for 05/01/2021 ?Arrival Information Details ?Patient Name: Date of Service: ?Roca, [name] 05/01/2021 12:30 PM ?Medical Record Number: [id number] ?Patient Account Number: [id number] ?Date of Birth/Sex: Treating RN: ?1945/06/28 (76 y.o. Janyth Contes ?Primary Care Menucha Dicesare: [name], [name]: ?Referring Reida Hem: ?Treating Raiven Belizaire/Extender: [name] ?[name], [name] ?Weeks in Treatment: 5 ?Visit Information History Since Last Visit ?Added or deleted any medications: No ?Patient Arrived: [name] ?Any new allergies or adverse reactions: No ?Arrival Time: 12:36 ?Had a fall or experienced change in No ?Accompanied By: self ?activities of daily living that may affect ?Transfer Assistance: None ?risk of falls: ?Patient Identification Verified: Yes ?Signs or symptoms of abuse/neglect since last visito No ?Secondary Verification Process Completed: Yes ?Hospitalized since last visit: No ?Patient Requires Transmission-Based Precautions: No ?Implantable device outside of the clinic excluding No ?Patient Has Alerts: No ?cellular tissue based products placed in the center ?since last visit: ?Has Dressing in Place as Prescribed: Yes ?Has Compression in Place as Prescribed: Yes ?Pain Present Now: No ?Electronic Signature(s) ?Signed: 05/01/2021 4:50:41 PM By: Adline Peals ?Entered By: Adline Peals on 05/01/2021 12:38:25 ?-------------------------------------------------------------------------------- ?Clinic Level of Care Assessment Details ?Patient Name: Date of Service: ?Esteve, [name] 05/01/2021 12:30 PM ?Medical Record Number: [id number] ?Patient Account Number: [id number] ?Date of Birth/Sex: Treating RN: ?1945/06/22 (76 y.o. Janyth Contes ?Primary Care Mashonda Broski: [name], [name]: ?Referring Daisia Slomski: ?Treating Carleena Mires/Extender: [name] ?[name], [name] ?Weeks in Treatment: 5 ?Clinic Level of Care  Assessment Items ?TOOL 4 Quantity Score ?X- 1 0 ?Use when only an EandM is performed on FOLLOW-UP visit ?ASSESSMENTS - Nursing Assessment / Reassessment ?X- 1 10 ?Reassessment of Co-morbidities (includes updates in patient status) ?X- 1 5 ?Reassessment of Adherence to Treatment Plan ?ASSESSMENTS - Wound and Skin A ssessment / Reassessment ?X - Simple Wound Assessment / Reassessment - one wound 1 5 ?'[]'$  - 0 ?Complex Wound Assessment / Reassessment - multiple wounds ?'[]'$  - 0 ?Dermatologic / Skin Assessment (not related to wound area) ?ASSESSMENTS - Focused Assessment ?X- 1 5 ?Circumferential Edema Measurements - multi extremities ?'[]'$  - 0 ?Nutritional Assessment / Counseling / Intervention ?X- 1 5 ?Lower Extremity Assessment (monofilament, tuning fork, pulses) ?'[]'$  - 0 ?Peripheral Arterial Disease Assessment (using hand held doppler) ?ASSESSMENTS - Ostomy and/or Continence Assessment and Care ?'[]'$  - 0 ?Incontinence Assessment and Management ?'[]'$  - 0 ?Ostomy Care Assessment and Management (repouching, etc.) ?PROCESS - Coordination of Care ?X - Simple Patient / Family Education for ongoing care 1 15 ?'[]'$  - 0 ?Complex (extensive) Patient / Family Education for ongoing care ?X- 1 10 ?Staff obtains Consents, Records, T Results / Process Orders ?est ?'[]'$  - 0 ?Staff telephones HHA, Nursing Homes / Clarify orders / etc ?'[]'$  - 0 ?Routine Transfer to another Facility (non-emergent condition) ?'[]'$  - 0 ?Routine Hospital Admission (non-emergent condition) ?'[]'$  - 0 ?New Admissions / Biomedical engineer / Ordering NPWT Apligraf, etc. ?, ?'[]'$  - 0 ?Emergency Hospital Admission (emergent condition) ?X- 1 10 ?Simple Discharge Coordination ?'[]'$  - 0 ?Complex (extensive) Discharge Coordination ?PROCESS - Special Needs ?'[]'$  - 0 ?Pediatric / Minor Patient Management ?'[]'$  - 0 ?Isolation Patient Management ?'[]'$  - 0 ?Hearing / Language / Visual special needs ?'[]'$  - 0 ?Assessment of Community assistance (transportation, D/C planning, etc.) ?'[]'$  -  0 ?Additional assistance / Altered mentation ?'[]'$  - 0 ?Support Surface(s) Assessment (bed, cushion, seat, etc.) ?INTERVENTIONS - Wound Cleansing / Measurement ?X - Simple Wound Cleansing - one wound 1 5 ?'[]'$  - 0 ?Complex  Wound Cleansing - multiple wounds ?X- 1 5 ?Wound Imaging (photographs - any number of wounds) ?'[]'$  - 0 ?Wound Tracing (instead of photographs) ?'[]'$  - 0 ?Simple Wound Measurement - one wound ?'[]'$  - 0 ?Complex Wound Measurement - multiple wounds ?INTERVENTIONS - Wound Dressings ?'[]'$  - 0 ?Small Wound Dressing one or multiple wounds ?'[]'$  - 0 ?Medium Wound Dressing one or multiple wounds ?'[]'$  - 0 ?Large Wound Dressing one or multiple wounds ?'[]'$  - 0 ?Application of Medications - topical ?'[]'$  - 0 ?Application of Medications - injection ?INTERVENTIONS - Miscellaneous ?'[]'$  - 0 ?External ear exam ?'[]'$  - 0 ?Specimen Collection (cultures, biopsies, blood, body fluids, etc.) ?'[]'$  - 0 ?Specimen(s) / Culture(s) sent or taken to Lab for analysis ?'[]'$  - 0 ?Patient Transfer (multiple staff / Civil Service fast streamer / Similar devices) ?'[]'$  - 0 ?Simple Staple / Suture removal (25 or less) ?'[]'$  - 0 ?Complex Staple / Suture removal (26 or more) ?'[]'$  - 0 ?Hypo / Hyperglycemic Management (close monitor of Blood Glucose) ?'[]'$  - 0 ?Ankle / Brachial Index (ABI) - do not check if billed separately ?X- 1 5 ?Vital Signs ?Has the patient been seen at the hospital within the last three years: Yes ?Total Score: 80 ?Level Of Care: New/Established - Level 3 ?Electronic Signature(s) ?Signed: 05/01/2021 4:50:41 PM By: Adline Peals ?Entered By: Adline Peals on 05/01/2021 12:52:27 ?-------------------------------------------------------------------------------- ?Lower Extremity Assessment Details ?Patient Name: Date of Service: ?Vasseur, [name] 05/01/2021 12:30 PM ?Medical Record Number: [id number] ?Patient Account Number: [id number] ?Date of Birth/Sex: Treating RN: ?1945-06-11 (76 y.o. Janyth Contes ?Primary Care Iyauna Sing: [name], [name]  Clinician: ?Referring Oaklynn Stierwalt: ?Treating Janece Laidlaw/Extender: [name] ?[name], [name] ?Weeks in Treatment: 5 ?Edema Assessment ?Assessed: [Left: No] [Right: No] ?Edema: [Left: Ye] [Right: s] ?Calf ?Left: Right: ?Point of Measurement: 31 cm From Medial Instep 44.1 cm ?Ankle ?Left: Right: ?Point of Measurement: 13 cm From Medial Instep 31.2 cm ?Vascular Assessment ?Pulses: ?Dorsalis Pedis ?Palpable: [Right:Yes] ?Electronic Signature(s) ?Signed: 05/01/2021 4:50:41 PM By: Adline Peals ?Entered By: Adline Peals on 05/01/2021 12:42:14 ?-------------------------------------------------------------------------------- ?Multi Wound Chart Details ?Patient Name: Date of Service: ?Albee, [name] 05/01/2021 12:30 PM ?Medical Record Number: [id number] ?Patient Account Number: [id number] ?Date of Birth/Sex: Treating RN: ?1945-06-26 (76 y.o. M) ?Primary Care Kross Swallows: [name], [name]: ?Referring Talina Pleitez: ?Treating Roland Prine/Extender: [name] ?[name], [name] ?Weeks in Treatment: 5 ?Vital Signs ?Height(in): 71 ?Pulse(bpm): 104 ?Weight(lbs): 238 ?Blood Pressure(mmHg): 136/62 ?Body Mass Index(BMI): 33.2 ?Temperature(??F): 98 ?Respiratory Rate(breaths/min): 20 ?Photos: [N/A:N/A] ?Right, Anterior Lower Leg N/A N/A ?Wound Location: ?Trauma N/A N/A ?Wounding Event: ?Trauma, Other N/A N/A ?Primary Etiology: ?Chronic Obstructive Pulmonary N/A N/A ?Comorbid History: ?Disease (COPD), Coronary Artery ?Disease, Hypertension, Neuropathy ?02/16/2021 N/A N/A ?Date Acquired: ?5 N/A N/A ?Weeks of Treatment: ?Healed - Epithelialized N/A N/A ?Wound Status: ?No N/A N/A ?Wound Recurrence: ?0x0x0 N/A N/A ?Measurements L x W x D (cm) ?0 N/A N/A ?A (cm?) : ?rea ?0 N/A N/A ?Volume (cm?) : ?100.00% N/A N/A ?% Reduction in Area: ?100.00% N/A N/A ?% Reduction in Volume: ?Full Thickness Without Exposed N/A N/A ?Classification: ?Support Structures ?None Present N/A N/A ?Exudate Amount: ?Distinct, outline attached N/A  N/A ?Wound Margin: ?None Present (0%) N/A N/A ?Granulation Amount: ?None Present (0%) N/A N/A ?Necrotic Amount: ?Fascia: No N/A N/A ?Exposed Structures: ?Fat Layer (Subcutaneous Tissue): No ?Tendon: No ?Muscle: No ?Denice Paradise

## 2021-06-21 ENCOUNTER — Encounter: Payer: Self-pay | Admitting: Primary Care

## 2021-06-21 ENCOUNTER — Ambulatory Visit (INDEPENDENT_AMBULATORY_CARE_PROVIDER_SITE_OTHER): Payer: PPO | Admitting: Primary Care

## 2021-06-21 VITALS — BP 134/68 | HR 108 | Temp 97.6°F | Ht 71.0 in | Wt 244.0 lb

## 2021-06-21 DIAGNOSIS — R079 Chest pain, unspecified: Secondary | ICD-10-CM

## 2021-06-21 DIAGNOSIS — Z87891 Personal history of nicotine dependence: Secondary | ICD-10-CM | POA: Diagnosis not present

## 2021-06-21 DIAGNOSIS — J449 Chronic obstructive pulmonary disease, unspecified: Secondary | ICD-10-CM

## 2021-06-21 DIAGNOSIS — J9611 Chronic respiratory failure with hypoxia: Secondary | ICD-10-CM | POA: Diagnosis not present

## 2021-06-21 DIAGNOSIS — R0602 Shortness of breath: Secondary | ICD-10-CM

## 2021-06-21 LAB — COMPREHENSIVE METABOLIC PANEL
ALT: 18 U/L (ref 0–53)
AST: 26 U/L (ref 0–37)
Albumin: 3.8 g/dL (ref 3.5–5.2)
Alkaline Phosphatase: 143 U/L — ABNORMAL HIGH (ref 39–117)
BUN: 6 mg/dL (ref 6–23)
CO2: 32 mEq/L (ref 19–32)
Calcium: 9.5 mg/dL (ref 8.4–10.5)
Chloride: 91 mEq/L — ABNORMAL LOW (ref 96–112)
Creatinine, Ser: 0.64 mg/dL (ref 0.40–1.50)
GFR: 93.78 mL/min (ref >60.00)
Glucose, Bld: 107 mg/dL — ABNORMAL HIGH (ref 70–99)
Potassium: 3.7 mEq/L (ref 3.5–5.1)
Sodium: 132 mEq/L — ABNORMAL LOW (ref 135–145)
Total Bilirubin: 1.3 mg/dL — ABNORMAL HIGH (ref 0.2–1.2)
Total Protein: 7.7 g/dL (ref 6.0–8.3)

## 2021-06-21 LAB — D-DIMER, QUANTITATIVE: D-Dimer, Quant: 0.89 mcg/mL FEU — ABNORMAL HIGH (ref <0.50)

## 2021-06-21 LAB — BRAIN NATRIURETIC PEPTIDE: Pro B Natriuretic peptide (BNP): 28 pg/mL (ref 0.0–100.0)

## 2021-06-21 NOTE — Assessment & Plan Note (Addendum)
-   Qualified for oxygen in March 2021 on 6MWT. Maintained on supplemental oxygen 3-4L. He does not consistently wear oxygen at night or with exertion and O2 levels will drop into the 70s. Reinforced importance of wearing oxygen at all times. POC appears not to be working properly, patient will contact DME to have this looked at. He will also return to pulmonary rehab and we have placed an order for patient to be provided with incentive spirometer. Adding humidification to oxygen d.t dry mouth.

## 2021-06-21 NOTE — Progress Notes (Signed)
$'@Patient'q$  ID: [name] [name], male    DOB: [birth year], 76 y.o.   MRN: [id number]  Chief Complaint  Patient presents with   Follow-up    Breathing worse x 2 wks. He c/o dry throat from supplemental o2 use. He is using rescue inhaler once per day on average.     Referring provider: Lujean Amel, MD  HPI: 76 year old male, former smoker quit in 2010 (80-pack-year history).  Past medical history significant for COPD.  Patient's been on portable oxygen since March 2021.  Completed pulmonary rehab in 2020.  Previous [facility] pulmonary encounter: 94-monthfollow-up visit He is able to obtain Breztri through the company but for some reason has run out over the past week and requests a sample. He feels like he is using oxygen more constantly now, uses 2 L continuous at home and uses POC 3 to 4 L when he goes out.  He is not good about using nocturnal oxygen and feels uncomfortable with nasal cannula in his nose.  I reviewed nocturnal oximetry from July.  Weight has increased 5 pounds over the past year to his current weight of 236 pounds he reports 1+ bipedal edema in spite of taking Lasix 20 mg daily.  Also note that he is on amlodipine I reviewed low-dose CT chest and last x-ray   06/21/2021- interim hx  Patient presents today for a 621-monthollow-up. Accompanied by his daughter. He feels his breathing has been progressively getting worse. No acute associated respiratory symptoms. He reports dry mouth/throat from oxygen use.  He uses his Breztri twice daily as prescribed, not consistently rinsing his mouth after use.  He uses his rescue inhaler 1-2 times a day. He is mostly compliant with oxygen. He will at times go without oxygen when at home and walking to the kitchen. On room air with exertion his O2 will drop into the 70s. O2 readings at home have been running between 90-94% on 2-2.5L at rest. He has done pulmonary rehab in the past, completed in 2020. He is open to returning to pulmonary  rehab, he is concerned about getting to his visit. He is taking lasix '20mg'$  daily, occasionally twice a day as needed for weight gain/leg swelling.Notes his HR has been more elevated last several days. No chest pain.    Significant tests/ events reviewed  07/2020 ONO /RA -desaturation less than 88% for 23 minutes  LDCT 07/05/20 showed lung RADS 2. Centrilobular and paraseptal emphysema , unchanged nodules compared to 2020   PFTs 09/2012 moderate reversible airway obstruction, ratio 49, FEV1 44%, improved to 56%/25% bronchodilator response, TLC 88%, DLCO 55%   Spirometry 03/18/2018 showed ratio of 61, FEV1 of 43% and FVC of 52%    Allergies  Allergen Reactions   Fluticasone-Salmeterol     Other reaction(s): worsening shortness of breath    Immunization History  Administered Date(s) Administered   Influenza Split 01/14/2012, 11/26/2012, 10/11/2020   Influenza,inj,Quad PF,6+ Mos 09/30/2015, 10/05/2016, 10/23/2017, 11/05/2018, 11/26/2019   Influenza,inj,Quad PF,6-35 Mos 11/09/2018   Influenza,inj,quad, With Preservative 09/18/2013, 09/27/2014   PFIZER(Purple Top)SARS-COV-2 Vaccination 02/13/2019, 03/06/2019, 03/23/2019, 04/23/2019   Pneumococcal Conjugate-13 09/18/2013   Pneumococcal Polysaccharide-23 09/27/2014   Tdap 10/24/2010   Zoster, Live 10/24/2010    Past Medical History:  Diagnosis Date   COPD (chronic obstructive pulmonary disease) ([facility]    Tobacco History: Social History   Tobacco Use  Smoking Status Former   Packs/day: 2.00   Years: 40.00   Total pack years: 80.00   Types:  Cigarettes   Quit date: 2010   Years since quitting: 13.4  Smokeless Tobacco Never   Counseling given: Not Answered   Outpatient Medications Prior to Visit  Medication Sig Dispense Refill   albuterol (VENTOLIN HFA) 108 (90 Base) MCG/ACT inhaler Inhale 2 puffs into the lungs every 6 (six) hours as needed for wheezing or shortness of breath. 8 g 5   ALPRAZolam (XANAX) 0.25 MG tablet Take  1 tablet (0.25 mg total) by mouth daily as needed. 30 tablet 0   amLODipine (NORVASC) 5 MG tablet Take 5 mg by mouth daily.     atorvastatin (LIPITOR) 40 MG tablet Take 40 mg by mouth daily. Pt takes two twice daily     Budeson-Glycopyrrol-Formoterol (BREZTRI AEROSPHERE) 160-9-4.8 MCG/ACT AERO Inhale 2 puffs into the lungs in the morning and at bedtime. 10.7 g 11   furosemide (LASIX) 20 MG tablet Take 20 mg by mouth daily.     ibuprofen (ADVIL,MOTRIN) 200 MG tablet Take 400 mg by mouth every 6 (six) hours as needed.     Multiple Vitamins-Minerals (MULTIVITAMIN WITH MINERALS) tablet Take 1 tablet by mouth daily.     vitamin B-12 (CYANOCOBALAMIN) 1000 MCG tablet Take 1,000 mcg by mouth.     Budeson-Glycopyrrol-Formoterol (BREZTRI AEROSPHERE) 160-9-4.8 MCG/ACT AERO Inhale 2 puffs into the lungs in the morning and at bedtime. 10.7 g 0   No facility-administered medications prior to visit.    Review of Systems  Review of Systems  Constitutional: Negative.   HENT: Negative.    Respiratory:  Positive for shortness of breath. Negative for cough, chest tightness and wheezing.   Cardiovascular:  Positive for leg swelling. Negative for chest pain.     Physical Exam  BP 134/68 (BP Location: Left Arm, Cuff Size: Normal)   Pulse (!) 108   Temp 97.6 F (36.4 C) (Oral)   Ht '5\' 11"'$  (1.803 m)   Wt 244 lb (110.7 kg)   SpO2 90% Comment: on RA  BMI 34.03 kg/m  Physical Exam Constitutional:      Appearance: Normal appearance. He is obese.  HENT:     Head: Normocephalic and atraumatic.     Mouth/Throat:     Mouth: Mucous membranes are moist.     Pharynx: Oropharynx is clear.  Cardiovascular:     Rate and Rhythm: Normal rate.     Comments: + BLE edema Pulmonary:     Effort: Pulmonary effort is normal.     Breath sounds: No wheezing, rhonchi or rales.     Comments: Diminished Musculoskeletal:     Cervical back: Normal range of motion and neck supple.  Neurological:     General: No focal  deficit present.     Mental Status: He is alert and oriented to person, place, and time. Mental status is at baseline.  Psychiatric:        Mood and Affect: Mood normal.        Behavior: Behavior normal.        Thought Content: Thought content normal.        Judgment: Judgment normal.      Lab Results:  CBC No results found for: "WBC", "RBC", "HGB", "HCT", "PLT", "MCV", "MCH", "MCHC", "RDW", "LYMPHSABS", "MONOABS", "EOSABS", "BASOSABS"  BMET    Component Value Date/Time   NA 130 (L) 07/01/2020 1036   NA 133 (L) 08/28/2019 1056   K 4.2 07/01/2020 1036   CL 93 (L) 07/01/2020 1036   CO2 27 07/01/2020 1036   GLUCOSE 110 (H)  07/01/2020 1036   BUN 7 07/01/2020 1036   BUN 11 08/28/2019 1056   CREATININE 0.62 07/01/2020 1036   CALCIUM 9.5 07/01/2020 1036   GFRNONAA 91 08/28/2019 1056   GFRAA 105 08/28/2019 1056    BNP No results found for: "BNP"  ProBNP    Component Value Date/Time   PROBNP 23.0 06/24/2020 1130    Imaging: No results found.   Assessment & Plan:   COPD with chronic bronchitis and emphysema ([place]) - Patient feels his breathing has worsened last several months. He has no acute respiratory symptoms. He is compliant with [employer] and uses with spacer twice daily. Requires SABA 1-2 times a day. Dyspnea likely d/t deconditioning. He has been to pulmonary rehab in the past and is open to returning. We will check d-dimer d.t elevated HR and BNP d/t leg swelling.   Chronic respiratory failure with hypoxia [facility]) - Qualified for oxygen in March 2021 on 6MWT. Maintained on supplemental oxygen 3-4L. He does not consistently wear oxygen at night or with exertion and O2 levels will drop into the 70s. Reinforced importance of wearing oxygen at all times. POC appears not to be working properly, patient will contact DME to have this looked at. He will also return to pulmonary rehab and we have placed an order for patient to be provided with incentive spirometer.  Adding humidification to oxygen d.t dry mouth.   Former smoker - Due for LDCT end of June 2023   Martyn Ehrich, NP 06/21/2021

## 2021-06-21 NOTE — Addendum Note (Signed)
Addended by: Martyn Ehrich on: 06/21/2021 05:54 PM   Modules accepted: Orders

## 2021-06-21 NOTE — Assessment & Plan Note (Signed)
-   Patient feels his breathing has worsened last several months. He has no acute respiratory symptoms. He is compliant with [employer] and uses with spacer twice daily. Requires SABA 1-2 times a day. Dyspnea likely d/t deconditioning. He has been to pulmonary rehab in the past and is open to returning. We will check d-dimer d.t elevated HR and BNP d/t leg swelling.

## 2021-06-21 NOTE — Assessment & Plan Note (Signed)
-   Due for LDCT end of June 2023

## 2021-06-21 NOTE — Progress Notes (Signed)
Patient needs CTA to rule out PE, ill order. Also please let him know his liver enzymes were elevated, needs to see PCP. If he can not get a visit with them I will likely need to order Abd ultrasound

## 2021-06-21 NOTE — Patient Instructions (Addendum)
  Recommendation: - Continue to use Breztri two puffs morning and evening (with spacer) - Continue to use your oxygen consistently with exertion and at night  - Continue lasix '20mg'$  daily, take additional afternoon dose as needed for leg swelling  - We will add humidification to oxygen  - Try biotene mouth wash over the counter  - Keep lozenges with you and take sips of water to help with dry mouth - Please contact DME company to have them service POC machine, looks like it could be malfunctioning  Referral: - Pulmonary rehab (*please order- pt would prefer not to go to [place] but can)  Orders: - Labs today (ordered) - Needs new spacer (ordered) - Simple walk test on POC to ensure patient can keep o2 >88-90% (done) - Add humidification to oxygen (*please order)  Follow-up: - 3 months with Dr. [name] or sooner if needed

## 2021-06-22 ENCOUNTER — Encounter (HOSPITAL_BASED_OUTPATIENT_CLINIC_OR_DEPARTMENT_OTHER): Payer: Self-pay

## 2021-06-22 ENCOUNTER — Inpatient Hospital Stay: Admission: RE | Admit: 2021-06-22 | Payer: PPO | Source: Ambulatory Visit

## 2021-06-22 ENCOUNTER — Telehealth: Payer: Self-pay | Admitting: Primary Care

## 2021-06-22 ENCOUNTER — Ambulatory Visit (HOSPITAL_BASED_OUTPATIENT_CLINIC_OR_DEPARTMENT_OTHER)
Admission: RE | Admit: 2021-06-22 | Discharge: 2021-06-22 | Disposition: A | Discharge status: Home / Self Care | Payer: PPO | Source: Ambulatory Visit | Attending: Primary Care | Admitting: Primary Care

## 2021-06-22 DIAGNOSIS — R079 Chest pain, unspecified: Secondary | ICD-10-CM | POA: Diagnosis not present

## 2021-06-22 DIAGNOSIS — R7989 Other specified abnormal findings of blood chemistry: Secondary | ICD-10-CM | POA: Diagnosis not present

## 2021-06-22 MED ORDER — IOHEXOL 350 MG/ML SOLN
100.0000 mL | Freq: Once | INTRAVENOUS | Status: AC | PRN
  Administered 2021-06-22: 75 mL via INTRAVENOUS

## 2021-06-22 NOTE — Progress Notes (Signed)
CT negative for PE. Chronic lung disease with emphysema and scarring. No pneumonia.

## 2021-06-22 NOTE — Telephone Encounter (Signed)
Pt had CTa performed 6/15 and has been made aware of the results. Nothing further needed.

## 2021-06-22 NOTE — Telephone Encounter (Signed)
I called patient twice to review results, no answer. Left message. D-dimer was positive, needs CTA to ensure no pulmonary embolism d/t tachycardia and increased O2 requirements. See results note

## 2021-06-23 ENCOUNTER — Telehealth: Payer: Self-pay | Admitting: Primary Care

## 2021-06-23 DIAGNOSIS — Z122 Encounter for screening for malignant neoplasm of respiratory organs: Secondary | ICD-10-CM

## 2021-06-23 DIAGNOSIS — Z87891 Personal history of nicotine dependence: Secondary | ICD-10-CM

## 2021-06-23 NOTE — Telephone Encounter (Signed)
Spoke with the pt  He is asking what o2 sats should be at  I advised the goal is to keep it aboe 90% ra He verbalized understanding  Nothing further needed

## 2021-06-27 NOTE — Telephone Encounter (Signed)
Pt has called me today and asking about the lung cancer screening CT that is still scheduled for 6/27.  He had a CT angio on 6/15 and BW told him at that time he may could cancel the LCS CT and she would let him know.  Can this be cancelled?

## 2021-06-28 NOTE — Telephone Encounter (Signed)
We can cancel LDCT and schedule 1 year out from CTA. Calcified lung nodules have not changed size in 1 year.

## 2021-06-29 NOTE — Telephone Encounter (Signed)
New CT order placed for lung screening CT to be done 06/23/2022.

## 2021-06-29 NOTE — Addendum Note (Signed)
Addended by: Doroteo Glassman D on: 06/29/2021 12:54 PM   Modules accepted: Orders

## 2021-07-04 ENCOUNTER — Ambulatory Visit (HOSPITAL_BASED_OUTPATIENT_CLINIC_OR_DEPARTMENT_OTHER): Payer: PPO

## 2021-07-06 ENCOUNTER — Telehealth: Payer: Self-pay | Admitting: Primary Care

## 2021-07-06 NOTE — Telephone Encounter (Signed)
LOV 06/21/21  Called patient back and he stated at his office visit Beth asked him if he had been limping and at that time he had no issues. But now he is limping and its his left leg. He wants to know if this should be any concern   Please advise Beth

## 2021-07-07 NOTE — Telephone Encounter (Signed)
He had some leg swelling on exam but that should not necessarily cause a limp. Should be elevated by primary care

## 2021-07-07 NOTE — Telephone Encounter (Signed)
Called and spoke with pt letting him know the info per BW and he verbalized understanding. Nothing further needed. 

## 2021-07-10 ENCOUNTER — Telehealth (HOSPITAL_COMMUNITY): Payer: Self-pay

## 2021-07-10 NOTE — Telephone Encounter (Signed)
Office referral recv'ed, printed and given to RN for review.

## 2021-07-10 NOTE — Telephone Encounter (Signed)
Called patient to see if he is interested in the Pulmonary Rehab Program. Patient expressed interest. Explained scheduling process and went over insurance, patient verbalized understanding. Someone from our pulmonary rehab staff will contact pt at a later time. 

## 2021-07-20 DIAGNOSIS — I10 Essential (primary) hypertension: Secondary | ICD-10-CM | POA: Diagnosis not present

## 2021-07-20 DIAGNOSIS — F419 Anxiety disorder, unspecified: Secondary | ICD-10-CM | POA: Diagnosis not present

## 2021-07-20 DIAGNOSIS — Z79899 Other long term (current) drug therapy: Secondary | ICD-10-CM | POA: Diagnosis not present

## 2021-07-20 DIAGNOSIS — I2510 Atherosclerotic heart disease of native coronary artery without angina pectoris: Secondary | ICD-10-CM | POA: Diagnosis not present

## 2021-07-20 DIAGNOSIS — E7800 Pure hypercholesterolemia, unspecified: Secondary | ICD-10-CM | POA: Diagnosis not present

## 2021-07-27 DIAGNOSIS — Z9981 Dependence on supplemental oxygen: Secondary | ICD-10-CM | POA: Diagnosis not present

## 2021-07-27 DIAGNOSIS — E876 Hypokalemia: Secondary | ICD-10-CM | POA: Diagnosis not present

## 2021-07-27 DIAGNOSIS — R600 Localized edema: Secondary | ICD-10-CM | POA: Diagnosis not present

## 2021-07-27 DIAGNOSIS — L03116 Cellulitis of left lower limb: Secondary | ICD-10-CM | POA: Diagnosis not present

## 2021-07-27 DIAGNOSIS — Z7409 Other reduced mobility: Secondary | ICD-10-CM | POA: Diagnosis not present

## 2021-07-31 DIAGNOSIS — J449 Chronic obstructive pulmonary disease, unspecified: Secondary | ICD-10-CM | POA: Diagnosis not present

## 2021-07-31 DIAGNOSIS — J42 Unspecified chronic bronchitis: Secondary | ICD-10-CM | POA: Diagnosis not present

## 2021-07-31 DIAGNOSIS — J439 Emphysema, unspecified: Secondary | ICD-10-CM | POA: Diagnosis not present

## 2021-07-31 DIAGNOSIS — J9611 Chronic respiratory failure with hypoxia: Secondary | ICD-10-CM | POA: Diagnosis not present

## 2021-08-03 DIAGNOSIS — R609 Edema, unspecified: Secondary | ICD-10-CM | POA: Diagnosis not present

## 2021-08-03 DIAGNOSIS — L03116 Cellulitis of left lower limb: Secondary | ICD-10-CM | POA: Diagnosis not present

## 2021-08-03 DIAGNOSIS — E876 Hypokalemia: Secondary | ICD-10-CM | POA: Diagnosis not present

## 2021-08-03 DIAGNOSIS — D72829 Elevated white blood cell count, unspecified: Secondary | ICD-10-CM | POA: Diagnosis not present

## 2021-08-03 DIAGNOSIS — R5383 Other fatigue: Secondary | ICD-10-CM | POA: Diagnosis not present

## 2021-08-04 DIAGNOSIS — L03116 Cellulitis of left lower limb: Secondary | ICD-10-CM | POA: Diagnosis not present

## 2021-08-04 DIAGNOSIS — J449 Chronic obstructive pulmonary disease, unspecified: Secondary | ICD-10-CM | POA: Diagnosis not present

## 2021-08-04 DIAGNOSIS — Z7409 Other reduced mobility: Secondary | ICD-10-CM | POA: Diagnosis not present

## 2021-08-04 DIAGNOSIS — Z4800 Encounter for change or removal of nonsurgical wound dressing: Secondary | ICD-10-CM | POA: Diagnosis not present

## 2021-08-04 DIAGNOSIS — Z9981 Dependence on supplemental oxygen: Secondary | ICD-10-CM | POA: Diagnosis not present

## 2021-08-04 DIAGNOSIS — E876 Hypokalemia: Secondary | ICD-10-CM | POA: Diagnosis not present

## 2021-08-04 DIAGNOSIS — Z87891 Personal history of nicotine dependence: Secondary | ICD-10-CM | POA: Diagnosis not present

## 2021-08-07 ENCOUNTER — Telehealth (HOSPITAL_COMMUNITY): Payer: Self-pay

## 2021-08-07 NOTE — Telephone Encounter (Signed)
Pt stated that he has a lot going on right now and he will call back at a later time to schedule pulmonary rehab. Closed referral.

## 2021-08-08 DIAGNOSIS — Z9981 Dependence on supplemental oxygen: Secondary | ICD-10-CM | POA: Diagnosis not present

## 2021-08-08 DIAGNOSIS — L03116 Cellulitis of left lower limb: Secondary | ICD-10-CM | POA: Diagnosis not present

## 2021-08-08 DIAGNOSIS — J449 Chronic obstructive pulmonary disease, unspecified: Secondary | ICD-10-CM | POA: Diagnosis not present

## 2021-08-08 DIAGNOSIS — Z87891 Personal history of nicotine dependence: Secondary | ICD-10-CM | POA: Diagnosis not present

## 2021-08-08 DIAGNOSIS — Z4800 Encounter for change or removal of nonsurgical wound dressing: Secondary | ICD-10-CM | POA: Diagnosis not present

## 2021-08-08 DIAGNOSIS — Z7409 Other reduced mobility: Secondary | ICD-10-CM | POA: Diagnosis not present

## 2021-08-08 DIAGNOSIS — E876 Hypokalemia: Secondary | ICD-10-CM | POA: Diagnosis not present

## 2021-08-31 DIAGNOSIS — J439 Emphysema, unspecified: Secondary | ICD-10-CM | POA: Diagnosis not present

## 2021-08-31 DIAGNOSIS — J449 Chronic obstructive pulmonary disease, unspecified: Secondary | ICD-10-CM | POA: Diagnosis not present

## 2021-08-31 DIAGNOSIS — J9611 Chronic respiratory failure with hypoxia: Secondary | ICD-10-CM | POA: Diagnosis not present

## 2021-08-31 DIAGNOSIS — J42 Unspecified chronic bronchitis: Secondary | ICD-10-CM | POA: Diagnosis not present

## 2021-09-03 IMAGING — CT CT CHEST LUNG CANCER SCREENING LOW DOSE W/O CM
2 of 4 series · 14 of 36 positions shown, 17 images · non-contrast
Comparison: Low-dose lung cancer screening chest CT 07/02/2018.

CLINICAL DATA: 73-year-old male former smoker (quit in 6821) with
80 pack-year history of smoking. Lung cancer screening examination.

EXAM:
CT CHEST WITHOUT CONTRAST LOW-DOSE FOR LUNG CANCER SCREENING
TECHNIQUE: Multidetector CT imaging of the chest was performed following the
standard protocol without IV contrast.

[Series 3: lung thins 1.0 · axial · 0.76mm/px · z∈[-336,-71]mm · 11 of 293 slices shown, 14 images]
[im 14/293  mediastinal]
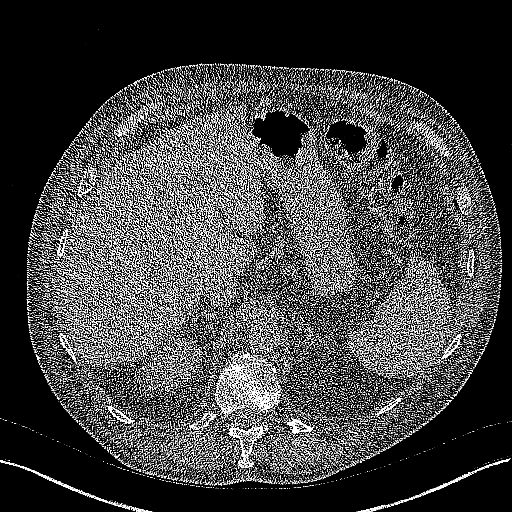
[im 14/293  lung]
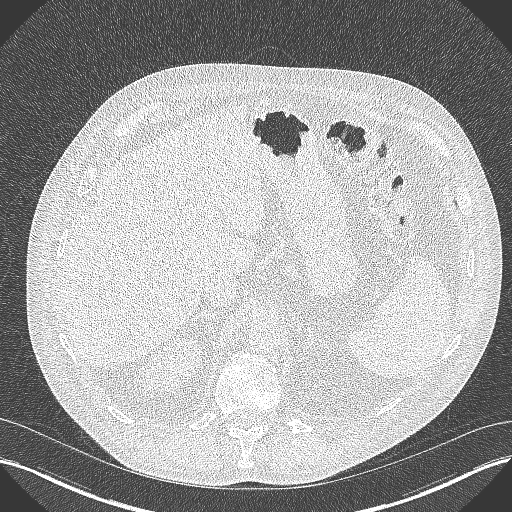
[im 40/293  lung]
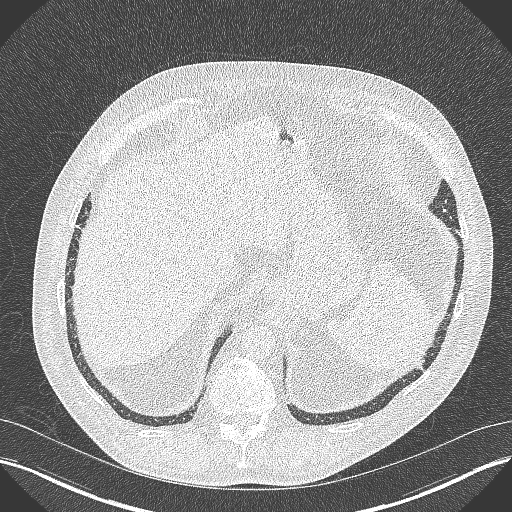
[im 67/293  lung]
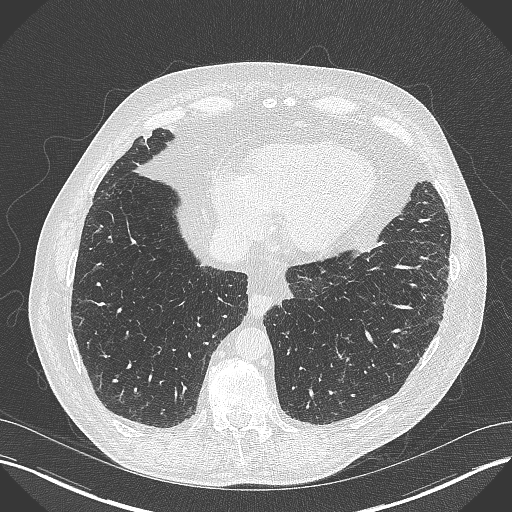
[im 93/293  lung]
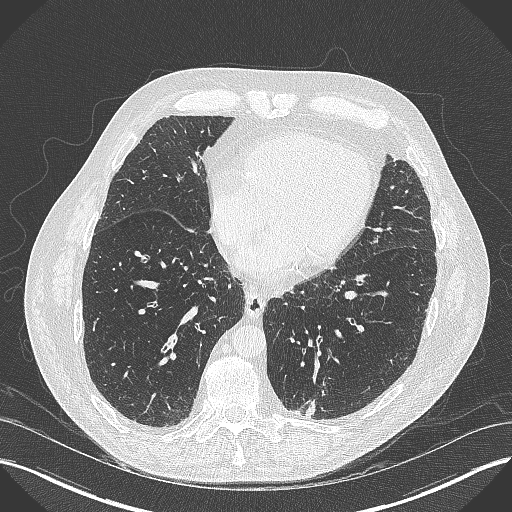
[im 120/293  mediastinal]
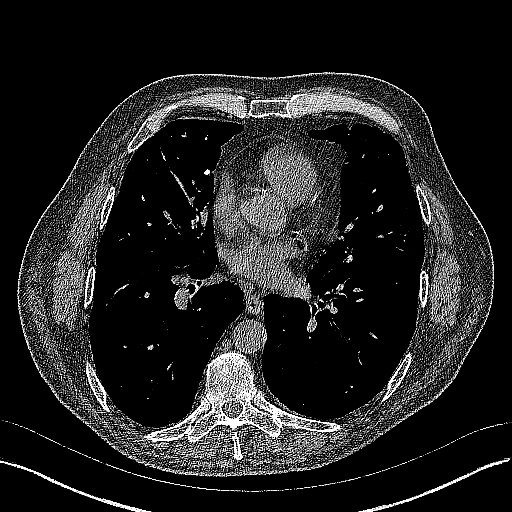
[im 120/293  lung]
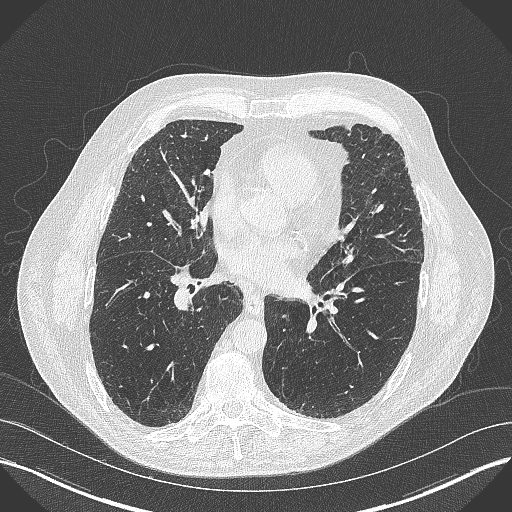
[im 147/293  lung]
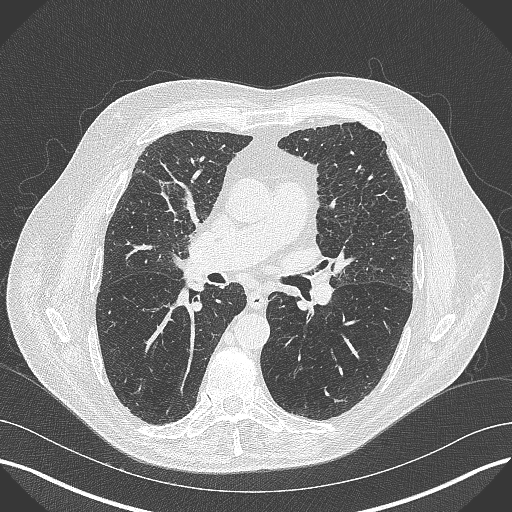
[im 173/293  lung]
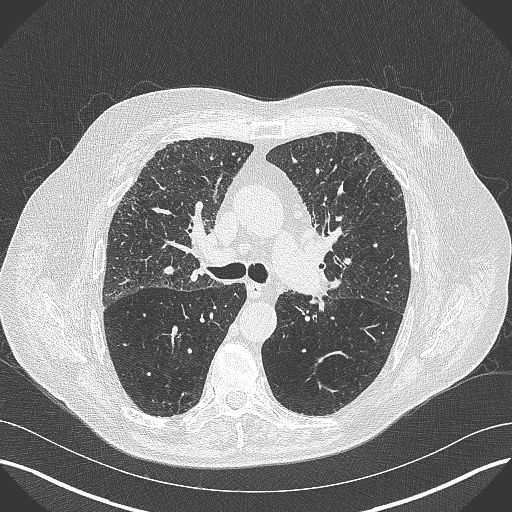
[im 200/293  lung]
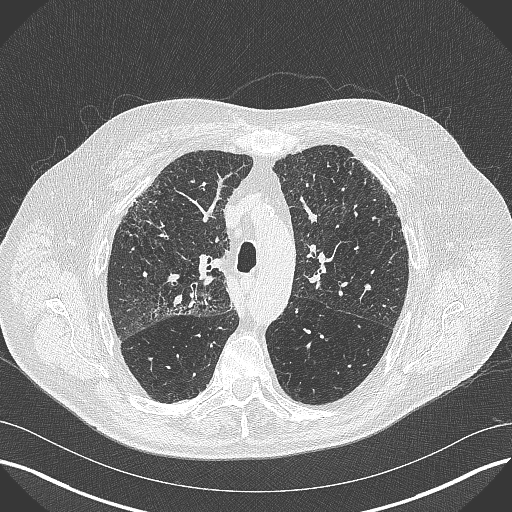
[im 226/293  mediastinal]
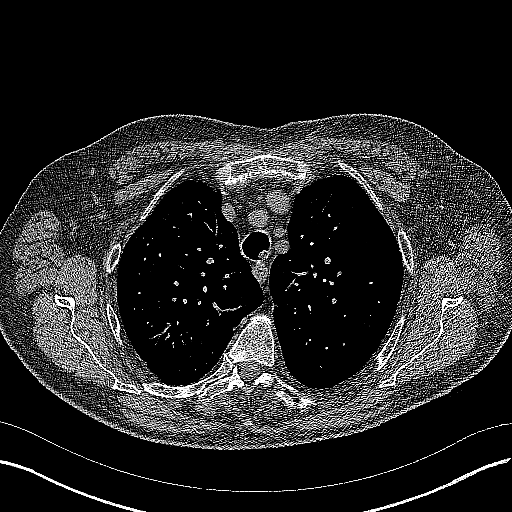
[im 226/293  lung]
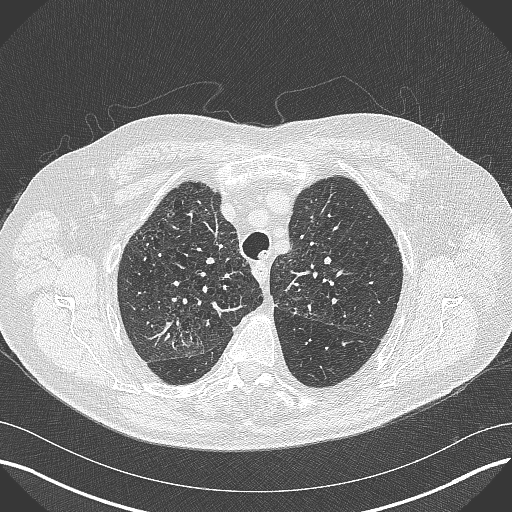
[im 253/293  lung]
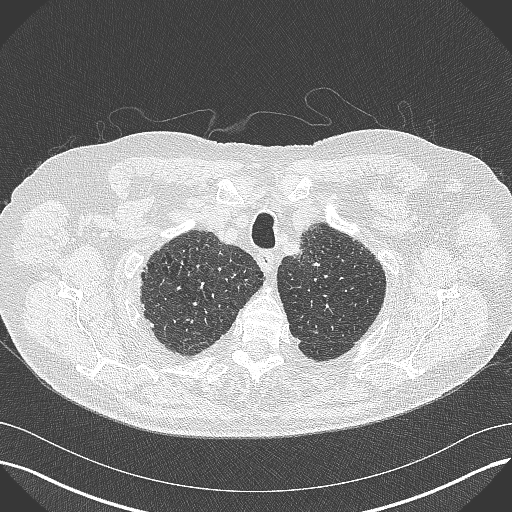
[im 279/293  lung]
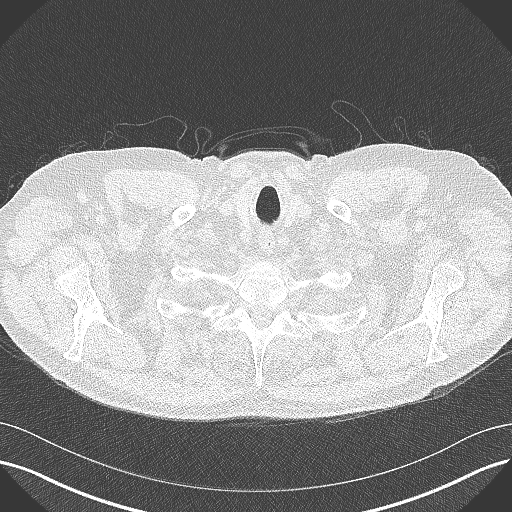

[Series 5: coronal · coronal · 0.60mm/px · 3 of 151 slices shown]
[im 31/151  lung]
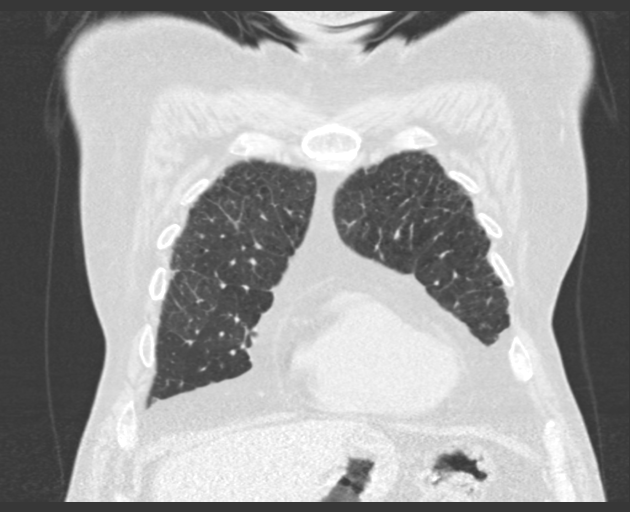
[im 61/151  lung]
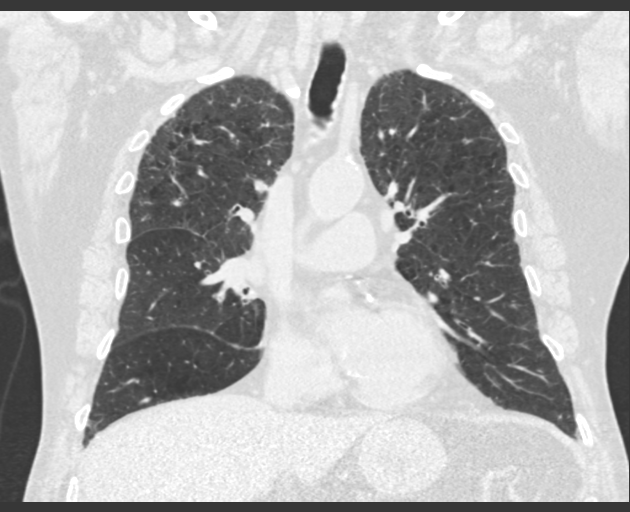
[im 91/151  lung]
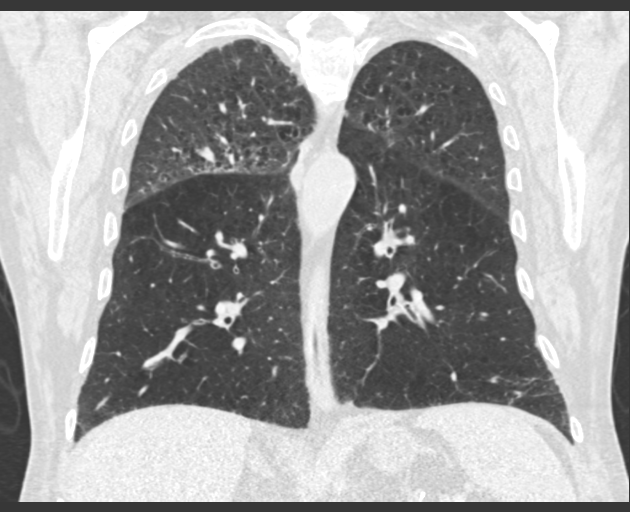

[14 of 36 positions shown; findings below may reference images not displayed]

FINDINGS: Cardiovascular: Heart size is normal. There is no significant
pericardial fluid, thickening or pericardial calcification. There is
aortic atherosclerosis, as well as atherosclerosis of the great
vessels of the mediastinum and the coronary arteries, including
calcified atherosclerotic plaque in the left main, left anterior
descending, left circumflex and right coronary arteries. Mild
calcifications of the aortic valve.

Mediastinum/Nodes: No pathologically enlarged mediastinal or hilar
lymph nodes. Please note that accurate exclusion of hilar adenopathy
is limited on noncontrast CT scans. Esophagus is unremarkable in
appearance. No axillary lymphadenopathy.

Lungs/Pleura: Multiple small pulmonary nodules are noted throughout
the lungs bilaterally, generally similar in number and size to the
prior examination, with exception of a new nodule in the posterior
aspect of the left lower lobe (axial image 203 of series 3), with a
volume derived mean diameter of 7.5 mm. No other larger more
suspicious appearing pulmonary nodules or masses are noted. No acute
consolidative airspace disease. No pleural effusions. Diffuse
bronchial wall thickening with mild to moderate centrilobular and
paraseptal emphysema.

Upper Abdomen: Aortic atherosclerosis.

Musculoskeletal: There are no aggressive appearing lytic or blastic
lesions noted in the visualized portions of the skeleton.
IMPRESSION: 1. Lung-RADS 2S, benign appearance or behavior. Continue annual
screening with low-dose chest CT without contrast in 12 months.
2. The "S" modifier above refers to potentially clinically
significant non lung cancer related findings. Specifically, there is
aortic atherosclerosis, in addition to left main and 3 vessel
coronary artery disease. Assessment for potential risk factor
modification, dietary therapy or pharmacologic therapy may be
warranted, if clinically indicated.
3. Mild diffuse bronchial wall thickening with mild to moderate
centrilobular and paraseptal emphysema; imaging findings suggestive
of underlying COPD.
4. There are calcifications of the aortic valve. Echocardiographic
correlation for evaluation of potential valvular dysfunction may be
warranted if clinically indicated.

Aortic Atherosclerosis (2V5ZQ-EU3.3) and Emphysema (2V5ZQ-OKI.V).

## 2021-09-08 DIAGNOSIS — F419 Anxiety disorder, unspecified: Secondary | ICD-10-CM | POA: Diagnosis not present

## 2021-09-08 DIAGNOSIS — J449 Chronic obstructive pulmonary disease, unspecified: Secondary | ICD-10-CM | POA: Diagnosis not present

## 2021-09-08 DIAGNOSIS — Z9981 Dependence on supplemental oxygen: Secondary | ICD-10-CM | POA: Diagnosis not present

## 2021-09-08 DIAGNOSIS — R609 Edema, unspecified: Secondary | ICD-10-CM | POA: Diagnosis not present

## 2021-09-12 DIAGNOSIS — Z87891 Personal history of nicotine dependence: Secondary | ICD-10-CM | POA: Diagnosis not present

## 2021-09-12 DIAGNOSIS — L97221 Non-pressure chronic ulcer of left calf limited to breakdown of skin: Secondary | ICD-10-CM | POA: Diagnosis not present

## 2021-09-12 DIAGNOSIS — Z792 Long term (current) use of antibiotics: Secondary | ICD-10-CM | POA: Diagnosis not present

## 2021-09-12 DIAGNOSIS — I87312 Chronic venous hypertension (idiopathic) with ulcer of left lower extremity: Secondary | ICD-10-CM | POA: Diagnosis not present

## 2021-09-12 DIAGNOSIS — Z9981 Dependence on supplemental oxygen: Secondary | ICD-10-CM | POA: Diagnosis not present

## 2021-09-12 DIAGNOSIS — J449 Chronic obstructive pulmonary disease, unspecified: Secondary | ICD-10-CM | POA: Diagnosis not present

## 2021-09-22 DIAGNOSIS — R21 Rash and other nonspecific skin eruption: Secondary | ICD-10-CM | POA: Diagnosis not present

## 2021-09-22 DIAGNOSIS — J449 Chronic obstructive pulmonary disease, unspecified: Secondary | ICD-10-CM | POA: Diagnosis not present

## 2021-09-22 DIAGNOSIS — Z9981 Dependence on supplemental oxygen: Secondary | ICD-10-CM | POA: Diagnosis not present

## 2021-09-22 DIAGNOSIS — R600 Localized edema: Secondary | ICD-10-CM | POA: Diagnosis not present

## 2021-10-01 DIAGNOSIS — J42 Unspecified chronic bronchitis: Secondary | ICD-10-CM | POA: Diagnosis not present

## 2021-10-01 DIAGNOSIS — J449 Chronic obstructive pulmonary disease, unspecified: Secondary | ICD-10-CM | POA: Diagnosis not present

## 2021-10-01 DIAGNOSIS — J9611 Chronic respiratory failure with hypoxia: Secondary | ICD-10-CM | POA: Diagnosis not present

## 2021-10-01 DIAGNOSIS — J439 Emphysema, unspecified: Secondary | ICD-10-CM | POA: Diagnosis not present

## 2021-10-03 DIAGNOSIS — L97221 Non-pressure chronic ulcer of left calf limited to breakdown of skin: Secondary | ICD-10-CM | POA: Diagnosis not present

## 2021-10-03 DIAGNOSIS — Z87891 Personal history of nicotine dependence: Secondary | ICD-10-CM | POA: Diagnosis not present

## 2021-10-03 DIAGNOSIS — I87312 Chronic venous hypertension (idiopathic) with ulcer of left lower extremity: Secondary | ICD-10-CM | POA: Diagnosis not present

## 2021-10-03 DIAGNOSIS — J449 Chronic obstructive pulmonary disease, unspecified: Secondary | ICD-10-CM | POA: Diagnosis not present

## 2021-10-03 DIAGNOSIS — Z792 Long term (current) use of antibiotics: Secondary | ICD-10-CM | POA: Diagnosis not present

## 2021-10-03 DIAGNOSIS — Z9981 Dependence on supplemental oxygen: Secondary | ICD-10-CM | POA: Diagnosis not present

## 2021-10-04 ENCOUNTER — Ambulatory Visit: Payer: PPO | Admitting: Pulmonary Disease

## 2021-10-04 ENCOUNTER — Encounter: Payer: Self-pay | Admitting: Pulmonary Disease

## 2021-10-04 VITALS — BP 132/70 | HR 84 | Temp 98.0°F | Ht 71.0 in | Wt 227.9 lb

## 2021-10-04 DIAGNOSIS — Z23 Encounter for immunization: Secondary | ICD-10-CM

## 2021-10-04 DIAGNOSIS — J449 Chronic obstructive pulmonary disease, unspecified: Secondary | ICD-10-CM | POA: Diagnosis not present

## 2021-10-04 DIAGNOSIS — J9611 Chronic respiratory failure with hypoxia: Secondary | ICD-10-CM

## 2021-10-04 NOTE — Assessment & Plan Note (Signed)
Continue Breztri.  He had some reversible component. No reason for sudden decline since 2020.  He does not have any COVID-related postinflammatory changes on CT, pulmonary embolism has been ruled out. He is unable to participate in a pulmonary rehab program due to his leg edema and ulcers

## 2021-10-04 NOTE — Assessment & Plan Note (Addendum)
Continue oxygen 24/7. He will use 3 L at home and 3 L POC when outside Vaccinations with flu, RSV and COVID booster was discussed and encouraged

## 2021-10-04 NOTE — Patient Instructions (Signed)
Flu, RSV & covid booster shots recommended  CT scan shows unchanged nodules, probably benign. Continue on [employer]

## 2021-10-04 NOTE — Progress Notes (Signed)
   Subjective:    Patient ID: [name] [name], male    DOB: 08/09/1945, 76 y.o.   MRN: [id number]  HPI  76 yo heavy ex-smoker  For FU of  COPD. He smoked about a pack per day starting as a teenager until he quit in 2010- about 40 pack years. -on portable oxygen 03/2019 -uses 2 L continuous at home and  POC 3 to 4 L when he goes out -completed pulmonary rehab 2020   Chief Complaint  Patient presents with   Follow-up    Pt is doing okay. Still on 3L 02 24/7    Accompanied by his daughter, uses 3 L POC and 2 L continuous at home. He feels he had a sudden decline in 2020 and not sure why, to the point of using oxygen 24/7. He was undergoing home PT but is now limited by leg edema and a leg ulcer for which she is seeing home health, his legs are wrapped today. Legs hurt and he takes up to 6 ibuprofens a day. Compliant with Breztri.  Breathing is at baseline.  We reviewed CT angiogram chest  Significant tests/ events reviewed  07/2020 ONO /RA -desaturation less than 88% for 23 minutes   CTA 06/2021 >> Upper lobe predominant emphysema and Scarring.Multiple small calcified and noncalcified pulmonary nodules are unchanged    LDCT 07/05/20 showed lung RADS 2. Centrilobular and paraseptal emphysema , unchanged nodules compared to 2020   PFTs 09/2012 moderate reversible airway obstruction, ratio 49, FEV1 44%, improved to 56%/25% bronchodilator response, TLC 88%, DLCO 55%   Spirometry 03/18/2018 showed ratio of 61, FEV1 of 43% and FVC of 52%    Review of Systems neg for any significant sore throat, dysphagia, itching, sneezing, nasal congestion or excess/ purulent secretions, fever, chills, sweats, unintended wt loss, pleuritic or exertional cp, hempoptysis, orthopnea pnd or change in chronic leg swelling. Also denies presyncope, palpitations, heartburn, abdominal pain, nausea, vomiting, diarrhea or change in bowel or urinary habits, dysuria,hematuria, rash, arthralgias, visual complaints,  headache, numbness weakness or ataxia.     Objective:   Physical Exam  Gen. Pleasant, well-nourished, in no distress ENT - no thrush, no pallor/icterus,no post nasal drip, on O2  Neck: No JVD, no thyromegaly, no carotid bruits Lungs: no use of accessory muscles, no dullness to percussion, clear without rales or rhonchi  Cardiovascular: Rhythm regular, heart sounds  normal, no murmurs or gallops, 2+ peripheral edema Musculoskeletal: No deformities, no cyanosis or clubbing  LLE wrapped       Assessment & Plan:   Pulmonary nodules -unchanged on CT, some calcified, likely benign

## 2021-10-09 DIAGNOSIS — Z792 Long term (current) use of antibiotics: Secondary | ICD-10-CM | POA: Diagnosis not present

## 2021-10-09 DIAGNOSIS — J449 Chronic obstructive pulmonary disease, unspecified: Secondary | ICD-10-CM | POA: Diagnosis not present

## 2021-10-09 DIAGNOSIS — L97221 Non-pressure chronic ulcer of left calf limited to breakdown of skin: Secondary | ICD-10-CM | POA: Diagnosis not present

## 2021-10-09 DIAGNOSIS — I87312 Chronic venous hypertension (idiopathic) with ulcer of left lower extremity: Secondary | ICD-10-CM | POA: Diagnosis not present

## 2021-10-09 DIAGNOSIS — Z87891 Personal history of nicotine dependence: Secondary | ICD-10-CM | POA: Diagnosis not present

## 2021-10-09 DIAGNOSIS — Z9981 Dependence on supplemental oxygen: Secondary | ICD-10-CM | POA: Diagnosis not present

## 2021-10-16 DIAGNOSIS — I872 Venous insufficiency (chronic) (peripheral): Secondary | ICD-10-CM | POA: Diagnosis not present

## 2021-10-19 DIAGNOSIS — I872 Venous insufficiency (chronic) (peripheral): Secondary | ICD-10-CM | POA: Diagnosis not present

## 2021-10-23 DIAGNOSIS — J449 Chronic obstructive pulmonary disease, unspecified: Secondary | ICD-10-CM | POA: Diagnosis not present

## 2021-10-23 DIAGNOSIS — I10 Essential (primary) hypertension: Secondary | ICD-10-CM | POA: Diagnosis not present

## 2021-10-23 DIAGNOSIS — I700 Atherosclerosis of aorta: Secondary | ICD-10-CM | POA: Diagnosis not present

## 2021-10-23 DIAGNOSIS — E7800 Pure hypercholesterolemia, unspecified: Secondary | ICD-10-CM | POA: Diagnosis not present

## 2021-10-23 DIAGNOSIS — Z9981 Dependence on supplemental oxygen: Secondary | ICD-10-CM | POA: Diagnosis not present

## 2021-10-23 DIAGNOSIS — R194 Change in bowel habit: Secondary | ICD-10-CM | POA: Diagnosis not present

## 2021-10-23 DIAGNOSIS — Z1211 Encounter for screening for malignant neoplasm of colon: Secondary | ICD-10-CM | POA: Diagnosis not present

## 2021-10-31 DIAGNOSIS — J9611 Chronic respiratory failure with hypoxia: Secondary | ICD-10-CM | POA: Diagnosis not present

## 2021-10-31 DIAGNOSIS — J449 Chronic obstructive pulmonary disease, unspecified: Secondary | ICD-10-CM | POA: Diagnosis not present

## 2021-10-31 DIAGNOSIS — J42 Unspecified chronic bronchitis: Secondary | ICD-10-CM | POA: Diagnosis not present

## 2021-10-31 DIAGNOSIS — J439 Emphysema, unspecified: Secondary | ICD-10-CM | POA: Diagnosis not present

## 2021-11-01 ENCOUNTER — Encounter: Payer: Self-pay | Admitting: Gastroenterology

## 2021-11-09 DIAGNOSIS — J449 Chronic obstructive pulmonary disease, unspecified: Secondary | ICD-10-CM | POA: Diagnosis not present

## 2021-11-09 DIAGNOSIS — Z87891 Personal history of nicotine dependence: Secondary | ICD-10-CM | POA: Diagnosis not present

## 2021-11-09 DIAGNOSIS — Z792 Long term (current) use of antibiotics: Secondary | ICD-10-CM | POA: Diagnosis not present

## 2021-11-09 DIAGNOSIS — Z9981 Dependence on supplemental oxygen: Secondary | ICD-10-CM | POA: Diagnosis not present

## 2021-11-09 DIAGNOSIS — L97221 Non-pressure chronic ulcer of left calf limited to breakdown of skin: Secondary | ICD-10-CM | POA: Diagnosis not present

## 2021-11-09 DIAGNOSIS — I87312 Chronic venous hypertension (idiopathic) with ulcer of left lower extremity: Secondary | ICD-10-CM | POA: Diagnosis not present

## 2021-11-24 DIAGNOSIS — I872 Venous insufficiency (chronic) (peripheral): Secondary | ICD-10-CM | POA: Diagnosis not present

## 2021-12-01 DIAGNOSIS — J42 Unspecified chronic bronchitis: Secondary | ICD-10-CM | POA: Diagnosis not present

## 2021-12-01 DIAGNOSIS — J439 Emphysema, unspecified: Secondary | ICD-10-CM | POA: Diagnosis not present

## 2021-12-01 DIAGNOSIS — J449 Chronic obstructive pulmonary disease, unspecified: Secondary | ICD-10-CM | POA: Diagnosis not present

## 2021-12-01 DIAGNOSIS — J9611 Chronic respiratory failure with hypoxia: Secondary | ICD-10-CM | POA: Diagnosis not present

## 2021-12-02 DIAGNOSIS — Z87891 Personal history of nicotine dependence: Secondary | ICD-10-CM | POA: Diagnosis not present

## 2021-12-02 DIAGNOSIS — J449 Chronic obstructive pulmonary disease, unspecified: Secondary | ICD-10-CM | POA: Diagnosis not present

## 2021-12-02 DIAGNOSIS — I87312 Chronic venous hypertension (idiopathic) with ulcer of left lower extremity: Secondary | ICD-10-CM | POA: Diagnosis not present

## 2021-12-02 DIAGNOSIS — Z9981 Dependence on supplemental oxygen: Secondary | ICD-10-CM | POA: Diagnosis not present

## 2021-12-02 DIAGNOSIS — Z792 Long term (current) use of antibiotics: Secondary | ICD-10-CM | POA: Diagnosis not present

## 2021-12-02 DIAGNOSIS — L97221 Non-pressure chronic ulcer of left calf limited to breakdown of skin: Secondary | ICD-10-CM | POA: Diagnosis not present

## 2021-12-08 ENCOUNTER — Other Ambulatory Visit: Payer: Self-pay | Admitting: *Deleted

## 2021-12-08 MED ORDER — ALBUTEROL SULFATE HFA 108 (90 BASE) MCG/ACT IN AERS: 2.0000 | INHALATION_SPRAY | Freq: Four times a day (QID) | RESPIRATORY_TRACT | 5 refills | Status: DC | PRN

## 2021-12-11 DIAGNOSIS — Z87891 Personal history of nicotine dependence: Secondary | ICD-10-CM | POA: Diagnosis not present

## 2021-12-11 DIAGNOSIS — I87312 Chronic venous hypertension (idiopathic) with ulcer of left lower extremity: Secondary | ICD-10-CM | POA: Diagnosis not present

## 2021-12-11 DIAGNOSIS — L97221 Non-pressure chronic ulcer of left calf limited to breakdown of skin: Secondary | ICD-10-CM | POA: Diagnosis not present

## 2021-12-11 DIAGNOSIS — Z9981 Dependence on supplemental oxygen: Secondary | ICD-10-CM | POA: Diagnosis not present

## 2021-12-11 DIAGNOSIS — J449 Chronic obstructive pulmonary disease, unspecified: Secondary | ICD-10-CM | POA: Diagnosis not present

## 2021-12-11 DIAGNOSIS — Z792 Long term (current) use of antibiotics: Secondary | ICD-10-CM | POA: Diagnosis not present

## 2021-12-19 DIAGNOSIS — I700 Atherosclerosis of aorta: Secondary | ICD-10-CM | POA: Diagnosis not present

## 2021-12-19 DIAGNOSIS — Z0001 Encounter for general adult medical examination with abnormal findings: Secondary | ICD-10-CM | POA: Diagnosis not present

## 2021-12-19 DIAGNOSIS — Z79899 Other long term (current) drug therapy: Secondary | ICD-10-CM | POA: Diagnosis not present

## 2021-12-19 DIAGNOSIS — F1020 Alcohol dependence, uncomplicated: Secondary | ICD-10-CM | POA: Diagnosis not present

## 2021-12-19 DIAGNOSIS — R7301 Impaired fasting glucose: Secondary | ICD-10-CM | POA: Diagnosis not present

## 2021-12-19 DIAGNOSIS — I2510 Atherosclerotic heart disease of native coronary artery without angina pectoris: Secondary | ICD-10-CM | POA: Diagnosis not present

## 2021-12-19 DIAGNOSIS — Z9981 Dependence on supplemental oxygen: Secondary | ICD-10-CM | POA: Diagnosis not present

## 2021-12-19 DIAGNOSIS — I10 Essential (primary) hypertension: Secondary | ICD-10-CM | POA: Diagnosis not present

## 2021-12-19 DIAGNOSIS — J449 Chronic obstructive pulmonary disease, unspecified: Secondary | ICD-10-CM | POA: Diagnosis not present

## 2021-12-19 DIAGNOSIS — F419 Anxiety disorder, unspecified: Secondary | ICD-10-CM | POA: Diagnosis not present

## 2021-12-19 DIAGNOSIS — E7800 Pure hypercholesterolemia, unspecified: Secondary | ICD-10-CM | POA: Diagnosis not present

## 2021-12-19 DIAGNOSIS — J9611 Chronic respiratory failure with hypoxia: Secondary | ICD-10-CM | POA: Diagnosis not present

## 2021-12-20 ENCOUNTER — Ambulatory Visit: Payer: PPO | Admitting: Cardiovascular Disease

## 2021-12-25 DIAGNOSIS — I872 Venous insufficiency (chronic) (peripheral): Secondary | ICD-10-CM | POA: Diagnosis not present

## 2021-12-29 DIAGNOSIS — I872 Venous insufficiency (chronic) (peripheral): Secondary | ICD-10-CM | POA: Diagnosis not present

## 2021-12-31 DIAGNOSIS — J9611 Chronic respiratory failure with hypoxia: Secondary | ICD-10-CM | POA: Diagnosis not present

## 2021-12-31 DIAGNOSIS — J449 Chronic obstructive pulmonary disease, unspecified: Secondary | ICD-10-CM | POA: Diagnosis not present

## 2021-12-31 DIAGNOSIS — J439 Emphysema, unspecified: Secondary | ICD-10-CM | POA: Diagnosis not present

## 2021-12-31 DIAGNOSIS — J42 Unspecified chronic bronchitis: Secondary | ICD-10-CM | POA: Diagnosis not present

## 2022-01-09 DIAGNOSIS — Z792 Long term (current) use of antibiotics: Secondary | ICD-10-CM | POA: Diagnosis not present

## 2022-01-09 DIAGNOSIS — L97221 Non-pressure chronic ulcer of left calf limited to breakdown of skin: Secondary | ICD-10-CM | POA: Diagnosis not present

## 2022-01-09 DIAGNOSIS — I87312 Chronic venous hypertension (idiopathic) with ulcer of left lower extremity: Secondary | ICD-10-CM | POA: Diagnosis not present

## 2022-01-09 DIAGNOSIS — Z9981 Dependence on supplemental oxygen: Secondary | ICD-10-CM | POA: Diagnosis not present

## 2022-01-09 DIAGNOSIS — J449 Chronic obstructive pulmonary disease, unspecified: Secondary | ICD-10-CM | POA: Diagnosis not present

## 2022-01-09 DIAGNOSIS — Z87891 Personal history of nicotine dependence: Secondary | ICD-10-CM | POA: Diagnosis not present

## 2022-01-23 DIAGNOSIS — I872 Venous insufficiency (chronic) (peripheral): Secondary | ICD-10-CM | POA: Diagnosis not present

## 2022-01-24 DIAGNOSIS — I2510 Atherosclerotic heart disease of native coronary artery without angina pectoris: Secondary | ICD-10-CM | POA: Diagnosis not present

## 2022-01-24 DIAGNOSIS — L97921 Non-pressure chronic ulcer of unspecified part of left lower leg limited to breakdown of skin: Secondary | ICD-10-CM | POA: Diagnosis not present

## 2022-01-24 DIAGNOSIS — J432 Centrilobular emphysema: Secondary | ICD-10-CM | POA: Diagnosis not present

## 2022-01-24 DIAGNOSIS — I700 Atherosclerosis of aorta: Secondary | ICD-10-CM | POA: Diagnosis not present

## 2022-01-24 DIAGNOSIS — R600 Localized edema: Secondary | ICD-10-CM | POA: Diagnosis not present

## 2022-01-24 DIAGNOSIS — Z9981 Dependence on supplemental oxygen: Secondary | ICD-10-CM | POA: Diagnosis not present

## 2022-01-24 DIAGNOSIS — F419 Anxiety disorder, unspecified: Secondary | ICD-10-CM | POA: Diagnosis not present

## 2022-01-25 ENCOUNTER — Encounter (HOSPITAL_BASED_OUTPATIENT_CLINIC_OR_DEPARTMENT_OTHER): Payer: PPO | Attending: General Surgery | Admitting: General Surgery

## 2022-01-25 DIAGNOSIS — J9611 Chronic respiratory failure with hypoxia: Secondary | ICD-10-CM | POA: Insufficient documentation

## 2022-01-25 DIAGNOSIS — G629 Polyneuropathy, unspecified: Secondary | ICD-10-CM | POA: Diagnosis not present

## 2022-01-25 DIAGNOSIS — J449 Chronic obstructive pulmonary disease, unspecified: Secondary | ICD-10-CM | POA: Insufficient documentation

## 2022-01-25 DIAGNOSIS — Z9981 Dependence on supplemental oxygen: Secondary | ICD-10-CM | POA: Diagnosis not present

## 2022-01-25 DIAGNOSIS — I2510 Atherosclerotic heart disease of native coronary artery without angina pectoris: Secondary | ICD-10-CM | POA: Insufficient documentation

## 2022-01-25 DIAGNOSIS — L97821 Non-pressure chronic ulcer of other part of left lower leg limited to breakdown of skin: Secondary | ICD-10-CM | POA: Insufficient documentation

## 2022-01-25 DIAGNOSIS — I872 Venous insufficiency (chronic) (peripheral): Secondary | ICD-10-CM | POA: Diagnosis not present

## 2022-01-25 DIAGNOSIS — L97528 Non-pressure chronic ulcer of other part of left foot with other specified severity: Secondary | ICD-10-CM | POA: Diagnosis not present

## 2022-01-25 DIAGNOSIS — I10 Essential (primary) hypertension: Secondary | ICD-10-CM | POA: Insufficient documentation

## 2022-01-25 NOTE — Progress Notes (Signed)
Begue, Wellington J ([id number]) [id number] Page 1 of 4 Visit Report for 01/25/2022 Abuse Risk Screen Details Patient Name: Date of Service: ANTION, ANDRES IN J. 01/25/2022 1:15 PM Medical Record Number: [id number] Patient Account Number: [id number] Date of Birth/Sex: Treating RN: [birth year] (76 y.o. Waldron Session Primary Care Deauna Yaw: [name], [name]: Referring Tempest Frankland: Treating Leshaun Biebel/Extender: Adrian Prows, [name] Weeks in Treatment: 0 Abuse Risk Screen Items Answer ABUSE RISK SCREEN: Has anyone close to you tried to hurt or harm you recentlyo No Do you feel uncomfortable with anyone in your familyo No Has anyone forced you do things that you didnt want to doo No Electronic Signature(s) Signed: 01/25/2022 4:47:13 PM By: Blanche East RN Entered By: Blanche East on 01/25/2022 13:51:23 -------------------------------------------------------------------------------- Activities of Daily Living Details Patient Name: Date of Service: PIPER, ALBRO IN J. 01/25/2022 1:15 PM Medical Record Number: [id number] Patient Account Number: [id number] Date of Birth/Sex: Treating RN: 04-14-1945 (77 y.o. Waldron Session Primary Care Josphine Laffey: [name], [name]: Referring Akera Snowberger: Treating Amayah Staheli/Extender: Adrian Prows, [name] Weeks in Treatment: 0 Activities of Daily Living Items Answer Activities of Daily Living (Please select one for each item) Drive Automobile Completely Able T Medications ake Completely Able Use T elephone Completely Able Care for Appearance Completely Able Use T oilet Need Assistance Bath / Shower Completely Able Dress Self Completely Able Feed Self Completely Able Walk Completely Able Get In / Out Bed Completely Able Housework Need Assistance Prepare Meals Need Assistance Handle Money Need Assistance Shop for Self Need Assistance Electronic Signature(s) Signed:  01/25/2022 4:47:13 PM By: Blanche East RN Entered By: Blanche East on 01/25/2022 14:03:20 Medaglia, Ki J ([id number]) [id number] Page 2 of 4 -------------------------------------------------------------------------------- Education Screening Details Patient Name: Date of Service: ERASMUS, BISTLINE IN J. 01/25/2022 1:15 PM Medical Record Number: [id number] Patient Account Number: [id number] Date of Birth/Sex: Treating RN: Jul 11, 1945 (77 y.o. Waldron Session Primary Care Leonidas Boateng: [name], [name]: Referring Lester Platas: Treating Marik Sedore/Extender: Adrian Prows, [name] Weeks in Treatment: 0 Primary Learner Assessed: Patient Learning Preferences/Education Level/Primary Language Learning Preference: Explanation Highest Education Level: College or Above Preferred Language: English Cognitive Barrier Language Barrier: No Translator Needed: No Memory Deficit: No Emotional Barrier: No Cultural/Religious Beliefs Affecting Medical Care: No Physical Barrier Impaired Vision: No Impaired Hearing: No Decreased Hand dexterity: No Knowledge/Comprehension Knowledge Level: High Comprehension Level: High Ability to understand written instructions: High Ability to understand verbal instructions: High Motivation Anxiety Level: Calm Cooperation: Cooperative Education Importance: Acknowledges Need Interest in Health Problems: Asks Questions Perception: Coherent Willingness to Engage in Self-Management High Activities: Readiness to Engage in Self-Management High Activities: Electronic Signature(s) Signed: 01/25/2022 4:47:13 PM By: Blanche East RN Entered By: Blanche East on 01/25/2022 14:03:48 -------------------------------------------------------------------------------- Fall Risk Assessment Details Patient Name: Date of Service: Gillison, [name] 01/25/2022 1:15 PM Medical Record Number: [id number] Patient Account  Number: [id number] Date of Birth/Sex: Treating RN: 1945-03-14 (77 y.o. Waldron Session Primary Care Calvary Difranco: [name], [name]: Referring Davari Lopes: Treating Carlota Philley/Extender: Adrian Prows, [name] Weeks in Treatment: 0 Fall Risk Assessment Items Have you had 2 or more falls in the last 12 monthso 0 No Musich, Terrin J ([id number]) [phone] [id number] Page 3 of 4 Have you had any fall that resulted in injury in the last 12 monthso 0 No FALLS RISK SCREEN History of falling - immediate or within 3 months 0 No Secondary diagnosis (Do you have 2 or more medical diagnoseso) 0 No Ambulatory aid None/bed rest/wheelchair/nurse 0 No Crutches/cane/walker 15  Yes Furniture 0 No Intravenous therapy Access/Saline/Heparin Lock 0 No Gait/Transferring Normal/ bed rest/ wheelchair 0 Yes Weak (short steps with or without shuffle, stooped but able to lift head while walking, may seek 0 No support from furniture) Impaired (short steps with shuffle, may have difficulty arising from chair, head down, impaired 0 No balance) Mental Status Oriented to own ability 0 Yes Electronic Signature(s) Signed: 01/25/2022 4:47:13 PM By: Blanche East RN Entered By: Blanche East on 01/25/2022 14:04:18 -------------------------------------------------------------------------------- Foot Assessment Details Patient Name: Date of Service: JEREMIH, DEARMAS IN J. 01/25/2022 1:15 PM Medical Record Number: [id number] Patient Account Number: [id number] Date of Birth/Sex: Treating RN: Nov 07, 1945 (77 y.o. Waldron Session Primary Care Berdene Askari: [name], [name]: Referring Zahirah Cheslock: Treating Kadedra Vanaken/Extender: Adrian Prows, [name] Weeks in Treatment: 0 Foot Assessment Items Site Locations + = Sensation present, - = Sensation absent, C = Callus, U = Ulcer R = Redness, W = Warmth, M = Maceration, PU = Pre-ulcerative lesion F = Fissure, S =  Swelling, D = Dryness Assessment Right: Left: Other Deformity: No No Prior Foot Ulcer: No No Prior Amputation: No No Charcot Joint: No No Ambulatory Status: Ambulatory Without Help Gait: Steady Vandoren, Hayzen J ([id number]) [id number] Page 4 of 4 Electronic Signature(s) Signed: 01/25/2022 4:47:13 PM By: Blanche East RN Entered By: Blanche East on 01/25/2022 14:06:08 -------------------------------------------------------------------------------- Nutrition Risk Screening Details Patient Name: Date of Service: LEBERT, LOVERN IN J. 01/25/2022 1:15 PM Medical Record Number: [id number] Patient Account Number: [id number] Date of Birth/Sex: Treating RN: 10-26-1945 (77 y.o. Waldron Session Primary Care Niomi Valent: [name], [name]: Referring Lovel Suazo: Treating Yulian Gosney/Extender: Adrian Prows, [name] Weeks in Treatment: 0 Height (in): 71 Weight (lbs): 225 Body Mass Index (BMI): 31.4 Nutrition Risk Screening Items Score Screening NUTRITION RISK SCREEN: I have an illness or condition that made me change the kind and/or amount of food I eat 0 No I eat fewer than two meals per day 0 No I eat few fruits and vegetables, or milk products 0 No I have three or more drinks of beer, liquor or wine almost every day 0 No I have tooth or mouth problems that make it hard for me to eat 0 No I don't always have enough money to buy the food I need 0 No I eat alone most of the time 0 No I take three or more different prescribed or over-the-counter drugs a day 1 Yes Without wanting to, I have lost or gained 10 pounds in the last six months 0 No I am not always physically able to shop, cook and/or feed myself 0 No Nutrition Protocols Good Risk Protocol 0 No interventions needed Moderate Risk Protocol High Risk Proctocol Risk Level: Good Risk Score: 1 Electronic Signature(s) Signed: 01/25/2022 4:47:13 PM By: Blanche East RN Entered By:  Blanche East on 01/25/2022 14:04:53

## 2022-01-25 NOTE — Progress Notes (Addendum)
Bulger, Shiraz J ([id number]) [id number] Page 1 of 10 Visit Report for 01/25/2022 Allergy List Details Patient Name: Date of Service: [name] [name], [name] 01/25/2022 1:15 PM Medical Record Number: [id number] Patient Account Number: [id number] Date of Birth/Sex: Treating RN: 03/08/1945 (77 y.o. Valma Cava Primary Care Lakysha Kossman: Docia Chuck, [name]: Referring Jalayne Ganesh: Treating Detrick Dani/Extender: Leroy Libman, [name] Weeks in Treatment: 0 Allergies Active Allergies fluticasone salmetrol Reaction: SOB Type: Medication Allergy Notes Electronic Signature(s) Signed: 01/25/2022 4:47:13 PM By: Tommie Ard RN Previous Signature: 01/25/2022 10:04:33 AM Version By: Tommie Ard RN Entered By: Tommie Ard on 01/25/2022 13:49:13 -------------------------------------------------------------------------------- Arrival Information Details Patient Name: Date of Service: [name] Dubonnet IN J. 01/25/2022 1:15 PM Medical Record Number: [id number] Patient Account Number: [id number] Date of Birth/Sex: Treating RN: [birth year] (77 y.o. M) Primary Care Ishika Chesterfield: Docia Chuck, [name]: Referring Gaston Dase: Treating Declyn Offield/Extender: Leroy Libman, [name] Weeks in Treatment: 0 Visit Information Patient Arrived: Cane Arrival Time: 13:29 Accompanied By: self Transfer Assistance: None Patient Identification Verified: Yes Secondary Verification Process Completed: Yes History Since Last Visit All ordered tests and consults were completed: No Added or deleted any medications: No Any new allergies or adverse reactions: No Had a fall or experienced change in activities of daily living that may affect risk of falls: No Signs or symptoms of abuse/neglect since last visito No Hospitalized since last visit: No Implantable device outside of the clinic excluding cellular tissue based products placed in the center since last visit:  No Electronic Signature(s) Signed: 01/25/2022 2:14:41 PM By: Dayton Scrape Entered By: Dayton Scrape on 01/25/2022 13:30:17 Weld, Arshad J ([id number]) [id number] Page 2 of 10 -------------------------------------------------------------------------------- Clinic Level of Care Assessment Details Patient Name: Date of Service: [name] [name], [name] 01/25/2022 1:15 PM Medical Record Number: [id number] Patient Account Number: [id number] Date of Birth/Sex: Treating RN: [birth year] (77 y.o. Valma Cava Primary Care Okie Bogacz: Docia Chuck, [name]: Referring Nataleah Scioneaux: Treating Nino Amano/Extender: Leroy Libman, [name] Weeks in Treatment: 0 Clinic Level of Care Assessment Items TOOL 1 Quantity Score X- 1 0 Use when EandM and Procedure is performed on INITIAL visit ASSESSMENTS - Nursing Assessment / Reassessment X- 1 20 General Physical Exam (combine w/ comprehensive assessment (listed just below) when performed on new pt. evals) X- 1 25 Comprehensive Assessment (HX, ROS, Risk Assessments, Wounds Hx, etc.) ASSESSMENTS - Wound and Skin Assessment / Reassessment X- 1 10 Dermatologic / Skin Assessment (not related to wound area) ASSESSMENTS - Ostomy and/or Continence Assessment and Care []  - 0 Incontinence Assessment and Management []  - 0 Ostomy Care Assessment and Management (repouching, etc.) PROCESS - Coordination of Care []  - 0 Simple Patient / Family Education for ongoing care X- 1 20 Complex (extensive) Patient / Family Education for ongoing care X- 1 10 Staff obtains Chiropractor, Records, T Results / Process Orders est X- 1 10 Staff telephones HHA, Nursing Homes / Clarify orders / etc []  - 0 Routine Transfer to another Facility (non-emergent condition) []  - 0 Routine Hospital Admission (non-emergent condition) X- 1 15 New Admissions / Manufacturing engineer / Ordering NPWT Apligraf, etc. , []  - 0 Emergency Hospital  Admission (emergent condition) PROCESS - Special Needs []  - 0 Pediatric / Minor Patient Management []  - 0 Isolation Patient Management []  - 0 Hearing / Language / Visual special needs []  - 0 Assessment of Community assistance (transportation, D/C planning, etc.) []  - 0 Additional assistance / Altered mentation []  - 0 Support Surface(s) Assessment (bed, cushion, seat, etc.) INTERVENTIONS - Miscellaneous []  -  0 External ear exam []  - 0 Patient Transfer (multiple staff / Nurse, adult / Similar devices) []  - 0 Simple Staple / Suture removal (25 or less) []  - 0 Complex Staple / Suture removal (26 or more) []  - 0 Hypo/Hyperglycemic Management (do not check if billed separately) X- 1 15 Ankle / Brachial Index (ABI) - do not check if billed separately Has the patient been seen at the hospital within the last three years: Yes Total Score: 125 Level Of Care: New/Established - Level 4 Electronic Signature(s) Signed: 01/25/2022 4:47:13 PM By: Tommie Ard RN Fullbright, Ajani J 4:47:13 PM By: Tommie Ard RN Signed: 01/25/2022 ([id number]) [id number] Page 3 of 10 Entered By: Tommie Ard on 01/25/2022 16:30:17 -------------------------------------------------------------------------------- Compression Therapy Details Patient Name: Date of Service: [name] [name], [name] 01/25/2022 1:15 PM Medical Record Number: [id number] Patient Account Number: [id number] Date of Birth/Sex: Treating RN: [birth year] (78 y.o. Valma Cava Primary Care Alex Leahy: Docia Chuck, [name]: Referring Leiliana Foody: Treating Keir Viernes/Extender: Leroy Libman, [name] Weeks in Treatment: 0 Compression Therapy Performed for Wound Assessment: Wound #2 Left,Medial Foot Performed By: Clinician Tommie Ard, RN Compression Type: Henriette Combs Post Procedure Diagnosis Same as Pre-procedure Electronic Signature(s) Signed: 01/25/2022 4:47:13 PM By: Tommie Ard RN Entered  By: Tommie Ard on 01/25/2022 14:50:29 -------------------------------------------------------------------------------- Compression Therapy Details Patient Name: Date of Service: [name] [name], [name] 01/25/2022 1:15 PM Medical Record Number: [id number] Patient Account Number: [id number] Date of Birth/Sex: Treating RN: [birth year] (77 y.o. Valma Cava Primary Care Talicia Sui: Docia Chuck, [name]: Referring Matalie Romberger: Treating Icholas Irby/Extender: Leroy Libman, [name] Weeks in Treatment: 0 Compression Therapy Performed for Wound Assessment: Wound #3 Left,Medial Lower Leg Performed By: Clinician Tommie Ard, RN Compression Type: Henriette Combs Post Procedure Diagnosis Same as Pre-procedure Electronic Signature(s) Signed: 01/25/2022 4:41:31 PM By: Tommie Ard RN Entered By: Tommie Ard on 01/25/2022 16:41:31 -------------------------------------------------------------------------------- Compression Therapy Details Patient Name: Date of Service: [name] [name], [name] 01/25/2022 1:15 PM Medical Record Number: [id number] Patient Account Number: [id number] Date of Birth/Sex: Treating RN: 1945-07-30 (77 y.o. Valma Cava Primary Care Asmara Backs: Docia Chuck, [name]: Referring Avereigh Spainhower: Treating Calli Bashor/Extender: Leroy Libman, [name] Weeks in Treatment: 0 Compression Therapy Performed for Wound Assessment: Wound #4 Left,Lateral Lower Leg Gopaul, Shahiem J ([id number]) [id number] Page 4 of 10 Performed By: Clinician Tommie Ard, RN Compression Type: Henriette Combs Post Procedure Diagnosis Same as Pre-procedure Electronic Signature(s) Signed: 01/25/2022 4:41:31 PM By: Tommie Ard RN Entered By: Tommie Ard on 01/25/2022 16:41:31 -------------------------------------------------------------------------------- Encounter Discharge Information Details Patient Name: Date of Service: KHALEN, BETTON IN J.  01/25/2022 1:15 PM Medical Record Number: [id number] Patient Account Number: [id number] Date of Birth/Sex: Treating RN: 03-Oct-1945 (77 y.o. Valma Cava Primary Care Maeson Lourenco: Docia Chuck, [name]: Referring Nazareth Norenberg: Treating Croix Presley/Extender: Leroy Libman, [name] Weeks in Treatment: 0 Encounter Discharge Information Items Discharge Condition: Stable Ambulatory Status: Ambulatory Discharge Destination: Home Transportation: Private Auto Accompanied By: self Schedule Follow-up Appointment: Yes Clinical Summary of Care: Electronic Signature(s) Signed: 01/25/2022 4:31:29 PM By: Tommie Ard RN Entered By: Tommie Ard on 01/25/2022 16:31:28 -------------------------------------------------------------------------------- Lower Extremity Assessment Details Patient Name: Date of Service: [name] [name], [name] 01/25/2022 1:15 PM Medical Record Number: [id number] Patient Account Number: [id number] Date of Birth/Sex: Treating RN: [birth year] (77 y.o. Valma Cava Primary Care Dazha Kempa: Docia Chuck, [name]: Referring Fatumata Kashani: Treating Maily Debarge/Extender: Leroy Libman, [name] Weeks in Treatment: 0 Edema Assessment Assessed: [Left: No] [Right: No] [Left: Edema] [Right: :] Calf Left: Right: Point of Measurement: From Medial  Instep 44 cm Ankle Left: Right: Point of Measurement: From Medial Instep 23.5 cm Vascular Assessment Magallanes, Kaiyon J ([id number]) [Right:[id number] Page 5 of 10] Pulses: Dorsalis Pedis Palpable: [Left:Yes] Blood Pressure: Brachial: [Left:137] Electronic Signature(s) Signed: 01/25/2022 4:47:13 PM By: Tommie Ard RN Entered By: Tommie Ard on 01/25/2022 14:17:21 -------------------------------------------------------------------------------- Multi Wound Chart Details Patient Name: Date of Service: [name] [name], [name] 01/25/2022 1:15 PM Medical Record Number:  [id number] Patient Account Number: [id number] Date of Birth/Sex: Treating RN: Oct 17, 1945 (77 y.o. M) Primary Care Tahnee Cifuentes: Docia Chuck, [name]: Referring Jaden Batchelder: Treating Danyl Deems/Extender: Leroy Libman, [name] Weeks in Treatment: 0 Vital Signs Height(in): 71 Pulse(bpm): 106 Weight(lbs): 225 Blood Pressure(mmHg): 137/64 Body Mass Index(BMI): 31.4 Temperature(F): 97.4 Respiratory Rate(breaths/min): 24 [2:Photos:] [N/A:N/A] Left, Medial Foot N/A N/A Wound Location: Laceration N/A N/A Wounding Event: Venous Leg Ulcer N/A N/A Primary Etiology: Chronic Obstructive Pulmonary N/A N/A Comorbid History: Disease (COPD), Coronary Artery Disease, Hypertension, Neuropathy 01/25/2022 N/A N/A Date Acquired: 0 N/A N/A Weeks of Treatment: Open N/A N/A Wound Status: No N/A N/A Wound Recurrence: 0.3x0.3x0.1 N/A N/A Measurements L x W x D (cm) 0.071 N/A N/A A (cm) : rea 0.007 N/A N/A Volume (cm) : Full Thickness Without Exposed N/A N/A Classification: Support Structures Medium N/A N/A Exudate A mount: Sanguinous N/A N/A Exudate Type: red N/A N/A Exudate Color: Small (1-33%) N/A N/A Granulation A mount: Red, Pink N/A N/A Granulation Quality: Medium (34-66%) N/A N/A Necrotic A mount: Eschar, Adherent Slough N/A N/A Necrotic Tissue: None N/A N/A Epithelialization: Callus: Yes N/A N/A Periwound Skin Texture: Dry/Scaly: Yes N/A N/A Periwound Skin Moisture: No Abnormalities Noted N/A N/A Periwound Skin Color: No Abnormality N/A N/A Temperature: Yes N/A N/A Tenderness on Palpation: Compression Therapy N/A N/A Procedures Performed: Treatment Notes Mcnicholas, Finnis J ([id number]) [id number] Page 6 of 10 Electronic Signature(s) Signed: 01/25/2022 2:53:06 PM By: Duanne Guess MD FACS Entered By: Duanne Guess on 01/25/2022  14:53:06 -------------------------------------------------------------------------------- Multi-Disciplinary Care Plan Details Patient Name: Date of Service: [name] [name], [name] 01/25/2022 1:15 PM Medical Record Number: [id number] Patient Account Number: [id number] Date of Birth/Sex: Treating RN: Sep 25, 1945 (77 y.o. Valma Cava Primary Care Cortlin Marano: Docia Chuck, [name]: Referring Aaralynn Shepheard: Treating Janeice Stegall/Extender: Leroy Libman, [name] Weeks in Treatment: 0 Active Inactive Electronic Signature(s) Signed: 03/05/2022 4:55:53 PM By: Tommie Ard RN Signed: 09/20/2022 9:38:11 AM By: Fonnie Mu RN Previous Signature: 01/25/2022 2:32:55 PM Version By: Tommie Ard RN Entered By: Fonnie Mu on 03/05/2022 14:05:57 -------------------------------------------------------------------------------- Pain Assessment Details Patient Name: Date of Service: [name] [name], [name] 01/25/2022 1:15 PM Medical Record Number: [id number] Patient Account Number: [id number] Date of Birth/Sex: Treating RN: [birth year] (77 y.o. Valma Cava Primary Care Marcianna Daily: Docia Chuck, [name]: Referring Shannon Balthazar: Treating Makennah Omura/Extender: Leroy Libman, [name] Weeks in Treatment: 0 Active Problems Location of Pain Severity and Description of Pain Patient Has Paino No Site Locations Rate the pain. Current Pain Level: 0 Pain Management and Medication Current Pain Management: Gorka, Blessed J ([id number]) [phone].pdf Page 7 of 10 Electronic Signature(s) Signed: 01/25/2022 4:47:13 PM By: Tommie Ard RN Entered By: Tommie Ard on 01/25/2022 14:23:37 -------------------------------------------------------------------------------- Patient/Caregiver Education Details Patient Name: Date of Service: [name] [name] 1/18/2024andnbsp1:15 PM Medical Record Number: [id number] Patient Account Number:  [id number] Date of Birth/Gender: Treating RN: September 18, 1945 (77 y.o. Valma Cava Primary Care Physician: Docia Chuck, [place] Other Clinician: Referring Physician: Treating Physician/Extender: Leroy Libman, [name] Weeks in Treatment: 0 Education Assessment Education Provided To: Patient Education Topics Provided Wound Debridement: Methods: Explain/Verbal Responses: Reinforcements  needed, State content correctly Wound/Skin Impairment: Methods: Explain/Verbal Responses: Reinforcements needed, State content correctly Electronic Signature(s) Signed: 01/25/2022 4:47:13 PM By: Tommie Ard RN Entered By: Tommie Ard on 01/25/2022 14:33:10 -------------------------------------------------------------------------------- Wound Assessment Details Patient Name: Date of Service: GURVEER, MANNINGS IN J. 01/25/2022 1:15 PM Medical Record Number: [id number] Patient Account Number: [id number] Date of Birth/Sex: Treating RN: February 17, 1945 (77 y.o. Valma Cava Primary Care Hiya Point: Docia Chuck, [name]: Referring Erika Hussar: Treating Luma Clopper/Extender: Leroy Libman, [name] Weeks in Treatment: 0 Wound Status Wound Number: 2 Primary Venous Leg Ulcer Etiology: Wound Location: Left, Medial Foot Wound Open Wounding Event: Laceration Status: Date Acquired: 01/25/2022 Comorbid Chronic Obstructive Pulmonary Disease (COPD), Coronary Weeks Of Treatment: 0 History: Artery Disease, Hypertension, Neuropathy Clustered Wound: No Photos Kreiter, Deago J ([id number]) [id number] Page 8 of 10 Wound Measurements Length: (cm) 0.3 Width: (cm) 0.3 Depth: (cm) 0.1 Area: (cm) 0.071 Volume: (cm) 0.007 % Reduction in Area: % Reduction in Volume: Epithelialization: None Tunneling: No Undermining: No Wound Description Classification: Full Thickness Without Exposed Suppor Exudate Amount: Medium Exudate Type: Sanguinous Exudate Color: red t  Structures Foul Odor After Cleansing: No Slough/Fibrino Yes Wound Bed Granulation Amount: Small (1-33%) Granulation Quality: Red, Pink Necrotic Amount: Medium (34-66%) Necrotic Quality: Eschar, Adherent Slough Periwound Skin Texture Texture Color No Abnormalities Noted: No No Abnormalities Noted: Yes Callus: Yes Temperature / Pain Temperature: No Abnormality Moisture No Abnormalities Noted: No Tenderness on Palpation: Yes Dry / Scaly: Yes Electronic Signature(s) Signed: 01/25/2022 4:47:13 PM By: Tommie Ard RN Entered By: Tommie Ard on 01/25/2022 14:23:06 -------------------------------------------------------------------------------- Wound Assessment Details Patient Name: Date of Service: HUBBERT, DEGRAW IN J. 01/25/2022 1:15 PM Medical Record Number: [id number] Patient Account Number: [id number] Date of Birth/Sex: Treating RN: 07/02/1945 (77 y.o. Valma Cava Primary Care Hutson Luft: Docia Chuck, [name]: Referring Darletta Noblett: Treating Rocsi Hazelbaker/Extender: Leroy Libman, [name] Weeks in Treatment: 0 Wound Status Wound Number: 3 Primary Venous Leg Ulcer Etiology: Wound Location: Left, Medial Lower Leg Wound Open Wounding Event: Gradually Appeared Status: Date Acquired: 07/18/2021 Comorbid Chronic Obstructive Pulmonary Disease (COPD), Coronary Weeks Of Treatment: 0 History: Artery Disease, Hypertension, Neuropathy Clustered Wound: No Wound Measurements Length: (cm) 2 Width: (cm) 1.8 Depth: (cm) 0.1 Ebersole, Shreyan J ([id number]) Area: (cm) Volume: (cm) % Reduction in Area: % Reduction in Volume: Epithelialization: Small (1-33%) [id number] Page 9 of 10 2.827 Tunneling: No 0.283 Undermining: No Wound Description Classification: Full Thickness Without Exposed Support Structures Exudate Amount: Medium Exudate Type: Serous Exudate Color: amber Foul Odor After Cleansing: No Slough/Fibrino Yes Wound  Bed Granulation Amount: Large (67-100%) Exposed Structure Necrotic Amount: Small (1-33%) Fascia Exposed: No Necrotic Quality: Adherent Slough Fat Layer (Subcutaneous Tissue) Exposed: Yes Tendon Exposed: No Muscle Exposed: No Joint Exposed: No Bone Exposed: No Periwound Skin Texture Texture Color No Abnormalities Noted: Yes No Abnormalities Noted: No Rubor: Yes Moisture No Abnormalities Noted: No Temperature / Pain Dry / Scaly: Yes Temperature: No Abnormality Maceration: No Tenderness on Palpation: Yes Electronic Signature(s) Signed: 01/25/2022 4:38:59 PM By: Tommie Ard RN Entered By: Tommie Ard on 01/25/2022 16:38:58 -------------------------------------------------------------------------------- Wound Assessment Details Patient Name: Date of Service: JAKSEN, AUDE IN J. 01/25/2022 1:15 PM Medical Record Number: [id number] Patient Account Number: [id number] Date of Birth/Sex: Treating RN: 31-Jan-1945 (77 y.o. Valma Cava Primary Care Gwenyth Dingee: Docia Chuck, [name]: Referring Jossalin Chervenak: Treating Krystine Pabst/Extender: Leroy Libman, [name] Weeks in Treatment: 0 Wound Status Wound Number: 4 Primary Venous Leg Ulcer Etiology: Wound Location: Left, Lateral Lower Leg Wound Open Wounding Event: Gradually Appeared Status: Date  Acquired: 07/18/2021 Comorbid Chronic Obstructive Pulmonary Disease (COPD), Coronary Weeks Of Treatment: 0 History: Artery Disease, Hypertension, Neuropathy Clustered Wound: No Wound Measurements Length: (cm) 2.5 Width: (cm) 2 Depth: (cm) 0.1 Area: (cm) 3.927 Volume: (cm) 0.393 % Reduction in Area: % Reduction in Volume: Epithelialization: Small (1-33%) Tunneling: No Undermining: No Wound Description Classification: Full Thickness Without Exposed Support Structures Exudate Amount: Medium Exudate Type: Serous Exudate Color: amber Foul Odor After Cleansing: No Slough/Fibrino Yes Wound Bed Granulation Amount:  Large (67-100%) Necrotic Amount: Small (1-33%) Necrotic Quality: [address] [place] J ([id number]) [id number] Page 10 of 10 Periwound Skin Texture Texture Color No Abnormalities Noted: Yes No Abnormalities Noted: No Moisture Temperature / Pain No Abnormalities Noted: No Temperature: No Abnormality Tenderness on Palpation: Yes Electronic Signature(s) Signed: 01/25/2022 4:40:46 PM By: Tommie Ard RN Entered By: Tommie Ard on 01/25/2022 16:40:46 -------------------------------------------------------------------------------- Vitals Details Patient Name: Date of Service: [name] Dubonnet IN J. 01/25/2022 1:15 PM Medical Record Number: [id number] Patient Account Number: [id number] Date of Birth/Sex: Treating RN: 1945/09/22 (77 y.o. M) Primary Care Anayelli Lai: Docia Chuck, [name]: Referring Laren Orama: Treating Daelyn Pettaway/Extender: Leroy Libman, [name] Weeks in Treatment: 0 Vital Signs Time Taken: 01:30 Temperature (F): 97.4 Height (in): 71 Pulse (bpm): 106 Weight (lbs): 225 Respiratory Rate (breaths/min): 24 Body Mass Index (BMI): 31.4 Blood Pressure (mmHg): 137/64 Reference Range: 80 - 120 mg / dl Electronic Signature(s) Signed: 01/25/2022 4:47:13 PM By: Tommie Ard RN Entered By: Tommie Ard on 01/25/2022 13:49:01

## 2022-01-26 NOTE — Progress Notes (Signed)
Dibbern, Abdon [name] ([id number]) [id number] Page 1 of 8 Visit Report for 01/25/2022 Chief Complaint Document Details Patient Name: Date of Service: [name] [name], [name] 01/25/2022 1:15 PM Medical Record Number: [id number] Patient Account Number: [id number] Date of Birth/Sex: Treating RN: Aug 14, 1945 (77 y.o. M) Primary Care Provider: [name], [name]: Referring Provider: Treating Provider/Extender: Adrian Prows, [name] Weeks in Treatment: 0 Information Obtained from: Patient Chief Complaint Patient seen for complaints of Non-Healing Wound. Electronic Signature(s) Signed: 01/25/2022 2:53:12 PM By: [name] MD FACS Entered By: [name] on 01/25/2022 14:53:11 -------------------------------------------------------------------------------- HPI Details Patient Name: Date of Service: [name] [name], [name] 01/25/2022 1:15 PM Medical Record Number: [id number] Patient Account Number: [id number] Date of Birth/Sex: Treating RN: 03-10-1945 (77 y.o. M) Primary Care Provider: [name], [name]: Referring Provider: Treating Provider/Extender: Adrian Prows, [name] Weeks in Treatment: 0 History of Present Illness HPI Description: ADMISSION 03/21/21 This is a 77 year old man with a past medical history notable for COPD and chronic hypoxic respiratory failure, requiring 4 L of oxygen, coronary artery disease, hypertension, and bilateral lower extremity chronic edema. On February 9, he struck his right leg with a car door which opened up a wound. It has failed to heal since that time. He saw a dermatologist that prescribed MetroGel, with little improvement. He was placed on Keflex and has a few days left of this antibiotic. He does wear support stockings on a regular basis, but the compression is fairly low. ABI in clinic today was 1.29. The wound itself has thick yellow slough, but no surrounding erythema or  induration. No foul odor. He does not have significant pain. 03/27/2021: Last week, I took a PCR culture which was positive for Enterococcus faecalis. I initiated him on Augmentin and he is tolerating this well. Today, the wound is much cleaner and is coming in size. He did not love the 4-layer compression, but he is willing to tolerate it to get the wound closed. He has been in Iodoflex under the compression wrap. 04/03/2021: Today, he is complaining fairly bitterly about his compression wraps. He says that they are too tight and he feels like his toes go numb from time to time. However, the wound is smaller and has significantly less slough. We have been using Iodoflex under the compression wrap. 04/10/2021: Last week, we changed his dressing to Coflex. He says that although he really does not like the compression wraps, this 1 was more tolerable. The wound is much smaller with good epithelial skin coming around the periphery and is very clean. We have been using Iodoflex under the Coflex compression. 04/17/2021: This past Friday, the patient was concerned at how swollen his knee was and he went to his primary care office. They sent him to the ER for DVT scan and prescribed doxycycline for possible cellulitis. In the emergency department, they explained that he was in compression to try and heal his wound and minimize the fluid buildup. His DVT scan was negative. The ER provider did not think it looked like he had cellulitis, as he had similar skin changes bilaterally. The patient has not filled the doxycycline prescription. His wound was wrapped to just below the tibial tuberosity; his swelling begins just proximal to this, as would be expected. His edema control aside from where he was wrapped is quite poor with 4+ pitting edema. He apparently takes only 20 mg of Lasix daily, with instructions to increase to double if he has too much fluid, but he has not done  so. 04/24/2021: He took his wrap off on  Saturday in order to undergo nail trimming. His leg is quite edematous today, but the wound is nearly closed. 05/01/2021: His wound has healed. READMISSION 01/25/2022 [name] [name], [name] [name] ([id number]) [id number] Page 2 of 8 He returns to clinic with 2 open areas on his left lower leg. They are draining clear serous fluid. He apparently has had home health services that have been applying Unna boots, but they are no longer coming. Electronic Signature(s) Signed: 01/25/2022 2:54:55 PM By: [name] MD FACS Entered By: [name] on 01/25/2022 14:54:55 -------------------------------------------------------------------------------- Physical Exam Details Patient Name: Date of Service: [name] [name], [name] 01/25/2022 1:15 PM Medical Record Number: [id number] Patient Account Number: [id number] Date of Birth/Sex: Treating RN: 10/27/1945 (77 y.o. M) Primary Care Provider: [name], [name]: Referring Provider: Treating Provider/Extender: Adrian Prows, [name] Weeks in Treatment: 0 Constitutional . Slightly tachycardic. . . No acute distress. Respiratory Normal work of breathing on supplemental oxygen. Notes 01/25/2022: Edema control is actually quite good for him. His skin is very dry and scaly. There are 2 patches of skin that show evidence of drainage, 1 on the lateral aspect of his leg and 1 just below the ankle on the medial aspect. There is a small open wound on his medial foot. No concern for infection. Electronic Signature(s) Signed: 01/25/2022 2:56:18 PM By: [name] MD FACS Entered By: [name] on 01/25/2022 14:56:18 -------------------------------------------------------------------------------- Physician Orders Details Patient Name: Date of Service: [name] [name], [name] 01/25/2022 1:15 PM Medical Record Number: [id number] Patient Account Number: [id number] Date of Birth/Sex: Treating RN: September 23, 1945  (77 y.o. Waldron SessionWaldron Session Primary Care Provider: [name], [name]: Referring Provider: Treating Provider/Extender: Adrian Prows, [name] Weeks in Treatment: 0 Verbal / Phone Orders: No Diagnosis Coding ICD-10 Coding Code Description [id number] Non-pressure chronic ulcer of other part of left lower leg limited to breakdown of skin J44.9 Chronic obstructive pulmonary disease, unspecified Z99.81 Dependence on supplemental oxygen R60.0 Localized edema Follow-up Appointments ppointment in 1 week. - Dr. Celine Ahr Return A Anesthetic Wound #2 Left,Medial Foot (In clinic) Topical Lidocaine 5% applied to wound bed - prior to debridement Gelpi, Benjimen [name] ([id number]) [id number] Page 3 of 8 Edema Control - Lymphedema / SCD / Other Avoid standing for long periods of time. Patient to wear own compression stockings every day. - Left Leg Exercise regularly Wound Treatment Wound #2 - Foot Wound Laterality: Left, Medial Cleanser: Soap and Water Discharge Instructions: May shower and wash wound with dial antibacterial soap and water prior to dressing change. Cleanser: Wound Cleanser Discharge Instructions: Cleanse the wound with wound cleanser prior to applying a clean dressing using gauze sponges, not tissue or cotton balls. Prim Dressing: Sorbalgon AG Dressing 2x2 (in/in) ary Discharge Instructions: Apply to wound bed as instructed Secondary Dressing: Zetuvit Plus 4x8 in Discharge Instructions: Apply over primary dressing as directed. Secured With: Child psychotherapist, Sterile 2x75 (in/in) Discharge Instructions: Secure with stretch gauze as directed. Secured With: Transpore Surgical Tape, 2x10 (in/yd) Discharge Instructions: Secure dressing with tape as directed. Compression Wrap: Unnaboot w/Calamine, 4x10 (in/yd) Discharge Instructions: Apply Unnaboot as directed. Electronic Signature(s) Signed: 01/25/2022 4:47:13 PM By:  Blanche East RN Signed: 01/26/2022 7:36:34 AM By: [name] MD FACS Entered By: Blanche East on 01/25/2022 14:59:11 -------------------------------------------------------------------------------- Problem List Details Patient Name: Date of Service: JAESHAWN, SILVIO IN [name]. 01/25/2022 1:15 PM Medical Record Number: [id number] Patient Account Number: [id number] Date of Birth/Sex: Treating RN: Oct 23, 1945 (77 y.o. M) Primary  Care Provider: [name], [name]: Referring Provider: Treating Provider/Extender: Adrian Prows, [name] Weeks in Treatment: 0 Active Problems ICD-10 Encounter Code Description Active Date MDM Diagnosis L97.821 Non-pressure chronic ulcer of other part of left lower leg limited to breakdown 01/25/2022 No Yes of skin J44.9 Chronic obstructive pulmonary disease, unspecified 01/25/2022 No Yes Z99.81 Dependence on supplemental oxygen 01/25/2022 No Yes R60.0 Localized edema 01/25/2022 No Yes Inactive Problems Michelle, Idriss [name] ([id number]) [id number] Page 4 of 8 Resolved Problems Electronic Signature(s) Signed: 01/25/2022 4:30:35 PM By: Blanche East RN Signed: 01/26/2022 7:36:34 AM By: [name] MD FACS Previous Signature: 01/25/2022 2:52:55 PM Version By: [name] MD FACS Previous Signature: 01/25/2022 2:43:26 PM Version By: [name] MD FACS Previous Signature: 01/25/2022 2:33:20 PM Version By: Blanche East RN Previous Signature: 01/25/2022 1:52:08 PM Version By: [name] MD FACS Entered By: Blanche East on 01/25/2022 16:30:35 -------------------------------------------------------------------------------- Progress Note Details Patient Name: Date of Service: [name] [name], [name] 01/25/2022 1:15 PM Medical Record Number: [id number] Patient Account Number: [id number] Date of Birth/Sex: Treating RN: [birth year] (77 y.o. M) Primary Care Provider: [name], [name]  Clinician: Referring Provider: Treating Provider/Extender: Adrian Prows, [name] Weeks in Treatment: 0 Subjective Chief Complaint Information obtained from Patient Patient seen for complaints of Non-Healing Wound. History of Present Illness (HPI) ADMISSION 03/21/21 This is a 77 year old man with a past medical history notable for COPD and chronic hypoxic respiratory failure, requiring 4 L of oxygen, coronary artery disease, hypertension, and bilateral lower extremity chronic edema. On February 9, he struck his right leg with a car door which opened up a wound. It has failed to heal since that time. He saw a dermatologist that prescribed MetroGel, with little improvement. He was placed on Keflex and has a few days left of this antibiotic. He does wear support stockings on a regular basis, but the compression is fairly low. ABI in clinic today was 1.29. The wound itself has thick yellow slough, but no surrounding erythema or induration. No foul odor. He does not have significant pain. 03/27/2021: Last week, I took a PCR culture which was positive for Enterococcus faecalis. I initiated him on Augmentin and he is tolerating this well. Today, the wound is much cleaner and is coming in size. He did not love the 4-layer compression, but he is willing to tolerate it to get the wound closed. He has been in Iodoflex under the compression wrap. 04/03/2021: Today, he is complaining fairly bitterly about his compression wraps. He says that they are too tight and he feels like his toes go numb from time to time. However, the wound is smaller and has significantly less slough. We have been using Iodoflex under the compression wrap. 04/10/2021: Last week, we changed his dressing to Coflex. He says that although he really does not like the compression wraps, this 1 was more tolerable. The wound is much smaller with good epithelial skin coming around the periphery and is very clean. We have been using  Iodoflex under the Coflex compression. 04/17/2021: This past Friday, the patient was concerned at how swollen his knee was and he went to his primary care office. They sent him to the ER for DVT scan and prescribed doxycycline for possible cellulitis. In the emergency department, they explained that he was in compression to try and heal his wound and minimize the fluid buildup. His DVT scan was negative. The ER provider did not think it looked like he had cellulitis, as he had similar skin changes bilaterally.  The patient has not filled the doxycycline prescription. His wound was wrapped to just below the tibial tuberosity; his swelling begins just proximal to this, as would be expected. His edema control aside from where he was wrapped is quite poor with 4+ pitting edema. He apparently takes only 20 mg of Lasix daily, with instructions to increase to double if he has too much fluid, but he has not done so. 04/24/2021: He took his wrap off on Saturday in order to undergo nail trimming. His leg is quite edematous today, but the wound is nearly closed. 05/01/2021: His wound has healed. READMISSION 01/25/2022 He returns to clinic with 2 open areas on his left lower leg. They are draining clear serous fluid. He apparently has had home health services that have been applying Unna boots, but they are no longer coming. Patient History Information obtained from Patient, Chart. Allergies fluticasone salmetrol (Reaction: SOB) Family History Cancer - Father, Diabetes - Paternal Grandparents, Thyroid Problems - Mother, No family history of Heart Disease, Hereditary Spherocytosis, Hypertension, Kidney Disease, Lung Disease, Seizures, Stroke, Tuberculosis. Social History Former smoker, Marital Status - Divorced, Alcohol Use - Daily, Drug Use - No History, Caffeine Use - Daily. [name] [name], [name] [name] ([id number]) [id number] Page 5 of 8 Medical History Eyes Denies history of  Cataracts, Glaucoma, Optic Neuritis Ear/Nose/Mouth/Throat Denies history of Chronic sinus problems/congestion, Middle ear problems Respiratory Patient has history of Chronic Obstructive Pulmonary Disease (COPD) Cardiovascular Patient has history of Coronary Artery Disease, Hypertension Denies history of Angina, Arrhythmia, Congestive Heart Failure, Hypotension, Myocardial Infarction, Peripheral Arterial Disease, Peripheral Venous Disease, Phlebitis, Vasculitis Gastrointestinal Denies history of Cirrhosis , Colitis, Crohnoos, Hepatitis A, Hepatitis B, Hepatitis C Integumentary (Skin) Denies history of History of Burn Neurologic Patient has history of Neuropathy Medical A Surgical History Notes nd Respiratory O2'@4l'$  Review of Systems (ROS) Constitutional Symptoms (General Health) Denies complaints or symptoms of Fatigue, Fever, Chills, Marked Weight Change. Eyes Complains or has symptoms of Glasses / Contacts - readers. Denies complaints or symptoms of Dry Eyes, Vision Changes. Ear/Nose/Mouth/Throat Denies complaints or symptoms of Chronic sinus problems or rhinitis. Respiratory Complains or has symptoms of Shortness of Breath. Denies complaints or symptoms of Chronic or frequent coughs. Gastrointestinal Denies complaints or symptoms of Frequent diarrhea, Nausea, Vomiting. Objective Constitutional Slightly tachycardic. No acute distress. Vitals Time Taken: 1:30 AM, Height: 71 in, Weight: 225 lbs, BMI: 31.4, Temperature: 97.4 F, Pulse: 106 bpm, Respiratory Rate: 24 breaths/min, Blood Pressure: 137/64 mmHg. Respiratory Normal work of breathing on supplemental oxygen. General Notes: 01/25/2022: Edema control is actually quite good for him. His skin is very dry and scaly. There are 2 patches of skin that show evidence of drainage, 1 on the lateral aspect of his leg and 1 just below the ankle on the medial aspect. There is a small open wound on his medial foot. No concern  for infection. Integumentary (Hair, Skin) Wound #2 status is Open. Original cause of wound was Laceration. The date acquired was: 01/25/2022. The wound is located on the Left,Medial Foot. The wound measures 0.3cm length x 0.3cm width x 0.1cm depth; 0.071cm^2 area and 0.007cm^3 volume. There is no tunneling or undermining noted. There is a medium amount of sanguinous drainage noted. There is small (1-33%) red, pink granulation within the wound bed. There is a medium (34-66%) amount of necrotic tissue within the wound bed including Eschar and Adherent Slough. The periwound skin appearance had no abnormalities noted for color. The periwound skin appearance exhibited: Callus, Dry/Scaly. Periwound temperature was noted  as No Abnormality. The periwound has tenderness on palpation. 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 There was a Haematologist Compression Therapy Procedure by Blanche East, RN. Post procedure Diagnosis Wound #2: Same as Pre-Procedure Plan Follow-up Appointments: Return Appointment in 1 week. - Dr. Celine Ahr Anesthetic: Wound #2 Left,Medial Foot: (In clinic) Topical Lidocaine 5% applied to wound bed - prior to debridement Edema Control - Lymphedema / SCD / Other: Avoid standing for long periods of time. Patient to wear own compression stockings every day. - Left Leg Exercise regularly WOUND #2: - Foot Wound Laterality: Left, Medial Cleanser: Soap and Water Discharge Instructions: May shower and wash wound with dial antibacterial soap and water prior to dressing change. Cleanser: Wound Cleanser Discharge  Instructions: Cleanse the wound with wound cleanser prior to applying a clean dressing using gauze sponges, not tissue or cotton balls. Prim Dressing: Sorbalgon AG Dressing 2x2 (in/in) ary Discharge Instructions: Apply to wound bed as instructed Secondary Dressing: Zetuvit Plus 4x8 in Discharge Instructions: Apply over primary dressing as directed. Secured With: Child psychotherapist, Sterile 2x75 (in/in) Discharge Instructions: Secure with stretch gauze as directed. Secured With: Transpore Surgical T ape, 2x10 (in/yd) Discharge Instructions: Secure dressing with tape as directed. Com pression Wrap: Unnaboot w/Calamine, 4x10 (in/yd) Discharge Instructions: Apply Unnaboot as directed. 01/25/2022: He has returned with new superficial wounds on his left lower leg. He became fairly aggressive and combative regarding any treatment or intervention and would not permit me to proceed treating him without knowing the precise cost of any procedures or treatments. I told him I did not have that information available. He demanded the codes so that he could contact his insurance company. These were provided. He declined any further intervention today and said that he would reschedule when he was ready to be treated for his wounds. Electronic Signature(s) Signed: 01/25/2022 4:30:49 PM By: Blanche East RN Signed: 01/26/2022 7:36:34 AM By: [name] MD FACS Previous Signature: 01/25/2022 2:58:01 PM Version By: [name] MD FACS Entered By: Blanche East on 01/25/2022 16:30:49 -------------------------------------------------------------------------------- HxROS Details Patient Name: Date of Service: [name] [name], [name] 01/25/2022 1:15 PM Medical Record Number: [id number] Patient Account Number: [id number] Date of Birth/Sex: Treating RN: [birth year] (77 y.o. Waldron Session Primary Care Provider: [name], [name]: Referring Provider: Treating Provider/Extender:  Adrian Prows, [name] Weeks in Treatment: 0 Information Obtained From Patient Chart Constitutional Symptoms (General Health) Complaints and Symptoms: Negative for: Fatigue; Fever; Chills; Marked Weight Change Eyes Complaints and Symptoms: Positive for: Glasses / Contacts - readers Negative for: Dry Eyes; Vision Changes Medical HistoryVALMORE, [name] [name] ([id number]) [id number] Page 7 of 8 Negative for: Cataracts; Glaucoma; Optic Neuritis Ear/Nose/Mouth/Throat Complaints and Symptoms: Negative for: Chronic sinus problems or rhinitis Medical History: Negative for: Chronic sinus problems/congestion; Middle ear problems Respiratory Complaints and Symptoms: Positive for: Shortness of Breath Negative for: Chronic or frequent coughs Medical History: Positive for: Chronic Obstructive Pulmonary Disease (COPD) Past Medical History Notes: O2'@4l'$  Gastrointestinal Complaints and Symptoms: Negative for: Frequent diarrhea; Nausea; Vomiting Medical History: Negative for: Cirrhosis ; Colitis; Crohns; Hepatitis A; Hepatitis B; Hepatitis C Cardiovascular Medical History: Positive for: Coronary Artery Disease; Hypertension Negative for: Angina; Arrhythmia; Congestive Heart Failure;  Hypotension; Myocardial Infarction; Peripheral Arterial Disease; Peripheral Venous Disease; Phlebitis; Vasculitis Integumentary (Skin) Medical History: Negative for: History of Burn Neurologic Medical History: Positive for: Neuropathy Immunizations Pneumococcal Vaccine: Received Pneumococcal Vaccination: No Implantable Devices None Family and Social History Cancer: Yes - Father; Diabetes: Yes - Paternal Grandparents; Heart Disease: No; Hereditary Spherocytosis: No; Hypertension: No; Kidney Disease: No; Lung Disease: No; Seizures: No; Stroke: No; Thyroid Problems: Yes - Mother; Tuberculosis: No; Former smoker; Marital Status - Divorced; Alcohol Use: Daily; Drug Use:  No History; Caffeine Use: Daily; Financial Concerns: No; Food, Clothing or Shelter Needs: No; Support System Lacking: No; Transportation Concerns: No Electronic Signature(s) Signed: 01/25/2022 4:47:13 PM By: Blanche East RN Signed: 01/26/2022 7:36:34 AM By: [name] MD FACS Entered By: Blanche East on 01/25/2022 14:02:45 -------------------------------------------------------------------------------- SuperBill Details Patient Name: Date of Service: Kathyrn Lass IN [name]. 01/25/2022 Medical Record Number: [id number] Patient Account Number: [id number] Date of Birth/Sex: Treating RN: 05/11/1945 (77 y.o. Naszir, Cott [name] [name] ([id number]) [id number] Page 8 of 8 Primary Care Provider: [place], [name]: Referring Provider: Treating Provider/Extender: Adrian Prows, [name] Weeks in Treatment: 0 Diagnosis Coding ICD-10 Codes Code Description [phone] Non-pressure chronic ulcer of other part of left lower leg limited to breakdown of skin J44.9 Chronic obstructive pulmonary disease, unspecified Z99.81 Dependence on supplemental oxygen R60.0 Localized edema Facility Procedures : CPT4 Code: 21308657 Description: 84696 - WOUND CARE VISIT-LEV 4 EST PT Modifier: 25 Quantity: 1 : CPT4 Code: 29528413 Description: (Facility Use Only) [id number] - [facility] COMPRS LWR LT LEG ICD-10 Diagnosis Description L97.821 Non-pressure chronic ulcer of other part of left lower leg limited to breakdown of sk Modifier: in Quantity: 1 Physician Procedures : CPT4 Code Description Modifier [id number] 99213 - WC PHYS LEVEL 3 - EST PT ICD-10 Diagnosis Description L97.821 Non-pressure chronic ulcer of other part of left lower leg limited to breakdown of skin J44.9 Chronic obstructive pulmonary disease,  unspecified Z99.81 Dependence on supplemental oxygen R60.0 Localized edema Quantity: 1 Electronic Signature(s) Signed: 01/25/2022 4:30:26 PM By: Blanche East RN Signed: 01/26/2022 7:36:34 AM By: [name] MD FACS Previous Signature: 01/25/2022 4:29:37 PM Version By: Blanche East RN Previous Signature: 01/25/2022 2:58:39 PM Version By: [name] MD FACS Entered By: Blanche East on 01/25/2022 16:30:26

## 2022-01-30 ENCOUNTER — Ambulatory Visit: Payer: PPO | Admitting: General Practice

## 2022-01-31 ENCOUNTER — Ambulatory Visit (HOSPITAL_BASED_OUTPATIENT_CLINIC_OR_DEPARTMENT_OTHER): Payer: PPO | Admitting: General Surgery

## 2022-01-31 DIAGNOSIS — J439 Emphysema, unspecified: Secondary | ICD-10-CM | POA: Diagnosis not present

## 2022-01-31 DIAGNOSIS — J42 Unspecified chronic bronchitis: Secondary | ICD-10-CM | POA: Diagnosis not present

## 2022-01-31 DIAGNOSIS — J9611 Chronic respiratory failure with hypoxia: Secondary | ICD-10-CM | POA: Diagnosis not present

## 2022-01-31 DIAGNOSIS — J449 Chronic obstructive pulmonary disease, unspecified: Secondary | ICD-10-CM | POA: Diagnosis not present

## 2022-02-05 ENCOUNTER — Encounter: Payer: Self-pay | Admitting: Primary Care

## 2022-02-05 ENCOUNTER — Ambulatory Visit: Payer: PPO | Admitting: Primary Care

## 2022-02-05 ENCOUNTER — Ambulatory Visit (INDEPENDENT_AMBULATORY_CARE_PROVIDER_SITE_OTHER): Payer: PPO

## 2022-02-05 VITALS — BP 132/62 | HR 94 | Temp 97.9°F | Ht 71.0 in | Wt 225.2 lb

## 2022-02-05 DIAGNOSIS — J9611 Chronic respiratory failure with hypoxia: Secondary | ICD-10-CM

## 2022-02-05 DIAGNOSIS — R0602 Shortness of breath: Secondary | ICD-10-CM

## 2022-02-05 DIAGNOSIS — J4489 Other specified chronic obstructive pulmonary disease: Secondary | ICD-10-CM

## 2022-02-05 DIAGNOSIS — M7989 Other specified soft tissue disorders: Secondary | ICD-10-CM | POA: Diagnosis not present

## 2022-02-05 DIAGNOSIS — J439 Emphysema, unspecified: Secondary | ICD-10-CM

## 2022-02-05 DIAGNOSIS — J441 Chronic obstructive pulmonary disease with (acute) exacerbation: Secondary | ICD-10-CM | POA: Diagnosis not present

## 2022-02-05 LAB — CBC WITH DIFFERENTIAL/PLATELET
Basophils Absolute: 0.1 10*3/uL (ref 0.0–0.1)
Basophils Relative: 0.9 % (ref 0.0–3.0)
Eosinophils Absolute: 0.1 10*3/uL (ref 0.0–0.7)
Eosinophils Relative: 1.2 % (ref 0.0–5.0)
HCT: 44.7 % (ref 39.0–52.0)
Hemoglobin: 15.4 g/dL (ref 13.0–17.0)
Lymphocytes Relative: 13.1 % (ref 12.0–46.0)
Lymphs Abs: 1.3 10*3/uL (ref 0.7–4.0)
MCHC: 34.4 g/dL (ref 30.0–36.0)
MCV: 103.8 fl — ABNORMAL HIGH (ref 78.0–100.0)
Monocytes Absolute: 0.8 10*3/uL (ref 0.1–1.0)
Monocytes Relative: 8.1 % (ref 3.0–12.0)
Neutro Abs: 7.5 10*3/uL (ref 1.4–7.7)
Neutrophils Relative %: 76.7 % (ref 43.0–77.0)
Platelets: 207 10*3/uL (ref 150.0–400.0)
RBC: 4.3 Mil/uL (ref 4.22–5.81)
RDW: 14.2 % (ref 11.5–15.5)
WBC: 9.8 10*3/uL (ref 4.0–10.5)

## 2022-02-05 LAB — BASIC METABOLIC PANEL
BUN: 6 mg/dL (ref 6–23)
CO2: 31 mEq/L (ref 19–32)
Calcium: 9.5 mg/dL (ref 8.4–10.5)
Chloride: 89 mEq/L — ABNORMAL LOW (ref 96–112)
Creatinine, Ser: 0.64 mg/dL (ref 0.40–1.50)
GFR: 93.37 mL/min (ref >60.00)
Glucose, Bld: 106 mg/dL — ABNORMAL HIGH (ref 70–99)
Potassium: 3.9 mEq/L (ref 3.5–5.1)
Sodium: 131 mEq/L — ABNORMAL LOW (ref 135–145)

## 2022-02-05 LAB — BRAIN NATRIURETIC PEPTIDE: Pro B Natriuretic peptide (BNP): 18 pg/mL (ref 0.0–100.0)

## 2022-02-05 MED ORDER — PREDNISONE 10 MG PO TABS: ORAL_TABLET | ORAL | 0 refills | Status: DC

## 2022-02-05 MED ORDER — BREZTRI AEROSPHERE 160-9-4.8 MCG/ACT IN AERO: 2.0000 | INHALATION_SPRAY | Freq: Two times a day (BID) | RESPIRATORY_TRACT | 0 refills | Status: DC

## 2022-02-05 NOTE — Progress Notes (Signed)
Please let patient know CXR showed emphysema and finding consistent with chronic bronchitis. No pneumonia. I sent in pred taper. Waiting for labs. Take mucinex over the counter twice daily.

## 2022-02-05 NOTE — Assessment & Plan Note (Signed)
-  Continue Lasix 20 mg day (we may increase dose for a couple of days if kidney function is ok on labs to help with swelling)  - Elevate right leg and continue Unna boot wraps. Notify [facility] if you develop fever, open sores or redness

## 2022-02-05 NOTE — Patient Instructions (Addendum)
Recommendations: - Continue Breztri Aerosphere two puffs twice daily  (if not covered, we can change back to Trelegy just let us know) - Continue Lasix 20 mg day (we may increase dose for a couple of days if kidney function is ok on labs to help with swelling)  - Elevate right leg and continue Unna boot wraps. Notify PCPA if you develop fever, open sores or redness   Rx: - Prednisone taper as directed  Orders: - CXR today and labs (ordered) - Walk on 3L oxygen   Follow-up: - 3 months with Dr. [name] ([place] location)

## 2022-02-05 NOTE — Assessment & Plan Note (Addendum)
-  Patient reports dyspnea and chest tightness last several days. Improved today. He had wheezing on exam. Sending in prednisone taper. Checking labs and CXR. Continue Breztri Aerosphere two puffs twice daily  (if not covered, we can change back to Trelegy)  Rx: - Prednisone taper '40mg'$  x 2 days; '30mg'$  x 2 days; '20mg'$  x 2 days; '10mg'$  x 2 days  Orders: - CXR today and labs (ordered) - Walk on 3L oxygen   Follow-up: - 3 months with Dr. [name] ([place] location)

## 2022-02-05 NOTE — Progress Notes (Signed)
$'@Patient't$  ID: [name] [name], male    DOB: 06-10-1945, 77 y.o.   MRN: [id number]  Chief Complaint  Patient presents with   Follow-up    Had some chest tightness x 5 days.  Feels better today.  O2 dropping more than normal with exertion.  Leg edema with leaking.    Referring provider: Lujean Amel, MD  HPI: 77 year old male, former smoker quit in 2010 (80-pack-year history).  Past medical history significant for COPD.  Patient's been on portable oxygen since March 2021.  Completed pulmonary rehab in 2020.  Previous [facility] pulmonary encounter: 77-monthfollow-up visit He is able to obtain Breztri through the company but for some reason has run out over the past week and requests a sample. He feels like he is using oxygen more constantly now, uses 2 L continuous at home and uses POC 3 to 4 L when he goes out.  He is not good about using nocturnal oxygen and feels uncomfortable with nasal cannula in his nose.  I reviewed nocturnal oximetry from July.  Weight has increased 5 pounds over the past year to his current weight of 236 pounds he reports 1+ bipedal edema in spite of taking Lasix 20 mg daily.  Also note that he is on amlodipine I reviewed low-dose CT chest and last x-ray  06/21/2021 Patient presents today for a 6103-monthollow-up. Accompanied by his daughter. He feels his breathing has been progressively getting worse. No acute associated respiratory symptoms. He reports dry mouth/throat from oxygen use.  He uses his Breztri twice daily as prescribed, not consistently rinsing his mouth after use.  He uses his rescue inhaler 1-2 times a day. He is mostly compliant with oxygen. He will at times go without oxygen when at home and walking to the kitchen. On room air with exertion his O2 will drop into the 70s. O2 readings at home have been running between 90-94% on 2-2.5L at rest. He has done pulmonary rehab in the past, completed in 2020. He is open to returning to pulmonary rehab, he is  concerned about getting to his visit. He is taking lasix '20mg'$  daily, occasionally twice a day as needed for weight gain/leg swelling.Notes his HR has been more elevated last several days. No chest pain.    Accompanied by his daughter, uses 3 L POC and 2 L continuous at home. He feels he had a sudden decline in 2020 and not sure why, to the point of using oxygen 24/7. He was undergoing home PT but is now limited by leg edema and a leg ulcer for which she is seeing home health, his legs are wrapped today. Legs hurt and he takes up to 6 ibuprofens a day. Compliant with Breztri.  Breathing is at baseline.   02/05/2022 - interim hx  Patient presents today for follow-up. PMH COPD. He had some chest tightness for the last several days which has  resolved today. During that time he was noticing O2 dropping more than normal with exertion on oxygen. He uses 3L O2 24/7. He is needing to rest when doing daily activities at home. He has morning cough with clear mucus otherwise no significant cough. He has left lower extremity swelling and weeping. No open sores. Left leg is wrapped today with Unna boot. He was receiving home health which has stopped.   Significant tests/ events reviewed  07/2020 ONO /RA -desaturation less than 88% for 23 minutes  LDCT 07/05/20 showed lung RADS 2. Centrilobular and paraseptal emphysema ,  unchanged nodules compared to 2020   PFTs 09/2012 moderate reversible airway obstruction, ratio 49, FEV1 44%, improved to 56%/25% bronchodilator response, TLC 88%, DLCO 55%   Spirometry 03/18/2018 showed ratio of 61, FEV1 of 43% and FVC of 52%  Allergies  Allergen Reactions   Fluticasone-Salmeterol     Other reaction(s): worsening shortness of breath    Immunization History  Administered Date(s) Administered   Fluad Quad(high Dose 65+) 10/04/2021   Influenza Split 01/14/2012, 11/26/2012, 10/11/2020   Influenza,inj,Quad PF,6+ Mos 09/30/2015, 10/05/2016, 10/23/2017, 11/05/2018,  11/26/2019   Influenza,inj,Quad PF,6-35 Mos 11/09/2018   Influenza,inj,quad, With Preservative 09/18/2013, 09/27/2014   PFIZER(Purple Top)SARS-COV-2 Vaccination 02/13/2019, 03/06/2019, 03/23/2019, 04/23/2019   Pneumococcal Conjugate-13 09/18/2013   Pneumococcal Polysaccharide-23 09/27/2014   Tdap 10/24/2010   Zoster, Live 10/24/2010    Past Medical History:  Diagnosis Date   COPD (chronic obstructive pulmonary disease) ([place])     Tobacco History: Social History   Tobacco Use  Smoking Status Former   Packs/day: 2.00   Years: 40.00   Total pack years: 80.00   Types: Cigarettes   Quit date: 2010   Years since quitting: 14.0   Passive exposure: Never  Smokeless Tobacco Never   Counseling given: Not Answered   Outpatient Medications Prior to Visit  Medication Sig Dispense Refill   albuterol (VENTOLIN HFA) 108 (90 Base) MCG/ACT inhaler Inhale 2 puffs into the lungs every 6 (six) hours as needed for wheezing or shortness of breath. 8 g 5   ALPRAZolam (XANAX) 0.25 MG tablet Take 1 tablet (0.25 mg total) by mouth daily as needed. 30 tablet 0   amLODipine (NORVASC) 5 MG tablet Take 5 mg by mouth daily.     atorvastatin (LIPITOR) 40 MG tablet Take 40 mg by mouth daily. Pt takes two twice daily     Budeson-Glycopyrrol-Formoterol (BREZTRI AEROSPHERE) 160-9-4.8 MCG/ACT AERO Inhale 2 puffs into the lungs in the morning and at bedtime. 10.7 g 11   furosemide (LASIX) 20 MG tablet Take 20 mg by mouth daily.     ibuprofen (ADVIL,MOTRIN) 200 MG tablet Take 400 mg by mouth every 6 (six) hours as needed.     Multiple Vitamins-Minerals (MULTIVITAMIN WITH MINERALS) tablet Take 1 tablet by mouth daily.     POTASSIUM PO Take 20 mg by mouth daily.     sertraline (ZOLOFT) 25 MG tablet Take 25 mg by mouth daily.     vitamin B-12 (CYANOCOBALAMIN) 1000 MCG tablet Take 1,000 mcg by mouth.     No facility-administered medications prior to visit.   Review of Systems  Review of Systems   Constitutional: Negative.  Negative for fever.  HENT: Negative.    Respiratory:  Positive for cough, chest tightness and shortness of breath.   Cardiovascular:  Positive for leg swelling.   Physical Exam  BP 132/62 (BP Location: Left Arm, Patient Position: Sitting, Cuff Size: Large)   Pulse 94   Temp 97.9 F (36.6 C) (Oral)   Ht '5\' 11"'$  (1.803 m)   Wt 225 lb 3.2 oz (102.2 kg)   SpO2 97% Comment: 3 lpm continuous tank  BMI 31.41 kg/m  Physical Exam Constitutional:      Appearance: Normal appearance.  HENT:     Head: Normocephalic and atraumatic.     Mouth/Throat:     Mouth: Mucous membranes are moist.     Pharynx: Oropharynx is clear.  Cardiovascular:     Rate and Rhythm: Normal rate and regular rhythm.  Pulmonary:     Effort: Pulmonary  effort is normal.     Breath sounds: Wheezing present.  Musculoskeletal:        General: Swelling present. Normal range of motion.     Comments: +2-3 LLE edema, wrapped/unna boot   Skin:    General: Skin is warm and dry.  Neurological:     General: No focal deficit present.     Mental Status: He is alert and oriented to person, place, and time. Mental status is at baseline.  Psychiatric:        Mood and Affect: Mood normal.        Behavior: Behavior normal.        Thought Content: Thought content normal.        Judgment: Judgment normal.      Lab Results:  CBC No results found for: "WBC", "RBC", "HGB", "HCT", "PLT", "MCV", "MCH", "MCHC", "RDW", "LYMPHSABS", "MONOABS", "EOSABS", "BASOSABS"  BMET    Component Value Date/Time   NA 132 (L) 06/21/2021 1149   NA 133 (L) 08/28/2019 1056   K 3.7 06/21/2021 1149   CL 91 (L) 06/21/2021 1149   CO2 32 06/21/2021 1149   GLUCOSE 107 (H) 06/21/2021 1149   BUN 6 06/21/2021 1149   BUN 11 08/28/2019 1056   CREATININE 0.64 06/21/2021 1149   CALCIUM 9.5 06/21/2021 1149   GFRNONAA 91 08/28/2019 1056   GFRAA 105 08/28/2019 1056    BNP No results found for: "BNP"  ProBNP    Component  Value Date/Time   PROBNP 28.0 06/21/2021 1149    Imaging: DG Chest 2 View  Result Date: 02/05/2022 CLINICAL DATA:  Shortness of breath.  COPD exacerbation. EXAM: CHEST - 2 VIEW COMPARISON:  CT 06/22/2021.  Radiographs 06/24/2020 and 01/10/2012. FINDINGS: The heart size and mediastinal contours are stable. There is chronic lung disease with emphysema, diffuse central airway thickening and architectural distortion, likely progressive from prior radiographs. No definite superimposed airspace disease, edema, pleural effusion or pneumothorax. No acute osseous findings are seen. There are degenerative changes in the spine associated with a convex right thoracic scoliosis. IMPRESSION: Chronic lung disease with probable progressive central airway thickening and architectural distortion. No definite acute superimposed process. Electronically Signed   By: Richardean Sale M.D.   On: 02/05/2022 12:09     Assessment & Plan:   COPD with chronic bronchitis and emphysema ([place]) - Patient reports dyspnea and chest tightness last several days. Improved today. He had wheezing on exam. Sending in prednisone taper. Checking labs and CXR. Continue Breztri Aerosphere two puffs twice daily  (if not covered, we can change back to Trelegy)  Rx: - Prednisone taper '40mg'$  x 2 days; '30mg'$  x 2 days; '20mg'$  x 2 days; '10mg'$  x 2 days  Orders: - CXR today and labs (ordered) - Walk on 3L oxygen   Follow-up: - 3 months with Dr. [name] ([place] location)   Swelling of lower extremity - Continue Lasix 20 mg day (we may increase dose for a couple of days if kidney function is ok on labs to help with swelling)  - Elevate right leg and continue Unna boot wraps. Notify PCPA if you develop fever, open sores or redness   Chronic respiratory failure with hypoxia ([facility]) - Desaturated to 87% on 3L, recovered with rest to 90%.  - Continue 3-4L with exertion/bedtime   40 mins spent on case; > 50% face to face with patient   Martyn Ehrich, NP 02/05/2022

## 2022-02-05 NOTE — Assessment & Plan Note (Signed)
-  Desaturated to 87% on 3L, recovered with rest to 90%.  - Continue 3-4L with exertion/bedtime

## 2022-02-06 NOTE — Progress Notes (Signed)
Please let patient know BNP and CBC were normal. Kidney function fine. Sodium is low, this appears chronic. I dont want to change his diuretic right now, have him follow-up with PCP for leg swelling.

## 2022-02-08 ENCOUNTER — Ambulatory Visit (HOSPITAL_BASED_OUTPATIENT_CLINIC_OR_DEPARTMENT_OTHER): Payer: PPO | Admitting: General Surgery

## 2022-02-12 ENCOUNTER — Ambulatory Visit (HOSPITAL_BASED_OUTPATIENT_CLINIC_OR_DEPARTMENT_OTHER): Payer: PPO | Admitting: General Surgery

## 2022-03-03 DIAGNOSIS — J439 Emphysema, unspecified: Secondary | ICD-10-CM | POA: Diagnosis not present

## 2022-03-03 DIAGNOSIS — J42 Unspecified chronic bronchitis: Secondary | ICD-10-CM | POA: Diagnosis not present

## 2022-03-03 DIAGNOSIS — J9611 Chronic respiratory failure with hypoxia: Secondary | ICD-10-CM | POA: Diagnosis not present

## 2022-03-03 DIAGNOSIS — J449 Chronic obstructive pulmonary disease, unspecified: Secondary | ICD-10-CM | POA: Diagnosis not present

## 2022-03-06 ENCOUNTER — Ambulatory Visit: Payer: PPO | Attending: Cardiovascular Disease | Admitting: Cardiovascular Disease

## 2022-03-06 ENCOUNTER — Encounter: Payer: Self-pay | Admitting: Cardiovascular Disease

## 2022-03-06 VITALS — BP 126/60 | HR 88 | Ht 71.0 in | Wt 225.0 lb

## 2022-03-06 DIAGNOSIS — I10 Essential (primary) hypertension: Secondary | ICD-10-CM

## 2022-03-06 DIAGNOSIS — I2510 Atherosclerotic heart disease of native coronary artery without angina pectoris: Secondary | ICD-10-CM

## 2022-03-06 DIAGNOSIS — E782 Mixed hyperlipidemia: Secondary | ICD-10-CM

## 2022-03-06 DIAGNOSIS — M7989 Other specified soft tissue disorders: Secondary | ICD-10-CM | POA: Diagnosis not present

## 2022-03-06 DIAGNOSIS — Z87891 Personal history of nicotine dependence: Secondary | ICD-10-CM

## 2022-03-06 NOTE — Progress Notes (Signed)
03/06/2022 Woodard Danza Simmerman   01/07/1946  [id number]  Primary Physician Lujean Amel, MD Primary Cardiologist: Lorretta Harp MD Garret Reddish, [place], [place]  HPI:  [name] [name] is a 77 y.o.  weight married Caucasian male father of 2 children, with no grandchildren, who is retired from working at [employer].  He was referred by Dr.[name], his PCP, because of diffuse coronary calcification seen on recent screening chest CT performed 07/03/2019.  I last saw him in the office 10/18/2020.  His risk factors include 80-pack-year tobacco abuse having quit 12 years ago, treated hypertension and hyperlipidemia.  There is no family history of heart disease.  He is never had a heart attack or stroke.  He denies chest pain but does get short of breath but has COPD as well.  Recent chest CT done for surveillance 07/03/2019 revealed diffuse calcification in all 3 coronary arteries.   He had a 2D echo which was essentially normal with an EF of 65 to 70% and no valvular abnormalities.  He had a coronary CTA revealing coronary calcium score of 978 with a left dominant system and what appeared to be hemodynamically significant disease in the distal left circumflex probably unrelated to his symptoms.   Since I saw him a year and a half ago he remained stable.  He is now on oxygen 24/7 with some progression of his pulmonary disease.  He continues to deny chest pain.   Current Meds  Medication Sig   albuterol (VENTOLIN HFA) 108 (90 Base) MCG/ACT inhaler Inhale 2 puffs into the lungs every 6 (six) hours as needed for wheezing or shortness of breath.   ALPRAZolam (XANAX) 0.25 MG tablet Take 1 tablet (0.25 mg total) by mouth daily as needed.   amLODipine (NORVASC) 5 MG tablet Take 5 mg by mouth daily.   atorvastatin (LIPITOR) 40 MG tablet Take 40 mg by mouth daily. Pt takes two twice daily   Budeson-Glycopyrrol-Formoterol (BREZTRI AEROSPHERE) 160-9-4.8 MCG/ACT AERO Inhale 2 puffs into the  lungs in the morning and at bedtime.   Budeson-Glycopyrrol-Formoterol (BREZTRI AEROSPHERE) 160-9-4.8 MCG/ACT AERO Inhale 2 puffs into the lungs in the morning and at bedtime.   furosemide (LASIX) 40 MG tablet Take 40 mg by mouth daily.   ibuprofen (ADVIL,MOTRIN) 200 MG tablet Take 400 mg by mouth every 6 (six) hours as needed.   Multiple Vitamins-Minerals (MULTIVITAMIN WITH MINERALS) tablet Take 1 tablet by mouth daily.   Potassium Chloride ER 20 MEQ TBCR Take 1 tablet by mouth daily.   predniSONE (DELTASONE) 10 MG tablet 4 tabs for 2 days, then 3 tabs for 2 days, 2 tabs for 2 days, then 1 tab for 2 days, then stop   sertraline (ZOLOFT) 25 MG tablet Take 25 mg by mouth daily.   vitamin B-12 (CYANOCOBALAMIN) 1000 MCG tablet Take 1,000 mcg by mouth.     Allergies  Allergen Reactions   Fluticasone-Salmeterol     Other reaction(s): worsening shortness of breath    Social History   Socioeconomic History   Marital status: Married    Spouse name: Not on file   Number of children: 2   Years of education: 16   Highest education level: Not on file  Occupational History   Occupation: retired  Tobacco Use   Smoking status: Former    Packs/day: 2.00    Years: 40.00    Total pack years: 80.00    Types: Cigarettes    Quit date: 2010    Years  since quitting: 14.1    Passive exposure: Never   Smokeless tobacco: Never  Vaping Use   Vaping Use: Not on file  Substance and Sexual Activity   Alcohol use: Yes    Alcohol/week: 12.0 standard drinks of alcohol    Types: 12 Cans of beer per week   Drug use: No   Sexual activity: Not on file  Other Topics Concern   Not on file  Social History Narrative   Right handed   One story home   Social Determinants of Health   Financial Resource Strain: Not on file  Food Insecurity: Not on file  Transportation Needs: Not on file  Physical Activity: Not on file  Stress: Not on file  Social Connections: Not on file  Intimate Partner Violence: Not  on file     Review of Systems: General: negative for chills, fever, night sweats or weight changes.  Cardiovascular: negative for chest pain, dyspnea on exertion, edema, orthopnea, palpitations, paroxysmal nocturnal dyspnea or shortness of breath Dermatological: negative for rash Respiratory: negative for cough or wheezing Urologic: negative for hematuria Abdominal: negative for nausea, vomiting, diarrhea, bright red blood per rectum, melena, or hematemesis Neurologic: negative for visual changes, syncope, or dizziness All other systems reviewed and are otherwise negative except as noted above.    Blood pressure 126/60, pulse 88, height '5\' 11"'$  (1.803 m), weight 225 lb (102.1 kg), SpO2 95 %.  General appearance: alert and no distress Neck: no adenopathy, no carotid bruit, no JVD, supple, symmetrical, trachea midline, and thyroid not enlarged, symmetric, no tenderness/mass/nodules Lungs: Diminished breath sounds bilaterally Heart: regular rate and rhythm, S1, S2 normal, no murmur, click, rub or gallop Extremities: 2+ pitting edema bilaterally. Pulses: 2+ and symmetric Skin: Skin color, texture, turgor normal. No rashes or lesions Neurologic: Grossly normal  EKG sinus rhythm at 88 without ST or T wave changes.  Personally reviewed this EKG.  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  We elected to treat this medically.  Essential hypertension History of essential hypertension a blood pressure measured today at 126/60.  He is on amlodipine.  Hyperlipidemia History of hyperlipidemia on statin therapy with lipid profile performed 12/19/2021 revealing total cholesterol 130, LDL of 33 and HDL of 87.  Former  smoker Long history tobacco abuse having quit 15 years ago now with oxygen dependent COPD.  Swelling of lower extremity History of bilateral lower extremity edema on oral diuretics and compression stockings.  2D echo showed normal LV function without diastolic dysfunction.     Lorretta Harp MD FACP,FACC,FAHA, [facility] 03/06/2022 11:44 AM

## 2022-03-06 NOTE — Assessment & Plan Note (Signed)
History of bilateral lower extremity edema on oral diuretics and compression stockings.  2D echo showed normal LV function without diastolic dysfunction.

## 2022-03-06 NOTE — Patient Instructions (Signed)
Medication Instructions:  Your physician recommends that you continue on your current medications as directed. Please refer to the Current Medication list given to you today.  *If you need a refill on your cardiac medications before your next appointment, please call your pharmacy*   Follow-Up: At [facility], you and your health needs are our priority.  As part of our continuing mission to provide you with exceptional heart care, we have created designated Provider Care Teams.  These Care Teams include your primary Cardiologist (physician) and Advanced Practice Providers (APPs -  Physician Assistants and Nurse Practitioners) who all work together to provide you with the care you need, when you need it.  We recommend signing up for the patient portal called "MyChart".  Sign up information is provided on this After Visit Summary.  MyChart is used to connect with patients for Virtual Visits (Telemedicine).  Patients are able to view lab/test results, encounter notes, upcoming appointments, etc.  Non-urgent messages can be sent to your provider as well.   To learn more about what you can do with MyChart, go to [email or web address].    Your next appointment:   12 month(s)  Provider:   Quay Burow, MD

## 2022-03-06 NOTE — Assessment & Plan Note (Signed)
History of essential hypertension a blood pressure measured today at 126/60.  He is on amlodipine.

## 2022-03-06 NOTE — Assessment & Plan Note (Signed)
History of coronary calcification on chest CT with coronary calcium score measured 09/03/2019 of 978 and CTA showed left dominant system without distal left PDA lesion that appeared significant by FFR analysis although the patient is completely asymptomatic.  We elected to treat this medically.

## 2022-03-06 NOTE — Assessment & Plan Note (Signed)
History of hyperlipidemia on statin therapy with lipid profile performed 12/19/2021 revealing total cholesterol 130, LDL of 33 and HDL of 87.

## 2022-03-06 NOTE — Assessment & Plan Note (Signed)
Long history tobacco abuse having quit 15 years ago now with oxygen dependent COPD.

## 2022-03-12 DIAGNOSIS — D225 Melanocytic nevi of trunk: Secondary | ICD-10-CM | POA: Diagnosis not present

## 2022-03-12 DIAGNOSIS — L720 Epidermal cyst: Secondary | ICD-10-CM | POA: Diagnosis not present

## 2022-03-12 DIAGNOSIS — Z85828 Personal history of other malignant neoplasm of skin: Secondary | ICD-10-CM | POA: Diagnosis not present

## 2022-03-12 DIAGNOSIS — L820 Inflamed seborrheic keratosis: Secondary | ICD-10-CM | POA: Diagnosis not present

## 2022-03-12 DIAGNOSIS — L821 Other seborrheic keratosis: Secondary | ICD-10-CM | POA: Diagnosis not present

## 2022-03-12 DIAGNOSIS — Z86018 Personal history of other benign neoplasm: Secondary | ICD-10-CM | POA: Diagnosis not present

## 2022-03-12 DIAGNOSIS — L219 Seborrheic dermatitis, unspecified: Secondary | ICD-10-CM | POA: Diagnosis not present

## 2022-03-12 DIAGNOSIS — L578 Other skin changes due to chronic exposure to nonionizing radiation: Secondary | ICD-10-CM | POA: Diagnosis not present

## 2022-03-12 DIAGNOSIS — L814 Other melanin hyperpigmentation: Secondary | ICD-10-CM | POA: Diagnosis not present

## 2022-03-12 DIAGNOSIS — L570 Actinic keratosis: Secondary | ICD-10-CM | POA: Diagnosis not present

## 2022-03-21 DIAGNOSIS — J9611 Chronic respiratory failure with hypoxia: Secondary | ICD-10-CM | POA: Diagnosis not present

## 2022-03-21 DIAGNOSIS — Z9981 Dependence on supplemental oxygen: Secondary | ICD-10-CM | POA: Diagnosis not present

## 2022-03-21 DIAGNOSIS — F1020 Alcohol dependence, uncomplicated: Secondary | ICD-10-CM | POA: Diagnosis not present

## 2022-03-21 DIAGNOSIS — L97921 Non-pressure chronic ulcer of unspecified part of left lower leg limited to breakdown of skin: Secondary | ICD-10-CM | POA: Diagnosis not present

## 2022-03-21 DIAGNOSIS — I700 Atherosclerosis of aorta: Secondary | ICD-10-CM | POA: Diagnosis not present

## 2022-03-21 DIAGNOSIS — I2510 Atherosclerotic heart disease of native coronary artery without angina pectoris: Secondary | ICD-10-CM | POA: Diagnosis not present

## 2022-03-21 DIAGNOSIS — R600 Localized edema: Secondary | ICD-10-CM | POA: Diagnosis not present

## 2022-03-21 DIAGNOSIS — J432 Centrilobular emphysema: Secondary | ICD-10-CM | POA: Diagnosis not present

## 2022-03-21 DIAGNOSIS — J449 Chronic obstructive pulmonary disease, unspecified: Secondary | ICD-10-CM | POA: Diagnosis not present

## 2022-04-01 DIAGNOSIS — J449 Chronic obstructive pulmonary disease, unspecified: Secondary | ICD-10-CM | POA: Diagnosis not present

## 2022-04-01 DIAGNOSIS — J42 Unspecified chronic bronchitis: Secondary | ICD-10-CM | POA: Diagnosis not present

## 2022-04-01 DIAGNOSIS — J9611 Chronic respiratory failure with hypoxia: Secondary | ICD-10-CM | POA: Diagnosis not present

## 2022-04-01 DIAGNOSIS — J439 Emphysema, unspecified: Secondary | ICD-10-CM | POA: Diagnosis not present

## 2022-05-02 DIAGNOSIS — J42 Unspecified chronic bronchitis: Secondary | ICD-10-CM | POA: Diagnosis not present

## 2022-05-02 DIAGNOSIS — J449 Chronic obstructive pulmonary disease, unspecified: Secondary | ICD-10-CM | POA: Diagnosis not present

## 2022-05-02 DIAGNOSIS — J9611 Chronic respiratory failure with hypoxia: Secondary | ICD-10-CM | POA: Diagnosis not present

## 2022-05-02 DIAGNOSIS — J439 Emphysema, unspecified: Secondary | ICD-10-CM | POA: Diagnosis not present

## 2022-05-07 ENCOUNTER — Ambulatory Visit (INDEPENDENT_AMBULATORY_CARE_PROVIDER_SITE_OTHER): Payer: PPO | Admitting: Pulmonary Disease

## 2022-05-07 ENCOUNTER — Encounter (HOSPITAL_BASED_OUTPATIENT_CLINIC_OR_DEPARTMENT_OTHER): Payer: Self-pay | Admitting: Pulmonary Disease

## 2022-05-07 VITALS — BP 114/60 | HR 89 | Temp 98.2°F | Ht 71.0 in | Wt 226.0 lb

## 2022-05-07 DIAGNOSIS — J439 Emphysema, unspecified: Secondary | ICD-10-CM

## 2022-05-07 DIAGNOSIS — G621 Alcoholic polyneuropathy: Secondary | ICD-10-CM

## 2022-05-07 DIAGNOSIS — J4489 Other specified chronic obstructive pulmonary disease: Secondary | ICD-10-CM | POA: Diagnosis not present

## 2022-05-07 DIAGNOSIS — J9611 Chronic respiratory failure with hypoxia: Secondary | ICD-10-CM

## 2022-05-07 MED ORDER — PREDNISONE 10 MG PO TABS: ORAL_TABLET | ORAL | 0 refills | Status: AC

## 2022-05-07 NOTE — Assessment & Plan Note (Addendum)
Continue 3 L oxygen at all times He would benefit from pulmonary rehab but he is not enthusiastic about starting this

## 2022-05-07 NOTE — Patient Instructions (Signed)
X Prednisone 10 mg tabs  Take 2 tabs daily with food x 5ds, then 1 tab daily with food x 5ds then STOP

## 2022-05-07 NOTE — Assessment & Plan Note (Signed)
We discussed prognosis. He will continue on Breztri and use albuterol as needed. We discussed COPD action plan and signs and symptoms of COPD exacerbation He has mild bronchospasm today which likely related to pollen.  Will give him low-dose prednisone for this

## 2022-05-07 NOTE — Progress Notes (Signed)
   Subjective:    Patient ID: [name] [name], male    DOB: 20-Nov-1945, 77 y.o.   MRN: [id number]  HPI  77 yo heavy ex-smoker  For FU of  COPD. He smoked about a pack per day starting as a teenager until he quit in 2010- about 40 pack years. -on portable oxygen 03/2019 -uses 2 L continuous at home and  POC 3 to 4 L when he goes out -completed pulmonary rehab 2020  59-month follow-up visit.  Last seen by BW in 01/2022.  His leg ulcers are healed.  He admits to being very sedentary.  He is using 2 L oxygen all the time His breathing is worse and he attributes this to pollen season.  We gave him low-dose prednisone in January and the seem to help He would like to know his prognosis for his breathing.   Significant tests/ events reviewed   07/2020 ONO /RA -desaturation less than 88% for 23 minutes   CTA 06/2021 >> Upper lobe predominant emphysema and Scarring.Multiple small calcified and noncalcified pulmonary nodules are unchanged     LDCT 07/05/20 showed lung RADS 2. Centrilobular and paraseptal emphysema , unchanged nodules compared to 2020   PFTs 09/2012 moderate reversible airway obstruction, ratio 49, FEV1 44%, improved to 56%/25% bronchodilator response, TLC 88%, DLCO 55%   Spirometry 03/18/2018 showed ratio of 61, FEV1 of 43% and FVC of 52%  Review of Systems neg for any significant sore throat, dysphagia, itching, sneezing, nasal congestion or excess/ purulent secretions, fever, chills, sweats, unintended wt loss, pleuritic or exertional cp, hempoptysis, orthopnea pnd or change in chronic leg swelling. Also denies presyncope, palpitations, heartburn, abdominal pain, nausea, vomiting, diarrhea or change in bowel or urinary habits, dysuria,hematuria, rash, arthralgias, visual complaints, headache, numbness weakness or ataxia.     Objective:   Physical Exam  Gen. 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

## 2022-06-01 DIAGNOSIS — J439 Emphysema, unspecified: Secondary | ICD-10-CM | POA: Diagnosis not present

## 2022-06-01 DIAGNOSIS — J9611 Chronic respiratory failure with hypoxia: Secondary | ICD-10-CM | POA: Diagnosis not present

## 2022-06-01 DIAGNOSIS — J449 Chronic obstructive pulmonary disease, unspecified: Secondary | ICD-10-CM | POA: Diagnosis not present

## 2022-06-01 DIAGNOSIS — J42 Unspecified chronic bronchitis: Secondary | ICD-10-CM | POA: Diagnosis not present

## 2022-06-06 DIAGNOSIS — I2510 Atherosclerotic heart disease of native coronary artery without angina pectoris: Secondary | ICD-10-CM | POA: Diagnosis not present

## 2022-06-06 DIAGNOSIS — J449 Chronic obstructive pulmonary disease, unspecified: Secondary | ICD-10-CM | POA: Diagnosis not present

## 2022-06-06 DIAGNOSIS — J432 Centrilobular emphysema: Secondary | ICD-10-CM | POA: Diagnosis not present

## 2022-06-06 DIAGNOSIS — E7800 Pure hypercholesterolemia, unspecified: Secondary | ICD-10-CM | POA: Diagnosis not present

## 2022-06-06 DIAGNOSIS — I10 Essential (primary) hypertension: Secondary | ICD-10-CM | POA: Diagnosis not present

## 2022-06-21 DIAGNOSIS — I700 Atherosclerosis of aorta: Secondary | ICD-10-CM | POA: Diagnosis not present

## 2022-06-21 DIAGNOSIS — Z79899 Other long term (current) drug therapy: Secondary | ICD-10-CM | POA: Diagnosis not present

## 2022-06-21 DIAGNOSIS — Z9981 Dependence on supplemental oxygen: Secondary | ICD-10-CM | POA: Diagnosis not present

## 2022-06-21 DIAGNOSIS — J439 Emphysema, unspecified: Secondary | ICD-10-CM | POA: Diagnosis not present

## 2022-06-21 DIAGNOSIS — I10 Essential (primary) hypertension: Secondary | ICD-10-CM | POA: Diagnosis not present

## 2022-06-21 DIAGNOSIS — F1020 Alcohol dependence, uncomplicated: Secondary | ICD-10-CM | POA: Diagnosis not present

## 2022-06-21 DIAGNOSIS — J9611 Chronic respiratory failure with hypoxia: Secondary | ICD-10-CM | POA: Diagnosis not present

## 2022-06-25 ENCOUNTER — Ambulatory Visit (HOSPITAL_BASED_OUTPATIENT_CLINIC_OR_DEPARTMENT_OTHER)
Admission: RE | Admit: 2022-06-25 | Discharge: 2022-06-25 | Disposition: A | Discharge status: Home / Self Care | Payer: PPO | Source: Ambulatory Visit | Attending: Acute Care | Admitting: Acute Care

## 2022-06-25 DIAGNOSIS — Z122 Encounter for screening for malignant neoplasm of respiratory organs: Secondary | ICD-10-CM | POA: Insufficient documentation

## 2022-06-25 DIAGNOSIS — Z87891 Personal history of nicotine dependence: Secondary | ICD-10-CM | POA: Insufficient documentation

## 2022-07-02 ENCOUNTER — Telehealth: Payer: Self-pay | Admitting: Acute Care

## 2022-07-02 DIAGNOSIS — J9611 Chronic respiratory failure with hypoxia: Secondary | ICD-10-CM | POA: Diagnosis not present

## 2022-07-02 DIAGNOSIS — J439 Emphysema, unspecified: Secondary | ICD-10-CM | POA: Diagnosis not present

## 2022-07-02 DIAGNOSIS — J449 Chronic obstructive pulmonary disease, unspecified: Secondary | ICD-10-CM | POA: Diagnosis not present

## 2022-07-02 NOTE — Telephone Encounter (Signed)
Call Report  

## 2022-07-02 NOTE — Telephone Encounter (Signed)
Nothing further needed 

## 2022-07-03 ENCOUNTER — Telehealth: Payer: Self-pay | Admitting: Pulmonary Disease

## 2022-07-03 NOTE — Telephone Encounter (Signed)
Called and spoke with patient. He stated that his O2 levels have been dropping as low as 70% on 3L with exertion over the past 24-48 hours. When he is resting, his O2 levels are in the 90s. He has noticed an increase in SOB as well. Denied any coughing.   I advised him that since his O2 levels are dropping that low, it would be best for him to present to the ED. He prefers the [facility] location. Also advised him to not drive himself to get a friend/family member or call 911. He verbalized understanding.   Nothing further needed at time of call.

## 2022-07-03 NOTE — Telephone Encounter (Signed)
Patient calling in again asking for an update

## 2022-07-03 NOTE — Telephone Encounter (Signed)
PT calling saying Dr. Vassie Loll gave him some Pred recently and he seems to have gotten worse. Wonders if he needs another round or  to present to ER. Please call to advise @ [phone]  Pharm: [facility] @ [address]

## 2022-07-04 ENCOUNTER — Inpatient Hospital Stay (HOSPITAL_BASED_OUTPATIENT_CLINIC_OR_DEPARTMENT_OTHER)
Admission: EM | Admit: 2022-07-04 | Discharge: 2022-07-09 | Disposition: A | Discharge status: Home / Self Care | Payer: PPO | Attending: Internal Medicine | Admitting: Internal Medicine

## 2022-07-04 ENCOUNTER — Observation Stay (HOSPITAL_COMMUNITY): Payer: PPO

## 2022-07-04 ENCOUNTER — Emergency Department (HOSPITAL_BASED_OUTPATIENT_CLINIC_OR_DEPARTMENT_OTHER): Payer: PPO

## 2022-07-04 ENCOUNTER — Other Ambulatory Visit (HOSPITAL_BASED_OUTPATIENT_CLINIC_OR_DEPARTMENT_OTHER): Payer: Self-pay

## 2022-07-04 ENCOUNTER — Other Ambulatory Visit: Payer: Self-pay

## 2022-07-04 ENCOUNTER — Encounter (HOSPITAL_BASED_OUTPATIENT_CLINIC_OR_DEPARTMENT_OTHER): Payer: Self-pay

## 2022-07-04 DIAGNOSIS — F410 Panic disorder [episodic paroxysmal anxiety] without agoraphobia: Secondary | ICD-10-CM | POA: Diagnosis present

## 2022-07-04 DIAGNOSIS — Z9981 Dependence on supplemental oxygen: Secondary | ICD-10-CM

## 2022-07-04 DIAGNOSIS — J432 Centrilobular emphysema: Secondary | ICD-10-CM | POA: Diagnosis not present

## 2022-07-04 DIAGNOSIS — G621 Alcoholic polyneuropathy: Secondary | ICD-10-CM | POA: Diagnosis present

## 2022-07-04 DIAGNOSIS — J9621 Acute and chronic respiratory failure with hypoxia: Secondary | ICD-10-CM | POA: Diagnosis present

## 2022-07-04 DIAGNOSIS — I872 Venous insufficiency (chronic) (peripheral): Secondary | ICD-10-CM | POA: Diagnosis not present

## 2022-07-04 DIAGNOSIS — E669 Obesity, unspecified: Secondary | ICD-10-CM | POA: Diagnosis not present

## 2022-07-04 DIAGNOSIS — I119 Hypertensive heart disease without heart failure: Secondary | ICD-10-CM | POA: Diagnosis not present

## 2022-07-04 DIAGNOSIS — Z87891 Personal history of nicotine dependence: Secondary | ICD-10-CM

## 2022-07-04 DIAGNOSIS — Z7951 Long term (current) use of inhaled steroids: Secondary | ICD-10-CM | POA: Diagnosis not present

## 2022-07-04 DIAGNOSIS — Z1152 Encounter for screening for COVID-19: Secondary | ICD-10-CM | POA: Diagnosis not present

## 2022-07-04 DIAGNOSIS — K219 Gastro-esophageal reflux disease without esophagitis: Secondary | ICD-10-CM | POA: Diagnosis present

## 2022-07-04 DIAGNOSIS — R0602 Shortness of breath: Secondary | ICD-10-CM

## 2022-07-04 DIAGNOSIS — J449 Chronic obstructive pulmonary disease, unspecified: Secondary | ICD-10-CM | POA: Diagnosis present

## 2022-07-04 DIAGNOSIS — R059 Cough, unspecified: Secondary | ICD-10-CM | POA: Diagnosis not present

## 2022-07-04 DIAGNOSIS — Z6831 Body mass index (BMI) 31.0-31.9, adult: Secondary | ICD-10-CM

## 2022-07-04 DIAGNOSIS — D7589 Other specified diseases of blood and blood-forming organs: Secondary | ICD-10-CM | POA: Diagnosis present

## 2022-07-04 DIAGNOSIS — J439 Emphysema, unspecified: Secondary | ICD-10-CM | POA: Diagnosis present

## 2022-07-04 DIAGNOSIS — F1010 Alcohol abuse, uncomplicated: Secondary | ICD-10-CM | POA: Diagnosis present

## 2022-07-04 DIAGNOSIS — F411 Generalized anxiety disorder: Secondary | ICD-10-CM | POA: Diagnosis present

## 2022-07-04 DIAGNOSIS — Z800 Family history of malignant neoplasm of digestive organs: Secondary | ICD-10-CM

## 2022-07-04 DIAGNOSIS — Z79899 Other long term (current) drug therapy: Secondary | ICD-10-CM | POA: Diagnosis not present

## 2022-07-04 DIAGNOSIS — I10 Essential (primary) hypertension: Secondary | ICD-10-CM | POA: Diagnosis present

## 2022-07-04 DIAGNOSIS — R058 Other specified cough: Principal | ICD-10-CM

## 2022-07-04 DIAGNOSIS — J984 Other disorders of lung: Secondary | ICD-10-CM | POA: Diagnosis not present

## 2022-07-04 DIAGNOSIS — I700 Atherosclerosis of aorta: Secondary | ICD-10-CM | POA: Diagnosis not present

## 2022-07-04 DIAGNOSIS — E785 Hyperlipidemia, unspecified: Secondary | ICD-10-CM | POA: Diagnosis not present

## 2022-07-04 DIAGNOSIS — Z888 Allergy status to other drugs, medicaments and biological substances status: Secondary | ICD-10-CM

## 2022-07-04 DIAGNOSIS — I2510 Atherosclerotic heart disease of native coronary artery without angina pectoris: Secondary | ICD-10-CM | POA: Diagnosis not present

## 2022-07-04 DIAGNOSIS — J441 Chronic obstructive pulmonary disease with (acute) exacerbation: Principal | ICD-10-CM | POA: Diagnosis present

## 2022-07-04 DIAGNOSIS — K5900 Constipation, unspecified: Secondary | ICD-10-CM | POA: Diagnosis present

## 2022-07-04 DIAGNOSIS — R0902 Hypoxemia: Secondary | ICD-10-CM

## 2022-07-04 DIAGNOSIS — R918 Other nonspecific abnormal finding of lung field: Secondary | ICD-10-CM | POA: Diagnosis not present

## 2022-07-04 DIAGNOSIS — F32A Depression, unspecified: Secondary | ICD-10-CM | POA: Diagnosis present

## 2022-07-04 DIAGNOSIS — R062 Wheezing: Secondary | ICD-10-CM | POA: Diagnosis not present

## 2022-07-04 LAB — CBC WITH DIFFERENTIAL/PLATELET
Abs Immature Granulocytes: 0.05 10*3/uL (ref 0.00–0.07)
Basophils Absolute: 0 10*3/uL (ref 0.0–0.1)
Basophils Relative: 0 %
Eosinophils Absolute: 0 10*3/uL (ref 0.0–0.5)
Eosinophils Relative: 1 %
HCT: 42.7 % (ref 39.0–52.0)
Hemoglobin: 14.8 g/dL (ref 13.0–17.0)
Immature Granulocytes: 1 %
Lymphocytes Relative: 16 %
Lymphs Abs: 1.4 10*3/uL (ref 0.7–4.0)
MCH: 36.1 pg — ABNORMAL HIGH (ref 26.0–34.0)
MCHC: 34.7 g/dL (ref 30.0–36.0)
MCV: 104.1 fL — ABNORMAL HIGH (ref 80.0–100.0)
Monocytes Absolute: 0.8 10*3/uL (ref 0.1–1.0)
Monocytes Relative: 9 %
Neutro Abs: 6.4 10*3/uL (ref 1.7–7.7)
Neutrophils Relative %: 73 %
Platelets: 166 10*3/uL (ref 150–400)
RBC: 4.1 MIL/uL — ABNORMAL LOW (ref 4.22–5.81)
RDW: 13.4 % (ref 11.5–15.5)
WBC: 8.7 10*3/uL (ref 4.0–10.5)
nRBC: 0 % (ref 0.0–0.2)

## 2022-07-04 LAB — COMPREHENSIVE METABOLIC PANEL
ALT: 18 U/L (ref 0–44)
AST: 29 U/L (ref 15–41)
Albumin: 3.9 g/dL (ref 3.5–5.0)
Alkaline Phosphatase: 124 U/L (ref 38–126)
Anion gap: 8 (ref 5–15)
BUN: 9 mg/dL (ref 8–23)
CO2: 34 mmol/L — ABNORMAL HIGH (ref 22–32)
Calcium: 9.5 mg/dL (ref 8.9–10.3)
Chloride: 92 mmol/L — ABNORMAL LOW (ref 98–111)
Creatinine, Ser: 0.66 mg/dL (ref 0.61–1.24)
GFR, Estimated: >60 mL/min (ref >60)
Glucose, Bld: 98 mg/dL (ref 70–99)
Potassium: 4.3 mmol/L (ref 3.5–5.1)
Sodium: 134 mmol/L — ABNORMAL LOW (ref 135–145)
Total Bilirubin: 0.9 mg/dL (ref 0.3–1.2)
Total Protein: 7.3 g/dL (ref 6.5–8.1)

## 2022-07-04 LAB — RESPIRATORY PANEL BY PCR

## 2022-07-04 LAB — BLOOD GAS, VENOUS
Acid-Base Excess: 6.9 mmol/L — ABNORMAL HIGH (ref 0.0–2.0)
Bicarbonate: 32.5 mmol/L — ABNORMAL HIGH (ref 20.0–28.0)
Drawn by: 6807
O2 Saturation: 70.6 %
Patient temperature: 37
pCO2, Ven: 49 mmHg (ref 44–60)
pH, Ven: 7.43 (ref 7.25–7.43)
pO2, Ven: 38 mmHg (ref 32–45)

## 2022-07-04 LAB — VITAMIN B12: Vitamin B-12: 2270 pg/mL — ABNORMAL HIGH (ref 180–914)

## 2022-07-04 LAB — TROPONIN I (HIGH SENSITIVITY)
Troponin I (High Sensitivity): 6 ng/L (ref <18)
Troponin I (High Sensitivity): 6 ng/L (ref <18)

## 2022-07-04 LAB — BRAIN NATRIURETIC PEPTIDE: B Natriuretic Peptide: 38.5 pg/mL (ref 0.0–100.0)

## 2022-07-04 LAB — LACTIC ACID, PLASMA
Lactic Acid, Venous: 1.4 mmol/L (ref 0.5–1.9)
Lactic Acid, Venous: 1.6 mmol/L (ref 0.5–1.9)

## 2022-07-04 LAB — RESP PANEL BY RT-PCR (RSV, FLU A&B, COVID)  RVPGX2
Influenza A by PCR: NEGATIVE
Influenza B by PCR: NEGATIVE
Resp Syncytial Virus by PCR: NEGATIVE
SARS Coronavirus 2 by RT PCR: NEGATIVE

## 2022-07-04 LAB — PROCALCITONIN: Procalcitonin: <0.1 ng/mL

## 2022-07-04 LAB — FOLATE: Folate: 12.8 ng/mL (ref >5.9)

## 2022-07-04 MED ORDER — ALBUTEROL SULFATE (2.5 MG/3ML) 0.083% IN NEBU: 5.0000 mg | INHALATION_SOLUTION | Freq: Once | RESPIRATORY_TRACT | Status: AC

## 2022-07-04 MED ORDER — VITAMIN B-12 1000 MCG PO TABS
1000.0000 ug | ORAL_TABLET | Freq: Every day | ORAL | Status: DC
  Administered 2022-07-04 – 2022-07-09 (×6): 1000 ug via ORAL
  Filled 2022-07-04 (×6): qty 1

## 2022-07-04 MED ORDER — IPRATROPIUM-ALBUTEROL 0.5-2.5 (3) MG/3ML IN SOLN
3.0000 mL | RESPIRATORY_TRACT | Status: DC | PRN
  Administered 2022-07-05 – 2022-07-07 (×2): 3 mL via RESPIRATORY_TRACT
  Filled 2022-07-04: qty 3

## 2022-07-04 MED ORDER — PREDNISONE 20 MG PO TABS: 20.0000 mg | ORAL_TABLET | Freq: Every day | ORAL | Status: DC

## 2022-07-04 MED ORDER — SERTRALINE HCL 100 MG PO TABS
100.0000 mg | ORAL_TABLET | Freq: Every day | ORAL | Status: DC
  Administered 2022-07-05 – 2022-07-09 (×5): 100 mg via ORAL
  Filled 2022-07-04 (×5): qty 1

## 2022-07-04 MED ORDER — ALBUTEROL SULFATE (2.5 MG/3ML) 0.083% IN NEBU
INHALATION_SOLUTION | RESPIRATORY_TRACT | Status: AC
  Administered 2022-07-04: 2.5 mg
  Filled 2022-07-04: qty 3

## 2022-07-04 MED ORDER — ADULT MULTIVITAMIN W/MINERALS CH
1.0000 | ORAL_TABLET | Freq: Every day | ORAL | Status: DC
  Administered 2022-07-05 – 2022-07-09 (×5): 1 via ORAL
  Filled 2022-07-04 (×5): qty 1

## 2022-07-04 MED ORDER — IPRATROPIUM-ALBUTEROL 0.5-2.5 (3) MG/3ML IN SOLN
RESPIRATORY_TRACT | Status: AC
  Filled 2022-07-04: qty 3

## 2022-07-04 MED ORDER — IPRATROPIUM-ALBUTEROL 0.5-2.5 (3) MG/3ML IN SOLN
3.0000 mL | Freq: Three times a day (TID) | RESPIRATORY_TRACT | Status: DC
  Administered 2022-07-04: 3 mL via RESPIRATORY_TRACT
  Filled 2022-07-04: qty 3

## 2022-07-04 MED ORDER — REVEFENACIN 175 MCG/3ML IN SOLN
175.0000 ug | Freq: Every day | RESPIRATORY_TRACT | Status: DC
  Administered 2022-07-04 – 2022-07-09 (×6): 175 ug via RESPIRATORY_TRACT
  Filled 2022-07-04 (×6): qty 3

## 2022-07-04 MED ORDER — ARFORMOTEROL TARTRATE 15 MCG/2ML IN NEBU
15.0000 ug | INHALATION_SOLUTION | Freq: Two times a day (BID) | RESPIRATORY_TRACT | Status: DC
  Administered 2022-07-04 – 2022-07-09 (×10): 15 ug via RESPIRATORY_TRACT
  Filled 2022-07-04 (×10): qty 2

## 2022-07-04 MED ORDER — ALPRAZOLAM 0.25 MG PO TABS
0.2500 mg | ORAL_TABLET | Freq: Once | ORAL | Status: AC
  Administered 2022-07-04: 0.25 mg via ORAL
  Filled 2022-07-04: qty 1

## 2022-07-04 MED ORDER — ALBUTEROL SULFATE (2.5 MG/3ML) 0.083% IN NEBU
5.0000 mg | INHALATION_SOLUTION | Freq: Once | RESPIRATORY_TRACT | Status: AC
  Administered 2022-07-04: 5 mg via RESPIRATORY_TRACT
  Filled 2022-07-04: qty 6

## 2022-07-04 MED ORDER — PREDNISONE 20 MG PO TABS
30.0000 mg | ORAL_TABLET | Freq: Every day | ORAL | Status: DC
  Administered 2022-07-05 – 2022-07-06 (×2): 30 mg via ORAL
  Filled 2022-07-04 (×2): qty 1

## 2022-07-04 MED ORDER — POTASSIUM CHLORIDE CRYS ER 20 MEQ PO TBCR
20.0000 meq | EXTENDED_RELEASE_TABLET | Freq: Every day | ORAL | Status: DC
  Administered 2022-07-04 – 2022-07-09 (×6): 20 meq via ORAL
  Filled 2022-07-04 (×4): qty 1
  Filled 2022-07-04: qty 2
  Filled 2022-07-04 (×2): qty 1

## 2022-07-04 MED ORDER — FUROSEMIDE 40 MG PO TABS
40.0000 mg | ORAL_TABLET | Freq: Every day | ORAL | Status: DC
  Administered 2022-07-05 – 2022-07-09 (×5): 40 mg via ORAL
  Filled 2022-07-04 (×5): qty 1

## 2022-07-04 MED ORDER — SODIUM CHLORIDE 0.9 % IV SOLN: 500.0000 mg | INTRAVENOUS | Status: DC

## 2022-07-04 MED ORDER — IPRATROPIUM-ALBUTEROL 0.5-2.5 (3) MG/3ML IN SOLN: 3.0000 mL | Freq: Once | RESPIRATORY_TRACT | Status: DC

## 2022-07-04 MED ORDER — ALPRAZOLAM 0.25 MG PO TABS
0.2500 mg | ORAL_TABLET | Freq: Three times a day (TID) | ORAL | Status: DC | PRN
  Administered 2022-07-04 – 2022-07-07 (×4): 0.25 mg via ORAL
  Filled 2022-07-04 (×4): qty 1

## 2022-07-04 MED ORDER — PREDNISONE 10 MG PO TABS: 10.0000 mg | ORAL_TABLET | Freq: Every day | ORAL | Status: DC

## 2022-07-04 MED ORDER — ATORVASTATIN CALCIUM 20 MG PO TABS
20.0000 mg | ORAL_TABLET | Freq: Every day | ORAL | Status: DC
  Administered 2022-07-05 – 2022-07-09 (×5): 20 mg via ORAL
  Filled 2022-07-04 (×5): qty 1

## 2022-07-04 MED ORDER — ALBUTEROL SULFATE (2.5 MG/3ML) 0.083% IN NEBU
2.5000 mg | INHALATION_SOLUTION | Freq: Once | RESPIRATORY_TRACT | Status: AC
  Administered 2022-07-04: 2.5 mg via RESPIRATORY_TRACT

## 2022-07-04 MED ORDER — METHYLPREDNISOLONE SODIUM SUCC 125 MG IJ SOLR
125.0000 mg | Freq: Once | INTRAMUSCULAR | Status: AC
  Administered 2022-07-04: 125 mg via INTRAVENOUS
  Filled 2022-07-04: qty 2

## 2022-07-04 MED ORDER — SODIUM CHLORIDE 0.9 % IV SOLN: 2.0000 g | INTRAVENOUS | Status: DC

## 2022-07-04 MED ORDER — SODIUM CHLORIDE 0.9 % IV SOLN
1.0000 g | Freq: Once | INTRAVENOUS | Status: AC
  Administered 2022-07-04: 1 g via INTRAVENOUS
  Filled 2022-07-04: qty 10

## 2022-07-04 MED ORDER — ENOXAPARIN SODIUM 40 MG/0.4ML IJ SOSY
40.0000 mg | PREFILLED_SYRINGE | INTRAMUSCULAR | Status: DC
  Administered 2022-07-04 – 2022-07-09 (×6): 40 mg via SUBCUTANEOUS
  Filled 2022-07-04 (×6): qty 0.4

## 2022-07-04 MED ORDER — BUDESONIDE 0.5 MG/2ML IN SUSP
0.5000 mg | Freq: Two times a day (BID) | RESPIRATORY_TRACT | Status: DC
  Administered 2022-07-04 – 2022-07-09 (×10): 0.5 mg via RESPIRATORY_TRACT
  Filled 2022-07-04 (×10): qty 2

## 2022-07-04 MED ORDER — SODIUM CHLORIDE 0.9 % IV SOLN
500.0000 mg | Freq: Once | INTRAVENOUS | Status: AC
  Administered 2022-07-04: 500 mg via INTRAVENOUS
  Filled 2022-07-04: qty 5

## 2022-07-04 NOTE — Progress Notes (Signed)
Plan of Care Note for accepted transfer   Patient: [name] [name] MRN: [id number]   DOA: 07/04/2022  Facility requesting transfer: [facility]. Requesting Provider: Lynden Oxford, MD. Reason for transfer: Acute on chronic respiratory failure with hypoxia. Facility course:  Per Dr. Francene Castle:  [name] [name] is a 77 y.o. male with a past medical history significant for hypertension, hyperlipidemia, COPD on 3 L home oxygen, and previous bronchitis who presents with worsening cough and shortness of breath hypoxia.  According to patient, over the last several months he has had several COPD exacerbations requiring steroids and even had bronchitis earlier this year.  He said that over the last 2 weeks he has had worsening shortness of breath especially with exertion and he is having oxygen saturation drop into the 70s over the last few days with any exertion.  He reports he is having difficulty getting to the kitchen or to the bathroom to brush his teeth without getting winded.  This is not normal for him.  He reports has had more cough and is coughing up white sputum now.  He denies any focal chest pain or palpitations.  He denies documented fevers but has had some chills.  He denies any nausea, vomiting, constipation, diarrhea, or urinary changes.  He reports the edema in his legs is slightly improved from prior.  He had a CT scan recently that showed possible atypical infection about 9 days ago.  He was told to come in due to the hypoxia.   On exam, patient does have some wheezing and some rhonchi.  Minimal rales.  Chest and abdomen nontender.  Back nontender.  Oxygen saturations in the 90s on his home 3 L at rest but it reportedly dropped into the 70s with any exertion.   EKG does not show STEMI.   Will start workup to rule out concerning etiologies however I am concerned about possible pneumonia development versus COPD exacerbation.  Given his oxygen saturations going into the 70s, anticipate he  will likely need admission.   1:26 PM Patient's oxygen saturations went to 90% on his home 3 L.  He was having more shortness of breath with coughing.  Will increase him to 5 L and improved to mid 90s.  X-ray returned showing COPD with possible acute on chronic infection.   Will discuss with medicine in regards to antibiotic decision but I do feel he needs admission with the oxygen saturation going to the 70s and breathing worsening.   Spoke to medicine who requested antibiotics and steroids.  They will admit patient for further management of intermittent hypoxia with suspected pneumonia.  Lab work: Troponin I (High Sensitivity) [[id number]]   Collected: 07/04/22 1409   Updated: 07/04/22 1458    Troponin I (High Sensitivity) 6 ng/L  Lactic acid, plasma [[id number]]   Collected: 07/04/22 1407   Updated: 07/04/22 1455   Specimen Type: Blood    Lactic Acid, Venous 1.6 mmol/L  Procalcitonin [[id number]]   Collected: 07/04/22 1357   Updated: 07/04/22 1427   Respiratory (~20 pathogens) panel by PCR [[id number]]   Collected: 07/04/22 1223   Updated: 07/04/22 1404   Specimen Type: Respiratory   Specimen Source: Nasopharyngeal Swab   Resp panel by RT-PCR (RSV, Flu A&B, Covid) Anterior Nasal Swab [[id number]]   Collected: 07/04/22 1223   Updated: 07/04/22 1315   Specimen Source: Anterior Nasal Swab    SARS Coronavirus 2 by RT PCR NEGATIVE   Influenza A by PCR NEGATIVE   Influenza B by PCR NEGATIVE  Resp Syncytial Virus by PCR NEGATIVE  Troponin I (High Sensitivity) [[id number]]   Collected: 07/04/22 1154   Updated: 07/04/22 1306    Troponin I (High Sensitivity) 6 ng/L  Comprehensive metabolic panel [[id number]] (Abnormal)   Collected: 07/04/22 1154   Updated: 07/04/22 1304   Specimen Type: Blood   Specimen Source: Vein    Sodium 134 Low  mmol/L   Potassium 4.3 mmol/L   Chloride 92 Low  mmol/L   CO2 34 High  mmol/L   Glucose, Bld 98 mg/dL   BUN 9 mg/dL   Creatinine, Ser 1.32 mg/dL    Calcium 9.5 mg/dL   Total Protein 7.3 g/dL   Albumin 3.9 g/dL   AST 29 U/L   ALT 18 U/L   Alkaline Phosphatase 124 U/L   Total Bilirubin 0.9 mg/dL   GFR, Estimated >44 mL/min   Anion gap 8  Brain natriuretic peptide [[id number]]   Collected: 07/04/22 1154   Updated: 07/04/22 1304   Specimen Type: Blood   Specimen Source: Vein    B Natriuretic Peptide 38.5 pg/mL  Lactic acid, plasma [[id number]]   Collected: 07/04/22 1223   Updated: 07/04/22 1301   Specimen Type: Blood    Lactic Acid, Venous 1.4 mmol/L  CBC with Differential [[id number]] (Abnormal)   Collected: 07/04/22 1154   Updated: 07/04/22 1232   Specimen Type: Blood   Specimen Source: Vein    WBC 8.7 K/uL   RBC 4.10 Low  MIL/uL   Hemoglobin 14.8 g/dL   HCT 43.3 %   MCV 295.1 High  fL   MCH 36.1 High  pg   MCHC 34.7 g/dL   RDW 88.4 %   Platelets 166 K/uL   nRBC 0.0 %   Neutrophils Relative % 73 %   Neutro Abs 6.4 K/uL   Lymphocytes Relative 16 %   Lymphs Abs 1.4 K/uL   Monocytes Relative 9 %   Monocytes Absolute 0.8 K/uL   Eosinophils Relative 1 %   Eosinophils Absolute 0.0 K/uL   Basophils Relative 0 %   Basophils Absolute 0.0 K/uL   Immature Granulocytes 1 %   Abs Immature Granulocytes 0.05 K/uL   Imaging: PORTABLE CHEST 1 VIEW COMPARISON:  CT examination dated June 25, 2022   FINDINGS: The heart is enlarged. Atherosclerotic calcification of the aortic arch. Emphysematous changes and prominent interstitial lung markings consistent with chronic interstitial lung disease. No focal consolidation or pleural effusion.   IMPRESSION: 1. Cardiomegaly. 2. Emphysematous changes and prominent interstitial lung markings consistent with severe chronic interstitial lung disease. Acute on chronic infectious/inflammatory process can not be completely excluded.    Electronically Signed   By: Larose Hires D.O.   On: 07/04/2022 12:36   Plan of care: The patient is accepted for admission to Progressive unit, at  [facility]..    Author: Bobette Mo, MD 07/04/2022  Check [email or web address] for on-call coverage.  Nursing staff, Please call [facility] Admits & Consults System-Wide number on Amion as soon as patient's arrival, so appropriate admitting provider can evaluate the pt.

## 2022-07-04 NOTE — H&P (Signed)
History and Physical    [name] [name] GMW:[id number] DOB: [birth year] DOA: 07/04/2022  PCP: Chilton Greathouse, MD Patient coming from: Home.  Lives alone.  Uses cane at baseline.  Chief Complaint: Shortness of breath  HPI: [name] [name] is a 77 year old M with PMH of COPD/chronic hypoxic RF on 3 L, coronary artery calcification, HTN, HLD, anxiety, alcohol use and tobacco use (quit about 15 years ago) who presented to [facility] ED with progressive shortness of breath for about 2 weeks.  Patient reports progressive dyspnea for 2 weeks.  Also reports productive cough  with whitish for the last few days.  Dyspnea is worse with exertion.  Had difficulty ambulating from his bedroom to kitchen without desaturating to 80s and 70s that prompted him to call his pulmonologist who directed him to ED.  He denies chest pain, fever, chills, edema, orthopnea, PND, reflux, GI or UTI symptoms.  Reports runny nose in the morning that he attributes to oxygen.  Denies sore throat.  Denies sick contacts.  Reports good compliance with his inhalers.  Quit smoking about 15 years ago.  Smoked about 1.5 pack a day since he was a teenager before that.  Worked for [employer] but no exposure to chemicals.  He mainly worked on financial side.  Never worked on the farm.  Denies exposure to animals or birds.  Never had pets at home.  He admits to drinking 4 beers a day.  Denies withdrawal symptoms but reports anxiety for which she takes Xanax.  Denies recreational drug use.  He lives by himself.  Children live close by.  Uses cane at baseline.  Walks about 100 feet at baseline.   In ED, stable vitals.  Desaturated to 33s with exertion.  CMP and CBC without significant finding other than slightly elevated MCV to 104.  Lactic acid, BNP and serial troponin negative.  EKG normal sinus rhythm without acute ischemic finding or arrhythmia.  CXR suggests cardiomegaly, emphysema and prominent interstitial lung markings consistent  with chronic ILD versus acute on chronic infectious or inflammatory process.  COVID-19, influenza and RSV PCR nonreactive.  Full RVP ordered.  Patient received IV Solu-Medrol, ceftriaxone, Zithromax and breathing treatments.  Transferred to [facility] for [facility] care.   ROS All review of system negative except for pertinent positives and negatives as history of present illness above.  PMH Past Medical History:  Diagnosis Date   COPD (chronic obstructive pulmonary disease) (HCC)    PSH Past Surgical History:  Procedure Laterality Date   NASAL SINUS SURGERY  2007   Fam HX Family History  Problem Relation Age of Onset   Esophageal cancer Father     Social Hx  reports that he quit smoking about 14 years ago. His smoking use included cigarettes. He has a 80.00 pack-year smoking history. He has never been exposed to tobacco smoke. He has never used smokeless tobacco. He reports current alcohol use of about 12.0 standard drinks of alcohol per week. He reports that he does not use drugs.  Allergy Allergies  Allergen Reactions   Fluticasone-Salmeterol Shortness Of Breath and Other (See Comments)    "Worsening shortness of breath.."   Home Meds Prior to Admission medications   Medication Sig Start Date End Date Taking? Authorizing Provider  atorvastatin (LIPITOR) 20 MG tablet Take 20 mg by mouth daily. 07/01/22  Yes [provider]  OXYGEN Inhale 3 L/min into the lungs continuous.   Yes [provider]  sertraline (ZOLOFT) 50 MG tablet Take 50  mg by mouth daily. 05/30/22  Yes [provider]  albuterol (VENTOLIN HFA) 108 (90 Base) MCG/ACT inhaler Inhale 2 puffs into the lungs every 6 (six) hours as needed for wheezing or shortness of breath. 12/08/21   Oretha Milch, MD  ALPRAZolam Prudy Feeler) 0.25 MG tablet Take 1 tablet (0.25 mg total) by mouth daily as needed. 06/24/20   Glenford Bayley, NP  amLODipine (NORVASC) 5 MG tablet Take 5 mg by mouth daily.    [provider]  atorvastatin (LIPITOR) 40 MG tablet Take 40 mg by mouth daily. Pt takes two twice daily    [provider]  Budeson-Glycopyrrol-Formoterol (BREZTRI AEROSPHERE) 160-9-4.8 MCG/ACT AERO Inhale 2 puffs into the lungs in the morning and at bedtime. 04/05/21   Oretha Milch, MD  Budeson-Glycopyrrol-Formoterol (BREZTRI AEROSPHERE) 160-9-4.8 MCG/ACT AERO Inhale 2 puffs into the lungs in the morning and at bedtime. 02/05/22   Glenford Bayley, NP  furosemide (LASIX) 40 MG tablet Take 40 mg by mouth daily. 03/06/22   [provider]  ibuprofen (ADVIL,MOTRIN) 200 MG tablet Take 400 mg by mouth every 6 (six) hours as needed.    [provider]  Multiple Vitamins-Minerals (MULTIVITAMIN WITH MINERALS) tablet Take 1 tablet by mouth daily.    [provider]  Potassium Chloride ER 20 MEQ TBCR Take 1 tablet by mouth daily. 02/02/22   [provider]  predniSONE (DELTASONE) 10 MG tablet 4 tabs for 2 days, then 3 tabs for 2 days, 2 tabs for 2 days, then 1 tab for 2 days, then stop Patient not taking: Reported on 05/07/2022 02/05/22   Glenford Bayley, NP  sertraline (ZOLOFT) 25 MG tablet Take 25 mg by mouth daily.    [provider]  vitamin B-12 (CYANOCOBALAMIN) 1000 MCG tablet Take 1,000 mcg by mouth.    [provider]    Physical Exam: Vitals:   07/04/22 1415 07/04/22 1431 07/04/22 1444 07/04/22 1545  BP: (!) 145/57   (!) 130/52  Pulse: 88   (!) 109  Resp: (!) 28   18  Temp:   98.1 F (36.7 C) 98.6 F (37 C)  TempSrc:   Oral Oral  SpO2: 99% 100%  99%  Weight:      Height:        GENERAL: No acute distress.  Appears well.  HEENT: MMM.  Vision and hearing grossly intact.  NECK: Supple.  No apparent JVD.  RESP: Some work of breathing as he speaks.  Diminished aeration bilateral CVS:  RRR. Heart sounds normal.  ABD/GI/GU: Bowel sounds present. Soft. Non tender.  MSK/EXT:  Moves extremities. No apparent deformity or edema.   SKIN: no apparent skin lesion or wound NEURO: Awake, alert and oriented appropriately.  No gross deficit.  PSYCH: A little anxious with work of breathing.   Personally Reviewed Radiological Exams See HPI   Personally Reviewed Labs: CBC: Recent Labs  Lab 07/04/22 1154  WBC 8.7  NEUTROABS 6.4  HGB 14.8  HCT 42.7  MCV 104.1*  PLT 166   Basic Metabolic Panel: Recent Labs  Lab 07/04/22 1154  NA 134*  K 4.3  CL 92*  CO2 34*  GLUCOSE 98  BUN 9  CREATININE 0.66  CALCIUM 9.5   GFR: Estimated Creatinine Clearance: 98.5 mL/min (by C-G formula based on SCr of 0.66 mg/dL). Liver Function Tests: Recent Labs  Lab 07/04/22 1154  AST 29  ALT 18  ALKPHOS 124  BILITOT 0.9  PROT 7.3  ALBUMIN 3.9   No results for input(s): "LIPASE", "AMYLASE" in the last 168 hours. No results for input(s): "AMMONIA" in the last 168 hours. Coagulation Profile: No results for input(s): "INR", "PROTIME" in the last 168 hours. Cardiac Enzymes: No results for input(s): "CKTOTAL", "CKMB", "CKMBINDEX", "TROPONINI" in the last 168 hours. BNP (last 3 results) Recent Labs    02/05/22 1140  PROBNP 18.0   HbA1C: No results for input(s): "HGBA1C" in the last 72 hours. CBG: No results for input(s): "GLUCAP" in the last 168 hours. Lipid Profile: No results for input(s): "CHOL", "HDL", "LDLCALC", "TRIG", "CHOLHDL", "LDLDIRECT" in the last 72 hours. Thyroid Function Tests: No results for input(s): "TSH", "T4TOTAL", "FREET4", "T3FREE", "THYROIDAB" in the last 72 hours. Anemia Panel: No results for input(s): "VITAMINB12", "FOLATE", "FERRITIN", "TIBC", "IRON", "RETICCTPCT" in the last 72 hours. Urine analysis: No results found for: "COLORURINE", "APPEARANCEUR", "LABSPEC", "PHURINE", "GLUCOSEU", "HGBUR", "BILIRUBINUR", "KETONESUR", "PROTEINUR", "UROBILINOGEN", "NITRITE", "LEUKOCYTESUR"   Assessment and plan:  Principal Problem:   Acute on chronic respiratory failure with hypoxia (HCC) Active  Problems:   COPD with acute exacerbation (HCC)   Coronary artery calcification seen on CT scan   Essential hypertension   Former smoker   Alcoholic peripheral neuropathy (HCC)   Alcohol abuse   Macrocytosis    Acute on chronic respiratory failure with hypoxia due to COPD exacerbation and possible ILD: Presents with progressive dyspnea, DOE, productive cough and hypoxemia with exertion.  Hypoxemic to 70s on home 3 L with minimal activity.  CXR with cardiomegaly, emphysema and possible ILD.  No fever or leukocytosis.  Appears euvolemic on exam.  Low suspicion for PE.  His recent lung cancer screening CT showed new patchy and nodular consolidative opacity in posterior RUL concerning for infectious or inflammatory and atypical infection.  Serial troponin and BNP within normal.  Appears euvolemic on exam.  Had normal TTE in 2021.  Over 70-pack-year history before he quit 16 years ago.  Patient was sent here by his pulmonologist.  Received IV Solu-Medrol, CTX, azithromycin ox and nebulizers in ED. -Pulmonology consulted -Continue ceftriaxone and Zithromax -Brovana, Pulmicort and Yupelri -DuoNeb every 4 hours as needed -Defer further steroid to pulmonology -Wean oxygen as able -Check VBG and procalcitonin -Follow full RVP  Alcohol abuse: Reports drinking about 4 of the 12 ounce beers a day.  Denies major withdrawal symptoms. -Counseled on moderation or cessation -Closely monitor.  Initiate CIWA with as needed Ativan if he develops withdrawal symptoms. -Multivitamin, folic acid and thiamine  Coronary artery calcification: Seen by cardiology in 02/2022.  Medical management was recommended at that time. -Resume home meds after med rec  Essential hypertension: BP within acceptable range. -Resume home meds after med rec  Physical deconditioning -PT/OT eval  Macrocytosis: Likely related to alcohol. -Check B12 and folic acid  Anxiety -Home Xanax 0.25 mg 3 times daily as needed  Med rec  pending at the time of this dictation.   DVT prophylaxis: Lovenox  Code Status: Full code.  Discussed with patient Family Communication: Patient currently declined stating that he will call his sons.  Consults called: Pulmonology Admission status: Observation.   Almon Hercules MD [facility]  If 7PM-7AM, please contact night-coverage [email or web address]  07/04/2022, 5:02 PM

## 2022-07-04 NOTE — ED Notes (Signed)
Swabbed for 20 pathogen add on

## 2022-07-04 NOTE — ED Triage Notes (Signed)
Patient arrives with complaints of worsening shortness of breath x1 week. He wear 3L of oxygen at baseline and his oxygen has been dropping into the 70's with exertion/ambulation.   Patient reports that he has a hx of COPD and wears 3L of oxygen at baseline. Had bronchitis 4 months ago.

## 2022-07-04 NOTE — Consult Note (Signed)
NAME:  [name] [name], MRN:  [id number], DOB:  [birth year], LOS: 0 ADMISSION DATE:  07/04/2022, CONSULTATION DATE: 07/04/2022 REFERRING MD: Dr. Alanda Slim, CHIEF COMPLAINT: Shortness of breath, hypoxemia  History of Present Illness:  77 year old man with a former heavy tobacco history who has been followed in our office by Dr. Vassie Loll for COPD and chronic hypoxemic respiratory failure.  Also with a history of hypertension, hyperlipidemia.  He has typically been on 2 L/min continuous at home, 3-4 l/min by POC with marginal compliance. He called our office 6/25 when he was experiencing progressive dyspnea at rest and with exertion.  He also saw exertional desaturations to the 70s on 3 L/min.  He was admitted 07/04/2022 and therapy for COPD exacerbation was initiated: Solu-Medrol x 1, Brovana/Pulmicort, Yupelri, ceftriaxone/azithromycin.     Pertinent  Medical History   Past Medical History:  Diagnosis Date   COPD (chronic obstructive pulmonary disease) (HCC)   Chronic hypoxemic respiratory failure Hyperlipidemia CAD, medically managed Hypertension Depression Chronic edema, chronic left lower extremity wound  Significant Hospital Events: Including procedures, antibiotic start and stop dates in addition to other pertinent events   Pulmonary function testing 10/07/2012 > severe obstruction with positive bronchodilator response, normal lung volumes (consider pseudonormalization from coexisting restriction), decreased diffusion capacity Spirometry 03/18/2018 > severe obstruction, FEV1 1.32 L (44% predicted) Echocardiogram 10/05/2019 > normal LV function, normal RV size and function, CT chest 06/25/2022 >> no adenopathy, centrilobular and paraseptal emphysema, apex predominant, scattered tiny pulmonary nodules stable compared with priors, evolving/progressive posterior right upper lobe opacity consistent with postinflammatory scarring.  No evidence diffuse ILD Admission chest x-ray 07/04/2022 >>  cardiomegaly, emphysematous changes and some prominent interstitial markings.  No consolidation or definite infiltrate  Interim History / Subjective:     Objective   Blood pressure (!) 130/52, pulse (!) 109, temperature 98.6 F (37 C), temperature source Oral, resp. rate 18, height 5\' 11"  (1.803 m), weight 102.1 kg, SpO2 99 %.       No intake or output data in the 24 hours ending 07/04/22 1652 Filed Weights   07/04/22 1039  Weight: 102.1 kg    Examination: General: Overweight gentleman, somewhat anxious, no distress HENT: Oropharynx clear, strong voice, no stridor Lungs: Very distant, few end expiratory wheezes, definite wheeze on forced expiration Cardiovascular: Distant, regular, no murmur Abdomen: Obese, protuberant, nondistended with positive bowel sounds Extremities: No edema.  He does have some venous stasis changes on his shins Neuro: Awake, appropriate, answers questions, follows commands  Resolved Hospital Problem list     Assessment & Plan:   Acute on chronic hypoxemic respiratory failure Acute exacerbation COPD Posterior right upper lobe scar, interstitial prominence.  Suspect from prior inflammation/pneumonia. -Agree with nebulized long-acting BD therapy: Roxy Manns. -Back to Breztri at the time of discharge -Agree with inhaled steroid, nebulized budesonide -Start prednisone beginning 6/25 and taper.  He received Solu-Medrol today 6/26 -Okay to continue ceftriaxone, azithromycin for now.  In the absence of any clear infiltrate or pneumonia would be reasonable to stop the ceftriaxone, continue full course azithromycin on 6/27 -follow RVP result -Supplemental oxygen to maintain SpO2 > 90%.  He will need repeat walking oximetry prior to discharge to allow [place] to titrate his oxygen need.  This should be done with POC (pulsed flow oxygen) -Will consider high-resolution CT scan of the chest to better characterize posterior right upper lobe scarring.  No clear  evidence for diffuse or widespread ILD  Hypertension -Home amlodipine and furosemide are currently on hold -  Consider repeat echocardiogram to compare with 2021.  Question whether component of diastolic dysfunction contributing to his dyspnea, edema particularly given the interstitial prominence on his chest x-ray  Depression/anxiety -Should restart his Zoloft -Avoid medication that will suppress respiratory drive   Labs   CBC: Recent Labs  Lab 07/04/22 1154  WBC 8.7  NEUTROABS 6.4  HGB 14.8  HCT 42.7  MCV 104.1*  PLT 166    Basic Metabolic Panel: Recent Labs  Lab 07/04/22 1154  NA 134*  K 4.3  CL 92*  CO2 34*  GLUCOSE 98  BUN 9  CREATININE 0.66  CALCIUM 9.5   GFR: Estimated Creatinine Clearance: 98.5 mL/min (by C-G formula based on SCr of 0.66 mg/dL). Recent Labs  Lab 07/04/22 1154 07/04/22 1223 07/04/22 1407  WBC 8.7  --   --   LATICACIDVEN  --  1.4 1.6    Liver Function Tests: Recent Labs  Lab 07/04/22 1154  AST 29  ALT 18  ALKPHOS 124  BILITOT 0.9  PROT 7.3  ALBUMIN 3.9   No results for input(s): "LIPASE", "AMYLASE" in the last 168 hours. No results for input(s): "AMMONIA" in the last 168 hours.  ABG    Component Value Date/Time   HCO3 32.5 (H) 07/04/2022 1634   O2SAT 70.6 07/04/2022 1634      Review of Systems:   As per HPI  Past Medical History:  He,  has a past medical history of COPD (chronic obstructive pulmonary disease) (HCC).   Surgical History:   Past Surgical History:  Procedure Laterality Date   NASAL SINUS SURGERY  2007     Social History:   reports that he quit smoking about 14 years ago. His smoking use included cigarettes. He has a 80.00 pack-year smoking history. He has never been exposed to tobacco smoke. He has never used smokeless tobacco. He reports current alcohol use of about 12.0 standard drinks of alcohol per week. He reports that he does not use drugs.   Family History:  His family history includes  Esophageal cancer in his father.   Allergies Allergies  Allergen Reactions   Fluticasone-Salmeterol Shortness Of Breath and Other (See Comments)    "Worsening shortness of breath.."     Home Medications  Prior to Admission medications   Medication Sig Start Date End Date Taking? Authorizing Provider  atorvastatin (LIPITOR) 20 MG tablet Take 20 mg by mouth daily. 07/01/22  Yes [provider]  OXYGEN Inhale 3 L/min into the lungs continuous.   Yes [provider]  sertraline (ZOLOFT) 50 MG tablet Take 50 mg by mouth daily. 05/30/22  Yes [provider]  albuterol (VENTOLIN HFA) 108 (90 Base) MCG/ACT inhaler Inhale 2 puffs into the lungs every 6 (six) hours as needed for wheezing or shortness of breath. 12/08/21   Oretha Milch, MD  ALPRAZolam Prudy Feeler) 0.25 MG tablet Take 1 tablet (0.25 mg total) by mouth daily as needed. 06/24/20   Glenford Bayley, NP  amLODipine (NORVASC) 5 MG tablet Take 5 mg by mouth daily.    [provider]  atorvastatin (LIPITOR) 40 MG tablet Take 40 mg by mouth daily. Pt takes two twice daily    [provider]  Budeson-Glycopyrrol-Formoterol (BREZTRI AEROSPHERE) 160-9-4.8 MCG/ACT AERO Inhale 2 puffs into the lungs in the morning and at bedtime. 04/05/21   Oretha Milch, MD  Budeson-Glycopyrrol-Formoterol (BREZTRI AEROSPHERE) 160-9-4.8 MCG/ACT AERO Inhale 2 puffs into the lungs in the morning and at bedtime. 02/05/22  Glenford Bayley, NP  furosemide (LASIX) 40 MG tablet Take 40 mg by mouth daily. 03/06/22   [provider]  ibuprofen (ADVIL,MOTRIN) 200 MG tablet Take 400 mg by mouth every 6 (six) hours as needed.    [provider]  Multiple Vitamins-Minerals (MULTIVITAMIN WITH MINERALS) tablet Take 1 tablet by mouth daily.    [provider]  Potassium Chloride ER 20 MEQ TBCR Take 1 tablet by mouth daily. 02/02/22   [provider]  predniSONE (DELTASONE) 10 MG tablet 4 tabs for 2 days,  then 3 tabs for 2 days, 2 tabs for 2 days, then 1 tab for 2 days, then stop Patient not taking: Reported on 05/07/2022 02/05/22   Glenford Bayley, NP  sertraline (ZOLOFT) 25 MG tablet Take 25 mg by mouth daily.    [provider]  vitamin B-12 (CYANOCOBALAMIN) 1000 MCG tablet Take 1,000 mcg by mouth.    [provider]     Critical care time: NA       Levy Pupa, MD, PhD 07/04/2022, 4:52 PM [facility] [phone] or if no answer before 7:00PM call [phone] For any issues after 7:00PM please call eLink [phone]

## 2022-07-04 NOTE — ED Notes (Signed)
Spoke with lab to add on 20 pathogen resp panel

## 2022-07-04 NOTE — ED Notes (Signed)
[name] [name] at [employer] sending transport. Bed Ready at [facility] 4W Rm# [id number](NS)

## 2022-07-04 NOTE — ED Provider Notes (Signed)
[place] EMERGENCY DEPARTMENT AT [facility] Provider Note   CSN: [id number] Arrival date & time: 07/04/22  1028     History  Chief Complaint  Patient presents with   Shortness of Breath    [name] [name] is a 77 y.o. male.  The history is provided by the patient and medical records. No language interpreter was used.  Shortness of Breath Severity:  Moderate Onset quality:  Gradual Duration:  3 days Timing:  Intermittent Progression:  Waxing and waning Chronicity:  Recurrent Context: URI   Relieved by:  Nothing Worsened by:  Coughing and exertion Ineffective treatments:  Inhaler Associated symptoms: cough, sputum production and wheezing   Associated symptoms: no abdominal pain, no chest pain, no fever, no headaches, no hemoptysis and no vomiting        Home Medications Prior to Admission medications   Medication Sig Start Date End Date Taking? Authorizing Provider  atorvastatin (LIPITOR) 20 MG tablet Take 20 mg by mouth daily. 07/01/22  Yes [provider]  sertraline (ZOLOFT) 50 MG tablet Take 50 mg by mouth daily. 05/30/22  Yes [provider]  albuterol (VENTOLIN HFA) 108 (90 Base) MCG/ACT inhaler Inhale 2 puffs into the lungs every 6 (six) hours as needed for wheezing or shortness of breath. 12/08/21   Oretha Milch, MD  ALPRAZolam Prudy Feeler) 0.25 MG tablet Take 1 tablet (0.25 mg total) by mouth daily as needed. 06/24/20   Glenford Bayley, NP  amLODipine (NORVASC) 5 MG tablet Take 5 mg by mouth daily.    [provider]  atorvastatin (LIPITOR) 40 MG tablet Take 40 mg by mouth daily. Pt takes two twice daily    [provider]  Budeson-Glycopyrrol-Formoterol (BREZTRI AEROSPHERE) 160-9-4.8 MCG/ACT AERO Inhale 2 puffs into the lungs in the morning and at bedtime. 04/05/21   Oretha Milch, MD  Budeson-Glycopyrrol-Formoterol (BREZTRI AEROSPHERE) 160-9-4.8 MCG/ACT AERO Inhale 2 puffs into the lungs in the morning and at  bedtime. 02/05/22   Glenford Bayley, NP  furosemide (LASIX) 40 MG tablet Take 40 mg by mouth daily. 03/06/22   [provider]  ibuprofen (ADVIL,MOTRIN) 200 MG tablet Take 400 mg by mouth every 6 (six) hours as needed.    [provider]  Multiple Vitamins-Minerals (MULTIVITAMIN WITH MINERALS) tablet Take 1 tablet by mouth daily.    [provider]  Potassium Chloride ER 20 MEQ TBCR Take 1 tablet by mouth daily. 02/02/22   [provider]  predniSONE (DELTASONE) 10 MG tablet 4 tabs for 2 days, then 3 tabs for 2 days, 2 tabs for 2 days, then 1 tab for 2 days, then stop Patient not taking: Reported on 05/07/2022 02/05/22   Glenford Bayley, NP  sertraline (ZOLOFT) 25 MG tablet Take 25 mg by mouth daily.    [provider]  vitamin B-12 (CYANOCOBALAMIN) 1000 MCG tablet Take 1,000 mcg by mouth.    [provider]      Allergies    Fluticasone-salmeterol    Review of Systems   Review of Systems  Constitutional:  Positive for chills and fatigue. Negative for fever.  HENT:  Positive for congestion.   Respiratory:  Positive for cough, sputum production, chest tightness, shortness of breath and wheezing. Negative for hemoptysis.   Cardiovascular:  Negative for chest pain.  Gastrointestinal:  Negative for abdominal pain, constipation, diarrhea, nausea and vomiting.  Genitourinary:  Negative for dysuria and flank pain.  Musculoskeletal:  Negative for back pain.  Neurological:  Negative for light-headedness and headaches.  Psychiatric/Behavioral:  Negative for agitation.   All other systems reviewed and are negative.   Physical Exam Updated Vital Signs BP (!) 155/79 (BP Location: Left Arm)   Pulse 94   Temp 97.8 F (36.6 C) (Oral)   Resp 20   Ht 5\' 11"  (1.803 m)   Wt 102.1 kg   SpO2 98%   BMI 31.39 kg/m  Physical Exam Vitals and nursing note reviewed.  Constitutional:      General: He is not in acute distress.    Appearance: He is  well-developed. He is not diaphoretic.  HENT:     Head: Normocephalic and atraumatic.     Right Ear: External ear normal.     Left Ear: External ear normal.     Nose: Nose normal.     Mouth/Throat:     Pharynx: No oropharyngeal exudate.  Eyes:     Conjunctiva/sclera: Conjunctivae normal.     Pupils: Pupils are equal, round, and reactive to light.  Cardiovascular:     Rate and Rhythm: Normal rate.  Pulmonary:     Effort: No respiratory distress.     Breath sounds: No stridor. Wheezing and rhonchi present. No rales.  Chest:     Chest wall: No tenderness.  Abdominal:     Palpations: Abdomen is soft.     Tenderness: There is no abdominal tenderness. There is no guarding or rebound.  Musculoskeletal:     Cervical back: Normal range of motion and neck supple.     Right lower leg: No edema.     Left lower leg: No edema.  Skin:    General: Skin is warm.     Findings: No erythema or rash.  Neurological:     Mental Status: He is alert and oriented to person, place, and time.     Cranial Nerves: No cranial nerve deficit.     Motor: No abnormal muscle tone.     Coordination: Coordination normal.     Deep Tendon Reflexes: Reflexes normal.  Psychiatric:        Mood and Affect: Mood normal.     ED Results / Procedures / Treatments   Labs (all labs ordered are listed, but only abnormal results are displayed) Labs Reviewed  CBC WITH DIFFERENTIAL/PLATELET - Abnormal; Notable for the following components:      Result Value   RBC 4.10 (*)    MCV 104.1 (*)    MCH 36.1 (*)    All other components within normal limits  COMPREHENSIVE METABOLIC PANEL - Abnormal; Notable for the following components:   Sodium 134 (*)    Chloride 92 (*)    CO2 34 (*)    All other components within normal limits  BLOOD GAS, VENOUS - Abnormal; Notable for the following components:   Bicarbonate 32.5 (*)    Acid-Base Excess 6.9 (*)    All other components within normal limits  RESP PANEL BY RT-PCR (RSV,  FLU A&B, COVID)  RVPGX2  RESPIRATORY PANEL BY PCR  LACTIC ACID, PLASMA  LACTIC ACID, PLASMA  BRAIN NATRIURETIC PEPTIDE  PROCALCITONIN  PROCALCITONIN  RENAL FUNCTION PANEL  MAGNESIUM  CBC  HIV ANTIBODY (ROUTINE TESTING W REFLEX)  VITAMIN B12  FOLATE  TROPONIN I (HIGH SENSITIVITY)  TROPONIN I (HIGH SENSITIVITY)    EKG EKG Interpretation  Date/Time:  Wednesday July 04 2022 10:43:24 EDT Ventricular Rate:  87 PR Interval:  138 QRS Duration: 76 QT Interval:  364 QTC Calculation: 438 R  Axis:   65 Text Interpretation: Normal sinus rhythm Possible Anterior infarct , age undetermined Abnormal ECG No previous ECGs available no prior ECG for comparison. No STEMI Confirmed by Theda Belfast ([id number]) on 07/04/2022 11:04:00 AM  Radiology DG Chest Portable 1 View  Result Date: 07/04/2022 CLINICAL DATA:  Worsening productive cough. Shortness of breath and wheezing. EXAM: PORTABLE CHEST 1 VIEW COMPARISON:  CT examination dated June 25, 2022 FINDINGS: The heart is enlarged. Atherosclerotic calcification of the aortic arch. Emphysematous changes and prominent interstitial lung markings consistent with chronic interstitial lung disease. No focal consolidation or pleural effusion. IMPRESSION: 1. Cardiomegaly. 2. Emphysematous changes and prominent interstitial lung markings consistent with severe chronic interstitial lung disease. Acute on chronic infectious/inflammatory process can not be completely excluded. Electronically Signed   By: Larose Hires D.O.   On: 07/04/2022 12:36    Procedures Procedures    Medications Ordered in ED Medications  azithromycin (ZITHROMAX) 500 mg in sodium chloride 0.9 % 250 mL IVPB (has no administration in time range)  ipratropium-albuterol (DUONEB) 0.5-2.5 (3) MG/3ML nebulizer solution 3 mL (3 mLs Nebulization Given 07/04/22 1430)  albuterol (PROVENTIL) (2.5 MG/3ML) 0.083% nebulizer solution 2.5 mg (2.5 mg Nebulization Given 07/04/22 1056)  albuterol (PROVENTIL) (2.5  MG/3ML) 0.083% nebulizer solution 5 mg (5 mg Nebulization Given 07/04/22 1210)  albuterol (PROVENTIL) (2.5 MG/3ML) 0.083% nebulizer solution 5 mg (2.5 mg Nebulization Given 07/04/22 1056)  methylPREDNISolone sodium succinate (SOLU-MEDROL) 125 mg/2 mL injection 125 mg (125 mg Intravenous Given 07/04/22 1359)  cefTRIAXone (ROCEPHIN) 1 g in sodium chloride 0.9 % 100 mL IVPB (1 g Intravenous New Bag/Given 07/04/22 1404)    ED Course/ Medical Decision Making/ A&P                             Medical Decision Making Amount and/or Complexity of Data Reviewed Labs: ordered. Radiology: ordered.  Risk Prescription drug management. Decision regarding hospitalization.    [name] [name] is a 77 y.o. male with a past medical history significant for hypertension, hyperlipidemia, COPD on 3 L home oxygen, and previous bronchitis who presents with worsening cough and shortness of breath hypoxia.  According to patient, over the last several months he has had several COPD exacerbations requiring steroids and even had bronchitis earlier this year.  He said that over the last 2 weeks he has had worsening shortness of breath especially with exertion and he is having oxygen saturation drop into the 70s over the last few days with any exertion.  He reports he is having difficulty getting to the kitchen or to the bathroom to brush his teeth without getting winded.  This is not normal for him.  He reports has had more cough and is coughing up white sputum now.  He denies any focal chest pain or palpitations.  He denies documented fevers but has had some chills.  He denies any nausea, vomiting, constipation, diarrhea, or urinary changes.  He reports the edema in his legs is slightly improved from prior.  He had a CT scan recently that showed possible atypical infection about 9 days ago.  He was told to come in due to the hypoxia.  On exam, patient does have some wheezing and some rhonchi.  Minimal rales.  Chest and  abdomen nontender.  Back nontender.  Oxygen saturations in the 90s on his home 3 L at rest but it reportedly dropped into the 70s with any exertion.  EKG does not show  STEMI.  Will start workup to rule out concerning etiologies however I am concerned about possible pneumonia development versus COPD exacerbation.  Given his oxygen saturations going into the 70s, anticipate he will likely need admission.  1:26 PM Patient's oxygen saturations went to 90% on his home 3 L.  He was having more shortness of breath with coughing.  Will increase him to 5 L and improved to mid 90s.  X-ray returned showing COPD with possible acute on chronic infection.  Will discuss with medicine in regards to antibiotic decision but I do feel he needs admission with the oxygen saturation going to the 70s and breathing worsening.  Spoke to medicine who requested antibiotics and steroids.  They will admit patient for further management of intermittent hypoxia with suspected pneumonia.        Final Clinical Impression(s) / ED Diagnoses Final diagnoses:  Productive cough  Shortness of breath  Hypoxia   Clinical Impression: 1. Productive cough   2. Shortness of breath   3. Hypoxia     Disposition: Admit  This note was prepared with assistance of Dragon voice recognition software. Occasional wrong-word or sound-a-like substitutions may have occurred due to the inherent limitations of voice recognition software.      Aj Crunkleton, Canary Brim, MD 07/04/22 [phone]

## 2022-07-05 ENCOUNTER — Telehealth: Payer: Self-pay | Admitting: Acute Care

## 2022-07-05 ENCOUNTER — Inpatient Hospital Stay (HOSPITAL_COMMUNITY): Payer: PPO

## 2022-07-05 DIAGNOSIS — I700 Atherosclerosis of aorta: Secondary | ICD-10-CM | POA: Diagnosis not present

## 2022-07-05 DIAGNOSIS — M6281 Muscle weakness (generalized): Secondary | ICD-10-CM | POA: Diagnosis not present

## 2022-07-05 DIAGNOSIS — Z800 Family history of malignant neoplasm of digestive organs: Secondary | ICD-10-CM | POA: Diagnosis not present

## 2022-07-05 DIAGNOSIS — D7589 Other specified diseases of blood and blood-forming organs: Secondary | ICD-10-CM | POA: Diagnosis not present

## 2022-07-05 DIAGNOSIS — J439 Emphysema, unspecified: Secondary | ICD-10-CM | POA: Diagnosis not present

## 2022-07-05 DIAGNOSIS — R0609 Other forms of dyspnea: Secondary | ICD-10-CM | POA: Diagnosis not present

## 2022-07-05 DIAGNOSIS — Z87891 Personal history of nicotine dependence: Secondary | ICD-10-CM

## 2022-07-05 DIAGNOSIS — F1010 Alcohol abuse, uncomplicated: Secondary | ICD-10-CM | POA: Diagnosis not present

## 2022-07-05 DIAGNOSIS — Z888 Allergy status to other drugs, medicaments and biological substances status: Secondary | ICD-10-CM | POA: Diagnosis not present

## 2022-07-05 DIAGNOSIS — F411 Generalized anxiety disorder: Secondary | ICD-10-CM | POA: Diagnosis not present

## 2022-07-05 DIAGNOSIS — G621 Alcoholic polyneuropathy: Secondary | ICD-10-CM | POA: Diagnosis not present

## 2022-07-05 DIAGNOSIS — F410 Panic disorder [episodic paroxysmal anxiety] without agoraphobia: Secondary | ICD-10-CM | POA: Diagnosis not present

## 2022-07-05 DIAGNOSIS — I872 Venous insufficiency (chronic) (peripheral): Secondary | ICD-10-CM | POA: Diagnosis not present

## 2022-07-05 DIAGNOSIS — J984 Other disorders of lung: Secondary | ICD-10-CM | POA: Diagnosis not present

## 2022-07-05 DIAGNOSIS — Z6831 Body mass index (BMI) 31.0-31.9, adult: Secondary | ICD-10-CM | POA: Diagnosis not present

## 2022-07-05 DIAGNOSIS — I2510 Atherosclerotic heart disease of native coronary artery without angina pectoris: Secondary | ICD-10-CM | POA: Diagnosis not present

## 2022-07-05 DIAGNOSIS — E669 Obesity, unspecified: Secondary | ICD-10-CM | POA: Diagnosis not present

## 2022-07-05 DIAGNOSIS — I119 Hypertensive heart disease without heart failure: Secondary | ICD-10-CM | POA: Diagnosis not present

## 2022-07-05 DIAGNOSIS — K5900 Constipation, unspecified: Secondary | ICD-10-CM | POA: Diagnosis not present

## 2022-07-05 DIAGNOSIS — R2681 Unsteadiness on feet: Secondary | ICD-10-CM | POA: Diagnosis not present

## 2022-07-05 DIAGNOSIS — Z7951 Long term (current) use of inhaled steroids: Secondary | ICD-10-CM | POA: Diagnosis not present

## 2022-07-05 DIAGNOSIS — R2689 Other abnormalities of gait and mobility: Secondary | ICD-10-CM | POA: Diagnosis not present

## 2022-07-05 DIAGNOSIS — R0602 Shortness of breath: Secondary | ICD-10-CM | POA: Diagnosis present

## 2022-07-05 DIAGNOSIS — Z1152 Encounter for screening for COVID-19: Secondary | ICD-10-CM | POA: Diagnosis not present

## 2022-07-05 DIAGNOSIS — Z79899 Other long term (current) drug therapy: Secondary | ICD-10-CM | POA: Diagnosis not present

## 2022-07-05 DIAGNOSIS — R1312 Dysphagia, oropharyngeal phase: Secondary | ICD-10-CM | POA: Diagnosis not present

## 2022-07-05 DIAGNOSIS — F32A Depression, unspecified: Secondary | ICD-10-CM | POA: Diagnosis not present

## 2022-07-05 DIAGNOSIS — I10 Essential (primary) hypertension: Secondary | ICD-10-CM | POA: Diagnosis not present

## 2022-07-05 DIAGNOSIS — E785 Hyperlipidemia, unspecified: Secondary | ICD-10-CM | POA: Diagnosis not present

## 2022-07-05 DIAGNOSIS — K5909 Other constipation: Secondary | ICD-10-CM | POA: Diagnosis not present

## 2022-07-05 DIAGNOSIS — J441 Chronic obstructive pulmonary disease with (acute) exacerbation: Secondary | ICD-10-CM | POA: Diagnosis not present

## 2022-07-05 DIAGNOSIS — R911 Solitary pulmonary nodule: Secondary | ICD-10-CM

## 2022-07-05 DIAGNOSIS — J9621 Acute and chronic respiratory failure with hypoxia: Secondary | ICD-10-CM | POA: Diagnosis not present

## 2022-07-05 DIAGNOSIS — Z9981 Dependence on supplemental oxygen: Secondary | ICD-10-CM | POA: Diagnosis not present

## 2022-07-05 DIAGNOSIS — K219 Gastro-esophageal reflux disease without esophagitis: Secondary | ICD-10-CM | POA: Diagnosis not present

## 2022-07-05 LAB — ECHOCARDIOGRAM COMPLETE
AR max vel: 2.43 cm2
AV Area VTI: 2.4 cm2
AV Area mean vel: 2.32 cm2
AV Mean grad: 6 mmHg
AV Peak grad: 10.4 mmHg
Ao pk vel: 1.62 m/s
Area-P 1/2: 2.23 cm2
Calc EF: 65.3 %
Height: 71 in
MV VTI: 2.38 cm2
S' Lateral: 2.1 cm
Single Plane A2C EF: 64.4 %
Single Plane A4C EF: 65.7 %
Weight: 3601.43 oz

## 2022-07-05 LAB — CBC
HCT: 42.5 % (ref 39.0–52.0)
Hemoglobin: 14.2 g/dL (ref 13.0–17.0)
MCH: 35.6 pg — ABNORMAL HIGH (ref 26.0–34.0)
MCHC: 33.4 g/dL (ref 30.0–36.0)
MCV: 106.5 fL — ABNORMAL HIGH (ref 80.0–100.0)
Platelets: 170 10*3/uL (ref 150–400)
RBC: 3.99 MIL/uL — ABNORMAL LOW (ref 4.22–5.81)
RDW: 13.3 % (ref 11.5–15.5)
WBC: 6.2 10*3/uL (ref 4.0–10.5)
nRBC: 0 % (ref 0.0–0.2)

## 2022-07-05 LAB — RENAL FUNCTION PANEL
Albumin: 3.4 g/dL — ABNORMAL LOW (ref 3.5–5.0)
Anion gap: 9 (ref 5–15)
BUN: 10 mg/dL (ref 8–23)
CO2: 29 mmol/L (ref 22–32)
Calcium: 9 mg/dL (ref 8.9–10.3)
Chloride: 94 mmol/L — ABNORMAL LOW (ref 98–111)
Creatinine, Ser: 0.65 mg/dL (ref 0.61–1.24)
GFR, Estimated: >60 mL/min (ref >60)
Glucose, Bld: 164 mg/dL — ABNORMAL HIGH (ref 70–99)
Phosphorus: 4 mg/dL (ref 2.5–4.6)
Potassium: 4.8 mmol/L (ref 3.5–5.1)
Sodium: 132 mmol/L — ABNORMAL LOW (ref 135–145)

## 2022-07-05 LAB — PROCALCITONIN: Procalcitonin: <0.1 ng/mL

## 2022-07-05 LAB — C-REACTIVE PROTEIN: CRP: 1.1 mg/dL — ABNORMAL HIGH (ref <1.0)

## 2022-07-05 LAB — HIV ANTIBODY (ROUTINE TESTING W REFLEX): HIV Screen 4th Generation wRfx: NONREACTIVE

## 2022-07-05 LAB — SEDIMENTATION RATE: Sed Rate: 21 mm/hr — ABNORMAL HIGH (ref 0–16)

## 2022-07-05 LAB — MAGNESIUM: Magnesium: 1.8 mg/dL (ref 1.7–2.4)

## 2022-07-05 MED ORDER — AZITHROMYCIN 250 MG PO TABS
500.0000 mg | ORAL_TABLET | Freq: Every day | ORAL | Status: DC
  Administered 2022-07-05: 500 mg via ORAL
  Filled 2022-07-05: qty 2

## 2022-07-05 MED ORDER — GUAIFENESIN 100 MG/5ML PO LIQD
5.0000 mL | ORAL | Status: DC | PRN
  Administered 2022-07-06: 5 mL via ORAL
  Filled 2022-07-05: qty 10

## 2022-07-05 MED ORDER — ORAL CARE MOUTH RINSE: 15.0000 mL | OROMUCOSAL | Status: DC | PRN

## 2022-07-05 MED ORDER — ENSURE MAX PROTEIN PO LIQD
11.0000 [oz_av] | Freq: Every day | ORAL | Status: DC
  Administered 2022-07-05 – 2022-07-06 (×2): 11 [oz_av] via ORAL
  Filled 2022-07-05 (×2): qty 330

## 2022-07-05 NOTE — Progress Notes (Signed)
Initial Nutrition Assessment  DOCUMENTATION CODES:   Obesity unspecified  INTERVENTION:  - Liberalize to Regular diet.  - Ensure Max po BID, each supplement provides 150 kcal and 30 grams of protein.  - Multivitamin with minerals daily - Monitor weight trends.    NUTRITION DIAGNOSIS:   Increased nutrient needs related to chronic illness (COPD) as evidenced by estimated needs.  GOAL:   Patient will meet greater than or equal to 90% of their needs  MONITOR:   PO intake, Weight trends  REASON FOR ASSESSMENT:   Consult Assessment of nutrition requirement/status  ASSESSMENT:   77 year old M with PMH of COPD/chronic hypoxic RF on 3 L, coronary artery calcification, HTN, HLD, anxiety, alcohol use who presented with progressive shortness of breath for about 2 weeks. Admitted for acute on chronic respiratory failure with hypoxia due to COPD exacerbation  Patient reports UBW of 225# and denies any recent changes In weight.  Per EMR, weight stable since September.   Endorses eating around 3 meals a day. Has oatmeal and coffee for breakfast, a tortilla with salami or ham and cheese for lunch, and a dinner delivered (meat and 2 vegetables) or a frozen dinner.  Has Ensure/Equate on hand at home but drinks very rarely. Has had the same case of 6 for 6 months. Did drink two the days prior to admission due to feeling too short of breath to prepare any meals.   Current appetite is normal and patient reports eating well. Agreeable to receive once Ensure Max a day during admit.   Medications reviewed and include: vitamin B12, MVI, Lasix  Labs reviewed:  Na 132   NUTRITION - FOCUSED PHYSICAL EXAM:  Flowsheet Row Most Recent Value  Orbital Region No depletion  Upper Arm Region No depletion  Thoracic and Lumbar Region No depletion  Buccal Region Mild depletion  Temple Region No depletion  Clavicle Bone Region No depletion  Clavicle and Acromion Bone Region No depletion   Scapular Bone Region Unable to assess  Dorsal Hand No depletion  Patellar Region No depletion  Anterior Thigh Region No depletion  Posterior Calf Region No depletion  Edema (RD Assessment) None  Hair Reviewed  Eyes Reviewed  Mouth Reviewed  Skin Reviewed  Nails Reviewed       Diet Order:   Diet Order             Diet regular Room service appropriate? Yes; Fluid consistency: Thin  Diet effective now                   EDUCATION NEEDS:  Education needs have been addressed  Skin:  Skin Assessment: Reviewed RN Assessment  Last BM:  6/26  Height:  Ht Readings from Last 1 Encounters:  07/04/22 5\' 11"  (1.803 m)   Weight:  Wt Readings from Last 1 Encounters:  07/04/22 102.1 kg   BMI:  Body mass index is 31.39 kg/m.  Estimated Nutritional Needs:  Kcal:  1950-2150 kcals Protein:  75-90 grams Fluid:  >/= 1.9L    Shelle Iron RD, LDN For contact information, refer to [facility].

## 2022-07-05 NOTE — Evaluation (Signed)
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 Dx of Acute on chronic respiratory failure with hypoxia due to COPD exacerbation and possible ILD  Clinical Impression  Pt admitted with above diagnosis. Pt ambulated 15' + 10' with RW, SpO2 85% on 6L O2 walking, 98% on 4L O2 at rest, distance limited by 4/4 dyspnea. Pt has decreased activity tolerance compared to his baseline in which he can ambulate with a cane at least 100'. Rollator and HHPT recommended.  Pt currently with functional limitations due to the deficits listed below (see PT Problem List). Pt will benefit from acute skilled PT to increase their independence and safety with mobility to allow discharge.          Recommendations for follow up therapy are one component of a multi-disciplinary discharge planning process, led by the attending physician.  Recommendations may be updated based on patient status, additional functional criteria and insurance authorization.  Follow Up Recommendations       Assistance Recommended at Discharge Intermittent Supervision/Assistance  Patient can return home with the following  A little help with bathing/dressing/bathroom;Assistance with cooking/housework;Assist for transportation;Help with stairs or ramp for entrance    Equipment Recommendations Rollator (4 wheels)  Recommendations for Other Services       Functional Status Assessment Patient has had a recent decline in their functional status and demonstrates the ability to make significant improvements in function in a reasonable and predictable amount of time.     Precautions / Restrictions  Precautions Precautions: Fall Precaution Comments: denies falls in the past 6 months Restrictions Weight Bearing Restrictions: No      Mobility  Bed Mobility Overal bed mobility: Modified Independent             General bed mobility comments: HOB up, used rail    Transfers Overall transfer level: Needs assistance Equipment used: Rolling walker (2 wheels) Transfers: Sit to/from Stand Sit to Stand: Min guard           General transfer comment: VCs hand placement    Ambulation/Gait Ambulation/Gait assistance: Min guard Gait Distance (Feet): 15 Feet Assistive device: Rolling walker (2 wheels) Gait Pattern/deviations: Step-through pattern, Decreased stride length Gait velocity: WFL     General Gait Details: 16' + 10' with seated rest, steady with RW, walked from bed to toilet, distance limited by 4/4 dyspnea, SpO2 85% on 6L O2 walking, 98% on 4L at rest  Stairs            Wheelchair Mobility    Modified Rankin (Stroke Patients Only)       Balance Overall balance assessment: Modified Independent                                           Pertinent Vitals/Pain Pain Assessment Pain Assessment: No/denies pain    Home Living Family/patient expects to be discharged to:: Private residence Living Arrangements: Alone Available Help at Discharge: Family;Available PRN/intermittently   Home Access: Level entry       Home Layout: One level Home Equipment: Cane - single point Additional Comments: on  3L home O2; children are local and can help at times    Prior Function Prior Level of Function : Driving;Independent/Modified Independent             Mobility Comments: walks with SPC, no falls in past 6 months ADLs Comments: independent bathing/dressing; daughter does grocery shoppine     Hand Dominance        Extremity/Trunk Assessment   Upper Extremity Assessment Upper Extremity Assessment: Overall WFL for tasks assessed     Lower Extremity Assessment Lower Extremity Assessment: LLE deficits/detail;RLE deficits/detail RLE Deficits / Details: knee ext 5/5 RLE Sensation: history of peripheral neuropathy;decreased light touch LLE Deficits / Details: knee ext 5/5 LLE Sensation: history of peripheral neuropathy;decreased light touch    Cervical / Trunk Assessment Cervical / Trunk Assessment: Normal  Communication   Communication: No difficulties  Cognition Arousal/Alertness: Awake/alert Behavior During Therapy: WFL for tasks assessed/performed Overall Cognitive Status: Within Functional Limits for tasks assessed                                          General Comments      Exercises     Assessment/Plan    PT Assessment Patient needs continued PT services  PT Problem List Cardiopulmonary status limiting activity;Decreased activity tolerance       PT Treatment Interventions DME instruction;Gait training    PT Goals (Current goals can be found in the Care Plan section)  Acute Rehab PT Goals Patient Stated Goal: walk in the yard, go visit friends PT Goal Formulation: With patient Time For Goal Achievement: 07/19/22 Potential to Achieve Goals: Good    Frequency Min 1X/week     Co-evaluation               AM-PAC PT "6 Clicks" Mobility  Outcome Measure Help needed turning from your back to your side while in a flat bed without using bedrails?: None Help needed moving from lying on your back to sitting on the side of a flat bed without using bedrails?: None Help needed moving to and from a bed to a chair (including a wheelchair)?: A Little Help needed standing up from a chair using your arms (e.g., wheelchair or bedside chair)?: A Little Help needed to walk in hospital room?: A Little Help needed climbing 3-5 steps with a railing? : A Little 6 Click Score: 20    End of Session Equipment Utilized During Treatment: Gait belt;Oxygen Activity Tolerance: Patient limited  by fatigue Patient left: with call bell/phone within reach;in chair;with chair alarm set Nurse Communication: Mobility status PT Visit Diagnosis: Difficulty in walking, not elsewhere classified (R26.2)    Time: 1308-6578 PT Time Calculation (min) (ACUTE ONLY): 30 min   Charges:   PT Evaluation $PT Eval Moderate Complexity: 1 Mod PT Treatments $Gait Training: 8-22 mins       Ralene Bathe Kistler PT 07/05/2022  Acute Rehabilitation Services  Office [phone]

## 2022-07-05 NOTE — Progress Notes (Signed)
NAME:  [name] [name], MRN:  [id number], DOB:  September 02, 1945, LOS: 0 ADMISSION DATE:  07/04/2022, CONSULTATION DATE: 07/04/2022 REFERRING MD: Dr. Alanda Slim, CHIEF COMPLAINT: Shortness of breath, hypoxemia  History of Present Illness:  77 year old man with a former heavy tobacco history who has been followed in our office by Dr. Vassie Loll for COPD and chronic hypoxemic respiratory failure.  Also with a history of hypertension, hyperlipidemia.  He has typically been on 2 L/min continuous at home, 3-4 l/min by POC with marginal compliance. He called our office 6/25 when he was experiencing progressive dyspnea at rest and with exertion.  He also saw exertional desaturations to the 70s on 3 L/min.  He was admitted 07/04/2022 and therapy for COPD exacerbation was initiated: Solu-Medrol x 1, Brovana/Pulmicort, Yupelri, ceftriaxone/azithromycin.     Pertinent  Medical History   Past Medical History:  Diagnosis Date   COPD (chronic obstructive pulmonary disease) (HCC)   Chronic hypoxemic respiratory failure Hyperlipidemia CAD, medically managed Hypertension Depression Chronic edema, chronic left lower extremity wound  Significant Hospital Events: Including procedures, antibiotic start and stop dates in addition to other pertinent events   Pulmonary function testing 10/07/2012 > severe obstruction with positive bronchodilator response, normal lung volumes (consider pseudonormalization from coexisting restriction), decreased diffusion capacity Spirometry 03/18/2018 > severe obstruction, FEV1 1.32 L (44% predicted) Echocardiogram 10/05/2019 > normal LV function, normal RV size and function, CT chest 06/25/2022 >> no adenopathy, centrilobular and paraseptal emphysema, apex predominant, scattered tiny pulmonary nodules stable compared with priors, evolving/progressive posterior right upper lobe opacity consistent with postinflammatory scarring.  No evidence diffuse ILD Admission chest x-ray 07/04/2022 >>  cardiomegaly, emphysematous changes and some prominent interstitial markings.  No consolidation or definite infiltrate  Interim History / Subjective:   Feels a little bit better.  States that he is less anxious, less dyspneic RVP negative  Objective   Blood pressure (!) 140/79, pulse 81, temperature 98 F (36.7 C), temperature source Oral, resp. rate 18, height 5\' 11"  (1.803 m), weight 102.1 kg, SpO2 96 %.        Intake/Output Summary (Last 24 hours) at 07/05/2022 1129 Last data filed at 07/05/2022 0946 Gross per 24 hour  Intake 240 ml  Output 1500 ml  Net -1260 ml   Filed Weights   07/04/22 1039  Weight: 102.1 kg    Examination: General: Overweight gentleman, less anxious.  No distress HENT: Oropharynx clear, strong voice, no stridor Lungs: Very distant, few end expiratory wheezes, mild wheeze on forced expiration Cardiovascular: Distant, regular, no murmur Abdomen: Obese, protuberant, nondistended with positive bowel sounds Extremities: No edema.  He does have some venous stasis changes on his shins Neuro: Awake, appropriate, answers questions, follows commands  Resolved Hospital Problem list     Assessment & Plan:   Acute on chronic hypoxemic respiratory failure Acute exacerbation COPD Posterior right upper lobe scar, interstitial prominence.  Suspect from prior inflammation/pneumonia.  Confirmed on high-res CT chest 6/26 -Continue nebulized long-acting BD therapy: Roxy Manns. -Revert back to [place] at the time of discharge -Continue inhaled steroid, nebulized budesonide -Continue prednisone beginning 6/27 and taper.   -Continue full course azithromycin on 6/27.  Agree with stopping ceftriaxone -Will need to titrate his oxygen with ambulation prior to discharge -No evidence of ILD on his CT chest.  More consistent with some focal scar post infection or inflammation in the posterior right upper lobe -He will follow-up with Dr. Vassie Loll as an outpatient.  He may be  a good candidate to be  on daily azithromycin or even daily low-dose prednisone.  Hypertension -Home amlodipine and furosemide are currently on hold  Depression/anxiety -Continue Zoloft -Avoid medication that will suppress respiratory drive   Labs   CBC: Recent Labs  Lab 07/04/22 1154 07/05/22 0431  WBC 8.7 6.2  NEUTROABS 6.4  --   HGB 14.8 14.2  HCT 42.7 42.5  MCV 104.1* 106.5*  PLT 166 170    Basic Metabolic Panel: Recent Labs  Lab 07/04/22 1154 07/05/22 0431  NA 134* 132*  K 4.3 4.8  CL 92* 94*  CO2 34* 29  GLUCOSE 98 164*  BUN 9 10  CREATININE 0.66 0.65  CALCIUM 9.5 9.0  MG  --  1.8  PHOS  --  4.0   GFR: Estimated Creatinine Clearance: 98.5 mL/min (by C-G formula based on SCr of 0.65 mg/dL). Recent Labs  Lab 07/04/22 1154 07/04/22 1223 07/04/22 1357 07/04/22 1407 07/05/22 0431  PROCALCITON  --   --  <0.10  --  <0.10  WBC 8.7  --   --   --  6.2  LATICACIDVEN  --  1.4  --  1.6  --     Liver Function Tests: Recent Labs  Lab 07/04/22 1154 07/05/22 0431  AST 29  --   ALT 18  --   ALKPHOS 124  --   BILITOT 0.9  --   PROT 7.3  --   ALBUMIN 3.9 3.4*   No results for input(s): "LIPASE", "AMYLASE" in the last 168 hours. No results for input(s): "AMMONIA" in the last 168 hours.  ABG    Component Value Date/Time   HCO3 32.5 (H) 07/04/2022 1634   O2SAT 70.6 07/04/2022 1634     Critical care time: NA       Levy Pupa, MD, PhD 07/05/2022, 11:29 AM [facility] [phone] or if no answer before 7:00PM call [phone] For any issues after 7:00PM please call eLink [phone]

## 2022-07-05 NOTE — Telephone Encounter (Signed)
LR 4 B>> Admitted to [facility] 6/26 and seen my [place] 6/27. Pt. Was seen by Dr. Delton Coombes today at [facility] as an inpatient. He was admitted 6/26 with a COPD flare. As an inpatient he had a HRCT today which was negative for ILD, and confirmed probable chronic postinfectious or inflammatory scarring in the right upper lobe, which was the nodule of concern in the 06/25/22 LDCT.  Plan will be for a follow up LDCT  late September 2024 to re-evaluate the area of concern.  Please place order for 3 month follow up LDCT and fax results to PCP, letting them know plan of care.  There was also notation of Aortic atherosclerosis, in addition to left main and three-vessel coronary artery disease. Patient had an echo and is scheduled for one today. D,D, E please fax results to PCP and lt them know plan is for a 3 month follow up in late September 2024. Thanks so much

## 2022-07-05 NOTE — Evaluation (Signed)
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 Dx of Acute on chronic respiratory failure with hypoxia due to COPD exacerbation and possible ILD   Clinical Impression   Pt lives alone with support of his daughter for IADLs. He walks with a cane and drives on occasion. Pt requires an extended period of time to complete ADLs and has nearly stopped showering due to dyspnea (does not sit or wear O2 in shower). Pt prepares simple meals. Presents with decreased activity tolerance, impaired standing balance and generalized weakness. He needs min guard assist for mobility with RW and up to min assist for ADLs. Recommending HHOT upon discharge.      Recommendations for follow up therapy are one component of a multi-disciplinary discharge planning process, led by the attending physician.  Recommendations may be updated based on patient status, additional functional criteria and insurance authorization.   Assistance Recommended at Discharge Set up Supervision/Assistance  Patient can return home with the following Assistance with cooking/housework;Assist for transportation;Help with stairs or ramp for entrance    Functional Status Assessment  Patient has had a recent decline in their functional status and demonstrates the ability to make significant improvements in function in a reasonable and predictable amount of time.  Equipment Recommendations  None recommended by OT    Recommendations for Other Services       Precautions / Restrictions Precautions Precautions: Fall Precaution Comments: 3L baseline Restrictions Weight Bearing Restrictions: No       Mobility Bed Mobility               General bed mobility comments: in chair    Transfers Overall transfer level: Needs assistance Equipment used: Rolling walker (2 wheels) Transfers: Sit to/from Stand Sit to Stand: Min guard           General transfer comment: VCs hand placement      Balance                                           ADL either performed or assessed with clinical judgement   ADL Overall ADL's : Needs assistance/impaired Eating/Feeding: Independent;Sitting   Grooming: Min guard;Standing;Wash/dry hands   Upper Body Bathing: Set up;Sitting   Lower Body Bathing: Minimal assistance;Sit to/from stand   Upper Body Dressing : Set up;Sitting   Lower Body Dressing: Min guard;Sit to/from stand   Toilet Transfer: Min guard;Ambulation;Rolling walker (2 wheels)   Toileting- Clothing Manipulation and Hygiene: Min guard;Sit to/from stand;Sitting/lateral lean       Functional mobility during ADLs: Min guard;Rolling walker (2 wheels)       Vision Baseline Vision/History: 1 Wears glasses Ability to See in Adequate Light: 0 Adequate Patient Visual Report: No change from baseline       Perception     Praxis      Pertinent Vitals/Pain Pain Assessment Pain Assessment: No/denies pain     Hand Dominance Right   Extremity/Trunk Assessment Upper Extremity Assessment Upper Extremity Assessment: Overall WFL for tasks assessed   Lower Extremity Assessment Lower Extremity Assessment: Defer to PT evaluation  Cervical / Trunk Assessment Cervical / Trunk Assessment: Normal   Communication Communication Communication: No difficulties   Cognition Arousal/Alertness: Awake/alert Behavior During Therapy: WFL for tasks assessed/performed Overall Cognitive Status: Within Functional Limits for tasks assessed                                       General Comments       Exercises     Shoulder Instructions       Home Living Family/patient expects to be discharged to:: Private residence Living Arrangements: Alone Available Help at Discharge: Family;Available PRN/intermittently Type of Home: Apartment Home Access: Level entry     Home Layout: One level     Bathroom Shower/Tub: Tub/shower unit         Home Equipment: Cane - single point;Shower seat          Prior Functioning/Environment Prior Level of Function : Independent/Modified Independent;Driving             Mobility Comments: walks with SPC, no falls in past 6 months ADLs Comments: sponge bathes primarily, although he has a shower seat, takes an extended period to get ready for his day, prepares simple meals, daughter helps with groceries and housekeeping        OT Problem List: Decreased activity tolerance;Impaired balance (sitting and/or standing);Cardiopulmonary status limiting activity;Decreased knowledge of use of DME or AE      OT Treatment/Interventions: Self-care/ADL training;Energy conservation;DME and/or AE instruction;Patient/family education;Balance training;Therapeutic activities    OT Goals(Current goals can be found in the care plan section) Acute Rehab OT Goals OT Goal Formulation: With patient Time For Goal Achievement: 07/19/22 Potential to Achieve Goals: Good ADL Goals Pt Will Perform Grooming: with modified independence;standing (at least 2 activities) Pt Will Perform Lower Body Bathing: with modified independence;sit to/from stand;with adaptive equipment Pt Will Perform Lower Body Dressing: with modified independence;sit to/from stand Pt Will Transfer to Toilet: with modified independence;ambulating Pt Will Perform Toileting - Clothing Manipulation and hygiene: with modified independence;sit to/from stand Additional ADL Goal #1: Pt will generalize energy conservation and pursed lip breathing strategies in ADLs and mobility. Additional ADL Goal #2: Pt will be knowledgeable in availability of  compression sock donner.  OT Frequency: Min 1X/week    Co-evaluation              AM-PAC OT "6 Clicks" Daily Activity     Outcome Measure Help from another person eating meals?: None Help from another person taking care of personal grooming?: A Little Help from another person toileting, which includes using toliet, bedpan, or urinal?: A Little Help from another person bathing (including washing, rinsing, drying)?: A Little Help from another person to put on and taking off regular upper body clothing?: A Little Help from another person to put on and taking off regular lower body clothing?: A Little 6 Click Score: 19   End of Session Equipment Utilized During Treatment: Rolling walker (2 wheels);Gait belt;Oxygen (4L)  Activity Tolerance: Patient tolerated treatment well Patient left: in chair;with call bell/phone within reach;with chair alarm set  OT Visit Diagnosis: Unsteadiness on feet (R26.81);Other (comment) (decreased activity tolerance)                Time: 1433-1500 OT Time Calculation (min): 27 min Charges:  OT General Charges $OT Visit: 1 Visit OT Evaluation $OT Eval Moderate Complexity: 1 Mod OT Treatments $Self Care/Home Management : 8-22 mins Raynelle Fanning  M, OTR/L Acute Rehabilitation Services Office: [phone]  Evern Bio 07/05/2022, 3:47 PM

## 2022-07-05 NOTE — Progress Notes (Signed)
PROGRESS NOTE  [name] [name] KVQ:[id number] DOB: [birth year]   PCP: Chilton Greathouse, MD  Patient is from: Home.  Lives alone.  Uses cane at baseline.  DOA: 07/04/2022 LOS: 0  Chief complaints Chief Complaint  Patient presents with   Shortness of Breath     Brief Narrative / Interim history: 77 year old M with PMH of COPD/chronic hypoxic RF on 3 L, coronary artery calcification, HTN, HLD, anxiety, alcohol use and tobacco use (quit about 15 years ago) who presented to [facility] ED with progressive shortness of breath for about 2 weeks and admitted for acute on chronic respiratory failure with hypoxia due to COPD exacerbation.  Desaturated to 74s with exertion on 3 L.  CXR suggested cardiomegaly, emphysema and prominent interstitial lung markings.  Respiratory viral panel negative.  Patient was started on systemic steroid, antibiotics and nebulizers.  Pulmonology consulted.  High-resolution CT negative for ILD but probable air trapping, bronchomalacia, aortic atherosclerosis and calcification of aortic and mitral valve    Subjective: Seen and examined earlier this morning.  No major events overnight of this morning.  Reports some improvement in his breathing.  Still with notable work of breathing as he speaks.  Cough with whitish phlegm.  Denies chest pain.  No fever or leukocytosis.  Pro-Cal negative.  Objective: Vitals:   07/04/22 2311 07/05/22 0302 07/05/22 0732 07/05/22 0817  BP: (!) 164/74 131/86  (!) 140/79  Pulse: 87 81  81  Resp: 19 20  18   Temp: 98 F (36.7 C) 97.9 F (36.6 C)  98 F (36.7 C)  TempSrc: Oral Oral  Oral  SpO2: 98% 98% 98% 96%  Weight:      Height:        Examination:  GENERAL: No apparent distress.  Nontoxic. HEENT: MMM.  Vision and hearing grossly intact.  NECK: Supple.  No apparent JVD.  RESP: Some work of breathing as he speaks.  Diminished aeration bilaterally.  Rhonchorous. CVS:  RRR. Heart sounds normal.  ABD/GI/GU: BS+. Abd soft, NTND.   MSK/EXT:  Moves extremities. No apparent deformity. No edema.  SKIN: BLE skin changes consistent with chronic venous insufficiency NEURO: Awake, alert and oriented appropriately.  No apparent focal neuro deficit. PSYCH: Calm. Normal affect.   Procedures:  None  Microbiology summarized: COVID-19, influenza and RSV PCR nonreactive Full RVP nonreactive  Assessment and plan: Principal Problem:   Acute on chronic respiratory failure with hypoxia (HCC) Active Problems:   COPD with acute exacerbation (HCC)   Coronary artery calcification seen on CT scan   Essential hypertension   Former smoker   Alcoholic peripheral neuropathy (HCC)   Alcohol abuse   Macrocytosis  Acute on chronic respiratory failure with hypoxia due to COPD exacerbation: Presents with progressive dyspnea, DOE, productive cough and hypoxemia with exertion.  Hypoxemic to 70s on home 3 L with minimal activity.  Has a 70-pack-year history before he quit smoking 16 years ago.  Followed at [facility] pulmonology.  Appears euvolemic on exam.  Pro-Cal negative.  VBG normal.  High-resolution CT as above.  Slowly improving. -Appreciate input by pulm -On prednisone taper per pulmonology -Discontinue ceftriaxone.  Continue Zithromax. -Continue Brovana, Pulmicort and Yupelri -Continue DuoNeb every 4 hours as needed -Wean oxygen as able.  Minimum oxygen to keep saturation above 88% -IS, OOB/PT/OT   Alcohol abuse: Reports drinking about 4 of the 12 ounce beers a day.  No withdrawal symptoms. -Counseled on moderation or cessation -Closely monitor.  Initiate CIWA with as needed Ativan if he develops  withdrawal symptoms. -Multivitamin, folic acid and thiamine   Coronary artery calcification/aortic and mitral valve calcification: Seen by cardiology in 02/2022.  Medical management was recommended at that time. -Resumed home meds -Echocardiogram   Essential hypertension: BP within acceptable range. -Resumed home meds -Would avoid  amlodipine given venous insufficiency   Physical deconditioning -PT/OT eval   Macrocytosis: Likely related to alcohol.  B12 and folic acid within normal   Anxiety -Home Xanax 0.25 mg 3 times daily as needed  Aortic atherosclerosis -Continue statin.  May need high intensity. -Start low-dose aspirin -Check lipid panel in the morning   Obesity Body mass index is 31.39 kg/m.           DVT prophylaxis:  enoxaparin (LOVENOX) injection 40 mg Start: 07/04/22 1800  Code Status: Full code Family Communication: None at bedside Level of care: Med-Surg Status is: Observation The patient will require care spanning > 2 midnights and should be moved to inpatient because: Acute on chronic respiratory failure with hypoxia due to COPD exacerbation   Final disposition: TBD pending PT/OT eval Consultants:  Pulmonology  55 minutes with more than 50% spent in reviewing records, counseling patient/family and coordinating care.   Sch Meds:  Scheduled Meds:  arformoterol  15 mcg Nebulization BID   atorvastatin  20 mg Oral Daily   budesonide (PULMICORT) nebulizer solution  0.5 mg Nebulization BID   cyanocobalamin  1,000 mcg Oral Daily   enoxaparin (LOVENOX) injection  40 mg Subcutaneous Q24H   furosemide  40 mg Oral Daily   multivitamin with minerals  1 tablet Oral Q breakfast   potassium chloride SA  20 mEq Oral Daily   [START ON 07/13/2022] predniSONE  10 mg Oral Q breakfast   [START ON 07/09/2022] predniSONE  20 mg Oral Q breakfast   predniSONE  30 mg Oral Q breakfast   Ensure Max Protein  11 oz Oral Daily   revefenacin  175 mcg Nebulization Daily   sertraline  100 mg Oral Daily   Continuous Infusions:  azithromycin     PRN Meds:.ALPRAZolam, ipratropium-albuterol, mouth rinse  Antimicrobials: Anti-infectives (From admission, onward)    Start     Dose/Rate Route Frequency Ordered Stop   07/05/22 1800  azithromycin (ZITHROMAX) 500 mg in sodium chloride 0.9 % 250 mL IVPB         500 mg 250 mL/hr over 60 Minutes Intravenous Every 24 hours 07/04/22 1653 07/09/22 1759   07/05/22 1400  cefTRIAXone (ROCEPHIN) 2 g in sodium chloride 0.9 % 100 mL IVPB  Status:  Discontinued        2 g 200 mL/hr over 30 Minutes Intravenous Every 24 hours 07/04/22 1653 07/05/22 0642   07/04/22 1400  cefTRIAXone (ROCEPHIN) 1 g in sodium chloride 0.9 % 100 mL IVPB        1 g 200 mL/hr over 30 Minutes Intravenous  Once 07/04/22 1349 07/04/22 1900   07/04/22 1400  azithromycin (ZITHROMAX) 500 mg in sodium chloride 0.9 % 250 mL IVPB        500 mg 250 mL/hr over 60 Minutes Intravenous  Once 07/04/22 1349 07/04/22 1900        I have personally reviewed the following labs and images: CBC: Recent Labs  Lab 07/04/22 1154 07/05/22 0431  WBC 8.7 6.2  NEUTROABS 6.4  --   HGB 14.8 14.2  HCT 42.7 42.5  MCV 104.1* 106.5*  PLT 166 170   BMP &GFR Recent Labs  Lab 07/04/22 1154 07/05/22 0431  NA  134* 132*  K 4.3 4.8  CL 92* 94*  CO2 34* 29  GLUCOSE 98 164*  BUN 9 10  CREATININE 0.66 0.65  CALCIUM 9.5 9.0  MG  --  1.8  PHOS  --  4.0   Estimated Creatinine Clearance: 98.5 mL/min (by C-G formula based on SCr of 0.65 mg/dL). Liver & Pancreas: Recent Labs  Lab 07/04/22 1154 07/05/22 0431  AST 29  --   ALT 18  --   ALKPHOS 124  --   BILITOT 0.9  --   PROT 7.3  --   ALBUMIN 3.9 3.4*   No results for input(s): "LIPASE", "AMYLASE" in the last 168 hours. No results for input(s): "AMMONIA" in the last 168 hours. Diabetic: No results for input(s): "HGBA1C" in the last 72 hours. No results for input(s): "GLUCAP" in the last 168 hours. Cardiac Enzymes: No results for input(s): "CKTOTAL", "CKMB", "CKMBINDEX", "TROPONINI" in the last 168 hours. Recent Labs    02/05/22 1140  PROBNP 18.0   Coagulation Profile: No results for input(s): "INR", "PROTIME" in the last 168 hours. Thyroid Function Tests: No results for input(s): "TSH", "T4TOTAL", "FREET4", "T3FREE", "THYROIDAB" in the  last 72 hours. Lipid Profile: No results for input(s): "CHOL", "HDL", "LDLCALC", "TRIG", "CHOLHDL", "LDLDIRECT" in the last 72 hours. Anemia Panel: Recent Labs    07/04/22 1755  VITAMINB12 2,270*  FOLATE 12.8   Urine analysis: No results found for: "COLORURINE", "APPEARANCEUR", "LABSPEC", "PHURINE", "GLUCOSEU", "HGBUR", "BILIRUBINUR", "KETONESUR", "PROTEINUR", "UROBILINOGEN", "NITRITE", "LEUKOCYTESUR" Sepsis Labs: Invalid input(s): "PROCALCITONIN", "LACTICIDVEN"  Microbiology: Recent Results (from the past 240 hour(s))  Resp panel by RT-PCR (RSV, Flu A&B, Covid) Anterior Nasal Swab     Status: None   Collection Time: 07/04/22 12:23 PM   Specimen: Anterior Nasal Swab  Result Value Ref Range Status   SARS Coronavirus 2 by RT PCR NEGATIVE NEGATIVE Final    Comment: (NOTE) SARS-CoV-2 target nucleic acids are NOT DETECTED.  The SARS-CoV-2 RNA is generally detectable in upper respiratory specimens during the acute phase of infection. The lowest concentration of SARS-CoV-2 viral copies this assay can detect is 138 copies/mL. A negative result does not preclude SARS-Cov-2 infection and should not be used as the sole basis for treatment or other patient management decisions. A negative result may occur with  improper specimen collection/handling, submission of specimen other than nasopharyngeal swab, presence of viral mutation(s) within the areas targeted by this assay, and inadequate number of viral copies(<138 copies/mL). A negative result must be combined with clinical observations, patient history, and epidemiological information. The expected result is Negative.  Fact Sheet for Patients:  BloggerCourse.com  Fact Sheet for Healthcare Providers:  SeriousBroker.it  This test is no t yet approved or cleared by the [place] FDA and  has been authorized for detection and/or diagnosis of SARS-CoV-2 by FDA under an Emergency Use  Authorization (EUA). This EUA will remain  in effect (meaning this test can be used) for the duration of the COVID-19 declaration under Section 564(b)(1) of the Act, 21 U.S.C.section 360bbb-3(b)(1), unless the authorization is terminated  or revoked sooner.       Influenza A by PCR NEGATIVE NEGATIVE Final   Influenza B by PCR NEGATIVE NEGATIVE Final    Comment: (NOTE) The Xpert Xpress SARS-CoV-2/FLU/RSV plus assay is intended as an aid in the diagnosis of influenza from Nasopharyngeal swab specimens and should not be used as a sole basis for treatment. Nasal washings and aspirates are unacceptable for Xpert Xpress SARS-CoV-2/FLU/RSV testing.  Fact  Sheet for Patients: BloggerCourse.com  Fact Sheet for Healthcare Providers: SeriousBroker.it  This test is not yet approved or cleared by the [place] FDA and has been authorized for detection and/or diagnosis of SARS-CoV-2 by FDA under an Emergency Use Authorization (EUA). This EUA will remain in effect (meaning this test can be used) for the duration of the COVID-19 declaration under Section 564(b)(1) of the Act, 21 U.S.C. section 360bbb-3(b)(1), unless the authorization is terminated or revoked.     Resp Syncytial Virus by PCR NEGATIVE NEGATIVE Final    Comment: (NOTE) Fact Sheet for Patients: BloggerCourse.com  Fact Sheet for Healthcare Providers: SeriousBroker.it  This test is not yet approved or cleared by the [place] FDA and has been authorized for detection and/or diagnosis of SARS-CoV-2 by FDA under an Emergency Use Authorization (EUA). This EUA will remain in effect (meaning this test can be used) for the duration of the COVID-19 declaration under Section 564(b)(1) of the Act, 21 U.S.C. section 360bbb-3(b)(1), unless the authorization is terminated or revoked.  Performed at [name], [address] [place], [place] [address]   Respiratory (~20 pathogens) panel by PCR     Status: None   Collection Time: 07/04/22 12:23 PM   Specimen: Nasopharyngeal Swab; Respiratory  Result Value Ref Range Status   Adenovirus NOT DETECTED NOT DETECTED Final   Coronavirus 229E NOT DETECTED NOT DETECTED Final    Comment: (NOTE) The Coronavirus on the Respiratory Panel, DOES NOT test for the novel  Coronavirus (2019 nCoV)    Coronavirus HKU1 NOT DETECTED NOT DETECTED Final   Coronavirus NL63 NOT DETECTED NOT DETECTED Final   Coronavirus OC43 NOT DETECTED NOT DETECTED Final   Metapneumovirus NOT DETECTED NOT DETECTED Final   Rhinovirus / Enterovirus NOT DETECTED NOT DETECTED Final   Influenza A NOT DETECTED NOT DETECTED Final   Influenza B NOT DETECTED NOT DETECTED Final   Parainfluenza Virus 1 NOT DETECTED NOT DETECTED Final   Parainfluenza Virus 2 NOT DETECTED NOT DETECTED Final   Parainfluenza Virus 3 NOT DETECTED NOT DETECTED Final   Parainfluenza Virus 4 NOT DETECTED NOT DETECTED Final   Respiratory Syncytial Virus NOT DETECTED NOT DETECTED Final   Bordetella pertussis NOT DETECTED NOT DETECTED Final   Bordetella Parapertussis NOT DETECTED NOT DETECTED Final   Chlamydophila pneumoniae NOT DETECTED NOT DETECTED Final   Mycoplasma pneumoniae NOT DETECTED NOT DETECTED Final    Comment: Performed at [facility], [address] [place], [place] [address]    Radiology Studies: CT Chest High Resolution  Result Date: 07/05/2022 CLINICAL DATA:  77 year old male with suspected interstitial lung disease. EXAM: CT CHEST WITHOUT CONTRAST TECHNIQUE: Multidetector CT imaging of the chest was performed following the standard protocol without intravenous contrast. High resolution imaging of the lungs, as well as inspiratory and expiratory imaging, was performed. RADIATION DOSE REDUCTION: This exam was performed according to the departmental dose-optimization program which includes  automated exposure control, adjustment of the mA and/or kV according to patient size and/or use of iterative reconstruction technique. COMPARISON:  Low-dose lung cancer screening chest CT 06/25/2022. FINDINGS: Cardiovascular: Heart size is normal. There is no significant pericardial fluid, thickening or pericardial calcification. There is aortic atherosclerosis, as well as atherosclerosis of the great vessels of the mediastinum and the coronary arteries, including calcified atherosclerotic plaque in the left main, left anterior descending, left circumflex and right coronary arteries. Mild calcifications of the aortic valve. Moderate calcifications of the mitral annulus. Mediastinum/Nodes: No pathologically enlarged mediastinal  or hilar lymph nodes. Please note that accurate exclusion of hilar adenopathy is limited on noncontrast CT scans. Esophagus is unremarkable in appearance. No axillary lymphadenopathy. Lungs/Pleura: Diffuse bronchial wall thickening with moderate centrilobular and paraseptal emphysema. There is some focal thickening of the peribronchovascular interstitium and regional architectural distortion in the right upper lobe, similar to the prior examination, presumably an area of chronic post infectious or inflammatory scarring. High-resolution images otherwise demonstrate no generalized regions of ground-glass attenuation, septal thickening, subpleural reticulation, traction bronchiectasis or honeycombing to indicate interstitial lung disease. Inspiratory and expiratory imaging is considered nondiagnostic as it appears that both phases are largely during expiration, however, there is likely air trapping. In addition, there is some collapse of the main bronchi indicative of bronchomalacia. Multiple other smaller pulmonary nodules appear similar to the recent lung cancer screening chest CT. Upper Abdomen: Aortic atherosclerosis. Musculoskeletal: There are no aggressive appearing lytic or blastic lesions  noted in the visualized portions of the skeleton. IMPRESSION: 1. No definitive imaging findings to suggest interstitial lung disease. 2. Probable chronic postinfectious or inflammatory scarring in the right upper lobe, as above. As previously recommended, repeat lung cancer screening chest CT is suggested in late September 2024 for repeat evaluation. 3. Diffuse bronchial wall thickening with moderate centrilobular and paraseptal emphysema; imaging findings suggestive of underlying COPD. 4. Probable air trapping, suggesting small airways disease. 5. Bronchomalacia. 6. Aortic atherosclerosis, in addition to left main and three-vessel coronary artery disease. Assessment for potential risk factor modification, dietary therapy or pharmacologic therapy may be warranted, if clinically indicated. 7. There are calcifications of the aortic valve and mitral annulus. Echocardiographic correlation for evaluation of potential valvular dysfunction may be warranted if clinically indicated. Aortic Atherosclerosis (ICD10-I70.0) and Emphysema (ICD10-J43.9). Electronically Signed   By: Trudie Reed M.D.   On: 07/05/2022 06:46   DG Chest Portable 1 View  Result Date: 07/04/2022 CLINICAL DATA:  Worsening productive cough. Shortness of breath and wheezing. EXAM: PORTABLE CHEST 1 VIEW COMPARISON:  CT examination dated June 25, 2022 FINDINGS: The heart is enlarged. Atherosclerotic calcification of the aortic arch. Emphysematous changes and prominent interstitial lung markings consistent with chronic interstitial lung disease. No focal consolidation or pleural effusion. IMPRESSION: 1. Cardiomegaly. 2. Emphysematous changes and prominent interstitial lung markings consistent with severe chronic interstitial lung disease. Acute on chronic infectious/inflammatory process can not be completely excluded. Electronically Signed   By: Larose Hires D.O.   On: 07/04/2022 12:36      Klayton Monie T. Zelie Asbill [facility]  If 7PM-7AM, please  contact night-coverage [email or web address] 07/05/2022, 11:03 AM

## 2022-07-05 NOTE — Plan of Care (Signed)

## 2022-07-06 ENCOUNTER — Telehealth: Payer: Self-pay | Admitting: Emergency Medicine

## 2022-07-06 DIAGNOSIS — G621 Alcoholic polyneuropathy: Secondary | ICD-10-CM | POA: Diagnosis not present

## 2022-07-06 DIAGNOSIS — F1010 Alcohol abuse, uncomplicated: Secondary | ICD-10-CM | POA: Diagnosis not present

## 2022-07-06 DIAGNOSIS — J441 Chronic obstructive pulmonary disease with (acute) exacerbation: Secondary | ICD-10-CM | POA: Diagnosis not present

## 2022-07-06 DIAGNOSIS — R0602 Shortness of breath: Secondary | ICD-10-CM

## 2022-07-06 DIAGNOSIS — J9621 Acute and chronic respiratory failure with hypoxia: Secondary | ICD-10-CM | POA: Diagnosis not present

## 2022-07-06 LAB — CBC
HCT: 41.4 % (ref 39.0–52.0)
Hemoglobin: 14.2 g/dL (ref 13.0–17.0)
MCH: 36.3 pg — ABNORMAL HIGH (ref 26.0–34.0)
MCHC: 34.3 g/dL (ref 30.0–36.0)
MCV: 105.9 fL — ABNORMAL HIGH (ref 80.0–100.0)
Platelets: 159 10*3/uL (ref 150–400)
RBC: 3.91 MIL/uL — ABNORMAL LOW (ref 4.22–5.81)
RDW: 13.4 % (ref 11.5–15.5)
WBC: 9.5 10*3/uL (ref 4.0–10.5)
nRBC: 0 % (ref 0.0–0.2)

## 2022-07-06 LAB — RENAL FUNCTION PANEL
Albumin: 3.1 g/dL — ABNORMAL LOW (ref 3.5–5.0)
Anion gap: 9 (ref 5–15)
BUN: 13 mg/dL (ref 8–23)
CO2: 31 mmol/L (ref 22–32)
Calcium: 8.9 mg/dL (ref 8.9–10.3)
Chloride: 93 mmol/L — ABNORMAL LOW (ref 98–111)
Creatinine, Ser: 0.71 mg/dL (ref 0.61–1.24)
GFR, Estimated: >60 mL/min (ref >60)
Glucose, Bld: 118 mg/dL — ABNORMAL HIGH (ref 70–99)
Phosphorus: 3.9 mg/dL (ref 2.5–4.6)
Potassium: 3.8 mmol/L (ref 3.5–5.1)
Sodium: 133 mmol/L — ABNORMAL LOW (ref 135–145)

## 2022-07-06 LAB — MAGNESIUM: Magnesium: 2 mg/dL (ref 1.7–2.4)

## 2022-07-06 LAB — LIPID PANEL
Cholesterol: 127 mg/dL (ref 0–200)
HDL: 85 mg/dL (ref >40)
LDL Cholesterol: 34 mg/dL (ref 0–99)
Total CHOL/HDL Ratio: 1.5 RATIO
Triglycerides: 41 mg/dL (ref <150)
VLDL: 8 mg/dL (ref 0–40)

## 2022-07-06 MED ORDER — ASPIRIN 81 MG PO TBEC
81.0000 mg | DELAYED_RELEASE_TABLET | Freq: Every day | ORAL | Status: DC
  Administered 2022-07-07: 81 mg via ORAL
  Filled 2022-07-06 (×3): qty 1

## 2022-07-06 MED ORDER — AZITHROMYCIN 250 MG PO TABS: 250.0000 mg | ORAL_TABLET | Freq: Every day | ORAL | 0 refills | Status: DC

## 2022-07-06 MED ORDER — AZITHROMYCIN 250 MG PO TABS
500.0000 mg | ORAL_TABLET | Freq: Every day | ORAL | Status: AC
  Administered 2022-07-06 – 2022-07-08 (×3): 500 mg via ORAL
  Filled 2022-07-06 (×3): qty 2

## 2022-07-06 MED ORDER — PREDNISONE 10 MG PO TABS: ORAL_TABLET | ORAL | 0 refills | Status: DC

## 2022-07-06 MED ORDER — METHYLPREDNISOLONE SODIUM SUCC 125 MG IJ SOLR
80.0000 mg | Freq: Every day | INTRAMUSCULAR | Status: DC
  Administered 2022-07-06 – 2022-07-07 (×2): 80 mg via INTRAVENOUS
  Filled 2022-07-06 (×2): qty 2

## 2022-07-06 NOTE — TOC Transition Note (Signed)
Transition of Care [facility]) - CM/SW Discharge Note   Patient Details  Name: ADILSON VANNORT MRN: [id number] Date of Birth: [birth year]  [facility] CM/SW Contact:  Larrie Kass, LCSW Phone Number: 07/06/2022, 10:15 AM   Clinical Narrative:    CSW spoke with pt regarding Home health and DME rec. Pt has agreed with no preference  in DME or [facility] agency. Pt was accepted with [facility] for [facility] PT/OT. CSW sent referral to [employer] for rolling walker, will be delivered to the room prior to d/c . Pt reports he may need transportation , and will let CSW know.     Final next level of care: Home w Home Health Services Barriers to Discharge: Continued Medical Work up   Patient Goals and CMS Choice CMS Medicare.gov Compare Post Acute Care list provided to:: Patient Choice offered to / list presented to : Patient  Discharge Placement                      Patient and family notified of of transfer: 07/06/22  Discharge Plan and Services Additional resources added to the After Visit Summary for                            [facility] Arranged: PT, OT [facility] Agency: [facility] Date [facility] Agency Contacted: 07/06/22 Time HH Agency Contacted: [phone] Representative spoke with at [facility] Agency: Amy  Social Determinants of Health (SDOH) Interventions SDOH Screenings   Food Insecurity: No Food Insecurity (07/04/2022)  Housing: High Risk (07/04/2022)  Transportation Needs: No Transportation Needs (07/04/2022)  Utilities: Not At Risk (07/04/2022)  Depression (PHQ2-9): Low Risk  (03/09/2019)  Tobacco Use: Medium Risk (07/04/2022)     Readmission Risk Interventions     No data to display

## 2022-07-06 NOTE — Telephone Encounter (Signed)
Noted in triage. Encounter closed.

## 2022-07-06 NOTE — Telephone Encounter (Signed)
PT is in hospital and Dr. Delton Coombes has been to see him there or at least call He wonders if Dr. B or Dr. Vassie Loll could stop by again to see him. Please call @ [phone] to advise.    Room; 1431 (4 Chad)  [facility].

## 2022-07-06 NOTE — Plan of Care (Signed)

## 2022-07-06 NOTE — Progress Notes (Signed)
PROGRESS NOTE  [name] [name] NWG:[id number] DOB: [birth year]   PCP: Chilton Greathouse, MD  Patient is from: Home.  Lives alone.  Uses cane at baseline.  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  Desaturated to 68s with exertion on 3 L.  CXR suggested cardiomegaly, emphysema and prominent interstitial lung markings.  Respiratory viral panel negative.  Patient was started on systemic steroid, antibiotics and nebulizers.  Pulmonology consulted.  High-resolution CT negative for ILD but probable air trapping, bronchomalacia, aortic atherosclerosis and calcification of aortic and mitral valve.  TTE without significant finding.  Still with significant respiratory distress.  PCCM following.  Therapy recommended SNF.    Subjective: Seen and examined earlier this morning.  Reports having a rough night due to coughing fit.  Felt to be winded when he tried to go to the bathroom.  Very anxious to go home.  He lives alone.  Asking if he can go to rehab.   Objective: Vitals:   07/06/22 0532 07/06/22 0910 07/06/22 1342 07/06/22 1446  BP: (!) 143/84   (!) 159/68  Pulse: 87   92  Resp: 18   20  Temp: 97.9 F (36.6 C)   98.3 F (36.8 C)  TempSrc: Oral   Oral  SpO2: 98% 98% (!) 79% 97%  Weight:      Height:        Examination:  GENERAL: No apparent distress.  Nontoxic. HEENT: MMM.  Vision and hearing grossly intact.  NECK: Supple.  No apparent JVD.  RESP: Diminished aeration bilaterally.  Rhonchi and wheeze. CVS:  RRR. Heart sounds normal.  ABD/GI/GU: BS+. Abd  soft, NTND.  MSK/EXT:   No apparent deformity. Moves extremities. No edema.  SKIN: no apparent skin lesion or wound NEURO: Awake and alert. Oriented appropriately.  No apparent focal neuro deficit. PSYCH: Appears anxious.  Procedures:  None  Microbiology summarized: COVID-19, influenza and RSV PCR nonreactive Full RVP nonreactive  Assessment and plan: Principal Problem:   Acute on chronic respiratory failure with hypoxia (HCC) Active Problems:   COPD with acute exacerbation (HCC)   Coronary artery calcification seen on CT scan   Essential hypertension   Former smoker   Alcoholic peripheral neuropathy (HCC)   Alcohol abuse   Macrocytosis  Acute on chronic respiratory failure with hypoxia due to COPD exacerbation: Presents with progressive dyspnea, DOE, productive cough and hypoxemia with exertion.  Hypoxemic to 70s on home 3 L with minimal activity.  Has a 70-pack-year history before he quit smoking 16 years ago.  Followed at [facility] pulmonology.  Appears euvolemic on exam.  Pro-Cal negative.  VBG normal.  High-resolution CT as above.  Still with significant respiratory distress.  Anxious to go home. -Discontinue prednisone taper.  Start IV Solu-Medrol -Continue Zithromax, Brovana, Pulmicort and Yupelri. -Continue DuoNeb every 4 hours as needed -Wean oxygen as able.  Minimum oxygen to keep saturation above 88% -IS, OOB/PT/OT and ambulatory saturation -Pulmonology to reevaluate patient again   Alcohol abuse: Reports drinking about 4 of the 12 ounce beers a day.  No withdrawal symptoms. -  Counseled on moderation or cessation -Closely monitor.  Initiate CIWA with as needed Ativan if he develops withdrawal symptoms. -Multivitamin, folic acid and thiamine   Coronary artery calcification/aortic and mitral valve calcification: Seen by cardiology in 02/2022.  Medical management was recommended at that time.  TTE without significant finding.  LDL 34. -Continue home meds.   Essential  hypertension: BP within acceptable range. -Continue home meds. -Would avoid amlodipine given venous insufficiency   Physical deconditioning -PT/OT recommended S tenderness.   Macrocytosis: Likely related to alcohol.  B12 and folic acid within normal   Anxiety -Continue Xanax 0.25 mg 3 times daily as needed  Aortic atherosclerosis: LDL 34. -Continue statin.  -Start low-dose aspirin   Obesity Body mass index is 31.39 kg/m. Nutrition Problem: Increased nutrient needs Etiology: chronic illness (COPD) Signs/Symptoms: estimated needs Interventions: Refer to RD note for recommendations, [employer]   DVT prophylaxis:  enoxaparin (LOVENOX) injection 40 mg Start: 07/04/22 1800  Code Status: Full code Family Communication: None at bedside Level of care: Med-Surg Status is: Inpatient The patient will remain inpatient because: Acute on chronic respiratory failure with hypoxia due to COPD exacerbation   Final disposition: SNF. Consultants:  Pulmonology  55 minutes with more than 50% spent in reviewing records, counseling patient/family and coordinating care.   Sch Meds:  Scheduled Meds:  arformoterol  15 mcg Nebulization BID   atorvastatin  20 mg Oral Daily   azithromycin  500 mg Oral Daily   budesonide (PULMICORT) nebulizer solution  0.5 mg Nebulization BID   cyanocobalamin  1,000 mcg Oral Daily   enoxaparin (LOVENOX) injection  40 mg Subcutaneous Q24H   furosemide  40 mg Oral Daily   methylPREDNISolone (SOLU-MEDROL) injection  80 mg Intravenous Daily   multivitamin with minerals  1 tablet Oral Q breakfast   potassium chloride SA  20 mEq Oral Daily   Ensure Max Protein  11 oz Oral Daily   revefenacin  175 mcg Nebulization Daily   sertraline  100 mg Oral Daily   Continuous Infusions:   PRN Meds:.ALPRAZolam, guaiFENesin, ipratropium-albuterol, mouth rinse  Antimicrobials: Anti-infectives (From admission, onward)    Start     Dose/Rate Route Frequency Ordered  Stop   07/06/22 0900  azithromycin (ZITHROMAX) tablet 500 mg        500 mg Oral Daily 07/06/22 0809 07/09/22 0959   07/06/22 0000  azithromycin (ZITHROMAX) 250 MG tablet        250 mg Oral Daily 07/06/22 0812 07/12/22 2359   07/05/22 1800  azithromycin (ZITHROMAX) 500 mg in sodium chloride 0.9 % 250 mL IVPB  Status:  Discontinued        500 mg 250 mL/hr over 60 Minutes Intravenous Every 24 hours 07/04/22 1653 07/05/22 1157   07/05/22 1400  cefTRIAXone (ROCEPHIN) 2 g in sodium chloride 0.9 % 100 mL IVPB  Status:  Discontinued        2 g 200 mL/hr over 30 Minutes Intravenous Every 24 hours 07/04/22 1653 07/05/22 0642   07/05/22 1245  azithromycin (ZITHROMAX) tablet 500 mg  Status:  Discontinued        500 mg Oral Daily 07/05/22 1157 07/06/22 0809   07/04/22 1400  cefTRIAXone (ROCEPHIN) 1 g in sodium chloride 0.9 % 100 mL IVPB        1 g 200 mL/hr over 30 Minutes Intravenous  Once 07/04/22 1349 07/04/22 1900   07/04/22 1400  azithromycin (ZITHROMAX) 500 mg in sodium chloride 0.9 % 250 mL IVPB  500 mg 250 mL/hr over 60 Minutes Intravenous  Once 07/04/22 1349 07/04/22 1900        I have personally reviewed the following labs and images: CBC: Recent Labs  Lab 07/04/22 1154 07/05/22 0431 07/06/22 0403  WBC 8.7 6.2 9.5  NEUTROABS 6.4  --   --   HGB 14.8 14.2 14.2  HCT 42.7 42.5 41.4  MCV 104.1* 106.5* 105.9*  PLT 166 170 159   BMP &GFR Recent Labs  Lab 07/04/22 1154 07/05/22 0431 07/06/22 0403  NA 134* 132* 133*  K 4.3 4.8 3.8  CL 92* 94* 93*  CO2 34* 29 31  GLUCOSE 98 164* 118*  BUN 9 10 13   CREATININE 0.66 0.65 0.71  CALCIUM 9.5 9.0 8.9  MG  --  1.8 2.0  PHOS  --  4.0 3.9   Estimated Creatinine Clearance: 98.5 mL/min (by C-G formula based on SCr of 0.71 mg/dL). Liver & Pancreas: Recent Labs  Lab 07/04/22 1154 07/05/22 0431 07/06/22 0403  AST 29  --   --   ALT 18  --   --   ALKPHOS 124  --   --   BILITOT 0.9  --   --   PROT 7.3  --   --   ALBUMIN 3.9  3.4* 3.1*   No results for input(s): "LIPASE", "AMYLASE" in the last 168 hours. No results for input(s): "AMMONIA" in the last 168 hours. Diabetic: No results for input(s): "HGBA1C" in the last 72 hours. No results for input(s): "GLUCAP" in the last 168 hours. Cardiac Enzymes: No results for input(s): "CKTOTAL", "CKMB", "CKMBINDEX", "TROPONINI" in the last 168 hours. Recent Labs    02/05/22 1140  PROBNP 18.0   Coagulation Profile: No results for input(s): "INR", "PROTIME" in the last 168 hours. Thyroid Function Tests: No results for input(s): "TSH", "T4TOTAL", "FREET4", "T3FREE", "THYROIDAB" in the last 72 hours. Lipid Profile: Recent Labs    07/06/22 0403  CHOL 127  HDL 85  LDLCALC 34  TRIG 41  CHOLHDL 1.5   Anemia Panel: Recent Labs    07/04/22 1755  VITAMINB12 2,270*  FOLATE 12.8   Urine analysis: No results found for: "COLORURINE", "APPEARANCEUR", "LABSPEC", "PHURINE", "GLUCOSEU", "HGBUR", "BILIRUBINUR", "KETONESUR", "PROTEINUR", "UROBILINOGEN", "NITRITE", "LEUKOCYTESUR" Sepsis Labs: Invalid input(s): "PROCALCITONIN", "LACTICIDVEN"  Microbiology: Recent Results (from the past 240 hour(s))  Resp panel by RT-PCR (RSV, Flu A&B, Covid) Anterior Nasal Swab     Status: None   Collection Time: 07/04/22 12:23 PM   Specimen: Anterior Nasal Swab  Result Value Ref Range Status   SARS Coronavirus 2 by RT PCR NEGATIVE NEGATIVE Final    Comment: (NOTE) SARS-CoV-2 target nucleic acids are NOT DETECTED.  The SARS-CoV-2 RNA is generally detectable in upper respiratory specimens during the acute phase of infection. The lowest concentration of SARS-CoV-2 viral copies this assay can detect is 138 copies/mL. A negative result does not preclude SARS-Cov-2 infection and should not be used as the sole basis for treatment or other patient management decisions. A negative result may occur with  improper specimen collection/handling, submission of specimen other than nasopharyngeal  swab, presence of viral mutation(s) within the areas targeted by this assay, and inadequate number of viral copies(<138 copies/mL). A negative result must be combined with clinical observations, patient history, and epidemiological information. The expected result is Negative.  Fact Sheet for Patients:  BloggerCourse.com  Fact Sheet for Healthcare Providers:  SeriousBroker.it  This test is no t yet approved or cleared by the [place] FDA  and  has been authorized for detection and/or diagnosis of SARS-CoV-2 by FDA under an Emergency Use Authorization (EUA). This EUA will remain  in effect (meaning this test can be used) for the duration of the COVID-19 declaration under Section 564(b)(1) of the Act, 21 U.S.C.section 360bbb-3(b)(1), unless the authorization is terminated  or revoked sooner.       Influenza A by PCR NEGATIVE NEGATIVE Final   Influenza B by PCR NEGATIVE NEGATIVE Final    Comment: (NOTE) The Xpert Xpress SARS-CoV-2/FLU/RSV plus assay is intended as an aid in the diagnosis of influenza from Nasopharyngeal swab specimens and should not be used as a sole basis for treatment. Nasal washings and aspirates are unacceptable for Xpert Xpress SARS-CoV-2/FLU/RSV testing.  Fact Sheet for Patients: BloggerCourse.com  Fact Sheet for Healthcare Providers: SeriousBroker.it  This test is not yet approved or cleared by the [place] FDA and has been authorized for detection and/or diagnosis of SARS-CoV-2 by FDA under an Emergency Use Authorization (EUA). This EUA will remain in effect (meaning this test can be used) for the duration of the COVID-19 declaration under Section 564(b)(1) of the Act, 21 U.S.C. section 360bbb-3(b)(1), unless the authorization is terminated or revoked.     Resp Syncytial Virus by PCR NEGATIVE NEGATIVE Final    Comment: (NOTE) Fact Sheet for  Patients: BloggerCourse.com  Fact Sheet for Healthcare Providers: SeriousBroker.it  This test is not yet approved or cleared by the [place] FDA and has been authorized for detection and/or diagnosis of SARS-CoV-2 by FDA under an Emergency Use Authorization (EUA). This EUA will remain in effect (meaning this test can be used) for the duration of the COVID-19 declaration under Section 564(b)(1) of the Act, 21 U.S.C. section 360bbb-3(b)(1), unless the authorization is terminated or revoked.  Performed at [name], [address] [place], [place] [address]   Respiratory (~20 pathogens) panel by PCR     Status: None   Collection Time: 07/04/22 12:23 PM   Specimen: Nasopharyngeal Swab; Respiratory  Result Value Ref Range Status   Adenovirus NOT DETECTED NOT DETECTED Final   Coronavirus 229E NOT DETECTED NOT DETECTED Final    Comment: (NOTE) The Coronavirus on the Respiratory Panel, DOES NOT test for the novel  Coronavirus (2019 nCoV)    Coronavirus HKU1 NOT DETECTED NOT DETECTED Final   Coronavirus NL63 NOT DETECTED NOT DETECTED Final   Coronavirus OC43 NOT DETECTED NOT DETECTED Final   Metapneumovirus NOT DETECTED NOT DETECTED Final   Rhinovirus / Enterovirus NOT DETECTED NOT DETECTED Final   Influenza A NOT DETECTED NOT DETECTED Final   Influenza B NOT DETECTED NOT DETECTED Final   Parainfluenza Virus 1 NOT DETECTED NOT DETECTED Final   Parainfluenza Virus 2 NOT DETECTED NOT DETECTED Final   Parainfluenza Virus 3 NOT DETECTED NOT DETECTED Final   Parainfluenza Virus 4 NOT DETECTED NOT DETECTED Final   Respiratory Syncytial Virus NOT DETECTED NOT DETECTED Final   Bordetella pertussis NOT DETECTED NOT DETECTED Final   Bordetella Parapertussis NOT DETECTED NOT DETECTED Final   Chlamydophila pneumoniae NOT DETECTED NOT DETECTED Final   Mycoplasma pneumoniae NOT DETECTED NOT DETECTED Final    Comment:  Performed at [facility], [address], [place], [place] [address]    Radiology Studies: ECHOCARDIOGRAM COMPLETE  Result Date: 07/05/2022    ECHOCARDIOGRAM REPORT   Patient Name:   KUNAAL MALAVE Polak Date of Exam: 07/05/2022 Medical Rec #:  [id number]  Height:       71.0 in Accession #:    [id number]           Weight:       225.1 lb Date of Birth:  January 09, 1946            BSA:          2.217 m Patient Age:    76 years             BP:           148/69 mmHg Patient Gender: M                    HR:           91 bpm. Exam Location:  Inpatient Procedure: 2D Echo, Cardiac Doppler and Color Doppler Indications:    Dyspnea  History:        Patient has prior history of Echocardiogram examinations, most                 recent 10/05/2019. COPD, Signs/Symptoms:acute respiratory                 failure, Shortness of Breath and Dyspnea; Risk                 Factors:Hypertension, Former Smoker, alcohol abuse and                 Dyslipidemia.  Sonographer:    Wallie Char Referring Phys: [id number] Keairra Bardon T Sya Nestler IMPRESSIONS  1. Left ventricular ejection fraction, by estimation, is 65 to 70%. The left ventricle has normal function. The left ventricle has no regional wall motion abnormalities. Left ventricular diastolic parameters are consistent with Grade I diastolic dysfunction (impaired relaxation).  2. Right ventricular systolic function is normal. The right ventricular size is normal.  3. Left atrial size was mildly dilated.  4. Right atrial size was mildly dilated.  5. The mitral valve is normal in structure. No evidence of mitral valve regurgitation. No evidence of mitral stenosis.  6. The aortic valve is tricuspid. There is mild calcification of the aortic valve. Aortic valve regurgitation is not visualized. Aortic valve sclerosis/calcification is present, without any evidence of aortic stenosis. FINDINGS  Left Ventricle: Left ventricular ejection fraction, by estimation, is 65 to 70%. The left ventricle  has normal function. The left ventricle has no regional wall motion abnormalities. The left ventricular internal cavity size was normal in size. There is  no left ventricular hypertrophy. Left ventricular diastolic parameters are consistent with Grade I diastolic dysfunction (impaired relaxation). Right Ventricle: The right ventricular size is normal. No increase in right ventricular wall thickness. Right ventricular systolic function is normal. Left Atrium: Left atrial size was mildly dilated. Right Atrium: Right atrial size was mildly dilated. Pericardium: There is no evidence of pericardial effusion. Mitral Valve: The mitral valve is normal in structure. Mild to moderate mitral annular calcification. No evidence of mitral valve regurgitation. No evidence of mitral valve stenosis. MV peak gradient, 7.2 mmHg. The mean mitral valve gradient is 4.0 mmHg. Tricuspid Valve: The tricuspid valve is normal in structure. Tricuspid valve regurgitation is trivial. No evidence of tricuspid stenosis. Aortic Valve: The aortic valve is tricuspid. There is mild calcification of the aortic valve. Aortic valve regurgitation is not visualized. Aortic valve sclerosis/calcification is present, without any evidence of aortic stenosis. Aortic valve mean gradient measures 6.0 mmHg. Aortic valve peak gradient measures 10.4 mmHg. Aortic valve area, by VTI measures 2.40 cm.  Pulmonic Valve: The pulmonic valve was normal in structure. Pulmonic valve regurgitation is not visualized. No evidence of pulmonic stenosis. Aorta: The aortic root is normal in size and structure. IAS/Shunts: No atrial level shunt detected by color flow Doppler.  LEFT VENTRICLE PLAX 2D LVIDd:         3.40 cm      Diastology LVIDs:         2.10 cm      LV e' medial:    9.52 cm/s LV PW:         0.90 cm      LV E/e' medial:  12.3 LV IVS:        1.20 cm      LV e' lateral:   8.97 cm/s LVOT diam:     1.80 cm      LV E/e' lateral: 13.0 LV SV:         84 LV SV Index:   38 LVOT  Area:     2.54 cm  LV Volumes (MOD) LV vol d, MOD A2C: 95.4 ml LV vol d, MOD A4C: 128.0 ml LV vol s, MOD A2C: 34.0 ml LV vol s, MOD A4C: 43.9 ml LV SV MOD A2C:     61.4 ml LV SV MOD A4C:     128.0 ml LV SV MOD BP:      72.6 ml RIGHT VENTRICLE RV Basal diam:  4.70 cm RV S prime:     17.20 cm/s TAPSE (M-mode): 2.9 cm LEFT ATRIUM             Index        RIGHT ATRIUM           Index LA diam:        3.90 cm 1.76 cm/m   RA Area:     21.80 cm LA Vol (A2C):   66.0 ml 29.77 ml/m  RA Volume:   72.60 ml  32.74 ml/m LA Vol (A4C):   57.1 ml 25.75 ml/m LA Biplane Vol: 62.2 ml 28.05 ml/m  AORTIC VALVE AV Area (Vmax):    2.43 cm AV Area (Vmean):   2.32 cm AV Area (VTI):     2.40 cm AV Vmax:           161.50 cm/s AV Vmean:          116.500 cm/s AV VTI:            0.349 m AV Peak Grad:      10.4 mmHg AV Mean Grad:      6.0 mmHg LVOT Vmax:         154.50 cm/s LVOT Vmean:        106.000 cm/s LVOT VTI:          0.328 m LVOT/AV VTI ratio: 0.94  AORTA Ao Root diam: 3.60 cm Ao Asc diam:  2.70 cm MITRAL VALVE                TRICUSPID VALVE MV Area (PHT): 2.23 cm     TR Peak grad:   23.6 mmHg MV Area VTI:   2.38 cm     TR Vmax:        243.00 cm/s MV Peak grad:  7.2 mmHg MV Mean grad:  4.0 mmHg     SHUNTS MV Vmax:       1.34 m/s     Systemic VTI:  0.33 m MV Vmean:      91.6 cm/s    Systemic Diam:  1.80 cm MV Decel Time: 340 msec MV E velocity: 117.00 cm/s MV A velocity: 135.00 cm/s MV E/A ratio:  0.87 Arvilla Meres MD Electronically signed by Arvilla Meres MD Signature Date/Time: 07/05/2022/6:06:12 PM    Final       Linkon Siverson T. Hae Ahlers [facility]  If 7PM-7AM, please contact night-coverage [email or web address] 07/06/2022, 2:47 PM

## 2022-07-06 NOTE — Telephone Encounter (Signed)
I'll go check on him

## 2022-07-06 NOTE — Progress Notes (Signed)
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 Dx of Acute on chronic respiratory failure with hypoxia due to COPD exacerbation and possible ILD    PT Comments    Pt tolerated increased ambulation distance of 46' with RW, however SpO2 dropped to 79% on 4L O2 walking, 94% on 3L at rest. Pt reported feeling lightheaded while walking. Encouraged pt to decrease velocity when walking and reviewed pursed lip breathing technique. Instructed pt in seated BUE/LE strengthening exercises. Pt stated he doesn't feel he can manage at home, now recommending ST-SNF for rehab.     Recommendations for follow up therapy are one component of a multi-disciplinary discharge planning process, led by the attending physician.  Recommendations may be updated based on patient status, additional functional criteria and insurance authorization.  Follow Up Recommendations  Can patient physically be transported by private vehicle: Yes    Assistance Recommended at Discharge Intermittent Supervision/Assistance  Patient can return home with the following A little help with bathing/dressing/bathroom;Assistance with cooking/housework;Assist for transportation;Help with stairs or ramp for entrance   Equipment Recommendations  Rollator (4 wheels)    Recommendations for Other Services       Precautions / Restrictions Precautions Precautions: Fall Precaution Comments: denies falls in the past 6 months; monitor O2, 3L at baseline Restrictions Weight Bearing Restrictions: No     Mobility  Bed Mobility               General bed mobility comments: up in recliner     Transfers Overall transfer level: Needs assistance Equipment used: Rolling walker (2 wheels) Transfers: Sit to/from Stand Sit to Stand: Supervision           General transfer comment: VCs hand placement    Ambulation/Gait Ambulation/Gait assistance: Min guard Gait Distance (Feet): 70 Feet Assistive device: Rolling walker (2 wheels) Gait Pattern/deviations: Step-through pattern, Decreased stride length Gait velocity: WFL     General Gait Details: VCs for pursed lip breathing and to decrease speed of walking for energy conservation, SpO2 79% on 4L O2 walking, 94% on 3L at rest, 4/4 dyspnea with walking   Stairs             Wheelchair Mobility    Modified Rankin (Stroke Patients Only)       Balance Overall balance assessment: Modified Independent                                          Cognition Arousal/Alertness: Awake/alert Behavior During Therapy: WFL for tasks assessed/performed Overall Cognitive Status: Within Functional Limits for tasks assessed                                          Exercises General Exercises - Lower Extremity Long Arc Quad: AROM, Both, 10 reps, Seated Hip Flexion/Marching: AROM, Both, 10 reps, Seated Shoulder Exercises Shoulder Flexion: AROM, Both, 10 reps, Seated    General Comments        Pertinent Vitals/Pain Pain Assessment Pain  Assessment: No/denies pain    Home Living                          Prior Function            PT Goals (current goals can now be found in the care plan section) Acute Rehab PT Goals Patient Stated Goal: walk in the yard, go visit friends PT Goal Formulation: With patient Time For Goal Achievement: 07/19/22 Potential to Achieve Goals: Good Progress towards PT goals: Progressing toward goals    Frequency    Min 1X/week      PT Plan Discharge plan needs to be updated    Co-evaluation              AM-PAC PT "6 Clicks"  Mobility   Outcome Measure  Help needed turning from your back to your side while in a flat bed without using bedrails?: None Help needed moving from lying on your back to sitting on the side of a flat bed without using bedrails?: None Help needed moving to and from a bed to a chair (including a wheelchair)?: A Little Help needed standing up from a chair using your arms (e.g., wheelchair or bedside chair)?: A Little Help needed to walk in hospital room?: A Little Help needed climbing 3-5 steps with a railing? : A Little 6 Click Score: 20    End of Session Equipment Utilized During Treatment: Gait belt;Oxygen Activity Tolerance: Patient limited by fatigue Patient left: with call bell/phone within reach;in chair;with chair alarm set Nurse Communication: Mobility status PT Visit Diagnosis: Difficulty in walking, not elsewhere classified (R26.2)     Time: 1610-9604 PT Time Calculation (min) (ACUTE ONLY): 25 min  Charges:  $Gait Training: 8-22 mins $Therapeutic Exercise: 8-22 mins                     Ralene Bathe Kistler PT 07/06/2022  Acute Rehabilitation Services  Office [phone]

## 2022-07-06 NOTE — Telephone Encounter (Signed)
Patient needs hospital follow-up visit with Dr. Vassie Loll or with APP in 2 to 3 weeks.

## 2022-07-06 NOTE — TOC Progression Note (Signed)
Transition of Care [facility]) - Progression Note    Patient Details  Name: [name] [name] MRN: [id number] Date of Birth: 07-May-1945  Transition of [facility]) CM/SW Contact  Larrie Kass, LCSW Phone Number: 07/06/2022, 2:25 PM  Clinical Narrative:    CSW spoke with pt to  discuss rec change to SNF placement, pt has agreed to placement. CSW explained the process, pt will need insurance auth. CSW to work pt up for SNF placement . TOC to follow.    Expected Discharge Plan: Skilled Nursing Facility Barriers to Discharge: Continued Medical Work up  Expected Discharge Plan and Services       Living arrangements for the past 2 months: Apartment Expected Discharge Date: 07/06/22                         HH Arranged: PT, OT HH Agency: [facility] Date HH Agency Contacted: 07/06/22 Time HH Agency Contacted: 1015 Representative spoke with at [facility] Agency: Amy   Social Determinants of Health (SDOH) Interventions SDOH Screenings   Food Insecurity: No Food Insecurity (07/04/2022)  Housing: High Risk (07/04/2022)  Transportation Needs: No Transportation Needs (07/04/2022)  Utilities: Not At Risk (07/04/2022)  Depression (PHQ2-9): Low Risk  (03/09/2019)  Tobacco Use: Medium Risk (07/04/2022)    Readmission Risk Interventions     No data to display

## 2022-07-06 NOTE — Progress Notes (Signed)
NAME:  [name] [name], MRN:  [id number], DOB:  Jun 02, 1945, LOS: 1 ADMISSION DATE:  07/04/2022, CONSULTATION DATE: 07/04/2022 REFERRING MD: Dr. Alanda Slim, CHIEF COMPLAINT: Shortness of breath, hypoxemia  History of Present Illness:  77 year old man with a former heavy tobacco history who has been followed in our office by Dr. Vassie Loll for COPD and chronic hypoxemic respiratory failure.  Also with a history of hypertension, hyperlipidemia.  He has typically been on 2 L/min continuous at home, 3-4 l/min by POC with marginal compliance. He called our office 6/25 when he was experiencing progressive dyspnea at rest and with exertion.  He also saw exertional desaturations to the 70s on 3 L/min.  He was admitted 07/04/2022 and therapy for COPD exacerbation was initiated: Solu-Medrol x 1, Brovana/Pulmicort, Yupelri, ceftriaxone/azithromycin.     Pertinent  Medical History   Past Medical History:  Diagnosis Date   COPD (chronic obstructive pulmonary disease) (HCC)   Chronic hypoxemic respiratory failure Hyperlipidemia CAD, medically managed Hypertension Depression Chronic edema, chronic left lower extremity wound  Significant Hospital Events: Including procedures, antibiotic start and stop dates in addition to other pertinent events   Pulmonary function testing 10/07/2012 > severe obstruction with positive bronchodilator response, normal lung volumes (consider pseudonormalization from coexisting restriction), decreased diffusion capacity Spirometry 03/18/2018 > severe obstruction, FEV1 1.32 L (44% predicted) Echocardiogram 10/05/2019 > normal LV function, normal RV size and function, CT chest 06/25/2022 >> no adenopathy, centrilobular and paraseptal emphysema, apex predominant, scattered tiny pulmonary nodules stable compared with priors, evolving/progressive posterior right upper lobe opacity consistent with postinflammatory scarring.  No evidence diffuse ILD Admission chest x-ray 07/04/2022 >>  cardiomegaly, emphysematous changes and some prominent interstitial markings.  No consolidation or definite infiltrate  Interim History / Subjective:   He had more cough last night, associated with dyspnea.  His prednisone was changed back to Solu-Medrol 80 mg daily this morning He worked with physical therapy and desaturated at the end of their walk on 4 L/min, SpO2 79% per his report He is concerned about not being ready to go home, has been told that he would be a good candidate for rehab  Objective   Blood pressure (!) 159/68, pulse 92, temperature 98.3 F (36.8 C), temperature source Oral, resp. rate 20, height 5\' 11"  (1.803 m), weight 102.1 kg, SpO2 97 %.        Intake/Output Summary (Last 24 hours) at 07/06/2022 1525 Last data filed at 07/06/2022 1401 Gross per 24 hour  Intake 850 ml  Output 2275 ml  Net -1425 ml   Filed Weights   07/04/22 1039  Weight: 102.1 kg    Examination: General: Overweight gentleman, anxious no distress HENT: Oropharynx clear, strong voice, no stridor Lungs: Very distant, few end expiratory wheezes, mild wheeze on forced expiration Cardiovascular: Distant, regular, no murmur Abdomen: Obese, protuberant, nondistended with positive bowel sounds Extremities: No edema.  He does have some venous stasis changes on his shins Neuro: Awake, appropriate, answers questions, follows commands  Resolved Hospital Problem list     Assessment & Plan:   Acute on chronic hypoxemic respiratory failure Acute exacerbation COPD Posterior right upper lobe scar, interstitial prominence.  Suspect from prior inflammation/pneumonia.  Confirmed on high-res CT chest 6/26 -Continue nebulized long-acting BD therapy: Roxy Manns. -Revert back to Finger at the time of discharge -Continue inhaled steroid, nebulized budesonide -He was changed back to Solu-Medrol.  Will try to get him back to a prednisone taper as soon as feasible -Continue full course azithromycin on  6/27.  Ceftriaxone stopped -He needs a formal oxygen titration walking study before discharge so he knows how much oxygen he has to wear with exertion, physical therapy -Agree with dedicated PT for short course to build his strength and hopefully get him to home. -No evidence of ILD on his CT chest.  More consistent with some focal scar post infection or inflammation in the posterior right upper lobe -He will follow-up with Dr. Vassie Loll as an outpatient.  He may be a good candidate to be on daily azithromycin or even daily low-dose prednisone.  Hypertension -Home amlodipine and furosemide are currently on hold  Depression/anxiety -Continue Zoloft -Avoid medication that will suppress respiratory drive   Labs   CBC: Recent Labs  Lab 07/04/22 1154 07/05/22 0431 07/06/22 0403  WBC 8.7 6.2 9.5  NEUTROABS 6.4  --   --   HGB 14.8 14.2 14.2  HCT 42.7 42.5 41.4  MCV 104.1* 106.5* 105.9*  PLT 166 170 159    Basic Metabolic Panel: Recent Labs  Lab 07/04/22 1154 07/05/22 0431 07/06/22 0403  NA 134* 132* 133*  K 4.3 4.8 3.8  CL 92* 94* 93*  CO2 34* 29 31  GLUCOSE 98 164* 118*  BUN 9 10 13   CREATININE 0.66 0.65 0.71  CALCIUM 9.5 9.0 8.9  MG  --  1.8 2.0  PHOS  --  4.0 3.9   GFR: Estimated Creatinine Clearance: 98.5 mL/min (by C-G formula based on SCr of 0.71 mg/dL). Recent Labs  Lab 07/04/22 1154 07/04/22 1223 07/04/22 1357 07/04/22 1407 07/05/22 0431 07/06/22 0403  PROCALCITON  --   --  <0.10  --  <0.10  --   WBC 8.7  --   --   --  6.2 9.5  LATICACIDVEN  --  1.4  --  1.6  --   --     Liver Function Tests: Recent Labs  Lab 07/04/22 1154 07/05/22 0431 07/06/22 0403  AST 29  --   --   ALT 18  --   --   ALKPHOS 124  --   --   BILITOT 0.9  --   --   PROT 7.3  --   --   ALBUMIN 3.9 3.4* 3.1*   No results for input(s): "LIPASE", "AMYLASE" in the last 168 hours. No results for input(s): "AMMONIA" in the last 168 hours.  ABG    Component Value Date/Time   HCO3  32.5 (H) 07/04/2022 1634   O2SAT 70.6 07/04/2022 1634     Critical care time: NA       Levy Pupa, MD, PhD 07/06/2022, 3:25 PM [facility] [phone] or if no answer before 7:00PM call [phone] For any issues after 7:00PM please call eLink [phone]

## 2022-07-06 NOTE — Telephone Encounter (Signed)
Results/ plans faxed to PCP. Order placed for 3 month nodule f/u CT.  

## 2022-07-07 DIAGNOSIS — I10 Essential (primary) hypertension: Secondary | ICD-10-CM | POA: Diagnosis not present

## 2022-07-07 DIAGNOSIS — F410 Panic disorder [episodic paroxysmal anxiety] without agoraphobia: Secondary | ICD-10-CM | POA: Diagnosis present

## 2022-07-07 DIAGNOSIS — F411 Generalized anxiety disorder: Secondary | ICD-10-CM

## 2022-07-07 DIAGNOSIS — J441 Chronic obstructive pulmonary disease with (acute) exacerbation: Secondary | ICD-10-CM | POA: Diagnosis not present

## 2022-07-07 DIAGNOSIS — J9621 Acute and chronic respiratory failure with hypoxia: Secondary | ICD-10-CM | POA: Diagnosis not present

## 2022-07-07 HISTORY — DX: Generalized anxiety disorder: F41.1

## 2022-07-07 LAB — RENAL FUNCTION PANEL
Albumin: 3.2 g/dL — ABNORMAL LOW (ref 3.5–5.0)
Anion gap: 7 (ref 5–15)
BUN: 15 mg/dL (ref 8–23)
CO2: 32 mmol/L (ref 22–32)
Calcium: 8.9 mg/dL (ref 8.9–10.3)
Chloride: 93 mmol/L — ABNORMAL LOW (ref 98–111)
Creatinine, Ser: 0.65 mg/dL (ref 0.61–1.24)
GFR, Estimated: >60 mL/min (ref >60)
Glucose, Bld: 136 mg/dL — ABNORMAL HIGH (ref 70–99)
Phosphorus: 4.4 mg/dL (ref 2.5–4.6)
Potassium: 4.2 mmol/L (ref 3.5–5.1)
Sodium: 132 mmol/L — ABNORMAL LOW (ref 135–145)

## 2022-07-07 LAB — CBC
HCT: 45.2 % (ref 39.0–52.0)
Hemoglobin: 15.4 g/dL (ref 13.0–17.0)
MCH: 36 pg — ABNORMAL HIGH (ref 26.0–34.0)
MCHC: 34.1 g/dL (ref 30.0–36.0)
MCV: 105.6 fL — ABNORMAL HIGH (ref 80.0–100.0)
Platelets: 173 10*3/uL (ref 150–400)
RBC: 4.28 MIL/uL (ref 4.22–5.81)
RDW: 13.3 % (ref 11.5–15.5)
WBC: 9.9 10*3/uL (ref 4.0–10.5)
nRBC: 0 % (ref 0.0–0.2)

## 2022-07-07 LAB — MAGNESIUM: Magnesium: 2.1 mg/dL (ref 1.7–2.4)

## 2022-07-07 MED ORDER — LISINOPRIL 20 MG PO TABS
20.0000 mg | ORAL_TABLET | Freq: Every day | ORAL | Status: DC
  Administered 2022-07-08 – 2022-07-09 (×2): 20 mg via ORAL
  Filled 2022-07-07 (×2): qty 1

## 2022-07-07 MED ORDER — HYDROXYZINE HCL 25 MG PO TABS
25.0000 mg | ORAL_TABLET | Freq: Three times a day (TID) | ORAL | Status: DC | PRN
  Administered 2022-07-08 – 2022-07-09 (×2): 25 mg via ORAL
  Filled 2022-07-07 (×2): qty 1

## 2022-07-07 MED ORDER — ALPRAZOLAM 0.25 MG PO TABS: 0.2500 mg | ORAL_TABLET | Freq: Three times a day (TID) | ORAL | Status: DC | PRN

## 2022-07-07 MED ORDER — HYDRALAZINE HCL 20 MG/ML IJ SOLN: 10.0000 mg | Freq: Four times a day (QID) | INTRAMUSCULAR | Status: DC | PRN

## 2022-07-07 MED ORDER — PREDNISONE 20 MG PO TABS
60.0000 mg | ORAL_TABLET | Freq: Every day | ORAL | Status: DC
  Administered 2022-07-08 – 2022-07-09 (×2): 60 mg via ORAL
  Filled 2022-07-07 (×2): qty 3

## 2022-07-07 NOTE — Progress Notes (Signed)
PROGRESS NOTE   [name] [name]  ZOX:[id number] DOB: 03/05/1945 DOA: 07/04/2022 PCP: Chilton Greathouse, MD   Date of Service: the patient was seen and examined on 07/07/2022  Brief Narrative:  77 year old M with PMH of COPD/chronic hypoxic RF on 3 L, coronary artery calcification, HTN, HLD, anxiety, alcohol use and tobacco use (quit about 15 years ago) who presented to [facility] ED with progressive shortness of breath felt to be suffering from COPD exacerbation.   Assessment and Plan: * Acute on chronic respiratory failure with hypoxia (HCC) Slow clinical improvement Patient has been weaned back down to 3 L of oxygen with rest.  Patient is typically on 3 L of oxygen at home.  Patient is still exhibiting substantial hypoxia with any physical exertion. Transition patient to prednisone from Solu-Medrol today Case discussed with Dr. Katrinka Blazing with pulmonology who will sign off. Patient will need repeat assessment of oxygen requirements prior to discharge. Bouts of anxiety may be slowing patient's recovery, will treat patient with as needed hydroxyzine  COPD with acute exacerbation (HCC) Please see assessment and plan above  Essential hypertension Placing patient on lisinopril 20 mg daily As needed intravenous hydralazine for markedly elevated blood pressure.  Generalized anxiety disorder with panic attacks Poorly as anxiety may be hindering patient's recovery Continuing home regimen of sertraline As needed hydroxyzine for bouts of anxiety, and as needed benzodiazepine if this is unsuccessful.  Alcohol abuse No evidence of withdrawal at this time. Counseling on cessation    Subjective:  Patient continues to complain of shortness of breath, moderate to severe in intensity, associated with wheezing, worse with exertion and improved with rest.  Physical Exam:  Vitals:   07/07/22 1336 07/07/22 2011 07/07/22 2013 07/07/22 2014  BP: 110/61   (!) 152/74  Pulse: 91   85  Resp: (!) 22   18   Temp: 97.6 F (36.4 C)     TempSrc: Oral     SpO2: 94% 98% 98% 97%  Weight:      Height:        Constitutional: Awake alert and oriented x3, no associated distress.   Skin: no rashes, no lesions, good skin turgor noted. Eyes: Pupils are equally reactive to light.  No evidence of scleral icterus or conjunctival pallor.  ENMT: Moist mucous membranes noted.  Posterior pharynx clear of any exudate or lesions.   Respiratory: Notable significant expiratory wheezing primarily in the right lung fields, no crackles. Normal respiratory effort. No accessory muscle use.  Cardiovascular: Regular rate and rhythm, no murmurs / rubs / gallops. No extremity edema. 2+ pedal pulses. No carotid bruits.  Abdomen: Abdomen is soft and nontender.  No evidence of intra-abdominal masses.  Positive bowel sounds noted in all quadrants.   Musculoskeletal: No joint deformity upper and lower extremities. Good ROM, no contractures. Normal muscle tone.    Data Reviewed:  I have personally reviewed and interpreted labs, imaging.  Significant findings are   CBC: Recent Labs  Lab 07/04/22 1154 07/05/22 0431 07/06/22 0403 07/07/22 0516  WBC 8.7 6.2 9.5 9.9  NEUTROABS 6.4  --   --   --   HGB 14.8 14.2 14.2 15.4  HCT 42.7 42.5 41.4 45.2  MCV 104.1* 106.5* 105.9* 105.6*  PLT 166 170 159 173   Basic Metabolic Panel: Recent Labs  Lab 07/04/22 1154 07/05/22 0431 07/06/22 0403 07/07/22 0516  NA 134* 132* 133* 132*  K 4.3 4.8 3.8 4.2  CL 92* 94* 93* 93*  CO2 34* 29 31  32  GLUCOSE 98 164* 118* 136*  BUN 9 10 13 15   CREATININE 0.66 0.65 0.71 0.65  CALCIUM 9.5 9.0 8.9 8.9  MG  --  1.8 2.0 2.1  PHOS  --  4.0 3.9 4.4   GFR: Estimated Creatinine Clearance: 98.5 mL/min (by C-G formula based on SCr of 0.65 mg/dL). Liver Function Tests: Recent Labs  Lab 07/04/22 1154 07/05/22 0431 07/06/22 0403 07/07/22 0516  AST 29  --   --   --   ALT 18  --   --   --   ALKPHOS 124  --   --   --   BILITOT 0.9  --    --   --   PROT 7.3  --   --   --   ALBUMIN 3.9 3.4* 3.1* 3.2*      Code Status:  Full code.  Code status decision has been confirmed with: patient    Severity of Illness:  The appropriate patient status for this patient is INPATIENT. Inpatient status is judged to be reasonable and necessary in order to provide the required intensity of service to ensure the patient's safety. The patient's presenting symptoms, physical exam findings, and initial radiographic and laboratory data in the context of their chronic comorbidities is felt to place them at high risk for further clinical deterioration. Furthermore, it is not anticipated that the patient will be medically stable for discharge from the hospital within 2 midnights of admission.   * I certify that at the point of admission it is my clinical judgment that the patient will require inpatient hospital care spanning beyond 2 midnights from the point of admission due to high intensity of service, high risk for further deterioration and high frequency of surveillance required.*  Time spent:  50 minutes  Author:  Marinda Elk MD  07/07/2022 10:09 PM

## 2022-07-07 NOTE — Progress Notes (Signed)
Occupational Therapy Treatment Patient Details Name: [name] [name] MRN: [id number] DOB: February 02, 1945 Today's Date: 07/07/2022   History of present illness 77 year old M with PMH of COPD/chronic hypoxic RF on 3 L, coronary artery calcification, HTN, HLD, anxiety, alcohol use and tobacco use (quit about 15 years ago) who presented to [facility] ED with progressive shortness of breath for about 2 weeks. Dx of Acute on chronic respiratory failure with hypoxia due to COPD exacerbation and possible ILD   OT comments  Patient progressing and verbalized improved understanding re: the concepts of energy conservation and uses of adaptive equipment to support these strategies in order to avoid over fatigue and rehospitalization.  Patient remains limited by  generalized weakness and decreased activity tolerance with desaturation to 74% on 3L after ambulating about 24' in room but with good recovery to 92% with seated rest break and cues for PLB. Pt continues to demonstrate good rehab potential and would benefit from continued skilled OT to increase safety and independence with ADLs and functional transfers to allow pt to return home safely and reduce caregiver burden and fall risk.    Recommendations for follow up therapy are one component of a multi-disciplinary discharge planning process, led by the attending physician.  Recommendations may be updated based on patient status, additional functional criteria and insurance authorization.    Assistance Recommended at Discharge Intermittent Supervision/Assistance  Patient can return home with the following  Assistance with cooking/housework;Assist for transportation;Help with stairs or ramp for entrance   Equipment Recommendations  None recommended by OT    Recommendations for Other Services      Precautions / Restrictions Precautions Precautions: Fall Precaution Comments: denies falls in the past 6 months; monitor O2, 3L at baseline-Mon  O2 Restrictions Weight Bearing Restrictions: No       Mobility Bed Mobility               General bed mobility comments: up in recliner    Transfers                         Balance Overall balance assessment: Modified Independent (Pt still using RW. Discussed rollator for energy conservation.)                                         ADL either performed or assessed with clinical judgement   ADL Overall ADL's : Needs assistance/impaired             Lower Body Bathing:  (Pt shown video of compression sock aid and handout provided. Discussed use and relation to E.C concepts)           Toilet Transfer: Supervision/safety (Pt stood from recliner to RW with supervision and ambulated i nroom with cues for RW proximityy and posture.)             General ADL Comments: Pt educated on energy conservation as it relates to BADLs and IADLs. Discussed AE uses and avoidance of unneceeary movements or effort as well as PLB, pacing and the "4 P's". Pt verbalized understanding but would likely benefit from reinforcement.    Extremity/Trunk Assessment Upper Extremity Assessment Upper Extremity Assessment: Overall WFL for tasks assessed   Lower Extremity Assessment Lower Extremity Assessment: Defer to PT evaluation   Cervical / Trunk Assessment Cervical / Trunk Assessment: Normal    Vision Baseline Vision/History: 1 Wears glasses  Ability to See in Adequate Light: 0 Adequate Patient Visual Report: No change from baseline     Perception     Praxis      Cognition Arousal/Alertness: Awake/alert Behavior During Therapy: Anxious, WFL for tasks assessed/performed Overall Cognitive Status: Within Functional Limits for tasks assessed                                          Exercises      Shoulder Instructions       General Comments      Pertinent Vitals/ Pain       Pain Assessment Pain Assessment: No/denies  pain  Home Living                                          Prior Functioning/Environment              Frequency  Min 1X/week        Progress Toward Goals  OT Goals(current goals can now be found in the care plan section)  Progress towards OT goals: Progressing toward goals  Acute Rehab OT Goals OT Goal Formulation: With patient Time For Goal Achievement: 07/19/22 Potential to Achieve Goals: Good  Plan Discharge plan needs to be updated    Co-evaluation                 AM-PAC OT "6 Clicks" Daily Activity     Outcome Measure   Help from another person eating meals?: None Help from another person taking care of personal grooming?: A Little Help from another person toileting, which includes using toliet, bedpan, or urinal?: A Little Help from another person bathing (including washing, rinsing, drying)?: A Little Help from another person to put on and taking off regular upper body clothing?: A Little Help from another person to put on and taking off regular lower body clothing?: A Little 6 Click Score: 19    End of Session Equipment Utilized During Treatment: Rolling walker (2 wheels);Gait belt;Oxygen  OT Visit Diagnosis: Unsteadiness on feet (R26.81);Other (comment)   Activity Tolerance Patient tolerated treatment well;Patient limited by fatigue   Patient Left in chair;with call bell/phone within reach;with chair alarm set   Nurse Communication Other (comment) (Changes to SpO2)        Time: 8119-1478 OT Time Calculation (min): 25 min  Charges: OT General Charges $OT Visit: 1 Visit OT Treatments $Self Care/Home Management : 8-22 mins $Therapeutic Activity: 8-22 mins  Victorino Dike, OT Acute Rehab Services Office: [phone] 07/07/2022  Theodoro Clock 07/07/2022, 11:48 AM

## 2022-07-07 NOTE — Assessment & Plan Note (Signed)
anxiety may be hindering patient's recovery Continuing home regimen of sertraline As needed hydroxyzine for bouts of anxiety

## 2022-07-07 NOTE — Assessment & Plan Note (Signed)
No evidence of withdrawal at this time. Counseling on cessation

## 2022-07-07 NOTE — Assessment & Plan Note (Signed)
Placing patient on lisinopril 20 mg daily As needed intravenous hydralazine for markedly elevated blood pressure.

## 2022-07-07 NOTE — Assessment & Plan Note (Signed)
·   Please see assessment and plan above °

## 2022-07-07 NOTE — Hospital Course (Signed)
77 year old M with PMH of COPD/chronic hypoxic RF on 3 L, coronary artery calcification, HTN, HLD, anxiety, alcohol use and tobacco use (quit about 15 years ago) who presented to [facility] ED with progressive shortness of breath felt to be suffering from COPD exacerbation.  Patient was admitted to the hospital service.  Patient was placed on aggressive bronchodilator therapy as well as intravenous systemic steroids.  Patient was also placed on antibiotics for suspicion of concurrent acute bacterial bronchitis.  In the days that followed, patient's oxygen requirements slowly improved and patient was only requiring 3 L of oxygen at rest per his baseline.  Patient was eventually transitioned from intravenous steroids to oral prednisone to initiate a prolonged taper.  Repeat oxygen ambulatory assessment prior to discharge revealed the patient needs at least 4 L of oxygen via nasal cannula with exertion.  Hospital course was complicated by bouts of significant anxiety.  Patient has historically used alprazolam in the past however a trial of hydroxyzine was used during this hospitalization with good effect.  Hospital course was also complicated by constipation which was managed with initiation of laxatives.  Patient was evaluated by physical therapy during this hospitalization and it was felt the patient would benefit from skilled physical therapy services in a skilled nursing facility.  Arrangements were made for the patient to be discharged [facility] on 07/09/2022 in improved and stable condition.

## 2022-07-07 NOTE — Assessment & Plan Note (Addendum)
Slow clinical improvement Patient has been weaned back down to 3 L of oxygen with rest.  Patient is typically on 3 L of oxygen at home.  Patient is still exhibiting substantial hypoxia with any physical exertion. Transition patient to prednisone from Solu-Medrol today Case discussed with Dr. Katrinka Blazing with pulmonology who will sign off. Patient will need repeat assessment of oxygen requirements prior to discharge. Bouts of anxiety may be slowing patient's recovery, will treat patient with as needed hydroxyzine

## 2022-07-08 DIAGNOSIS — J9621 Acute and chronic respiratory failure with hypoxia: Secondary | ICD-10-CM | POA: Diagnosis not present

## 2022-07-08 DIAGNOSIS — K5900 Constipation, unspecified: Secondary | ICD-10-CM | POA: Diagnosis present

## 2022-07-08 DIAGNOSIS — J441 Chronic obstructive pulmonary disease with (acute) exacerbation: Secondary | ICD-10-CM | POA: Diagnosis not present

## 2022-07-08 DIAGNOSIS — K219 Gastro-esophageal reflux disease without esophagitis: Secondary | ICD-10-CM | POA: Diagnosis present

## 2022-07-08 DIAGNOSIS — I10 Essential (primary) hypertension: Secondary | ICD-10-CM | POA: Diagnosis not present

## 2022-07-08 DIAGNOSIS — F411 Generalized anxiety disorder: Secondary | ICD-10-CM | POA: Diagnosis not present

## 2022-07-08 DIAGNOSIS — K5909 Other constipation: Secondary | ICD-10-CM

## 2022-07-08 LAB — CBC WITH DIFFERENTIAL/PLATELET
Abs Immature Granulocytes: 0.06 10*3/uL (ref 0.00–0.07)
Basophils Absolute: 0 10*3/uL (ref 0.0–0.1)
Basophils Relative: 0 %
Eosinophils Absolute: 0 10*3/uL (ref 0.0–0.5)
Eosinophils Relative: 0 %
HCT: 46 % (ref 39.0–52.0)
Hemoglobin: 15.4 g/dL (ref 13.0–17.0)
Immature Granulocytes: 1 %
Lymphocytes Relative: 12 %
Lymphs Abs: 1.4 10*3/uL (ref 0.7–4.0)
MCH: 35.3 pg — ABNORMAL HIGH (ref 26.0–34.0)
MCHC: 33.5 g/dL (ref 30.0–36.0)
MCV: 105.5 fL — ABNORMAL HIGH (ref 80.0–100.0)
Monocytes Absolute: 1 10*3/uL (ref 0.1–1.0)
Monocytes Relative: 9 %
Neutro Abs: 8.6 10*3/uL — ABNORMAL HIGH (ref 1.7–7.7)
Neutrophils Relative %: 78 %
Platelets: 175 10*3/uL (ref 150–400)
RBC: 4.36 MIL/uL (ref 4.22–5.81)
RDW: 13.2 % (ref 11.5–15.5)
WBC: 11.1 10*3/uL — ABNORMAL HIGH (ref 4.0–10.5)
nRBC: 0 % (ref 0.0–0.2)

## 2022-07-08 LAB — COMPREHENSIVE METABOLIC PANEL
ALT: 36 U/L (ref 0–44)
AST: 31 U/L (ref 15–41)
Albumin: 3.3 g/dL — ABNORMAL LOW (ref 3.5–5.0)
Alkaline Phosphatase: 97 U/L (ref 38–126)
Anion gap: 8 (ref 5–15)
BUN: 20 mg/dL (ref 8–23)
CO2: 33 mmol/L — ABNORMAL HIGH (ref 22–32)
Calcium: 9 mg/dL (ref 8.9–10.3)
Chloride: 92 mmol/L — ABNORMAL LOW (ref 98–111)
Creatinine, Ser: 0.78 mg/dL (ref 0.61–1.24)
GFR, Estimated: >60 mL/min (ref >60)
Glucose, Bld: 113 mg/dL — ABNORMAL HIGH (ref 70–99)
Potassium: 4.1 mmol/L (ref 3.5–5.1)
Sodium: 133 mmol/L — ABNORMAL LOW (ref 135–145)
Total Bilirubin: 0.7 mg/dL (ref 0.3–1.2)
Total Protein: 6.9 g/dL (ref 6.5–8.1)

## 2022-07-08 LAB — MAGNESIUM: Magnesium: 2.2 mg/dL (ref 1.7–2.4)

## 2022-07-08 MED ORDER — POLYETHYLENE GLYCOL 3350 17 G PO PACK
17.0000 g | PACK | Freq: Every day | ORAL | Status: DC
  Administered 2022-07-09: 17 g via ORAL
  Filled 2022-07-08: qty 1

## 2022-07-08 MED ORDER — SENNA 8.6 MG PO TABS
2.0000 | ORAL_TABLET | Freq: Every day | ORAL | Status: DC
  Administered 2022-07-08: 17.2 mg via ORAL
  Filled 2022-07-08: qty 2

## 2022-07-08 MED ORDER — FLEET ENEMA 7-19 GM/118ML RE ENEM
1.0000 | ENEMA | Freq: Once | RECTAL | Status: AC
  Administered 2022-07-08: 1 via RECTAL
  Filled 2022-07-08: qty 1

## 2022-07-08 MED ORDER — POLYETHYLENE GLYCOL 3350 17 G PO PACK
17.0000 g | PACK | Freq: Every day | ORAL | Status: DC | PRN
  Administered 2022-07-08: 17 g via ORAL
  Filled 2022-07-08: qty 1

## 2022-07-08 MED ORDER — PANTOPRAZOLE SODIUM 20 MG PO TBEC
20.0000 mg | DELAYED_RELEASE_TABLET | Freq: Once | ORAL | Status: AC
  Administered 2022-07-08: 20 mg via ORAL
  Filled 2022-07-08: qty 1

## 2022-07-08 NOTE — [place] FL2 (Signed)
[place] MEDICAID FL2 LEVEL OF CARE FORM     IDENTIFICATION  Patient Name: [name] [name] [facility] Birthdate: 03-31-1945 Sex: male Admission Date (Current Location): 07/04/2022  [facility] and [place] Number:  Producer, television/film/video and Address:  [facility],  [address] [place], [place] [address]      Provider Number: [id number]  Attending Physician Name and Address:  Marinda Elk, MD  Relative Name and Phone Number:  daughter, Jeanmichael Holway @ [phone]    Current Level of Care: Hospital Recommended Level of Care: Skilled Nursing Facility Prior Approval Number:    Date Approved/Denied:   PASRR Number: [id number]  Discharge Plan: SNF    Current Diagnoses: Patient Active Problem List   Diagnosis Date Noted   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   Shortness of breath 07/06/2022   Acute on chronic respiratory failure with hypoxia (HCC) 07/04/2022   Alcohol abuse 07/04/2022   Macrocytosis 07/04/2022   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   Swelling of lower extremity 06/24/2020   Former smoker 09/04/2019   Coronary artery calcification seen on CT scan 08/12/2019   Essential hypertension 08/12/2019   Hyperlipidemia 08/12/2019   Chronic respiratory failure with hypoxia (HCC) 03/30/2019   COPD with acute exacerbation (HCC) 03/18/2018    Orientation RESPIRATION BLADDER Height & Weight     Self, Time, Situation, Place  O2 Continent Weight: 225 lb 1.4 oz (102.1 kg) Height:  5\' 11"  (180.3 cm)  BEHAVIORAL SYMPTOMS/MOOD NEUROLOGICAL BOWEL NUTRITION STATUS      Continent    AMBULATORY STATUS COMMUNICATION OF NEEDS Skin   Limited Assist Verbally Normal                       Personal Care Assistance Level of Assistance  Bathing, Dressing Bathing Assistance: Limited assistance   Dressing Assistance: Limited assistance     Functional Limitations Info  Sight, Hearing, Speech Sight Info: Adequate Hearing Info:  Adequate Speech Info: Adequate    SPECIAL CARE FACTORS FREQUENCY  PT (By licensed PT), OT (By licensed OT)     PT Frequency: 5x/wk OT Frequency: 5x/wk            Contractures Contractures Info: Not present    Additional Factors Info  Code Status, Allergies, Psychotropic Code Status Info: Full Allergies Info: Fluticasone-salmeterol Psychotropic Info: see MAR         Current Medications (07/08/2022):  This is the current hospital active medication list Current Facility-Administered Medications  Medication Dose Route Frequency Provider Last Rate Last Admin   ALPRAZolam Prudy Feeler) tablet 0.25 mg  0.25 mg Oral TID PRN Shalhoub, Deno Lunger, MD       arformoterol (BROVANA) nebulizer solution 15 mcg  15 mcg Nebulization BID Candelaria Stagers T, MD   15 mcg at 07/08/22 0809   aspirin EC tablet 81 mg  81 mg Oral Daily Candelaria Stagers T, MD   81 mg at 07/07/22 0848   atorvastatin (LIPITOR) tablet 20 mg  20 mg Oral Daily Gonfa, Taye T, MD   20 mg at 07/08/22 0935   budesonide (PULMICORT) nebulizer solution 0.5 mg  0.5 mg Nebulization BID Candelaria Stagers T, MD   0.5 mg at 07/08/22 0809   cyanocobalamin (VITAMIN B12) tablet 1,000 mcg  1,000 mcg Oral Daily Candelaria Stagers T, MD   1,000 mcg at 07/08/22 0936   enoxaparin (LOVENOX) injection 40 mg  40 mg Subcutaneous Q24H Candelaria Stagers T, MD   40 mg at 07/07/22 1801  furosemide (LASIX) tablet 40 mg  40 mg Oral Daily Gonfa, Taye T, MD   40 mg at 07/08/22 0936   guaiFENesin (ROBITUSSIN) 100 MG/5ML liquid 5 mL  5 mL Oral Q4H PRN Candelaria Stagers T, MD   5 mL at 07/06/22 0024   hydrALAZINE (APRESOLINE) injection 10 mg  10 mg Intravenous Q6H PRN Shalhoub, Deno Lunger, MD       hydrOXYzine (ATARAX) tablet 25 mg  25 mg Oral TID PRN Marinda Elk, MD   25 mg at 07/08/22 0946   ipratropium-albuterol (DUONEB) 0.5-2.5 (3) MG/3ML nebulizer solution 3 mL  3 mL Nebulization Q4H PRN Candelaria Stagers T, MD   3 mL at 07/07/22 0619   lisinopril (ZESTRIL) tablet 20 mg  20 mg Oral Daily  Shalhoub, Deno Lunger, MD   20 mg at 07/08/22 1610   multivitamin with minerals tablet 1 tablet  1 tablet Oral Q breakfast Candelaria Stagers T, MD   1 tablet at 07/08/22 0934   Oral care mouth rinse  15 mL Mouth Rinse PRN Gonfa, Taye T, MD       potassium chloride SA (KLOR-CON M) CR tablet 20 mEq  20 mEq Oral Daily Candelaria Stagers T, MD   20 mEq at 07/08/22 0935   predniSONE (DELTASONE) tablet 60 mg  60 mg Oral Q breakfast Shalhoub, Deno Lunger, MD   60 mg at 07/08/22 0935   protein supplement (ENSURE MAX) liquid  11 oz Oral Daily Candelaria Stagers T, MD   11 oz at 07/06/22 1401   revefenacin (YUPELRI) nebulizer solution 175 mcg  175 mcg Nebulization Daily Candelaria Stagers T, MD   175 mcg at 07/08/22 0809   sertraline (ZOLOFT) tablet 100 mg  100 mg Oral Daily Candelaria Stagers T, MD   100 mg at 07/08/22 9604     Discharge Medications: Please see discharge summary for a list of discharge medications.  Relevant Imaging Results:  Relevant Lab Results:   Additional Information SS# [id number]  Amada Jupiter, LCSW

## 2022-07-08 NOTE — Progress Notes (Signed)
PROGRESS NOTE   [name] [name]  ZOX:[id number] DOB: 1945/08/09 DOA: 07/04/2022 PCP: Chilton Greathouse, MD   Date of Service: the patient was seen and examined on 07/08/2022  Brief Narrative:  77 year old M with PMH of COPD/chronic hypoxic RF on 3 L, coronary artery calcification, HTN, HLD, anxiety, alcohol use and tobacco use (quit about 15 years ago) who presented to [facility] ED with progressive shortness of breath felt to be suffering from COPD exacerbation.   Assessment and Plan: * Acute on chronic respiratory failure with hypoxia (HCC) Continued slow clinical improvement Patient has been weaned back down to 3 L of oxygen with rest.   Ambulatory oxygen assessment reveals that patient requires 4 L oxygen with exertion.  However patient's experiencing substantial shortness of breath with any physical exertion. Patient was transition to prednisone on 6/29. Case discussed with Dr. Katrinka Blazing with pulmonology 6/29 who signed off. Bouts of anxiety may be slowing patient's recovery, treating with as needed hydroxyzine  COPD with acute exacerbation (HCC) Treating concurrent COPD exacerbation with daily steroid therapy as well as aggressive bronchodilator therapy Short course of azithromycin was also provided for possible concurrent acute bacterial bronchitis which has been completed. Remainder of assessment and plan as above  Essential hypertension Blood pressures mostly at target on lisinopril 20 mg daily As needed intravenous hydralazine for markedly elevated blood pressure.  Generalized anxiety disorder with panic attacks anxiety may be hindering patient's recovery Continuing home regimen of sertraline As needed hydroxyzine for bouts of anxiety  Alcohol abuse No evidence of withdrawal at this time. Counseling on cessation  Constipation Patient suffering from substantial constipation as of late Fleet enema administered today resulting in good bowel movement Will place patient on  baseline bowel regimen of MiraLAX and senna  GERD without esophagitis Continuing  daily PPI therapy.    Subjective:  Patient complaining of continued shortness of breath, moderate intensity, worse with exertion and improved with rest.  Patient states that his symptoms are improving.  Patient also complaining of constipation without abdominal pain.  Physical Exam:  Vitals:   07/08/22 0809 07/08/22 1355 07/08/22 1926 07/08/22 2010  BP:  (!) 138/90  112/60  Pulse:  81  90  Resp:  18  16  Temp:  97.8 F (36.6 C)  97.7 F (36.5 C)  TempSrc:  Oral  Oral  SpO2: 98% 94% 100% 96%  Weight:      Height:        Constitutional: Awake alert and oriented x3, no associated distress.   Skin: no rashes, no lesions, good skin turgor noted. Eyes: Pupils are equally reactive to light.  No evidence of scleral icterus or conjunctival pallor.  ENMT: Moist mucous membranes noted.  Posterior pharynx clear of any exudate or lesions.   Respiratory: Improved wheezing compared to yesterday with minimal wheezing in the right lung fields.  No evidence of rales.  Normal respiratory effort. No accessory muscle use.  Cardiovascular: Regular rate and rhythm, no murmurs / rubs / gallops. No extremity edema. 2+ pedal pulses. No carotid bruits.  Abdomen: Abdomen is soft and nontender.  No evidence of intra-abdominal masses.  Positive bowel sounds noted in all quadrants.   Musculoskeletal: No joint deformity upper and lower extremities. Good ROM, no contractures. Normal muscle tone.    Data Reviewed:  I have personally reviewed and interpreted labs, imaging.  Significant findings are   CBC: Recent Labs  Lab 07/04/22 1154 07/05/22 0431 07/06/22 0403 07/07/22 0516 07/08/22 0548  WBC 8.7 6.2 9.5  9.9 11.1*  NEUTROABS 6.4  --   --   --  8.6*  HGB 14.8 14.2 14.2 15.4 15.4  HCT 42.7 42.5 41.4 45.2 46.0  MCV 104.1* 106.5* 105.9* 105.6* 105.5*  PLT 166 170 159 173 175   Basic Metabolic Panel: Recent Labs   Lab 07/04/22 1154 07/05/22 0431 07/06/22 0403 07/07/22 0516 07/08/22 0548  NA 134* 132* 133* 132* 133*  K 4.3 4.8 3.8 4.2 4.1  CL 92* 94* 93* 93* 92*  CO2 34* 29 31 32 33*  GLUCOSE 98 164* 118* 136* 113*  BUN 9 10 13 15 20   CREATININE 0.66 0.65 0.71 0.65 0.78  CALCIUM 9.5 9.0 8.9 8.9 9.0  MG  --  1.8 2.0 2.1 2.2  PHOS  --  4.0 3.9 4.4  --    GFR: Estimated Creatinine Clearance: 98.5 mL/min (by C-G formula based on SCr of 0.78 mg/dL). Liver Function Tests: Recent Labs  Lab 07/04/22 1154 07/05/22 0431 07/06/22 0403 07/07/22 0516 07/08/22 0548  AST 29  --   --   --  31  ALT 18  --   --   --  36  ALKPHOS 124  --   --   --  97  BILITOT 0.9  --   --   --  0.7  PROT 7.3  --   --   --  6.9  ALBUMIN 3.9 3.4* 3.1* 3.2* 3.3*     Code Status:  Full code.  Code status decision has been confirmed with: patient    Severity of Illness:  The appropriate patient status for this patient is INPATIENT. Inpatient status is judged to be reasonable and necessary in order to provide the required intensity of service to ensure the patient's safety. The patient's presenting symptoms, physical exam findings, and initial radiographic and laboratory data in the context of their chronic comorbidities is felt to place them at high risk for further clinical deterioration. Furthermore, it is not anticipated that the patient will be medically stable for discharge from the hospital within 2 midnights of admission.   * I certify that at the point of admission it is my clinical judgment that the patient will require inpatient hospital care spanning beyond 2 midnights from the point of admission due to high intensity of service, high risk for further deterioration and high frequency of surveillance required.*  Time spent:  54 minutes  Author:  Marinda Elk MD  07/08/2022 8:29 PM

## 2022-07-08 NOTE — Assessment & Plan Note (Signed)
Patient suffering from substantial constipation as of late Fleet enema administered today resulting in good bowel movement Will place patient on baseline bowel regimen of MiraLAX and senna

## 2022-07-08 NOTE — Plan of Care (Signed)
  Problem: Education: Goal: Knowledge of General Education information will improve Description: Including pain rating scale, medication(s)/side effects and non-pharmacologic comfort measures Outcome: Progressing   Problem: Health Behavior/Discharge Planning: Goal: Ability to manage health-related needs will improve Outcome: Progressing   Problem: Clinical Measurements: Goal: Ability to maintain clinical measurements within normal limits will improve Outcome: Progressing Goal: Will remain free from infection Outcome: Progressing Goal: Diagnostic test results will improve Outcome: Progressing Goal: Respiratory complications will improve Outcome: Progressing Goal: Cardiovascular complication will be avoided Outcome: Progressing   Problem: Activity: Goal: Risk for activity intolerance will decrease Outcome: Progressing   Problem: Nutrition: Goal: Adequate nutrition will be maintained Outcome: Completed/Met   Problem: Coping: Goal: Level of anxiety will decrease Outcome: Progressing   Problem: Elimination: Goal: Will not experience complications related to bowel motility Outcome: Progressing Goal: Will not experience complications related to urinary retention Outcome: Completed/Met   Problem: Pain Managment: Goal: General experience of comfort will improve Outcome: Progressing   Problem: Safety: Goal: Ability to remain free from injury will improve Outcome: Progressing   Problem: Skin Integrity: Goal: Risk for impaired skin integrity will decrease Outcome: Progressing

## 2022-07-08 NOTE — TOC Progression Note (Signed)
Transition of Care [facility]) - Progression Note    Patient Details  Name: [name] [name] MRN: [id number] Date of Birth: [birth year]  Transition of Care [facility]) CM/SW Contact  Amada Jupiter, LCSW Phone Number: 07/08/2022, 11:03 AM  Clinical Narrative:     Met with pt and confirming he is in agreement with SNF plan and bed search begun.  Have, also, submitted for insurance authorization.  Pt hopeful this will be short term with plans to return to apt after rehab.  TOC will continue to follow and keep pt/ team updated.  Expected Discharge Plan: Skilled Nursing Facility Barriers to Discharge: Continued Medical Work up  Expected Discharge Plan and Services       Living arrangements for the past 2 months: Apartment Expected Discharge Date: 07/06/22                         HH Arranged: PT, OT HH Agency: [facility] Date HH Agency Contacted: 07/06/22 Time HH Agency Contacted: 1015 Representative spoke with at [facility] Agency: Amy   Social Determinants of Health (SDOH) Interventions SDOH Screenings   Food Insecurity: No Food Insecurity (07/04/2022)  Housing: High Risk (07/04/2022)  Transportation Needs: No Transportation Needs (07/04/2022)  Utilities: Not At Risk (07/04/2022)  Depression (PHQ2-9): Low Risk  (03/09/2019)  Tobacco Use: Medium Risk (07/04/2022)    Readmission Risk Interventions    07/08/2022   10:55 AM  Readmission Risk Prevention Plan  Post Dischage Appt Complete  Medication Screening Complete  Transportation Screening Complete

## 2022-07-08 NOTE — Progress Notes (Signed)
Physical Therapy Treatment Patient Details Name: [name] [name] MRN: [id number] DOB: 09/04/1945 Today's Date: 07/08/2022   History of Present Illness 77 year old M with PMH of COPD/chronic hypoxic RF on 3 L, coronary artery calcification, HTN, HLD, anxiety, alcohol use and tobacco use (quit about 15 years ago) who presented to [facility] ED with progressive shortness of breath for about 2 weeks. Dx of Acute on chronic respiratory failure with hypoxia due to COPD exacerbation and possible ILD    PT Comments    Pt ambulated in hallway and required 3 rest breaks for fatigue and SOB.  Pt cued to perform pursed lip breathing.  SPO2 93% on 3L at rest, 88-91% on 4L during ambulation, and 93% on 3L end of session. Pt anticipates d/c to SNF.    Recommendations for follow up therapy are one component of a multi-disciplinary discharge planning process, led by the attending physician.  Recommendations may be updated based on patient status, additional functional criteria and insurance authorization.  Follow Up Recommendations  Can patient physically be transported by private vehicle: Yes    Assistance Recommended at Discharge Intermittent Supervision/Assistance  Patient can return home with the following A little help with bathing/dressing/bathroom;Assistance with cooking/housework;Assist for transportation;Help with stairs or ramp for entrance   Equipment Recommendations  Rollator (4 wheels)    Recommendations for Other Services       Precautions / Restrictions Precautions Precautions: Fall Precaution Comments: monitor O2, 3L at baseline Restrictions Weight Bearing Restrictions: No     Mobility  Bed Mobility               General bed mobility comments: up in recliner    Transfers Overall transfer level: Needs assistance Equipment used: Rolling walker (2 wheels) Transfers: Sit to/from Stand Sit to Stand: Supervision           General transfer comment: VCs hand placement     Ambulation/Gait Ambulation/Gait assistance: Min guard Gait Distance (Feet): 40 Feet Assistive device: Rolling walker (2 wheels) Gait Pattern/deviations: Step-through pattern, Decreased stride length       General Gait Details: VCs for pursed lip breathing and required 3 rest breaks with first rest break after 40 feet;  pt able to perform total of 160 feet; ambulated on 4L O2 [place] and SPO2 88-91%   Stairs             Wheelchair Mobility    Modified Rankin (Stroke Patients Only)       Balance                                            Cognition Arousal/Alertness: Awake/alert Behavior During Therapy: Anxious, WFL for tasks assessed/performed Overall Cognitive Status: Within Functional Limits for tasks assessed                                          Exercises      General Comments        Pertinent Vitals/Pain Pain Assessment Pain Assessment: No/denies pain    Home Living                          Prior Function            PT Goals (current goals can now  be found in the care plan section) Progress towards PT goals: Progressing toward goals    Frequency    Min 1X/week      PT Plan Current plan remains appropriate    Co-evaluation              AM-PAC PT "6 Clicks" Mobility   Outcome Measure  Help needed turning from your back to your side while in a flat bed without using bedrails?: None Help needed moving from lying on your back to sitting on the side of a flat bed without using bedrails?: None Help needed moving to and from a bed to a chair (including a wheelchair)?: A Little Help needed standing up from a chair using your arms (e.g., wheelchair or bedside chair)?: A Little Help needed to walk in hospital room?: A Little Help needed climbing 3-5 steps with a railing? : A Little 6 Click Score: 20    End of Session Equipment Utilized During Treatment: Gait belt;Oxygen Activity Tolerance:  Patient tolerated treatment well Patient left: in chair;with call bell/phone within reach Nurse Communication: Mobility status PT Visit Diagnosis: Difficulty in walking, not elsewhere classified (R26.2)     Time: 1610-9604 PT Time Calculation (min) (ACUTE ONLY): 17 min  Charges:  $Gait Training: 8-22 mins                     Paulino Door, DPT Physical Therapist Acute Rehabilitation Services Office: [phone]    Janan Halter [place] 07/08/2022, 12:41 PM

## 2022-07-08 NOTE — Assessment & Plan Note (Signed)
Continuing  daily PPI therapy.

## 2022-07-09 DIAGNOSIS — K219 Gastro-esophageal reflux disease without esophagitis: Secondary | ICD-10-CM | POA: Diagnosis not present

## 2022-07-09 DIAGNOSIS — I10 Essential (primary) hypertension: Secondary | ICD-10-CM | POA: Diagnosis not present

## 2022-07-09 DIAGNOSIS — J9621 Acute and chronic respiratory failure with hypoxia: Secondary | ICD-10-CM | POA: Diagnosis not present

## 2022-07-09 DIAGNOSIS — F419 Anxiety disorder, unspecified: Secondary | ICD-10-CM | POA: Diagnosis not present

## 2022-07-09 DIAGNOSIS — J441 Chronic obstructive pulmonary disease with (acute) exacerbation: Secondary | ICD-10-CM | POA: Diagnosis not present

## 2022-07-09 DIAGNOSIS — M6281 Muscle weakness (generalized): Secondary | ICD-10-CM | POA: Diagnosis not present

## 2022-07-09 DIAGNOSIS — R2681 Unsteadiness on feet: Secondary | ICD-10-CM | POA: Diagnosis not present

## 2022-07-09 DIAGNOSIS — F410 Panic disorder [episodic paroxysmal anxiety] without agoraphobia: Secondary | ICD-10-CM | POA: Diagnosis not present

## 2022-07-09 DIAGNOSIS — K5900 Constipation, unspecified: Secondary | ICD-10-CM

## 2022-07-09 DIAGNOSIS — F1010 Alcohol abuse, uncomplicated: Secondary | ICD-10-CM | POA: Diagnosis not present

## 2022-07-09 DIAGNOSIS — J84115 Respiratory bronchiolitis interstitial lung disease: Secondary | ICD-10-CM | POA: Diagnosis not present

## 2022-07-09 DIAGNOSIS — R2689 Other abnormalities of gait and mobility: Secondary | ICD-10-CM | POA: Diagnosis not present

## 2022-07-09 DIAGNOSIS — F320 Major depressive disorder, single episode, mild: Secondary | ICD-10-CM | POA: Diagnosis not present

## 2022-07-09 DIAGNOSIS — F411 Generalized anxiety disorder: Secondary | ICD-10-CM | POA: Diagnosis not present

## 2022-07-09 DIAGNOSIS — R1312 Dysphagia, oropharyngeal phase: Secondary | ICD-10-CM | POA: Diagnosis not present

## 2022-07-09 LAB — CBC WITH DIFFERENTIAL/PLATELET
Abs Immature Granulocytes: 0.08 10*3/uL — ABNORMAL HIGH (ref 0.00–0.07)
Basophils Absolute: 0 10*3/uL (ref 0.0–0.1)
Basophils Relative: 0 %
Eosinophils Absolute: 0 10*3/uL (ref 0.0–0.5)
Eosinophils Relative: 0 %
HCT: 46.1 % (ref 39.0–52.0)
Hemoglobin: 15.6 g/dL (ref 13.0–17.0)
Immature Granulocytes: 1 %
Lymphocytes Relative: 15 %
Lymphs Abs: 1.6 10*3/uL (ref 0.7–4.0)
MCH: 36 pg — ABNORMAL HIGH (ref 26.0–34.0)
MCHC: 33.8 g/dL (ref 30.0–36.0)
MCV: 106.5 fL — ABNORMAL HIGH (ref 80.0–100.0)
Monocytes Absolute: 1 10*3/uL (ref 0.1–1.0)
Monocytes Relative: 9 %
Neutro Abs: 8.5 10*3/uL — ABNORMAL HIGH (ref 1.7–7.7)
Neutrophils Relative %: 75 %
Platelets: 181 10*3/uL (ref 150–400)
RBC: 4.33 MIL/uL (ref 4.22–5.81)
RDW: 13.4 % (ref 11.5–15.5)
WBC: 11.3 10*3/uL — ABNORMAL HIGH (ref 4.0–10.5)
nRBC: 0 % (ref 0.0–0.2)

## 2022-07-09 LAB — COMPREHENSIVE METABOLIC PANEL
ALT: 45 U/L — ABNORMAL HIGH (ref 0–44)
AST: 32 U/L (ref 15–41)
Albumin: 3.3 g/dL — ABNORMAL LOW (ref 3.5–5.0)
Alkaline Phosphatase: 89 U/L (ref 38–126)
Anion gap: 8 (ref 5–15)
BUN: 24 mg/dL — ABNORMAL HIGH (ref 8–23)
CO2: 32 mmol/L (ref 22–32)
Calcium: 8.8 mg/dL — ABNORMAL LOW (ref 8.9–10.3)
Chloride: 96 mmol/L — ABNORMAL LOW (ref 98–111)
Creatinine, Ser: 0.85 mg/dL (ref 0.61–1.24)
GFR, Estimated: >60 mL/min (ref >60)
Glucose, Bld: 111 mg/dL — ABNORMAL HIGH (ref 70–99)
Potassium: 4.2 mmol/L (ref 3.5–5.1)
Sodium: 136 mmol/L (ref 135–145)
Total Bilirubin: 0.8 mg/dL (ref 0.3–1.2)
Total Protein: 6.7 g/dL (ref 6.5–8.1)

## 2022-07-09 LAB — MAGNESIUM: Magnesium: 2.1 mg/dL (ref 1.7–2.4)

## 2022-07-09 MED ORDER — IPRATROPIUM-ALBUTEROL 0.5-2.5 (3) MG/3ML IN SOLN: 3.0000 mL | RESPIRATORY_TRACT | Status: DC | PRN

## 2022-07-09 MED ORDER — DOXYCYCLINE MONOHYDRATE 100 MG PO CAPS: 100.0000 mg | ORAL_CAPSULE | Freq: Two times a day (BID) | ORAL | 0 refills | Status: AC

## 2022-07-09 MED ORDER — POLYETHYLENE GLYCOL 3350 17 G PO PACK: 17.0000 g | PACK | Freq: Every day | ORAL | 0 refills | Status: DC | PRN

## 2022-07-09 MED ORDER — PREDNISONE 10 MG PO TABS: ORAL_TABLET | ORAL | 0 refills | Status: AC

## 2022-07-09 MED ORDER — SENNA 8.6 MG PO TABS: 2.0000 | ORAL_TABLET | Freq: Every day | ORAL | 0 refills | Status: DC

## 2022-07-09 MED ORDER — LISINOPRIL 20 MG PO TABS: 20.0000 mg | ORAL_TABLET | Freq: Every day | ORAL | Status: DC

## 2022-07-09 MED ORDER — ASPIRIN 81 MG PO TBEC: 81.0000 mg | DELAYED_RELEASE_TABLET | Freq: Every day | ORAL | 12 refills | Status: DC

## 2022-07-09 MED ORDER — HYDROXYZINE HCL 25 MG PO TABS: 25.0000 mg | ORAL_TABLET | Freq: Three times a day (TID) | ORAL | 0 refills | Status: DC | PRN

## 2022-07-09 MED ORDER — POLYETHYLENE GLYCOL 3350 17 G PO PACK: 17.0000 g | PACK | Freq: Every day | ORAL | 0 refills | Status: DC

## 2022-07-09 NOTE — Discharge Instructions (Addendum)
Patient to participate with physical therapy per PT recommendations Patient to follow-up with facility provider per protocol. Patient is full code Patient receives a low-sodium low-fat diet Patient to use 3 L of oxygen at rest via nasal cannula. Patient to use 4 L of oxygen with exertion via nasal cannula Patient may get out of bed with assistance and is to use a walker or rollator with ambulation.

## 2022-07-09 NOTE — Progress Notes (Signed)
Notified provider that pt requested acid reflux medication, as he takes Prilosec at home. Pt stated that he felt like he was choking due to the acid reflux. But that he is okay now. He remained warm, dry, no visible distress and was sitting up in recliner. Orders received.

## 2022-07-09 NOTE — Discharge Summary (Signed)
Physician Discharge Summary   Patient: [name] [name] MRN: [id number] DOB: Oct 21, 1945  Admit date:     07/04/2022  Discharge date: 07/09/22  Discharge Physician: Marinda Elk   PCP: Chilton Greathouse, MD   Recommendations at discharge:   Patient to participate with physical therapy per PT recommendations Patient to follow-up with facility provider per protocol. Patient is full code Patient receives a low-sodium low-fat diet Patient to use 3 L of oxygen at rest via nasal cannula. Patient to use 4 L of oxygen with exertion via nasal cannula Patient may get out of bed with assistance and is to use a walker or rollator with ambulation.  Discharge Diagnoses: Principal Problem:   Acute on chronic respiratory failure with hypoxia (HCC) Active Problems:   COPD with acute exacerbation (HCC)   Essential hypertension   Generalized anxiety disorder with panic attacks   Alcohol abuse   Constipation   GERD without esophagitis   Coronary artery calcification seen on CT scan   Former smoker   Alcoholic peripheral neuropathy (HCC)   Macrocytosis   Shortness of breath  Resolved Problems:   * No resolved hospital problems. *   Hospital Course: 77 year old M with PMH of COPD/chronic hypoxic RF on 3 L, coronary artery calcification, HTN, HLD, anxiety, alcohol use and tobacco use (quit about 15 years ago) who presented to [facility] ED with progressive shortness of breath felt to be suffering from COPD exacerbation.  Patient was admitted to the hospital service.  Patient was placed on aggressive bronchodilator therapy as well as intravenous systemic steroids.  Patient was also placed on antibiotics for suspicion of concurrent acute bacterial bronchitis.  In the days that followed, patient's oxygen requirements slowly improved and patient was only requiring 3 L of oxygen at rest per his baseline.  Patient was eventually transitioned from intravenous steroids to oral prednisone to initiate a  prolonged taper.  Repeat oxygen ambulatory assessment prior to discharge revealed the patient needs at least 4 L of oxygen via nasal cannula with exertion.  Hospital course was complicated by bouts of significant anxiety.  Patient has historically used alprazolam in the past however a trial of hydroxyzine was used during this hospitalization with good effect.  Hospital course was also complicated by constipation which was managed with initiation of laxatives.  Patient was evaluated by physical therapy during this hospitalization and it was felt the patient would benefit from skilled physical therapy services in a skilled nursing facility.  Arrangements were made for the patient to be discharged to [facility] on 07/09/2022 in improved and stable condition.     Consultants: None Procedures performed: None  Disposition: Skilled nursing facility Diet recommendation:  Discharge Diet Orders (From admission, onward)     Start     Ordered   07/06/22 0000  Diet - low sodium heart healthy        07/06/22 0812           Cardiac diet  DISCHARGE MEDICATION: Allergies as of 07/09/2022       Reactions   Fluticasone-salmeterol Shortness Of Breath, Other (See Comments)   "Worsening shortness of breath.."        Medication List     STOP taking these medications    ALPRAZolam 0.25 MG tablet Commonly known as: XANAX   amLODipine 5 MG tablet Commonly known as: NORVASC   ibuprofen 200 MG tablet Commonly known as: ADVIL       TAKE these medications  albuterol 108 (90 Base) MCG/ACT inhaler Commonly known as: VENTOLIN HFA Inhale 2 puffs into the lungs every 6 (six) hours as needed for wheezing or shortness of breath. What changed:  when to take this additional instructions   aspirin EC 81 MG tablet Take 1 tablet (81 mg total) by mouth daily. Swallow whole. Start taking on: July 10, 2022   atorvastatin 20 MG tablet Commonly known as: LIPITOR Take 20 mg  by mouth daily.   Breztri Aerosphere 160-9-4.8 MCG/ACT Aero Generic drug: Budeson-Glycopyrrol-Formoterol Inhale 2 puffs into the lungs in the morning and at bedtime. What changed: Another medication with the same name was removed. Continue taking this medication, and follow the directions you see here.   cyanocobalamin 1000 MCG tablet Commonly known as: VITAMIN B12 Take 1,000 mcg by mouth daily.   doxycycline 100 MG capsule Commonly known as: MONODOX Take 1 capsule (100 mg total) by mouth 2 (two) times daily for 5 days.   furosemide 40 MG tablet Commonly known as: LASIX Take 40 mg by mouth in the morning.   hydrOXYzine 25 MG tablet Commonly known as: ATARAX Take 1 tablet (25 mg total) by mouth 3 (three) times daily as needed for anxiety.   ipratropium-albuterol 0.5-2.5 (3) MG/3ML Soln Commonly known as: DUONEB Take 3 mLs by nebulization every 4 (four) hours as needed (shortness of breath or wheezing).   lisinopril 20 MG tablet Commonly known as: ZESTRIL Take 1 tablet (20 mg total) by mouth daily. Start taking on: July 10, 2022   multivitamin with minerals tablet Take 1 tablet by mouth daily with breakfast.   OXYGEN Inhale 3 L/min into the lungs continuous.   polyethylene glycol 17 g packet Commonly known as: MIRALAX / GLYCOLAX Take 17 g by mouth daily as needed for mild constipation.   polyethylene glycol 17 g packet Commonly known as: MIRALAX / GLYCOLAX Take 17 g by mouth daily. Start taking on: July 10, 2022   Potassium Chloride ER 20 MEQ Tbcr Take 20 mEq by mouth daily.   predniSONE 10 MG tablet Commonly known as: DELTASONE Take 4 tablets (40 mg total) by mouth daily with breakfast for 3 days, THEN 3 tablets (30 mg total) daily with breakfast for 3 days, THEN 2 tablets (20 mg total) daily with breakfast for 3 days, THEN 1 tablet (10 mg total) daily with breakfast for 3 days. Start taking on: July 09, 2022 What changed: See the new instructions.   senna 8.6 MG  Tabs tablet Commonly known as: SENOKOT Take 2 tablets (17.2 mg total) by mouth at bedtime.   sertraline 50 MG tablet Commonly known as: ZOLOFT Take 100 mg by mouth in the morning.               Durable Medical Equipment  (From admission, onward)           Start     Ordered   07/05/22 1147  For home use only DME 4 wheeled rolling walker with seat  Once       Question:  Patient needs a walker to treat with the following condition  Answer:  Difficulty in walking, not elsewhere classified   07/05/22 1147            Follow-up Information     Avva, Ravisankar, MD. Schedule an appointment as soon as possible for a visit in 1 week(s).   Specialty: Internal Medicine Contact information: [address] [place] [address] [phone]  [facility] Follow up.   Why: Home Health will follow up with you 24hr to 48hrs after discharge.        [facility] Follow up.   Specialty: Skilled Nursing Facility Contact information: [address] [place] [address] [phone]                 Discharge Exam: Ceasar Mons Weights   07/04/22 1039  Weight: 102.1 kg    Constitutional: Awake alert and oriented x3, no associated distress.   Respiratory: Much improved wheezing and air movement compared to prior examinations.  Minimal inspiratory and expiratory wheezing heard in the right middle and lower fields.  No evidence of rales.  Normal respiratory effort. No accessory muscle use.  Cardiovascular: Regular rate and rhythm, no murmurs / rubs / gallops. No extremity edema. 2+ pedal pulses. No carotid bruits.  Abdomen: Abdomen is soft and nontender.  No evidence of intra-abdominal masses.  Positive bowel sounds noted in all quadrants.   Musculoskeletal: No joint deformity upper and lower extremities. Good ROM, no contractures. Normal muscle tone.     Condition at discharge: fair  The results of significant diagnostics from this  hospitalization (including imaging, microbiology, ancillary and laboratory) are listed below for reference.   Imaging Studies: ECHOCARDIOGRAM COMPLETE  Result Date: 07/05/2022    ECHOCARDIOGRAM REPORT   Patient Name:   HARJIT PORTMAN Selke Date of Exam: 07/05/2022 Medical Rec #:  [id number]            Height:       71.0 in Accession #:    [id number]           Weight:       225.1 lb Date of Birth:  1945-05-11            BSA:          2.217 m Patient Age:    76 years             BP:           148/69 mmHg Patient Gender: M                    HR:           91 bpm. Exam Location:  Inpatient Procedure: 2D Echo, Cardiac Doppler and Color Doppler Indications:    Dyspnea  History:        Patient has prior history of Echocardiogram examinations, most                 recent 10/05/2019. COPD, Signs/Symptoms:acute respiratory                 failure, Shortness of Breath and Dyspnea; Risk                 Factors:Hypertension, Former Smoker, alcohol abuse and                 Dyslipidemia.  Sonographer:    Wallie Char Referring Phys: [id number] TAYE T GONFA IMPRESSIONS  1. Left ventricular ejection fraction, by estimation, is 65 to 70%. The left ventricle has normal function. The left ventricle has no regional wall motion abnormalities. Left ventricular diastolic parameters are consistent with Grade I diastolic dysfunction (impaired relaxation).  2. Right ventricular systolic function is normal. The right ventricular size is normal.  3. Left atrial size was mildly dilated.  4. Right atrial size was mildly dilated.  5. The mitral valve is normal in structure.  No evidence of mitral valve regurgitation. No evidence of mitral stenosis.  6. The aortic valve is tricuspid. There is mild calcification of the aortic valve. Aortic valve regurgitation is not visualized. Aortic valve sclerosis/calcification is present, without any evidence of aortic stenosis. FINDINGS  Left Ventricle: Left ventricular ejection fraction, by estimation, is 65  to 70%. The left ventricle has normal function. The left ventricle has no regional wall motion abnormalities. The left ventricular internal cavity size was normal in size. There is  no left ventricular hypertrophy. Left ventricular diastolic parameters are consistent with Grade I diastolic dysfunction (impaired relaxation). Right Ventricle: The right ventricular size is normal. No increase in right ventricular wall thickness. Right ventricular systolic function is normal. Left Atrium: Left atrial size was mildly dilated. Right Atrium: Right atrial size was mildly dilated. Pericardium: There is no evidence of pericardial effusion. Mitral Valve: The mitral valve is normal in structure. Mild to moderate mitral annular calcification. No evidence of mitral valve regurgitation. No evidence of mitral valve stenosis. MV peak gradient, 7.2 mmHg. The mean mitral valve gradient is 4.0 mmHg. Tricuspid Valve: The tricuspid valve is normal in structure. Tricuspid valve regurgitation is trivial. No evidence of tricuspid stenosis. Aortic Valve: The aortic valve is tricuspid. There is mild calcification of the aortic valve. Aortic valve regurgitation is not visualized. Aortic valve sclerosis/calcification is present, without any evidence of aortic stenosis. Aortic valve mean gradient measures 6.0 mmHg. Aortic valve peak gradient measures 10.4 mmHg. Aortic valve area, by VTI measures 2.40 cm. Pulmonic Valve: The pulmonic valve was normal in structure. Pulmonic valve regurgitation is not visualized. No evidence of pulmonic stenosis. Aorta: The aortic root is normal in size and structure. IAS/Shunts: No atrial level shunt detected by color flow Doppler.  LEFT VENTRICLE PLAX 2D LVIDd:         3.40 cm      Diastology LVIDs:         2.10 cm      LV e' medial:    9.52 cm/s LV PW:         0.90 cm      LV E/e' medial:  12.3 LV IVS:        1.20 cm      LV e' lateral:   8.97 cm/s LVOT diam:     1.80 cm      LV E/e' lateral: 13.0 LV SV:          84 LV SV Index:   38 LVOT Area:     2.54 cm  LV Volumes (MOD) LV vol d, MOD A2C: 95.4 ml LV vol d, MOD A4C: 128.0 ml LV vol s, MOD A2C: 34.0 ml LV vol s, MOD A4C: 43.9 ml LV SV MOD A2C:     61.4 ml LV SV MOD A4C:     128.0 ml LV SV MOD BP:      72.6 ml RIGHT VENTRICLE RV Basal diam:  4.70 cm RV S prime:     17.20 cm/s TAPSE (M-mode): 2.9 cm LEFT ATRIUM             Index        RIGHT ATRIUM           Index LA diam:        3.90 cm 1.76 cm/m   RA Area:     21.80 cm LA Vol (A2C):   66.0 ml 29.77 ml/m  RA Volume:   72.60 ml  32.74 ml/m LA Vol (A4C):  57.1 ml 25.75 ml/m LA Biplane Vol: 62.2 ml 28.05 ml/m  AORTIC VALVE AV Area (Vmax):    2.43 cm AV Area (Vmean):   2.32 cm AV Area (VTI):     2.40 cm AV Vmax:           161.50 cm/s AV Vmean:          116.500 cm/s AV VTI:            0.349 m AV Peak Grad:      10.4 mmHg AV Mean Grad:      6.0 mmHg LVOT Vmax:         154.50 cm/s LVOT Vmean:        106.000 cm/s LVOT VTI:          0.328 m LVOT/AV VTI ratio: 0.94  AORTA Ao Root diam: 3.60 cm Ao Asc diam:  2.70 cm MITRAL VALVE                TRICUSPID VALVE MV Area (PHT): 2.23 cm     TR Peak grad:   23.6 mmHg MV Area VTI:   2.38 cm     TR Vmax:        243.00 cm/s MV Peak grad:  7.2 mmHg MV Mean grad:  4.0 mmHg     SHUNTS MV Vmax:       1.34 m/s     Systemic VTI:  0.33 m MV Vmean:      91.6 cm/s    Systemic Diam: 1.80 cm MV Decel Time: 340 msec MV E velocity: 117.00 cm/s MV A velocity: 135.00 cm/s MV E/A ratio:  0.87 Arvilla Meres MD Electronically signed by Arvilla Meres MD Signature Date/Time: 07/05/2022/6:06:12 PM    Final    CT Chest High Resolution  Result Date: 07/05/2022 CLINICAL DATA:  77 year old male with suspected interstitial lung disease. EXAM: CT CHEST WITHOUT CONTRAST TECHNIQUE: Multidetector CT imaging of the chest was performed following the standard protocol without intravenous contrast. High resolution imaging of the lungs, as well as inspiratory and expiratory imaging, was performed.  RADIATION DOSE REDUCTION: This exam was performed according to the departmental dose-optimization program which includes automated exposure control, adjustment of the mA and/or kV according to patient size and/or use of iterative reconstruction technique. COMPARISON:  Low-dose lung cancer screening chest CT 06/25/2022. FINDINGS: Cardiovascular: Heart size is normal. There is no significant pericardial fluid, thickening or pericardial calcification. There is aortic atherosclerosis, as well as atherosclerosis of the great vessels of the mediastinum and the coronary arteries, including calcified atherosclerotic plaque in the left main, left anterior descending, left circumflex and right coronary arteries. Mild calcifications of the aortic valve. Moderate calcifications of the mitral annulus. Mediastinum/Nodes: No pathologically enlarged mediastinal or hilar lymph nodes. Please note that accurate exclusion of hilar adenopathy is limited on noncontrast CT scans. Esophagus is unremarkable in appearance. No axillary lymphadenopathy. Lungs/Pleura: Diffuse bronchial wall thickening with moderate centrilobular and paraseptal emphysema. There is some focal thickening of the peribronchovascular interstitium and regional architectural distortion in the right upper lobe, similar to the prior examination, presumably an area of chronic post infectious or inflammatory scarring. High-resolution images otherwise demonstrate no generalized regions of ground-glass attenuation, septal thickening, subpleural reticulation, traction bronchiectasis or honeycombing to indicate interstitial lung disease. Inspiratory and expiratory imaging is considered nondiagnostic as it appears that both phases are largely during expiration, however, there is likely air trapping. In addition, there is some collapse of the main bronchi indicative of bronchomalacia. Multiple other  smaller pulmonary nodules appear similar to the recent lung cancer screening  chest CT. Upper Abdomen: Aortic atherosclerosis. Musculoskeletal: There are no aggressive appearing lytic or blastic lesions noted in the visualized portions of the skeleton. IMPRESSION: 1. No definitive imaging findings to suggest interstitial lung disease. 2. Probable chronic postinfectious or inflammatory scarring in the right upper lobe, as above. As previously recommended, repeat lung cancer screening chest CT is suggested in late September 2024 for repeat evaluation. 3. Diffuse bronchial wall thickening with moderate centrilobular and paraseptal emphysema; imaging findings suggestive of underlying COPD. 4. Probable air trapping, suggesting small airways disease. 5. Bronchomalacia. 6. Aortic atherosclerosis, in addition to left main and three-vessel coronary artery disease. Assessment for potential risk factor modification, dietary therapy or pharmacologic therapy may be warranted, if clinically indicated. 7. There are calcifications of the aortic valve and mitral annulus. Echocardiographic correlation for evaluation of potential valvular dysfunction may be warranted if clinically indicated. Aortic Atherosclerosis (ICD10-I70.0) and Emphysema (ICD10-J43.9). Electronically Signed   By: Trudie Reed M.D.   On: 07/05/2022 06:46   DG Chest Portable 1 View  Result Date: 07/04/2022 CLINICAL DATA:  Worsening productive cough. Shortness of breath and wheezing. EXAM: PORTABLE CHEST 1 VIEW COMPARISON:  CT examination dated June 25, 2022 FINDINGS: The heart is enlarged. Atherosclerotic calcification of the aortic arch. Emphysematous changes and prominent interstitial lung markings consistent with chronic interstitial lung disease. No focal consolidation or pleural effusion. IMPRESSION: 1. Cardiomegaly. 2. Emphysematous changes and prominent interstitial lung markings consistent with severe chronic interstitial lung disease. Acute on chronic infectious/inflammatory process can not be completely excluded.  Electronically Signed   By: Larose Hires D.O.   On: 07/04/2022 12:36   CT CHEST LUNG CA SCREEN LOW DOSE W/O CM  Result Date: 06/30/2022 CLINICAL DATA:  77 year old male with 80 pack-year history of smoking. Lung cancer screening. EXAM: CT CHEST WITHOUT CONTRAST LOW-DOSE FOR LUNG CANCER SCREENING TECHNIQUE: Multidetector CT imaging of the chest was performed following the standard protocol without IV contrast. RADIATION DOSE REDUCTION: This exam was performed according to the departmental dose-optimization program which includes automated exposure control, adjustment of the mA and/or kV according to patient size and/or use of iterative reconstruction technique. COMPARISON:  07/04/2020 FINDINGS: Cardiovascular: The heart size is normal. No substantial pericardial effusion. Mild atherosclerotic calcification is noted in the wall of the thoracic aorta. Coronary artery calcification is evident. Mediastinum/Nodes: No mediastinal lymphadenopathy. No evidence for gross hilar lymphadenopathy although assessment is limited by the lack of intravenous contrast on the current study. The esophagus has normal imaging features. There is no axillary lymphadenopathy. Lungs/Pleura: Centrilobular and paraseptal emphysema evident. Previously identified scattered tiny pulmonary nodules are stable in the interval. Since the prior study the patient has developed consolidative opacity in the posterior right upper lobe along the major fissure, also mildly progressive since chest CTA of 06/22/2021. Dominant nodular component measures 9.5 mm on image 81. No pleural effusion. Upper Abdomen: Unremarkable. Musculoskeletal: No worrisome lytic or sclerotic osseous abnormality. IMPRESSION: 1. Lung-RADS 4B, suspicious. New patchy and nodular consolidative opacity in the posterior right upper lobe along the fissure. Dominant nodular component at 9.5 mm. Findings may be infectious/inflammatory and atypical infection would be a consideration.  Additional imaging evaluation or consultation with Pulmonology or Thoracic Surgery recommended. Follow-up lung cancer screening CT in 3 months after appropriate therapy may prove helpful. 2.  Emphysema (ICD10-J43.9) and Aortic Atherosclerosis (ICD10-170.0) These results will be called to the ordering clinician or representative by the Radiologist Assistant, and communication  documented in the PACS or [employer]. Electronically Signed   By: Kennith Center M.D.   On: 06/30/2022 13:52   Microbiology: Results for orders placed or performed during the hospital encounter of 07/04/22  Resp panel by RT-PCR (RSV, Flu A&B, Covid) Anterior Nasal Swab     Status: None   Collection Time: 07/04/22 12:23 PM   Specimen: Anterior Nasal Swab  Result Value Ref Range Status   SARS Coronavirus 2 by RT PCR NEGATIVE NEGATIVE Final    Comment: (NOTE) SARS-CoV-2 target nucleic acids are NOT DETECTED.  The SARS-CoV-2 RNA is generally detectable in upper respiratory specimens during the acute phase of infection. The lowest concentration of SARS-CoV-2 viral copies this assay can detect is 138 copies/mL. A negative result does not preclude SARS-Cov-2 infection and should not be used as the sole basis for treatment or other patient management decisions. A negative result may occur with  improper specimen collection/handling, submission of specimen other than nasopharyngeal swab, presence of viral mutation(s) within the areas targeted by this assay, and inadequate number of viral copies(<138 copies/mL). A negative result must be combined with clinical observations, patient history, and epidemiological information. The expected result is Negative.  Fact Sheet for Patients:  BloggerCourse.com  Fact Sheet for Healthcare Providers:  SeriousBroker.it  This test is no t yet approved or cleared by the [place] FDA and  has been authorized for detection and/or  diagnosis of SARS-CoV-2 by FDA under an Emergency Use Authorization (EUA). This EUA will remain  in effect (meaning this test can be used) for the duration of the COVID-19 declaration under Section 564(b)(1) of the Act, 21 U.S.C.section 360bbb-3(b)(1), unless the authorization is terminated  or revoked sooner.       Influenza A by PCR NEGATIVE NEGATIVE Final   Influenza B by PCR NEGATIVE NEGATIVE Final    Comment: (NOTE) The Xpert Xpress SARS-CoV-2/FLU/RSV plus assay is intended as an aid in the diagnosis of influenza from Nasopharyngeal swab specimens and should not be used as a sole basis for treatment. Nasal washings and aspirates are unacceptable for Xpert Xpress SARS-CoV-2/FLU/RSV testing.  Fact Sheet for Patients: BloggerCourse.com  Fact Sheet for Healthcare Providers: SeriousBroker.it  This test is not yet approved or cleared by the [place] FDA and has been authorized for detection and/or diagnosis of SARS-CoV-2 by FDA under an Emergency Use Authorization (EUA). This EUA will remain in effect (meaning this test can be used) for the duration of the COVID-19 declaration under Section 564(b)(1) of the Act, 21 U.S.C. section 360bbb-3(b)(1), unless the authorization is terminated or revoked.     Resp Syncytial Virus by PCR NEGATIVE NEGATIVE Final    Comment: (NOTE) Fact Sheet for Patients: BloggerCourse.com  Fact Sheet for Healthcare Providers: SeriousBroker.it  This test is not yet approved or cleared by the [place] FDA and has been authorized for detection and/or diagnosis of SARS-CoV-2 by FDA under an Emergency Use Authorization (EUA). This EUA will remain in effect (meaning this test can be used) for the duration of the COVID-19 declaration under Section 564(b)(1) of the Act, 21 U.S.C. section 360bbb-3(b)(1), unless the authorization is terminated  or revoked.  Performed at [name], [address] [place], [place] [address]   Respiratory (~20 pathogens) panel by PCR     Status: None   Collection Time: 07/04/22 12:23 PM   Specimen: Nasopharyngeal Swab; Respiratory  Result Value Ref Range Status   Adenovirus NOT DETECTED NOT DETECTED Final   Coronavirus 229E NOT  DETECTED NOT DETECTED Final    Comment: (NOTE) The Coronavirus on the Respiratory Panel, DOES NOT test for the novel  Coronavirus (2019 nCoV)    Coronavirus HKU1 NOT DETECTED NOT DETECTED Final   Coronavirus NL63 NOT DETECTED NOT DETECTED Final   Coronavirus OC43 NOT DETECTED NOT DETECTED Final   Metapneumovirus NOT DETECTED NOT DETECTED Final   Rhinovirus / Enterovirus NOT DETECTED NOT DETECTED Final   Influenza A NOT DETECTED NOT DETECTED Final   Influenza B NOT DETECTED NOT DETECTED Final   Parainfluenza Virus 1 NOT DETECTED NOT DETECTED Final   Parainfluenza Virus 2 NOT DETECTED NOT DETECTED Final   Parainfluenza Virus 3 NOT DETECTED NOT DETECTED Final   Parainfluenza Virus 4 NOT DETECTED NOT DETECTED Final   Respiratory Syncytial Virus NOT DETECTED NOT DETECTED Final   Bordetella pertussis NOT DETECTED NOT DETECTED Final   Bordetella Parapertussis NOT DETECTED NOT DETECTED Final   Chlamydophila pneumoniae NOT DETECTED NOT DETECTED Final   Mycoplasma pneumoniae NOT DETECTED NOT DETECTED Final    Comment: Performed at [facility], [address] [place], [place] [address]    Labs: CBC: Recent Labs  Lab 07/04/22 1154 07/05/22 0431 07/06/22 0403 07/07/22 0516 07/08/22 0548 07/09/22 0422  WBC 8.7 6.2 9.5 9.9 11.1* 11.3*  NEUTROABS 6.4  --   --   --  8.6* 8.5*  HGB 14.8 14.2 14.2 15.4 15.4 15.6  HCT 42.7 42.5 41.4 45.2 46.0 46.1  MCV 104.1* 106.5* 105.9* 105.6* 105.5* 106.5*  PLT 166 170 159 173 175 181   Basic Metabolic Panel: Recent Labs  Lab 07/05/22 0431 07/06/22 0403 07/07/22 0516 07/08/22 0548 07/09/22 0422   NA 132* 133* 132* 133* 136  K 4.8 3.8 4.2 4.1 4.2  CL 94* 93* 93* 92* 96*  CO2 29 31 32 33* 32  GLUCOSE 164* 118* 136* 113* 111*  BUN 10 13 15 20  24*  CREATININE 0.65 0.71 0.65 0.78 0.85  CALCIUM 9.0 8.9 8.9 9.0 8.8*  MG 1.8 2.0 2.1 2.2 2.1  PHOS 4.0 3.9 4.4  --   --    Liver Function Tests: Recent Labs  Lab 07/04/22 1154 07/05/22 0431 07/06/22 0403 07/07/22 0516 07/08/22 0548 07/09/22 0422  AST 29  --   --   --  31 32  ALT 18  --   --   --  36 45*  ALKPHOS 124  --   --   --  97 89  BILITOT 0.9  --   --   --  0.7 0.8  PROT 7.3  --   --   --  6.9 6.7  ALBUMIN 3.9 3.4* 3.1* 3.2* 3.3* 3.3*   CBG: No results for input(s): "GLUCAP" in the last 168 hours.  Discharge time spent: greater than 30 minutes.  Signed: Marinda Elk, MD [facility] 07/09/2022

## 2022-07-09 NOTE — Progress Notes (Signed)
Mobility Specialist - Progress Note   07/09/22 1315  Oxygen Therapy  SpO2 (!) 83 %  O2 Device Nasal Cannula  O2 Flow Rate (L/min) 3 L/min  Patient Activity (if Appropriate) Ambulating  Mobility  Activity Ambulated with assistance in hallway  Level of Assistance Standby assist, set-up cues, supervision of patient - no hands on  Assistive Device Front wheel walker  Distance Ambulated (ft) 275 ft  Activity Response Tolerated well  Mobility Referral Yes  $Mobility charge 1 Mobility  Mobility Specialist Start Time (ACUTE ONLY) 1243  Mobility Specialist Stop Time (ACUTE ONLY) 1256  Mobility Specialist Time Calculation (min) (ACUTE ONLY) 13 min   Pt received in recliner and agreeable to mobility. During ambulation, pt desat to 83%. Encouraged pursed lip breaths bring O2 back up to 93%. Upon returning to room, pt c/o feeling "winded". No other complaints during session. Pt to recliner after session with all needs met.   Pre-mobility: 92% SpO2 (3L [place]) During mobility: 83% SpO2 (3L [place]) Post-mobility: 89% SPO2 (3L [place])  Chief Technology Officer

## 2022-07-09 NOTE — TOC Transition Note (Signed)
Transition of Care [facility]) - CM/SW Discharge Note   Patient Details  Name: [name] [name] MRN: [id number] Date of Birth: [birth year]  Transition of Care [facility]) CM/SW Contact:  Amada Jupiter, LCSW Phone Number: 07/09/2022, 2:32 PM   Clinical Narrative:     Have reviewed SNF bed offers with pt and bed accepted at [facility] who can admit pt today.  Have received ins auth for SNF [id number]) but denied [facility] transport.  Pt requests that [facility] transport him to facility and he plans to appeal this denial - instructions provided to him.  [facility] called at 1405.  RN to call report to [phone].  No further TOC needs.  Final next level of care: Skilled Nursing Facility Barriers to Discharge: Barriers Resolved   Patient Goals and CMS Choice CMS Medicare.gov Compare Post Acute Care list provided to:: Patient Choice offered to / list presented to : Patient  Discharge Placement PASRR number recieved: 07/08/22 PASRR number recieved: 07/08/22            Patient chooses bed at: [facility] Patient to be transferred to facility by: [facility] Name of family member notified: ex-wife Patient and family notified of of transfer: 07/09/22  Discharge Plan and Services Additional resources added to the After Visit Summary for                            [facility] Arranged: PT, OT [facility] Agency: [facility] Date [facility] Agency Contacted: 07/06/22 Time HH Agency Contacted: 1015 Representative spoke with at [facility] Agency: Amy  Social Determinants of Health (SDOH) Interventions SDOH Screenings   Food Insecurity: No Food Insecurity (07/04/2022)  Housing: High Risk (07/04/2022)  Transportation Needs: No Transportation Needs (07/04/2022)  Utilities: Not At Risk (07/04/2022)  Depression (PHQ2-9): Low Risk  (03/09/2019)  Tobacco Use: Medium Risk (07/04/2022)     Readmission Risk Interventions    07/08/2022   10:55 AM  Readmission Risk Prevention Plan  Post Dischage Appt Complete   Medication Screening Complete  Transportation Screening Complete

## 2022-07-09 NOTE — Progress Notes (Signed)
Patient was picked up by [facility] to transfer to [facility]. Packet was given to PT[facility], and patient was stable for discharge. Report was given to RN at the facility prior to discharge.

## 2022-07-10 DIAGNOSIS — J9621 Acute and chronic respiratory failure with hypoxia: Secondary | ICD-10-CM | POA: Diagnosis not present

## 2022-07-11 NOTE — Telephone Encounter (Signed)
Patient declined hospital follow up due to in Rehab. Would like to speak to Dr. Delton Coombes or Dr. Vassie Loll. Patient phone number is [phone].

## 2022-07-11 NOTE — Telephone Encounter (Signed)
ATC x1 LVM for patient to call our office back to make a Hospital f/u per Dr.Byrum with Dr.Alva or a App.

## 2022-07-12 DIAGNOSIS — J441 Chronic obstructive pulmonary disease with (acute) exacerbation: Secondary | ICD-10-CM | POA: Diagnosis not present

## 2022-07-12 DIAGNOSIS — J84115 Respiratory bronchiolitis interstitial lung disease: Secondary | ICD-10-CM | POA: Diagnosis not present

## 2022-07-12 DIAGNOSIS — F1010 Alcohol abuse, uncomplicated: Secondary | ICD-10-CM | POA: Diagnosis not present

## 2022-07-12 DIAGNOSIS — F320 Major depressive disorder, single episode, mild: Secondary | ICD-10-CM | POA: Diagnosis not present

## 2022-07-13 DIAGNOSIS — J441 Chronic obstructive pulmonary disease with (acute) exacerbation: Secondary | ICD-10-CM | POA: Diagnosis not present

## 2022-07-13 DIAGNOSIS — K219 Gastro-esophageal reflux disease without esophagitis: Secondary | ICD-10-CM | POA: Diagnosis not present

## 2022-07-16 DIAGNOSIS — J441 Chronic obstructive pulmonary disease with (acute) exacerbation: Secondary | ICD-10-CM | POA: Diagnosis not present

## 2022-07-16 DIAGNOSIS — R2681 Unsteadiness on feet: Secondary | ICD-10-CM | POA: Diagnosis not present

## 2022-07-16 DIAGNOSIS — F419 Anxiety disorder, unspecified: Secondary | ICD-10-CM | POA: Diagnosis not present

## 2022-07-16 DIAGNOSIS — M6281 Muscle weakness (generalized): Secondary | ICD-10-CM | POA: Diagnosis not present

## 2022-07-16 DIAGNOSIS — R2689 Other abnormalities of gait and mobility: Secondary | ICD-10-CM | POA: Diagnosis not present

## 2022-07-16 DIAGNOSIS — J9621 Acute and chronic respiratory failure with hypoxia: Secondary | ICD-10-CM | POA: Diagnosis not present

## 2022-07-19 DIAGNOSIS — F419 Anxiety disorder, unspecified: Secondary | ICD-10-CM | POA: Diagnosis not present

## 2022-07-19 DIAGNOSIS — R2681 Unsteadiness on feet: Secondary | ICD-10-CM | POA: Diagnosis not present

## 2022-07-19 DIAGNOSIS — J9621 Acute and chronic respiratory failure with hypoxia: Secondary | ICD-10-CM | POA: Diagnosis not present

## 2022-07-19 DIAGNOSIS — J441 Chronic obstructive pulmonary disease with (acute) exacerbation: Secondary | ICD-10-CM | POA: Diagnosis not present

## 2022-07-19 DIAGNOSIS — M6281 Muscle weakness (generalized): Secondary | ICD-10-CM | POA: Diagnosis not present

## 2022-07-19 DIAGNOSIS — R2689 Other abnormalities of gait and mobility: Secondary | ICD-10-CM | POA: Diagnosis not present

## 2022-07-22 DIAGNOSIS — J441 Chronic obstructive pulmonary disease with (acute) exacerbation: Secondary | ICD-10-CM | POA: Diagnosis not present

## 2022-07-22 DIAGNOSIS — I10 Essential (primary) hypertension: Secondary | ICD-10-CM | POA: Diagnosis not present

## 2022-07-22 DIAGNOSIS — F1010 Alcohol abuse, uncomplicated: Secondary | ICD-10-CM | POA: Diagnosis not present

## 2022-07-22 DIAGNOSIS — J84115 Respiratory bronchiolitis interstitial lung disease: Secondary | ICD-10-CM | POA: Diagnosis not present

## 2022-07-23 DIAGNOSIS — F419 Anxiety disorder, unspecified: Secondary | ICD-10-CM | POA: Diagnosis not present

## 2022-07-23 DIAGNOSIS — R2689 Other abnormalities of gait and mobility: Secondary | ICD-10-CM | POA: Diagnosis not present

## 2022-07-23 DIAGNOSIS — J9621 Acute and chronic respiratory failure with hypoxia: Secondary | ICD-10-CM | POA: Diagnosis not present

## 2022-07-23 DIAGNOSIS — R2681 Unsteadiness on feet: Secondary | ICD-10-CM | POA: Diagnosis not present

## 2022-07-23 DIAGNOSIS — M6281 Muscle weakness (generalized): Secondary | ICD-10-CM | POA: Diagnosis not present

## 2022-07-23 DIAGNOSIS — J441 Chronic obstructive pulmonary disease with (acute) exacerbation: Secondary | ICD-10-CM | POA: Diagnosis not present

## 2022-07-25 DIAGNOSIS — F320 Major depressive disorder, single episode, mild: Secondary | ICD-10-CM | POA: Diagnosis not present

## 2022-07-25 DIAGNOSIS — I10 Essential (primary) hypertension: Secondary | ICD-10-CM | POA: Diagnosis not present

## 2022-07-25 DIAGNOSIS — J441 Chronic obstructive pulmonary disease with (acute) exacerbation: Secondary | ICD-10-CM | POA: Diagnosis not present

## 2022-07-25 DIAGNOSIS — K219 Gastro-esophageal reflux disease without esophagitis: Secondary | ICD-10-CM | POA: Diagnosis not present

## 2022-07-26 DIAGNOSIS — J441 Chronic obstructive pulmonary disease with (acute) exacerbation: Secondary | ICD-10-CM | POA: Diagnosis not present

## 2022-07-26 DIAGNOSIS — F419 Anxiety disorder, unspecified: Secondary | ICD-10-CM | POA: Diagnosis not present

## 2022-07-26 DIAGNOSIS — R2681 Unsteadiness on feet: Secondary | ICD-10-CM | POA: Diagnosis not present

## 2022-07-26 DIAGNOSIS — R2689 Other abnormalities of gait and mobility: Secondary | ICD-10-CM | POA: Diagnosis not present

## 2022-07-26 DIAGNOSIS — M6281 Muscle weakness (generalized): Secondary | ICD-10-CM | POA: Diagnosis not present

## 2022-07-26 DIAGNOSIS — J9621 Acute and chronic respiratory failure with hypoxia: Secondary | ICD-10-CM | POA: Diagnosis not present

## 2022-07-31 DIAGNOSIS — I700 Atherosclerosis of aorta: Secondary | ICD-10-CM | POA: Diagnosis not present

## 2022-07-31 DIAGNOSIS — J439 Emphysema, unspecified: Secondary | ICD-10-CM | POA: Diagnosis not present

## 2022-07-31 DIAGNOSIS — I2510 Atherosclerotic heart disease of native coronary artery without angina pectoris: Secondary | ICD-10-CM | POA: Diagnosis not present

## 2022-07-31 DIAGNOSIS — M25571 Pain in right ankle and joints of right foot: Secondary | ICD-10-CM | POA: Diagnosis not present

## 2022-07-31 DIAGNOSIS — I119 Hypertensive heart disease without heart failure: Secondary | ICD-10-CM | POA: Diagnosis not present

## 2022-07-31 DIAGNOSIS — Z7951 Long term (current) use of inhaled steroids: Secondary | ICD-10-CM | POA: Diagnosis not present

## 2022-07-31 DIAGNOSIS — J9611 Chronic respiratory failure with hypoxia: Secondary | ICD-10-CM | POA: Diagnosis not present

## 2022-07-31 DIAGNOSIS — Z9981 Dependence on supplemental oxygen: Secondary | ICD-10-CM | POA: Diagnosis not present

## 2022-07-31 DIAGNOSIS — J441 Chronic obstructive pulmonary disease with (acute) exacerbation: Secondary | ICD-10-CM | POA: Diagnosis not present

## 2022-07-31 DIAGNOSIS — F1010 Alcohol abuse, uncomplicated: Secondary | ICD-10-CM | POA: Diagnosis not present

## 2022-07-31 DIAGNOSIS — E785 Hyperlipidemia, unspecified: Secondary | ICD-10-CM | POA: Diagnosis not present

## 2022-07-31 DIAGNOSIS — F17200 Nicotine dependence, unspecified, uncomplicated: Secondary | ICD-10-CM | POA: Diagnosis not present

## 2022-07-31 DIAGNOSIS — Z79899 Other long term (current) drug therapy: Secondary | ICD-10-CM | POA: Diagnosis not present

## 2022-07-31 DIAGNOSIS — J208 Acute bronchitis due to other specified organisms: Secondary | ICD-10-CM | POA: Diagnosis not present

## 2022-07-31 DIAGNOSIS — J440 Chronic obstructive pulmonary disease with acute lower respiratory infection: Secondary | ICD-10-CM | POA: Diagnosis not present

## 2022-07-31 DIAGNOSIS — Z602 Problems related to living alone: Secondary | ICD-10-CM | POA: Diagnosis not present

## 2022-07-31 DIAGNOSIS — J84115 Respiratory bronchiolitis interstitial lung disease: Secondary | ICD-10-CM | POA: Diagnosis not present

## 2022-07-31 DIAGNOSIS — Z9181 History of falling: Secondary | ICD-10-CM | POA: Diagnosis not present

## 2022-07-31 DIAGNOSIS — F320 Major depressive disorder, single episode, mild: Secondary | ICD-10-CM | POA: Diagnosis not present

## 2022-07-31 DIAGNOSIS — F419 Anxiety disorder, unspecified: Secondary | ICD-10-CM | POA: Diagnosis not present

## 2022-08-01 DIAGNOSIS — J449 Chronic obstructive pulmonary disease, unspecified: Secondary | ICD-10-CM | POA: Diagnosis not present

## 2022-08-01 DIAGNOSIS — J9611 Chronic respiratory failure with hypoxia: Secondary | ICD-10-CM | POA: Diagnosis not present

## 2022-08-01 DIAGNOSIS — J439 Emphysema, unspecified: Secondary | ICD-10-CM | POA: Diagnosis not present

## 2022-08-02 DIAGNOSIS — I10 Essential (primary) hypertension: Secondary | ICD-10-CM | POA: Diagnosis not present

## 2022-08-02 DIAGNOSIS — J449 Chronic obstructive pulmonary disease, unspecified: Secondary | ICD-10-CM | POA: Diagnosis not present

## 2022-08-17 DIAGNOSIS — Z0130 Encounter for examination of blood pressure without abnormal findings: Secondary | ICD-10-CM | POA: Diagnosis not present

## 2022-08-17 DIAGNOSIS — M109 Gout, unspecified: Secondary | ICD-10-CM | POA: Diagnosis not present

## 2022-08-17 DIAGNOSIS — M25432 Effusion, left wrist: Secondary | ICD-10-CM | POA: Diagnosis not present

## 2022-08-19 DIAGNOSIS — I2510 Atherosclerotic heart disease of native coronary artery without angina pectoris: Secondary | ICD-10-CM | POA: Diagnosis not present

## 2022-08-19 DIAGNOSIS — Z0130 Encounter for examination of blood pressure without abnormal findings: Secondary | ICD-10-CM | POA: Diagnosis not present

## 2022-08-19 DIAGNOSIS — M25431 Effusion, right wrist: Secondary | ICD-10-CM | POA: Diagnosis not present

## 2022-08-19 DIAGNOSIS — M25432 Effusion, left wrist: Secondary | ICD-10-CM | POA: Diagnosis not present

## 2022-08-19 DIAGNOSIS — M1990 Unspecified osteoarthritis, unspecified site: Secondary | ICD-10-CM | POA: Diagnosis not present

## 2022-08-19 DIAGNOSIS — J449 Chronic obstructive pulmonary disease, unspecified: Secondary | ICD-10-CM | POA: Diagnosis not present

## 2022-08-19 DIAGNOSIS — G629 Polyneuropathy, unspecified: Secondary | ICD-10-CM | POA: Diagnosis not present

## 2022-08-29 DIAGNOSIS — M7989 Other specified soft tissue disorders: Secondary | ICD-10-CM | POA: Diagnosis not present

## 2022-08-29 DIAGNOSIS — M79642 Pain in left hand: Secondary | ICD-10-CM | POA: Diagnosis not present

## 2022-08-29 DIAGNOSIS — J9611 Chronic respiratory failure with hypoxia: Secondary | ICD-10-CM | POA: Diagnosis not present

## 2022-09-17 DIAGNOSIS — M2550 Pain in unspecified joint: Secondary | ICD-10-CM | POA: Diagnosis not present

## 2022-09-17 DIAGNOSIS — Z23 Encounter for immunization: Secondary | ICD-10-CM | POA: Diagnosis not present

## 2022-09-19 ENCOUNTER — Ambulatory Visit: Payer: PPO | Admitting: Podiatry

## 2022-09-19 ENCOUNTER — Telehealth: Payer: Self-pay | Admitting: *Deleted

## 2022-09-19 DIAGNOSIS — B351 Tinea unguium: Secondary | ICD-10-CM

## 2022-09-19 DIAGNOSIS — M79675 Pain in left toe(s): Secondary | ICD-10-CM | POA: Diagnosis not present

## 2022-09-19 DIAGNOSIS — M79674 Pain in right toe(s): Secondary | ICD-10-CM

## 2022-09-19 NOTE — Telephone Encounter (Signed)
Spoke with patient and explained that the HRCT done 07/04/22 recommended a repeat scan in 3 months to revaluate the area of scarring.  Patient wanted to know if his insurance will cover this. I have sent a message to [facility] to ask if they will have this info. Pt is aware I will call him back.

## 2022-09-19 NOTE — Telephone Encounter (Signed)
PT has an appt for a Low Dose CT and this year he has already had two. Last one was in June. Do we still need to keep this CT appt.? Last CT said no change in Nod's. Please call PT @ [phone] to advise

## 2022-09-19 NOTE — Progress Notes (Signed)
   No chief complaint on file.   SUBJECTIVE Patient presents to office today complaining of elongated, thickened nails that cause pain while ambulating in shoes.  Patient is unable to trim their own nails. Patient is here for further evaluation and treatment.  Past Medical History:  Diagnosis Date   COPD (chronic obstructive pulmonary disease) (HCC)     Allergies  Allergen Reactions   Fluticasone-Salmeterol Shortness Of Breath and Other (See Comments)    "Worsening shortness of breath.."     OBJECTIVE General Patient is awake, alert, and oriented x 3 and in no acute distress. Derm Skin is dry and supple bilateral. Negative open lesions or macerations. Remaining integument unremarkable. Nails are tender, long, thickened and dystrophic with subungual debris, consistent with onychomycosis, 1-5 bilateral. No signs of infection noted. Vasc  DP and PT pedal pulses palpable bilaterally. Temperature gradient within normal limits.  Neuro Epicritic and protective threshold sensation grossly intact bilaterally.  Musculoskeletal Exam No symptomatic pedal deformities noted bilateral. Muscular strength within normal limits.  ASSESSMENT 1.  Pain due to onychomycosis of toenails both  PLAN OF CARE 1. Patient evaluated today.  2. Instructed to maintain good pedal hygiene and foot care.  3. Mechanical debridement of nails 1-5 bilaterally performed using a nail nipper. Filed with dremel without incident.  4. Return to clinic in 3 mos.    Felecia Shelling, DPM [facility]  Dr. Felecia Shelling, DPM    [address] [place], [place] [address]                Office [phone]  Fax [phone]

## 2022-09-21 NOTE — Telephone Encounter (Signed)
Spoke with patient by phone. Advised per [facility] Rhona Leavens that patient would need to contact insurance regarding out of pocket expenses for upcoming LCS LDCT follow up scan.  Patient acknowledged understanding. Patient requested change in time of this appt and appt time was changed.

## 2022-09-29 DIAGNOSIS — E785 Hyperlipidemia, unspecified: Secondary | ICD-10-CM | POA: Diagnosis not present

## 2022-09-29 DIAGNOSIS — F419 Anxiety disorder, unspecified: Secondary | ICD-10-CM | POA: Diagnosis not present

## 2022-09-29 DIAGNOSIS — I700 Atherosclerosis of aorta: Secondary | ICD-10-CM | POA: Diagnosis not present

## 2022-09-29 DIAGNOSIS — I2510 Atherosclerotic heart disease of native coronary artery without angina pectoris: Secondary | ICD-10-CM | POA: Diagnosis not present

## 2022-09-29 DIAGNOSIS — J84115 Respiratory bronchiolitis interstitial lung disease: Secondary | ICD-10-CM | POA: Diagnosis not present

## 2022-09-29 DIAGNOSIS — J439 Emphysema, unspecified: Secondary | ICD-10-CM | POA: Diagnosis not present

## 2022-09-29 DIAGNOSIS — F320 Major depressive disorder, single episode, mild: Secondary | ICD-10-CM | POA: Diagnosis not present

## 2022-09-29 DIAGNOSIS — F17200 Nicotine dependence, unspecified, uncomplicated: Secondary | ICD-10-CM | POA: Diagnosis not present

## 2022-09-29 DIAGNOSIS — F1010 Alcohol abuse, uncomplicated: Secondary | ICD-10-CM | POA: Diagnosis not present

## 2022-09-29 DIAGNOSIS — Z7951 Long term (current) use of inhaled steroids: Secondary | ICD-10-CM | POA: Diagnosis not present

## 2022-09-29 DIAGNOSIS — J9611 Chronic respiratory failure with hypoxia: Secondary | ICD-10-CM | POA: Diagnosis not present

## 2022-09-29 DIAGNOSIS — I119 Hypertensive heart disease without heart failure: Secondary | ICD-10-CM | POA: Diagnosis not present

## 2022-10-04 ENCOUNTER — Ambulatory Visit (HOSPITAL_BASED_OUTPATIENT_CLINIC_OR_DEPARTMENT_OTHER)
Admission: RE | Admit: 2022-10-04 | Discharge: 2022-10-04 | Disposition: A | Discharge status: Home / Self Care | Payer: PPO | Source: Ambulatory Visit | Attending: Acute Care | Admitting: Acute Care

## 2022-10-04 DIAGNOSIS — R911 Solitary pulmonary nodule: Secondary | ICD-10-CM | POA: Insufficient documentation

## 2022-10-04 DIAGNOSIS — Z87891 Personal history of nicotine dependence: Secondary | ICD-10-CM | POA: Diagnosis not present

## 2022-10-18 DIAGNOSIS — E669 Obesity, unspecified: Secondary | ICD-10-CM | POA: Diagnosis not present

## 2022-10-18 DIAGNOSIS — R5382 Chronic fatigue, unspecified: Secondary | ICD-10-CM | POA: Diagnosis not present

## 2022-10-18 DIAGNOSIS — Z6830 Body mass index (BMI) 30.0-30.9, adult: Secondary | ICD-10-CM | POA: Diagnosis not present

## 2022-10-18 DIAGNOSIS — M79641 Pain in right hand: Secondary | ICD-10-CM | POA: Diagnosis not present

## 2022-10-18 DIAGNOSIS — M79642 Pain in left hand: Secondary | ICD-10-CM | POA: Diagnosis not present

## 2022-10-19 ENCOUNTER — Telehealth: Payer: Self-pay | Admitting: Acute Care

## 2022-10-19 NOTE — Telephone Encounter (Signed)
Called and spoke to pt regarding LDCT results from 10/04/22. Patient had a June 2024 LDCT scan resulting as a 4B. Patients Sept scan resulted as a LR 2 with resolution of suspicious nodule. Informed patient of this information. Patient has quit smoking in 2007 making patient ineligible for future LCS scans due to him having quit smoking more than 15 years ago. Patient is aware of this and can speak with his pulmonologist and PCP about future CT chest scans if they feel it necessary to follow up on current nodules. Will forward to PCP and Dr. Vassie Loll.    IMPRESSION: 1. Lung-RADS 2, benign appearance or behavior. Continue annual screening with low-dose chest CT without contrast in 12 months. 2. Aortic atherosclerosis (ICD10-I70.0). Coronary artery calcification. 3.  Emphysema (ICD10-J43.9).     Electronically Signed   By: Leanna Battles M.D.   On: 10/19/2022 14:14

## 2022-11-01 DIAGNOSIS — R5382 Chronic fatigue, unspecified: Secondary | ICD-10-CM | POA: Diagnosis not present

## 2022-11-01 DIAGNOSIS — M79641 Pain in right hand: Secondary | ICD-10-CM | POA: Diagnosis not present

## 2022-11-01 DIAGNOSIS — Z6830 Body mass index (BMI) 30.0-30.9, adult: Secondary | ICD-10-CM | POA: Diagnosis not present

## 2022-11-01 DIAGNOSIS — E669 Obesity, unspecified: Secondary | ICD-10-CM | POA: Diagnosis not present

## 2022-11-01 DIAGNOSIS — M79642 Pain in left hand: Secondary | ICD-10-CM | POA: Diagnosis not present

## 2022-11-01 DIAGNOSIS — M0579 Rheumatoid arthritis with rheumatoid factor of multiple sites without organ or systems involvement: Secondary | ICD-10-CM | POA: Diagnosis not present

## 2022-11-06 ENCOUNTER — Ambulatory Visit: Payer: PPO | Admitting: Primary Care

## 2022-11-06 ENCOUNTER — Encounter: Payer: Self-pay | Admitting: Primary Care

## 2022-11-06 VITALS — BP 134/73 | HR 105 | Ht 71.0 in | Wt 215.6 lb

## 2022-11-06 DIAGNOSIS — R918 Other nonspecific abnormal finding of lung field: Secondary | ICD-10-CM | POA: Diagnosis not present

## 2022-11-06 DIAGNOSIS — J9611 Chronic respiratory failure with hypoxia: Secondary | ICD-10-CM

## 2022-11-06 NOTE — Patient Instructions (Addendum)
LDCT in September 2024 for annual lung cancer screening showed lung RADS 2;  Pulmonary nodules appear stable measuring 4.49mm or less. centrilobular and paraseptal emphysema. Scarring posterior RUL.   Recommendations: Continue Breztri Aerosphere two puffs twice daily Continue 3L oxygen  with exertion and 4L with exertion   Orders: Paper work for patient assistance for [employer] oximetry on 3L   Follow-up 3 months with Dr. Vassie Loll (at [facility] location)  COPD and Physical Activity Chronic obstructive pulmonary disease (COPD) is a long-term, or chronic, condition that affects the lungs. COPD is a general term that can be used to describe many problems that cause inflammation of the lungs and limit airflow. These conditions include chronic bronchitis and emphysema. The main symptom of COPD is shortness of breath, which makes it harder to do even simple tasks. This can also make it harder to exercise and stay active. Talk with your health care provider about treatments to help you breathe better and actions you can take to prevent breathing problems during physical activity. What are the benefits of exercising when you have COPD? Exercising regularly is an important part of a healthy lifestyle. You can still exercise and do physical activities even though you have COPD. Exercise and physical activity improve your shortness of breath by increasing blood flow (circulation). This causes your heart to pump more oxygen through your body. Moderate exercise can: Improve oxygen use. Increase your energy level. Help with shortness of breath. Strengthen your breathing muscles. Improve heart health. Help with sleep. Improve your self-esteem and feelings of self-worth. Lower depression, stress, and anxiety. Exercise can benefit everyone with COPD. The severity of your disease may affect how hard you can exercise, especially at first, but everyone can benefit. Talk with your health care  provider about how much exercise is safe for you, and which activities and exercises are safe for you. What actions can I take to prevent breathing problems during physical activity? Sign up for a pulmonary rehabilitation program. This type of program may include: Education about lung diseases. Exercise classes that teach you how to exercise and be more active while improving your breathing. This usually involves: Exercise using your lower extremities, such as a stationary bicycle. About 30 minutes of exercise, 2 to 5 times per week, for 6 to 12 weeks. Strength training, such as push-ups or leg lifts. Nutrition education. Group classes in which you can talk with others who also have COPD and learn ways to manage stress. If you use an oxygen tank, you should use it while you exercise. Work with your health care provider to adjust your oxygen for your physical activity. Your resting flow rate is different from your flow rate during physical activity. How to manage your breathing while exercising While you are exercising: Take slow breaths. Pace yourself, and do nottry to go too fast. Purse your lips while breathing out. Pursing your lips is similar to a kissing or whistling position. If doing exercise that uses a quick burst of effort, such as weight lifting: Breathe in before starting the exercise. Breathe out during the hardest part of the exercise, such as raising the weights. Where to find support You can find support for exercising with COPD from: Your health care provider. A pulmonary rehabilitation program. Your local health department or community health programs. Support groups, either online or in-person. Your health care provider may be able to recommend support groups. Where to find more information You can find more information about exercising with COPD  from: American Lung Association: lung.org COPD Foundation: copdfoundation.org Contact a health care provider if: Your  symptoms get worse. You have nausea. You have a fever. You want to start a new exercise program or a new activity. Get help right away if: You have chest pain. You cannot breathe. These symptoms may represent a serious problem that is an emergency. Do not wait to see if the symptoms will go away. Get medical help right away. Call your local emergency services (911 in the [place] Do not drive yourself to the hospital. Summary COPD is a general term that can be used to describe many different lung problems that cause lung inflammation and limit airflow. This includes chronic bronchitis and emphysema. Exercise and physical activity improve your shortness of breath by increasing blood flow (circulation). This causes your heart to provide more oxygen to your body. Contact your health care provider before starting any exercise program or new activity. Ask your health care provider what exercises and activities are safe for you. This information is not intended to replace advice given to you by your health care provider. Make sure you discuss any questions you have with your health care provider. Document Revised: 11/03/2019 Document Reviewed: 11/03/2019 Elsevier Patient Education  2024 [employer].

## 2022-11-07 ENCOUNTER — Telehealth: Payer: Self-pay | Admitting: Primary Care

## 2022-11-09 NOTE — Telephone Encounter (Signed)
Pt calling back about the prescription

## 2022-11-10 NOTE — Telephone Encounter (Signed)
Beth, please advise on this for pt. Patient has called the office multiple times checking on Rx.

## 2022-11-12 DIAGNOSIS — M069 Rheumatoid arthritis, unspecified: Secondary | ICD-10-CM | POA: Insufficient documentation

## 2022-11-12 DIAGNOSIS — R918 Other nonspecific abnormal finding of lung field: Secondary | ICD-10-CM | POA: Insufficient documentation

## 2022-11-12 HISTORY — DX: Rheumatoid arthritis, unspecified: M06.9

## 2022-11-12 MED ORDER — HYDROXYZINE HCL 25 MG PO TABS: 25.0000 mg | ORAL_TABLET | Freq: Three times a day (TID) | ORAL | 1 refills | Status: DC | PRN

## 2022-11-12 NOTE — Assessment & Plan Note (Signed)
-   New dx. Following with rheumatology. Started on Methotrexate 15 mg once a week with folic acid.

## 2022-11-12 NOTE — Assessment & Plan Note (Signed)
-   Stable; Not acutely exacerbated. He gets out of breath easily. - Continue Breztri Aerosphere 2 puffs twice daily (reapplying for patient assistance).

## 2022-11-12 NOTE — Assessment & Plan Note (Signed)
-   Improved; Patient had an abnormal LDCT scan in June 2024, he was admitted on 6/26 for COPD exacerbation.  Inpatient HRCT imaging showed no definite evidence of ILD, probable chronic postinfectious or inflammatory scarring right upper lobe.  He had a repeat LDCT in September 2024 which was read as RADS 2, pulmonary nodules appear stable measuring 4.48mm or less. centrilobular and paraseptal emphysema. Scarring posterior RUL. Continue routine follow-up per lung cancer screening recommendations.

## 2022-11-12 NOTE — Assessment & Plan Note (Signed)
-   Continue 3L oxygen at rest and 4L with exertion

## 2022-11-12 NOTE — Telephone Encounter (Signed)
Sent RX, please apologize to patient on my behalf that I did not send. Should be able to pick up tomorrow

## 2022-11-14 MED ORDER — HYDROXYZINE HCL 25 MG PO TABS: 25.0000 mg | ORAL_TABLET | Freq: Three times a day (TID) | ORAL | 1 refills | Status: DC | PRN

## 2022-11-14 NOTE — Addendum Note (Signed)
Addended by: Janean Sark on: 11/14/2022 09:39 AM   Modules accepted: Orders

## 2022-11-14 NOTE — Telephone Encounter (Signed)
Patient informed and he has not received call from [facility].He will call and check status.

## 2022-11-23 ENCOUNTER — Other Ambulatory Visit: Payer: Self-pay | Admitting: Primary Care

## 2022-11-23 NOTE — Telephone Encounter (Signed)
Please advise on refill request

## 2022-12-13 DIAGNOSIS — M79641 Pain in right hand: Secondary | ICD-10-CM | POA: Diagnosis not present

## 2022-12-13 DIAGNOSIS — E669 Obesity, unspecified: Secondary | ICD-10-CM | POA: Diagnosis not present

## 2022-12-13 DIAGNOSIS — M79642 Pain in left hand: Secondary | ICD-10-CM | POA: Diagnosis not present

## 2022-12-13 DIAGNOSIS — Z6830 Body mass index (BMI) 30.0-30.9, adult: Secondary | ICD-10-CM | POA: Diagnosis not present

## 2022-12-13 DIAGNOSIS — R5382 Chronic fatigue, unspecified: Secondary | ICD-10-CM | POA: Diagnosis not present

## 2022-12-13 DIAGNOSIS — M0579 Rheumatoid arthritis with rheumatoid factor of multiple sites without organ or systems involvement: Secondary | ICD-10-CM | POA: Diagnosis not present

## 2022-12-13 DIAGNOSIS — M1991 Primary osteoarthritis, unspecified site: Secondary | ICD-10-CM | POA: Diagnosis not present

## 2022-12-26 DIAGNOSIS — Z23 Encounter for immunization: Secondary | ICD-10-CM | POA: Diagnosis not present

## 2022-12-26 DIAGNOSIS — J9611 Chronic respiratory failure with hypoxia: Secondary | ICD-10-CM | POA: Diagnosis not present

## 2022-12-26 DIAGNOSIS — E7800 Pure hypercholesterolemia, unspecified: Secondary | ICD-10-CM | POA: Diagnosis not present

## 2022-12-26 DIAGNOSIS — R7301 Impaired fasting glucose: Secondary | ICD-10-CM | POA: Diagnosis not present

## 2022-12-26 DIAGNOSIS — Z0000 Encounter for general adult medical examination without abnormal findings: Secondary | ICD-10-CM | POA: Diagnosis not present

## 2022-12-26 DIAGNOSIS — Z79899 Other long term (current) drug therapy: Secondary | ICD-10-CM | POA: Diagnosis not present

## 2022-12-26 DIAGNOSIS — M0520 Rheumatoid vasculitis with rheumatoid arthritis of unspecified site: Secondary | ICD-10-CM | POA: Diagnosis not present

## 2022-12-26 DIAGNOSIS — F1010 Alcohol abuse, uncomplicated: Secondary | ICD-10-CM | POA: Diagnosis not present

## 2022-12-26 DIAGNOSIS — J439 Emphysema, unspecified: Secondary | ICD-10-CM | POA: Diagnosis not present

## 2022-12-26 DIAGNOSIS — F419 Anxiety disorder, unspecified: Secondary | ICD-10-CM | POA: Diagnosis not present

## 2023-01-14 DIAGNOSIS — M0579 Rheumatoid arthritis with rheumatoid factor of multiple sites without organ or systems involvement: Secondary | ICD-10-CM | POA: Diagnosis not present

## 2023-01-16 ENCOUNTER — Telehealth: Payer: Self-pay | Admitting: Primary Care

## 2023-01-16 NOTE — Telephone Encounter (Signed)
 Patient dropped off Breztri patient assistance forms. Put in [place] NP box. Patient phone number is [phone].

## 2023-01-19 NOTE — Telephone Encounter (Signed)
 Routing encounter to [place] as an FYI in case this has not been taken care of yet for pt.

## 2023-01-21 NOTE — Telephone Encounter (Signed)
 Not in my folder, I think it has been completed Cc: Pharmacy

## 2023-01-23 NOTE — Telephone Encounter (Signed)
 Pharmacy team does not have this form. Inhaler PAP would have been placed in provider box. Please advise on status. Thanks!  Geraldene Kleine, PharmD, MPH, BCPS, CPP Clinical Pharmacist (Rheumatology and Pulmonology)

## 2023-02-04 ENCOUNTER — Other Ambulatory Visit: Payer: Self-pay

## 2023-02-04 MED ORDER — BREZTRI AEROSPHERE 160-9-4.8 MCG/ACT IN AERO: 2.0000 | INHALATION_SPRAY | Freq: Two times a day (BID) | RESPIRATORY_TRACT | 0 refills | Status: DC

## 2023-02-05 NOTE — Telephone Encounter (Signed)
Called patient He has been approved for [employer] pt assistance. He was informed last Friday and was told they would send request for provider to send rx. Closing message.

## 2023-02-06 ENCOUNTER — Encounter (HOSPITAL_BASED_OUTPATIENT_CLINIC_OR_DEPARTMENT_OTHER): Payer: Self-pay | Admitting: Pulmonary Disease

## 2023-02-06 ENCOUNTER — Ambulatory Visit (HOSPITAL_BASED_OUTPATIENT_CLINIC_OR_DEPARTMENT_OTHER): Payer: PPO | Admitting: Pulmonary Disease

## 2023-02-06 VITALS — BP 134/62 | HR 91 | Temp 97.5°F | Ht 71.0 in | Wt 209.2 lb

## 2023-02-06 DIAGNOSIS — M069 Rheumatoid arthritis, unspecified: Secondary | ICD-10-CM

## 2023-02-06 DIAGNOSIS — J9611 Chronic respiratory failure with hypoxia: Secondary | ICD-10-CM

## 2023-02-06 DIAGNOSIS — J439 Emphysema, unspecified: Secondary | ICD-10-CM

## 2023-02-06 DIAGNOSIS — J4489 Other specified chronic obstructive pulmonary disease: Secondary | ICD-10-CM

## 2023-02-06 MED ORDER — BREZTRI AEROSPHERE 160-9-4.8 MCG/ACT IN AERO: 2.0000 | INHALATION_SPRAY | Freq: Two times a day (BID) | RESPIRATORY_TRACT | 3 refills | Status: DC

## 2023-02-06 MED ORDER — PREDNISONE 10 MG PO TABS: ORAL_TABLET | ORAL | 0 refills | Status: DC

## 2023-02-06 NOTE — Patient Instructions (Addendum)
Prednisone 10 mg tabs  Take 2 tabs daily with food x 5ds, then 1 tab daily with food x 5ds then STOP  Rx for Breztri to [place] and me

## 2023-02-06 NOTE — Progress Notes (Signed)
Subjective:    Patient ID: [name] [name], male    DOB: 21-Apr-1945, 78 y.o.   MRN: [id number]  HPI  78 yo heavy ex-smoker  For FU of  COPD. He smoked about a pack per day  until he quit in 2010- about 40 pack years. -on portable oxygen 03/2019 -uses 2 L continuous at home and  POC 3 to 4 L when he goes out -completed pulmonary rehab 2020   LDCT in June 2024 which was read as lung RADS 4B, new patchy and nodule consolidative opacity RUL. Dominant nodule component 9.49mm.   LDCT in September 2024 for annual lung cancer screening showed lung RADS 2; Pulmonary nodules appear stable measuring 4.74mm or less. centrilobular and paraseptal emphysema.   PMH : new diagnosis rheumatoid arthritis. Started on methotrexate 15mg  once a week   Chief Complaint  Patient presents with   Follow-up    RA is flaring up.  Having lower SaO2 with exertion, around low 80's    Discussed the use of AI scribe software for clinical note transcription with the patient, who gave verbal consent to proceed.  History of Present Illness   The patient, with a history of severe COPD on 2-4 liters of oxygen, presents with a new diagnosis of rheumatoid arthritis. They were diagnosed with rheumatoid arthritis after a hospitalization for COPD exacerbation. The patient reports that the arthritis symptoms began shortly after their hospital discharge, with the most affected joints being the ankles and shoulders. They describe the joints as stiff and difficult to move, but not painful. The patient has been on methotrexate for the arthritis, initially in pill form, but recently switched to weekly injections due to poor absorption of the pills.  The patient's COPD has been stable, with no recent cough or sputum production. However, they report decreased exercise tolerance due to the arthritis, and sometimes struggle to maintain their oxygen levels, particularly in the mornings. They use 3-4 liters of oxygen during activities and 2  liters at rest. The patient uses Breztri as their main inhaler and reports good control of their COPD symptoms with this medication.  The patient also mentions a recent weight loss and a concern about scar tissue seen on a recent scan, which they believe is related to a previous bronchitis infection.        Significant tests/ events reviewed   07/2020 ONO /RA -desaturation less than 88% for 23 minutes   HRCT chest 06/2022 no ILD CTA 06/2021 >> Upper lobe predominant emphysema and Scarring.Multiple small calcified and noncalcified pulmonary nodules are unchanged     LDCT 07/05/20 showed lung RADS 2. Centrilobular and paraseptal emphysema , unchanged nodules compared to 2020   PFTs 09/2012 moderate reversible airway obstruction, ratio 49, FEV1 44%, improved to 56%/25% bronchodilator response, TLC 88%, DLCO 55%   Spirometry 03/18/2018 showed ratio of 61, FEV1 of 43% and FVC of 52%  Review of Systems neg for any significant sore throat, dysphagia, itching, sneezing, nasal congestion or excess/ purulent secretions, fever, chills, sweats, unintended wt loss, pleuritic or exertional cp, hempoptysis, orthopnea pnd or change in chronic leg swelling. Also denies presyncope, palpitations, heartburn, abdominal pain, nausea, vomiting, diarrhea or change in bowel or urinary habits, dysuria,hematuria, rash, arthralgias, visual complaints, headache, numbness weakness or ataxia.     Objective:   Physical Exam  Gen. 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 Reports well-managed breathing but decreased exercise tolerance due to rheumatoid arthritis. No current cough or significant wheezing. Lungs clear. Vital signs stable. Discussed Breztri inhaler efficacy comparable to nebulizers when used properly. - Prescribe prednisone: 20 mg daily for 5 days, reassess, then 10 mg daily for another 5 days if no improvement. - Write a new prescription for prednisone for future flare-ups. - Continue Breztri inhaler as prescribed.  Rheumatoid Arthritis New diagnosis, managed with weekly methotrexate injections due to poor oral absorption. Reports stiffness and difficulty moving, particularly in ankles and shoulders. Under rheumatologist care with follow-up scheduled. Discussed potential delay in finding optimal medication regimen. Prefers to avoid prednisone unless necessary. - Continue weekly methotrexate injections. - Follow up with rheumatologist on Monday. - Monitor joint symptoms for improvement with current treatment.  General Health Maintenance Reports weight loss and lung scar tissue. Engages in home exercises and has a [employer]. Discussed challenges accessing pulmonary rehab due to mobility issues. - Encourage continued physical activity as tolerated. - Consider re-enrolling in pulmonary rehab if feasible.  Follow-up - Follow up with rheumatologist in 6 weeks. - Reassess COPD and rheumatoid arthritis symptoms at next visit.

## 2023-02-11 ENCOUNTER — Other Ambulatory Visit (HOSPITAL_BASED_OUTPATIENT_CLINIC_OR_DEPARTMENT_OTHER): Payer: Self-pay

## 2023-02-11 DIAGNOSIS — M0579 Rheumatoid arthritis with rheumatoid factor of multiple sites without organ or systems involvement: Secondary | ICD-10-CM | POA: Diagnosis not present

## 2023-02-11 MED ORDER — BREZTRI AEROSPHERE 160-9-4.8 MCG/ACT IN AERO: 2.0000 | INHALATION_SPRAY | Freq: Two times a day (BID) | RESPIRATORY_TRACT | 3 refills | Status: DC

## 2023-02-11 NOTE — Addendum Note (Signed)
Addended by: Gay Filler T on: 02/11/2023 03:06 PM   Modules accepted: Orders

## 2023-03-13 ENCOUNTER — Ambulatory Visit: Payer: PPO | Attending: Cardiovascular Disease | Admitting: Cardiovascular Disease

## 2023-03-14 ENCOUNTER — Encounter: Payer: PPO | Admitting: Internal Medicine

## 2023-03-20 ENCOUNTER — Other Ambulatory Visit (HOSPITAL_BASED_OUTPATIENT_CLINIC_OR_DEPARTMENT_OTHER): Payer: Self-pay

## 2023-03-20 ENCOUNTER — Emergency Department (HOSPITAL_BASED_OUTPATIENT_CLINIC_OR_DEPARTMENT_OTHER): Admitting: Radiology

## 2023-03-20 ENCOUNTER — Other Ambulatory Visit: Payer: Self-pay

## 2023-03-20 ENCOUNTER — Emergency Department (HOSPITAL_BASED_OUTPATIENT_CLINIC_OR_DEPARTMENT_OTHER)
Admission: EM | Admit: 2023-03-20 | Discharge: 2023-03-20 | Disposition: A | Discharge status: Home / Self Care | Source: Home / Self Care | Attending: Emergency Medicine | Admitting: Emergency Medicine

## 2023-03-20 ENCOUNTER — Encounter (HOSPITAL_BASED_OUTPATIENT_CLINIC_OR_DEPARTMENT_OTHER): Payer: Self-pay | Admitting: Emergency Medicine

## 2023-03-20 DIAGNOSIS — J9811 Atelectasis: Secondary | ICD-10-CM | POA: Diagnosis not present

## 2023-03-20 DIAGNOSIS — J90 Pleural effusion, not elsewhere classified: Secondary | ICD-10-CM | POA: Diagnosis not present

## 2023-03-20 DIAGNOSIS — S2232XA Fracture of one rib, left side, initial encounter for closed fracture: Secondary | ICD-10-CM | POA: Insufficient documentation

## 2023-03-20 DIAGNOSIS — W01190A Fall on same level from slipping, tripping and stumbling with subsequent striking against furniture, initial encounter: Secondary | ICD-10-CM | POA: Insufficient documentation

## 2023-03-20 DIAGNOSIS — J449 Chronic obstructive pulmonary disease, unspecified: Secondary | ICD-10-CM | POA: Insufficient documentation

## 2023-03-20 DIAGNOSIS — I10 Essential (primary) hypertension: Secondary | ICD-10-CM | POA: Insufficient documentation

## 2023-03-20 DIAGNOSIS — J9621 Acute and chronic respiratory failure with hypoxia: Secondary | ICD-10-CM | POA: Diagnosis not present

## 2023-03-20 DIAGNOSIS — S2242XA Multiple fractures of ribs, left side, initial encounter for closed fracture: Secondary | ICD-10-CM | POA: Diagnosis not present

## 2023-03-20 HISTORY — DX: Pure hypercholesterolemia, unspecified: E78.00

## 2023-03-20 HISTORY — DX: Essential (primary) hypertension: I10

## 2023-03-20 MED ORDER — LIDOCAINE 5 % EX PTCH
2.0000 | MEDICATED_PATCH | CUTANEOUS | Status: DC
  Administered 2023-03-20: 2 via TRANSDERMAL
  Filled 2023-03-20: qty 2

## 2023-03-20 MED ORDER — OXYCODONE HCL 5 MG PO TABS
5.0000 mg | ORAL_TABLET | ORAL | 0 refills | Status: DC | PRN
  Filled 2023-03-20: qty 20, 4d supply, fill #0

## 2023-03-20 MED ORDER — OXYCODONE HCL 5 MG PO TABS: 5.0000 mg | ORAL_TABLET | ORAL | 0 refills | Status: DC | PRN

## 2023-03-20 MED ORDER — HYDROCODONE-ACETAMINOPHEN 5-325 MG PO TABS
1.0000 | ORAL_TABLET | ORAL | Status: AC
  Administered 2023-03-20: 1 via ORAL
  Filled 2023-03-20: qty 1

## 2023-03-20 NOTE — ED Triage Notes (Addendum)
 Pt endorses mechanical fall x 2 days pta after trip on o2 tubing. Pt endorses LT side rib pain, reports feeling a "pop every now and then". O2 baseline 3L at rest, 4L with exertion. Pt add that he has skin tear to LT wrist. Bandage applied pta

## 2023-03-20 NOTE — ED Provider Notes (Signed)
 [facility] Provider Note   CSN: [id number] Arrival date & time: 03/20/23  1018     History  Chief Complaint  Patient presents with   Franz Svec is a 78 y.o. male.  78 year old male with a history of COPD on 3 L home oxygen, hypertension, and hyperlipidemia presents emergency department with left-sided rib pain.  Patient reports that he tripped over his oxygen tubing 2 days ago and his left ribs hit the couch.  No head strike or LOC.  No other injuries.  Says he has been having some pain but it has been responding to Tylenol so he did not decide to come in until today because last night he started feeling some shifting in his chest and clicking.  Denies any fever.  No cough.  No shortness of breath aside from the pain when he breathes in.       Home Medications Prior to Admission medications   Medication Sig Start Date End Date Taking? Authorizing Provider  sertraline (ZOLOFT) 100 MG tablet Take 100 mg by mouth daily. 02/22/23  Yes [provider]  albuterol (VENTOLIN HFA) 108 (90 Base) MCG/ACT inhaler Inhale 2 puffs into the lungs every 6 (six) hours as needed for wheezing or shortness of breath. Patient taking differently: Inhale 2 puffs into the lungs See admin instructions. Inhale 2 puffs into the lungs every 4-6 hours as needed for shortness of breath or wheezing 12/08/21   Oretha Milch, MD  ALPRAZolam Prudy Feeler) 0.25 MG tablet Take 0.25 mg by mouth at bedtime as needed.    [provider]  amLODipine (NORVASC) 5 MG tablet Take 5 mg by mouth daily. 11/23/22   [provider]  atorvastatin (LIPITOR) 20 MG tablet Take 20 mg by mouth daily. 07/01/22   [provider]  Budeson-Glycopyrrol-Formoterol (BREZTRI AEROSPHERE) 160-9-4.8 MCG/ACT AERO Inhale 2 puffs into the lungs in the morning and at bedtime. 02/11/23   Glenford Bayley, NP  folic acid (FOLVITE) 1 MG tablet Take 1 mg by mouth  daily. 11/01/22   [provider]  furosemide (LASIX) 40 MG tablet Take 40 mg by mouth in the morning. 03/06/22   [provider]  hydrOXYzine (ATARAX) 25 MG tablet TAKE 1 TABLET BY MOUTH EVERY 8 HOURS AS NEEDED FOR ANXIETY. 11/23/22   Glenford Bayley, NP  ipratropium-albuterol (DUONEB) 0.5-2.5 (3) MG/3ML SOLN Take 3 mLs by nebulization every 4 (four) hours as needed (shortness of breath or wheezing). Patient not taking: Reported on 02/06/2023 07/09/22   Marinda Elk, MD  methotrexate 50 MG/2ML injection Inject 20 mg into the skin once a week.    [provider]  Multiple Vitamins-Minerals (MULTIVITAMIN WITH MINERALS) tablet Take 1 tablet by mouth daily with breakfast.    [provider]  oxyCODONE (ROXICODONE) 5 MG immediate release tablet Take 1 tablet (5 mg total) by mouth every 4 (four) hours as needed for severe pain (pain score 7-10). 03/20/23   Rondel Baton, MD  OXYGEN Inhale 3 L/min into the lungs continuous.    [provider]  polyethylene glycol (MIRALAX / GLYCOLAX) 17 g packet Take 17 g by mouth daily as needed for mild constipation. 07/09/22   Shalhoub, Deno Lunger, MD  polyethylene glycol (MIRALAX / GLYCOLAX) 17 g packet Take 17 g by mouth daily. 07/10/22   Shalhoub, Deno Lunger, MD  Potassium Chloride ER 20 MEQ TBCR Take 20 mEq by mouth daily. 02/02/22  [provider]  predniSONE (DELTASONE) 10 MG tablet Take 2 tabs daily with food x 5ds, then 1 tab daily with food x 5ds then STOP 02/06/23   Oretha Milch, MD  senna (SENOKOT) 8.6 MG TABS tablet Take 2 tablets (17.2 mg total) by mouth at bedtime. 07/09/22   Shalhoub, Deno Lunger, MD  sertraline (ZOLOFT) 50 MG tablet Take 100 mg by mouth in the morning. 05/30/22   [provider]  vitamin B-12 (CYANOCOBALAMIN) 1000 MCG tablet Take 1,000 mcg by mouth daily.    [provider]      Allergies    Fluticasone-salmeterol    Review of Systems   Review of  Systems  Physical Exam Updated Vital Signs BP (!) 154/67   Pulse 85   Temp 98.2 F (36.8 C)   Resp 20   SpO2 96%  Physical Exam Constitutional:      General: He is not in acute distress.    Appearance: Normal appearance. He is not ill-appearing.     Comments: On 3 L nasal cannula.  Appears comfortable until he attempts to move his torso and then has some mild splinting.  HENT:     Head: Normocephalic and atraumatic.     Right Ear: External ear normal.     Left Ear: External ear normal.     Mouth/Throat:     Mouth: Mucous membranes are moist.     Pharynx: Oropharynx is clear.  Eyes:     Extraocular Movements: Extraocular movements intact.     Conjunctiva/sclera: Conjunctivae normal.     Pupils: Pupils are equal, round, and reactive to light.  Neck:     Comments: No C-spine midline tenderness to palpation Cardiovascular:     Rate and Rhythm: Normal rate and regular rhythm.     Pulses: Normal pulses.     Heart sounds: Normal heart sounds.  Pulmonary:     Effort: Pulmonary effort is normal. No respiratory distress.     Breath sounds: Normal breath sounds.  Abdominal:     General: Abdomen is flat. There is no distension.     Palpations: Abdomen is soft. There is no mass.     Tenderness: There is no abdominal tenderness. There is no guarding.  Musculoskeletal:        General: No deformity. Normal range of motion.     Cervical back: No rigidity or tenderness.     Comments: No tenderness to palpation of midline thoracic or lumbar spine.  No step-offs palpated.  Tenderness to palpation of left chest wall.  No tenderness to palpation of bilateral clavicles.  No tenderness to palpation, bruising, or deformities noted of bilateral shoulders, elbows, wrists, hips, knees, or ankles.  Neurological:     Mental Status: He is alert and oriented to person, place, and time. Mental status is at baseline.     ED Results / Procedures / Treatments   Labs (all labs ordered are listed,  but only abnormal results are displayed) Labs Reviewed - No data to display  EKG None  Radiology DG Ribs Unilateral W/Chest Left Result Date: 03/20/2023 CLINICAL DATA:  Left rib pain after fall 2 days ago. EXAM: LEFT RIBS AND CHEST - 3+ VIEW COMPARISON:  July 04, 2022. FINDINGS: Minimally displaced fracture is seen involving the left eighth rib laterally. Minimal left basilar subsegmental atelectasis is noted with small pleural effusion. Stable cardiomediastinal silhouette. IMPRESSION: Minimally displaced left eighth rib fracture. Minimal left basilar subsegmental atelectasis with small left pleural effusion. Electronically  Signed   By: Lupita Raider M.D.   On: 03/20/2023 13:10    Procedures Procedures    Medications Ordered in ED Medications  lidocaine (LIDODERM) 5 % 2 patch (2 patches Transdermal Patch Applied 03/20/23 1205)  HYDROcodone-acetaminophen (NORCO/VICODIN) 5-325 MG per tablet 1 tablet (1 tablet Oral Given 03/20/23 1206)    ED Course/ Medical Decision Making/ A&P                                 Medical Decision Making Amount and/or Complexity of Data Reviewed Radiology: ordered.  Risk Prescription drug management.   Lamoine Magallon Barbara is a 78 y.o. male with comorbidities that complicate the patient evaluation including COPD on 3 L home oxygen, hypertension, and hyperlipidemia presents emergency department with left-sided rib pain.   Initial Ddx:  Bruised rib, rib fracture, pneumonia, pneumothorax  MDM:  The patient presents to the emergency department with rib pain after trauma.  On exam does appear to be uncomfortable but is satting well on his home oxygen.  Concerned about possible bruised rib or rib fractures will obtain x-rays at this time.  Plan:  X-ray Lidocaine patch P.o. pain medication  ED Summary/Re-evaluation:  Patient had x-rays which showed that they had a fractured left rib.  No pneumothorax or pneumonia.  Pain under control here in the  emergency department.  Given his comorbidities and pain did offer the patient admission at this time but he stated that he preferred to go home with pain medication.  Given an incentive spirometer and instructed to follow-up with your primary doctor in several days along with a prescription of oxycodone and instructions to take Tylenol with it.  Counseled the patient extensively on complications look out for including symptoms of pneumonia.  This patient presents to the ED for concern of complaints listed in HPI, this involves an extensive number of treatment options, and is a complaint that carries with it a high risk of complications and morbidity. Disposition including potential need for admission considered.   Dispo: DC Home. Return precautions discussed including, but not limited to, those listed in the AVS. Allowed pt time to ask questions which were answered fully prior to dc.  Additional history obtained from daughter Records reviewed Outpatient Clinic Notes I independently reviewed the following imaging with scope of interpretation limited to determining acute life threatening conditions related to emergency care: Chest x-ray and agree with the radiologist interpretation with the following exceptions: none I personally reviewed and interpreted cardiac monitoring: normal sinus rhythm  I personally reviewed and interpreted the pt's EKG: see above for interpretation  I have reviewed the patients home medications and made adjustments as needed Social Determinants of health:  Geriatric   Final Clinical Impression(s) / ED Diagnoses Final diagnoses:  Closed fracture of one rib of left side, initial encounter    Rx / DC Orders ED Discharge Orders          Ordered    oxyCODONE (ROXICODONE) 5 MG immediate release tablet  Every 4 hours PRN,   Status:  Discontinued        03/20/23 1314    oxyCODONE (ROXICODONE) 5 MG immediate release tablet  Every 4 hours PRN        03/20/23 1318               Rondel Baton, MD 03/20/23 1444

## 2023-03-20 NOTE — ED Notes (Signed)
 Pt declined LUE wrist XR

## 2023-03-20 NOTE — ED Notes (Signed)
 Pt placed back on home oxygen tank and taken out in wheelchair. Pt verbalized understanding of d/c instructions and had no further questions at time of discharge. Pt CA&Ox4, in NAD at time of d/c. Pt verbalized he is not driving and family member is on the way to pick him up.

## 2023-03-20 NOTE — Discharge Instructions (Addendum)
 You were seen for your rib pain in the emergency department.  You broke your eighth rib.  At home, please take Tylenol and use lidocaine patches we have prescribed you for your pain. You may also take the oxycodone we have prescribed you for any breakthrough pain that may have.  Do not take this before driving or operating heavy machinery.  Do not take this medication with alcohol. Please also use the incentive spirometer we have given you to prevent any pneumonias.  Check your MyChart online for the results of any tests that had not resulted by the time you left the emergency department.   Follow-up with your primary doctor in 2-3 days regarding your visit.    Return immediately to the emergency department if you experience any of the following: Difficulty breathing, fever, severe pain, or any other concerning symptoms.    Thank you for visiting our Emergency Department. It was a pleasure taking care of you today.

## 2023-03-22 ENCOUNTER — Other Ambulatory Visit: Payer: Self-pay

## 2023-03-22 ENCOUNTER — Encounter (HOSPITAL_BASED_OUTPATIENT_CLINIC_OR_DEPARTMENT_OTHER): Payer: Self-pay

## 2023-03-22 ENCOUNTER — Emergency Department (HOSPITAL_BASED_OUTPATIENT_CLINIC_OR_DEPARTMENT_OTHER)

## 2023-03-22 ENCOUNTER — Inpatient Hospital Stay (HOSPITAL_BASED_OUTPATIENT_CLINIC_OR_DEPARTMENT_OTHER)
Admission: EM | Admit: 2023-03-22 | Discharge: 2023-03-24 | DRG: 189 | Disposition: A | Discharge status: Home Health | Attending: Family Medicine | Admitting: Family Medicine

## 2023-03-22 DIAGNOSIS — F1020 Alcohol dependence, uncomplicated: Secondary | ICD-10-CM | POA: Diagnosis not present

## 2023-03-22 DIAGNOSIS — S2249XA Multiple fractures of ribs, unspecified side, initial encounter for closed fracture: Secondary | ICD-10-CM | POA: Diagnosis not present

## 2023-03-22 DIAGNOSIS — K5900 Constipation, unspecified: Secondary | ICD-10-CM | POA: Diagnosis present

## 2023-03-22 DIAGNOSIS — Z79631 Long term (current) use of antimetabolite agent: Secondary | ICD-10-CM

## 2023-03-22 DIAGNOSIS — Z87891 Personal history of nicotine dependence: Secondary | ICD-10-CM | POA: Diagnosis not present

## 2023-03-22 DIAGNOSIS — Z79899 Other long term (current) drug therapy: Secondary | ICD-10-CM

## 2023-03-22 DIAGNOSIS — F410 Panic disorder [episodic paroxysmal anxiety] without agoraphobia: Secondary | ICD-10-CM | POA: Diagnosis present

## 2023-03-22 DIAGNOSIS — M069 Rheumatoid arthritis, unspecified: Secondary | ICD-10-CM | POA: Diagnosis present

## 2023-03-22 DIAGNOSIS — I5032 Chronic diastolic (congestive) heart failure: Secondary | ICD-10-CM | POA: Diagnosis present

## 2023-03-22 DIAGNOSIS — K5909 Other constipation: Secondary | ICD-10-CM | POA: Diagnosis not present

## 2023-03-22 DIAGNOSIS — Z800 Family history of malignant neoplasm of digestive organs: Secondary | ICD-10-CM

## 2023-03-22 DIAGNOSIS — I10 Essential (primary) hypertension: Secondary | ICD-10-CM | POA: Diagnosis present

## 2023-03-22 DIAGNOSIS — I2510 Atherosclerotic heart disease of native coronary artery without angina pectoris: Secondary | ICD-10-CM | POA: Diagnosis present

## 2023-03-22 DIAGNOSIS — E785 Hyperlipidemia, unspecified: Secondary | ICD-10-CM | POA: Diagnosis present

## 2023-03-22 DIAGNOSIS — S2242XA Multiple fractures of ribs, left side, initial encounter for closed fracture: Principal | ICD-10-CM | POA: Diagnosis present

## 2023-03-22 DIAGNOSIS — J439 Emphysema, unspecified: Secondary | ICD-10-CM | POA: Diagnosis not present

## 2023-03-22 DIAGNOSIS — R339 Retention of urine, unspecified: Secondary | ICD-10-CM | POA: Diagnosis present

## 2023-03-22 DIAGNOSIS — F1010 Alcohol abuse, uncomplicated: Secondary | ICD-10-CM | POA: Diagnosis not present

## 2023-03-22 DIAGNOSIS — F411 Generalized anxiety disorder: Secondary | ICD-10-CM | POA: Diagnosis present

## 2023-03-22 DIAGNOSIS — D649 Anemia, unspecified: Secondary | ICD-10-CM | POA: Diagnosis not present

## 2023-03-22 DIAGNOSIS — E871 Hypo-osmolality and hyponatremia: Secondary | ICD-10-CM | POA: Diagnosis present

## 2023-03-22 DIAGNOSIS — Z9981 Dependence on supplemental oxygen: Secondary | ICD-10-CM

## 2023-03-22 DIAGNOSIS — Z888 Allergy status to other drugs, medicaments and biological substances status: Secondary | ICD-10-CM

## 2023-03-22 DIAGNOSIS — J90 Pleural effusion, not elsewhere classified: Secondary | ICD-10-CM | POA: Diagnosis not present

## 2023-03-22 DIAGNOSIS — E7800 Pure hypercholesterolemia, unspecified: Secondary | ICD-10-CM | POA: Diagnosis not present

## 2023-03-22 DIAGNOSIS — D508 Other iron deficiency anemias: Secondary | ICD-10-CM | POA: Diagnosis not present

## 2023-03-22 DIAGNOSIS — J9811 Atelectasis: Secondary | ICD-10-CM | POA: Diagnosis not present

## 2023-03-22 DIAGNOSIS — I110 Hypertensive heart disease with heart failure: Secondary | ICD-10-CM | POA: Diagnosis present

## 2023-03-22 DIAGNOSIS — J9621 Acute and chronic respiratory failure with hypoxia: Principal | ICD-10-CM | POA: Diagnosis present

## 2023-03-22 DIAGNOSIS — S2239XA Fracture of one rib, unspecified side, initial encounter for closed fracture: Secondary | ICD-10-CM | POA: Diagnosis present

## 2023-03-22 DIAGNOSIS — Y92009 Unspecified place in unspecified non-institutional (private) residence as the place of occurrence of the external cause: Secondary | ICD-10-CM

## 2023-03-22 DIAGNOSIS — W010XXA Fall on same level from slipping, tripping and stumbling without subsequent striking against object, initial encounter: Secondary | ICD-10-CM | POA: Diagnosis present

## 2023-03-22 DIAGNOSIS — J449 Chronic obstructive pulmonary disease, unspecified: Secondary | ICD-10-CM | POA: Diagnosis present

## 2023-03-22 LAB — BASIC METABOLIC PANEL
Anion gap: 12 (ref 5–15)
BUN: 12 mg/dL (ref 8–23)
CO2: 30 mmol/L (ref 22–32)
Calcium: 9 mg/dL (ref 8.9–10.3)
Chloride: 91 mmol/L — ABNORMAL LOW (ref 98–111)
Creatinine, Ser: 0.71 mg/dL (ref 0.61–1.24)
GFR, Estimated: >60 mL/min (ref >60)
Glucose, Bld: 100 mg/dL — ABNORMAL HIGH (ref 70–99)
Potassium: 3.6 mmol/L (ref 3.5–5.1)
Sodium: 133 mmol/L — ABNORMAL LOW (ref 135–145)

## 2023-03-22 LAB — CBC
HCT: 36.8 % — ABNORMAL LOW (ref 39.0–52.0)
HCT: 36.1 % — ABNORMAL LOW (ref 39.0–52.0)
Hemoglobin: 12.7 g/dL — ABNORMAL LOW (ref 13.0–17.0)
Hemoglobin: 12.5 g/dL — ABNORMAL LOW (ref 13.0–17.0)
MCH: 36.6 pg — ABNORMAL HIGH (ref 26.0–34.0)
MCH: 36.7 pg — ABNORMAL HIGH (ref 26.0–34.0)
MCHC: 34.5 g/dL (ref 30.0–36.0)
MCHC: 34.6 g/dL (ref 30.0–36.0)
MCV: 106.1 fL — ABNORMAL HIGH (ref 80.0–100.0)
MCV: 105.9 fL — ABNORMAL HIGH (ref 80.0–100.0)
Platelets: 130 10*3/uL — ABNORMAL LOW (ref 150–400)
Platelets: 124 10*3/uL — ABNORMAL LOW (ref 150–400)
RBC: 3.47 MIL/uL — ABNORMAL LOW (ref 4.22–5.81)
RBC: 3.41 MIL/uL — ABNORMAL LOW (ref 4.22–5.81)
RDW: 15.8 % — ABNORMAL HIGH (ref 11.5–15.5)
RDW: 15.7 % — ABNORMAL HIGH (ref 11.5–15.5)
WBC: 6.4 10*3/uL (ref 4.0–10.5)
WBC: 6.7 10*3/uL (ref 4.0–10.5)
nRBC: 0 % (ref 0.0–0.2)
nRBC: 0 % (ref 0.0–0.2)

## 2023-03-22 LAB — CREATININE, SERUM
Creatinine, Ser: 0.83 mg/dL (ref 0.61–1.24)
GFR, Estimated: >60 mL/min (ref >60)

## 2023-03-22 MED ORDER — LORAZEPAM 1 MG PO TABS: 1.0000 mg | ORAL_TABLET | ORAL | Status: DC | PRN

## 2023-03-22 MED ORDER — OXYCODONE HCL 5 MG PO TABS
5.0000 mg | ORAL_TABLET | ORAL | Status: DC | PRN
  Administered 2023-03-22: 5 mg via ORAL
  Filled 2023-03-22: qty 1

## 2023-03-22 MED ORDER — OXYCODONE HCL 5 MG PO TABS
2.5000 mg | ORAL_TABLET | ORAL | Status: DC | PRN
  Administered 2023-03-23 (×2): 5 mg via ORAL
  Filled 2023-03-22 (×2): qty 1

## 2023-03-22 MED ORDER — POLYETHYLENE GLYCOL 3350 17 G PO PACK: 17.0000 g | PACK | Freq: Every day | ORAL | Status: DC | PRN

## 2023-03-22 MED ORDER — HYDROMORPHONE HCL 1 MG/ML IJ SOLN
1.0000 mg | Freq: Once | INTRAMUSCULAR | Status: AC
  Administered 2023-03-22: 1 mg via INTRAVENOUS
  Filled 2023-03-22: qty 1

## 2023-03-22 MED ORDER — SENNA 8.6 MG PO TABS
2.0000 | ORAL_TABLET | Freq: Every day | ORAL | Status: DC
Start: 2023-03-22 — End: 2023-03-22

## 2023-03-22 MED ORDER — HYDROMORPHONE HCL 1 MG/ML IJ SOLN
0.5000 mg | INTRAMUSCULAR | Status: DC | PRN
  Administered 2023-03-22: 0.5 mg via INTRAVENOUS
  Filled 2023-03-22: qty 0.5

## 2023-03-22 MED ORDER — ACETAMINOPHEN 500 MG PO TABS
1000.0000 mg | ORAL_TABLET | Freq: Four times a day (QID) | ORAL | Status: DC
  Administered 2023-03-22 – 2023-03-24 (×6): 1000 mg via ORAL
  Filled 2023-03-22 (×8): qty 2

## 2023-03-22 MED ORDER — LORAZEPAM 2 MG/ML IJ SOLN: 1.0000 mg | INTRAMUSCULAR | Status: DC | PRN

## 2023-03-22 MED ORDER — DOCUSATE SODIUM 100 MG PO CAPS
100.0000 mg | ORAL_CAPSULE | Freq: Two times a day (BID) | ORAL | Status: DC
  Administered 2023-03-22 – 2023-03-24 (×4): 100 mg via ORAL
  Filled 2023-03-22 (×4): qty 1

## 2023-03-22 MED ORDER — METHOCARBAMOL 500 MG PO TABS
500.0000 mg | ORAL_TABLET | Freq: Four times a day (QID) | ORAL | Status: DC | PRN
  Administered 2023-03-24 (×2): 500 mg via ORAL
  Filled 2023-03-22 (×2): qty 1

## 2023-03-22 MED ORDER — ADULT MULTIVITAMIN W/MINERALS CH
1.0000 | ORAL_TABLET | Freq: Every day | ORAL | Status: DC
  Administered 2023-03-22 – 2023-03-23 (×2): 1 via ORAL
  Filled 2023-03-22 (×3): qty 1

## 2023-03-22 MED ORDER — SERTRALINE HCL 100 MG PO TABS: 100.0000 mg | ORAL_TABLET | Freq: Every day | ORAL | Status: DC

## 2023-03-22 MED ORDER — BISACODYL 10 MG RE SUPP: 10.0000 mg | Freq: Every day | RECTAL | Status: DC | PRN

## 2023-03-22 MED ORDER — ACETAMINOPHEN 325 MG PO TABS: 650.0000 mg | ORAL_TABLET | Freq: Four times a day (QID) | ORAL | Status: DC | PRN

## 2023-03-22 MED ORDER — AMLODIPINE BESYLATE 5 MG PO TABS
5.0000 mg | ORAL_TABLET | Freq: Every day | ORAL | Status: DC
  Administered 2023-03-23 – 2023-03-24 (×2): 5 mg via ORAL
  Filled 2023-03-22 (×2): qty 1

## 2023-03-22 MED ORDER — POLYETHYLENE GLYCOL 3350 17 G PO PACK
17.0000 g | PACK | Freq: Two times a day (BID) | ORAL | Status: DC
  Administered 2023-03-22 – 2023-03-24 (×4): 17 g via ORAL
  Filled 2023-03-22 (×4): qty 1

## 2023-03-22 MED ORDER — IPRATROPIUM-ALBUTEROL 0.5-2.5 (3) MG/3ML IN SOLN: 3.0000 mL | Freq: Once | RESPIRATORY_TRACT | Status: DC

## 2023-03-22 MED ORDER — BISACODYL 5 MG PO TBEC: 5.0000 mg | DELAYED_RELEASE_TABLET | Freq: Every day | ORAL | Status: DC | PRN

## 2023-03-22 MED ORDER — FUROSEMIDE 40 MG PO TABS
40.0000 mg | ORAL_TABLET | Freq: Every morning | ORAL | Status: DC
  Administered 2023-03-23 – 2023-03-24 (×2): 40 mg via ORAL
  Filled 2023-03-22 (×2): qty 1

## 2023-03-22 MED ORDER — ATORVASTATIN CALCIUM 10 MG PO TABS
20.0000 mg | ORAL_TABLET | Freq: Every day | ORAL | Status: DC
  Administered 2023-03-23 – 2023-03-24 (×2): 20 mg via ORAL
  Filled 2023-03-22 (×2): qty 2

## 2023-03-22 MED ORDER — FOLIC ACID 1 MG PO TABS
1.0000 mg | ORAL_TABLET | Freq: Every day | ORAL | Status: DC
  Administered 2023-03-22 – 2023-03-24 (×3): 1 mg via ORAL
  Filled 2023-03-22 (×3): qty 1

## 2023-03-22 MED ORDER — KETOROLAC TROMETHAMINE 30 MG/ML IJ SOLN
15.0000 mg | Freq: Once | INTRAMUSCULAR | Status: AC
  Administered 2023-03-22: 15 mg via INTRAVENOUS
  Filled 2023-03-22: qty 1

## 2023-03-22 MED ORDER — SENNA 8.6 MG PO TABS
1.0000 | ORAL_TABLET | Freq: Every day | ORAL | Status: DC
  Administered 2023-03-22 – 2023-03-24 (×3): 8.6 mg via ORAL
  Filled 2023-03-22 (×3): qty 1

## 2023-03-22 MED ORDER — POLYETHYLENE GLYCOL 3350 17 G PO PACK
17.0000 g | PACK | Freq: Every day | ORAL | Status: DC
  Administered 2023-03-22: 17 g via ORAL
  Filled 2023-03-22: qty 1

## 2023-03-22 MED ORDER — ENOXAPARIN SODIUM 40 MG/0.4ML IJ SOSY
40.0000 mg | PREFILLED_SYRINGE | INTRAMUSCULAR | Status: DC
  Administered 2023-03-22 – 2023-03-23 (×2): 40 mg via SUBCUTANEOUS
  Filled 2023-03-22 (×2): qty 0.4

## 2023-03-22 MED ORDER — FOLIC ACID 1 MG PO TABS: 1.0000 mg | ORAL_TABLET | Freq: Every day | ORAL | Status: DC

## 2023-03-22 MED ORDER — LIDOCAINE 5 % EX PTCH
1.0000 | MEDICATED_PATCH | CUTANEOUS | Status: DC
  Administered 2023-03-22 – 2023-03-23 (×2): 1 via TRANSDERMAL
  Filled 2023-03-22 (×2): qty 1

## 2023-03-22 MED ORDER — ALPRAZOLAM 0.25 MG PO TABS
0.2500 mg | ORAL_TABLET | Freq: Every evening | ORAL | Status: DC | PRN
  Administered 2023-03-23: 0.25 mg via ORAL
  Filled 2023-03-22 (×2): qty 1

## 2023-03-22 MED ORDER — THIAMINE HCL 100 MG/ML IJ SOLN
100.0000 mg | Freq: Every day | INTRAMUSCULAR | Status: DC
  Filled 2023-03-22: qty 2

## 2023-03-22 MED ORDER — ALBUTEROL SULFATE HFA 108 (90 BASE) MCG/ACT IN AERS
2.0000 | INHALATION_SPRAY | Freq: Four times a day (QID) | RESPIRATORY_TRACT | Status: DC | PRN
  Administered 2023-03-23: 2 via RESPIRATORY_TRACT
  Filled 2023-03-22: qty 6.7

## 2023-03-22 MED ORDER — HYDROMORPHONE HCL 1 MG/ML IJ SOLN
0.5000 mg | Freq: Once | INTRAMUSCULAR | Status: AC
  Administered 2023-03-22: 0.5 mg via INTRAVENOUS
  Filled 2023-03-22: qty 1

## 2023-03-22 MED ORDER — SERTRALINE HCL 100 MG PO TABS
100.0000 mg | ORAL_TABLET | Freq: Every morning | ORAL | Status: DC
  Administered 2023-03-23 – 2023-03-24 (×2): 100 mg via ORAL
  Filled 2023-03-22 (×2): qty 1

## 2023-03-22 MED ORDER — THIAMINE MONONITRATE 100 MG PO TABS
100.0000 mg | ORAL_TABLET | Freq: Every day | ORAL | Status: DC
  Administered 2023-03-22 – 2023-03-24 (×3): 100 mg via ORAL
  Filled 2023-03-22 (×3): qty 1

## 2023-03-22 MED ORDER — ACETAMINOPHEN 650 MG RE SUPP: 650.0000 mg | Freq: Four times a day (QID) | RECTAL | Status: DC | PRN

## 2023-03-22 MED ORDER — IPRATROPIUM-ALBUTEROL 0.5-2.5 (3) MG/3ML IN SOLN
RESPIRATORY_TRACT | Status: AC
  Administered 2023-03-22: 3 mL
  Filled 2023-03-22: qty 3

## 2023-03-22 NOTE — H&P (Signed)
 History and Physical    Santhosh Gulino Lebeck ONG:[id number] DOB: Feb 18, 1945 DOA: 03/22/2023  PCP: Chilton Greathouse, MD  Patient coming from: Home/D [facility] ED  I have personally briefly reviewed patient's old medical records in [facility]  Chief Complaint: Fall, left rib pain  HPI: [name] [name] is a 78 y.o. 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  According to patient, he fell 3/10 - ground level fall that occurred when he tripped over his oxygen tubing and struck his left side against the arm of his couch. He also tore the skin on his left hand. He denies head injury or LOC. He was evaluated in ED 2 days later on 3/12 and found to have a rib fracture. He was offered admission but declined. He was discharged with IS and oxycodone for pain control and told to follow up with PCP. Since that time he has had worsening pain and shortness of breath at home requiring increasing amounts of O2. He has baseline intermittent cough. He has constipation intermittently at baseline for which he takes OTC medications. Last BM was Tuesday. He has had difficulty urinating since Wednesday. Last urinated last night. He denies fever, chills, urinary symptoms, n/v/abdominal pain.  He is a former smoker but drinks alcohol daily-12 beers per week-2-3 drinks per day.  No history of withdrawal.  ED Course: Upon arrival to ED, he was hemodynamically stable, hypoxic requiring 4 L of oxygen to maintain oxygen saturation over 90%.  CT chest shows fractures of the left 6th through 9th ribs with small pleural effusion and dependent atelectasis.  Upper lobe emphysema and pulmonary scarring.  Coronary artery calcification.  Patient received few doses of IV pain medications.  Trauma surgery was  consulted who recommended admission for observation under hospitalist service.  TRH was then consulted for admission and patient was transferred to [facility].  Review of Systems: As per HPI otherwise negative.    Past Medical History:  Diagnosis Date   COPD (chronic obstructive pulmonary disease) (HCC)    High cholesterol    Hypertension     Past Surgical History:  Procedure Laterality Date   NASAL SINUS SURGERY  2007     reports that he quit smoking about 15 years ago. His smoking use included cigarettes. He started smoking about 55 years ago. He has a 80 pack-year smoking history. He has never been exposed to tobacco smoke. He has never used smokeless tobacco. He reports current alcohol use of about 12.0 standard drinks of alcohol per week. He reports that he does not use drugs.  Allergies  Allergen Reactions   Fluticasone-Salmeterol Shortness Of Breath and Other (See Comments)    "Worsening shortness of breath.."    Family History  Problem Relation Age of Onset   Esophageal cancer Father     Prior to Admission medications   Medication Sig Start Date End Date Taking? Authorizing Provider  albuterol (VENTOLIN HFA) 108 (90 Base) MCG/ACT inhaler Inhale 2 puffs into the lungs every 6 (six) hours as needed for wheezing or shortness of breath. Patient taking differently: Inhale 2 puffs into the lungs See admin instructions. Inhale 2 puffs into the lungs every 4-6 hours as needed for shortness of breath or wheezing 12/08/21   Oretha Milch, MD  ALPRAZolam (XANAX) 0.25 MG tablet Take 0.25 mg by mouth at bedtime as needed.    [provider]  amLODipine (NORVASC) 5 MG tablet Take 5 mg by mouth daily. 11/23/22   [provider]  atorvastatin (LIPITOR) 20 MG tablet Take 20 mg by mouth daily. 07/01/22   [provider]  Budeson-Glycopyrrol-Formoterol (BREZTRI AEROSPHERE) 160-9-4.8 MCG/ACT AERO Inhale 2 puffs into the lungs in the morning and at bedtime.  02/11/23   Glenford Bayley, NP  folic acid (FOLVITE) 1 MG tablet Take 1 mg by mouth daily. 11/01/22   [provider]  furosemide (LASIX) 40 MG tablet Take 40 mg by mouth in the morning. 03/06/22   [provider]  hydrOXYzine (ATARAX) 25 MG tablet TAKE 1 TABLET BY MOUTH EVERY 8 HOURS AS NEEDED FOR ANXIETY. 11/23/22   Glenford Bayley, NP  ipratropium-albuterol (DUONEB) 0.5-2.5 (3) MG/3ML SOLN Take 3 mLs by nebulization every 4 (four) hours as needed (shortness of breath or wheezing). Patient not taking: Reported on 02/06/2023 07/09/22   Marinda Elk, MD  methotrexate 50 MG/2ML injection Inject 20 mg into the skin once a week.    [provider]  Multiple Vitamins-Minerals (MULTIVITAMIN WITH MINERALS) tablet Take 1 tablet by mouth daily with breakfast.    [provider]  oxyCODONE (ROXICODONE) 5 MG immediate release tablet Take 1 tablet (5 mg total) by mouth every 4 (four) hours as needed for severe pain (pain score 7-10). 03/20/23   Rondel Baton, MD  OXYGEN Inhale 3 L/min into the lungs continuous.    [provider]  polyethylene glycol (MIRALAX / GLYCOLAX) 17 g packet Take 17 g by mouth daily as needed for mild constipation. 07/09/22   Shalhoub, Deno Lunger, MD  polyethylene glycol (MIRALAX / GLYCOLAX) 17 g packet Take 17 g by mouth daily. 07/10/22   Shalhoub, Deno Lunger, MD  Potassium Chloride ER 20 MEQ TBCR Take 20 mEq by mouth daily. 02/02/22   [provider]  predniSONE (DELTASONE) 10 MG tablet Take 2 tabs daily with food x 5ds, then 1 tab daily with food x 5ds then STOP 02/06/23   Oretha Milch, MD  senna (SENOKOT) 8.6 MG TABS tablet Take 2 tablets (17.2 mg total) by mouth at bedtime. 07/09/22   Shalhoub, Deno Lunger, MD  sertraline (ZOLOFT) 100 MG tablet Take 100 mg by mouth daily. 02/22/23   [provider]  sertraline (ZOLOFT) 50 MG tablet Take 100 mg by mouth in the morning. 05/30/22   [provider]  vitamin B-12  (CYANOCOBALAMIN) 1000 MCG tablet Take 1,000 mcg by mouth daily.    [provider]    Physical Exam: Vitals:   03/22/23 1330 03/22/23 1345 03/22/23 1534 03/22/23 1623  BP: (!) 116/57 (!) 115/54  133/63  Pulse: 89 88  93  Resp: 15 18  18   Temp:   98.1 F (36.7 C) 97.8 F (36.6 C)  TempSrc:   Oral   SpO2: 97% 92%  95%  Weight:      Height:        Constitutional: NAD, calm, comfortable Vitals:   03/22/23 1330 03/22/23 1345 03/22/23 1534 03/22/23 1623  BP: (!) 116/57 (!) 115/54  133/63  Pulse: 89 88  93  Resp: 15 18  18   Temp:   98.1 F (36.7 C) 97.8 F (36.6 C)  TempSrc:   Oral   SpO2: 97% 92%  95%  Weight:      Height:  Eyes: PERRL, lids and conjunctivae normal ENMT: Mucous membranes are moist. Posterior pharynx clear of any exudate or lesions.Normal dentition.  Neck: normal, supple, no masses, no thyromegaly Respiratory: Diminished breath sounds bilaterally, tenderness at the left anterior lower chest Cardiovascular: Regular rate and rhythm, no murmurs / rubs / gallops. No extremity edema. 2+ pedal pulses. No carotid bruits.  Abdomen: no tenderness, no masses palpated. No hepatosplenomegaly. Bowel sounds positive.  Musculoskeletal: no clubbing / cyanosis. No joint deformity upper and lower extremities. Good ROM, no contractures. Normal muscle tone.  Skin: no rashes, lesions, ulcers. No induration Neurologic: CN 2-12 grossly intact. Sensation intact, DTR normal. Strength 5/5 in all 4.  Psychiatric: Normal judgment and insight. Alert and oriented x 3. Normal mood.    Labs on Admission: I have personally reviewed following labs and imaging studies  CBC: Recent Labs  Lab 03/22/23 1145  WBC 6.4  HGB 12.7*  HCT 36.8*  MCV 106.1*  PLT 130*   Basic Metabolic Panel: Recent Labs  Lab 03/22/23 1145  NA 133*  K 3.6  CL 91*  CO2 30  GLUCOSE 100*  BUN 12  CREATININE 0.71  CALCIUM 9.0   GFR: Estimated Creatinine Clearance: 93.8 mL/min (by C-G  formula based on SCr of 0.71 mg/dL). Liver Function Tests: No results for input(s): "AST", "ALT", "ALKPHOS", "BILITOT", "PROT", "ALBUMIN" in the last 168 hours. No results for input(s): "LIPASE", "AMYLASE" in the last 168 hours. No results for input(s): "AMMONIA" in the last 168 hours. Coagulation Profile: No results for input(s): "INR", "PROTIME" in the last 168 hours. Cardiac Enzymes: No results for input(s): "CKTOTAL", "CKMB", "CKMBINDEX", "TROPONINI" in the last 168 hours. BNP (last 3 results) No results for input(s): "PROBNP" in the last 8760 hours. HbA1C: No results for input(s): "HGBA1C" in the last 72 hours. CBG: No results for input(s): "GLUCAP" in the last 168 hours. Lipid Profile: No results for input(s): "CHOL", "HDL", "LDLCALC", "TRIG", "CHOLHDL", "LDLDIRECT" in the last 72 hours. Thyroid Function Tests: No results for input(s): "TSH", "T4TOTAL", "FREET4", "T3FREE", "THYROIDAB" in the last 72 hours. Anemia Panel: No results for input(s): "VITAMINB12", "FOLATE", "FERRITIN", "TIBC", "IRON", "RETICCTPCT" in the last 72 hours. Urine analysis: No results found for: "COLORURINE", "APPEARANCEUR", "LABSPEC", "PHURINE", "GLUCOSEU", "HGBUR", "BILIRUBINUR", "KETONESUR", "PROTEINUR", "UROBILINOGEN", "NITRITE", "LEUKOCYTESUR"  Radiological Exams on Admission: CT Chest Wo Contrast Result Date: 03/22/2023 CLINICAL DATA:  Blunt chest Trauma EXAM: CT CHEST WITHOUT CONTRAST TECHNIQUE: Multidetector CT imaging of the chest was performed following the standard protocol without IV contrast. RADIATION DOSE REDUCTION: This exam was performed according to the departmental dose-optimization program which includes automated exposure control, adjustment of the mA and/or kV according to patient size and/or use of iterative reconstruction technique. COMPARISON:  Radiography 03/20/2023.  Chest CT 10/04/2022. FINDINGS: Cardiovascular: Heart size upper limits of normal. No pericardial fluid. Coronary artery  calcification and aortic atherosclerotic calcification are present. Mediastinum/Nodes: No mediastinal or hilar mass or lymphadenopathy. Lungs/Pleura: Upper lobe predominant emphysema and pulmonary scarring as seen previously. No evidence of active pneumonia or developing mass lesion. Chronic scarring/volume loss in the lingula. Small amount of layering pleural fluid on the left with dependent atelectasis. No pneumothorax. Upper Abdomen: Negative Musculoskeletal: Nondisplaced fracture of the left lateral sixth rib. Minimally displaced fracture of the left lateral seventh rib. Mildly displaced and overlapped fracture of the left lateral eighth rib. Minimally displaced fracture of the left ninth rib. No sternal fracture. No spinal fracture. IMPRESSION: 1. Fractures of the left lateral sixth through ninth ribs. Small amount of  layering pleural fluid on the left with dependent atelectasis. No pneumothorax. 2. Upper lobe predominant emphysema and pulmonary scarring as seen previously. 3. Coronary artery calcification and aortic atherosclerotic calcification. Electronically Signed   By: Paulina Fusi M.D.   On: 03/22/2023 14:37    EKG: Independently reviewed.  [place] rhythm with no acute ST-T wave changes  Assessment/Plan Principal Problem:   Rib fracture   Mechanical ground-level fall/left sided multiple rib fractures: No pneumothorax.  Only small pleural effusion.  IV Dilaudid as needed, oral Roxicodone and muscle relaxants.  Pulmonary toilet with incentive spirometry and flutter valve.  Acute on chronic hypoxic respiratory failure: Uses 3 to 4 L oxygen at home, currently requiring 5 L.  Secondary to atelectasis and mild pleural effusion.  Incentive spirometry and flutter valve encouraged.  COPD: Currently at baseline home oxygen which is 3 L at rest and 4 L with exertion.  Resume home medications.  Hypertension: Blood pressure controlled.  Resume home medications.  Hyperlipidemia: Resume statin.  EtOH  dependence: No signs of withdrawal, continue CIWA protocol with as needed Ativan.  Rheumatoid arthritis: On methotrexate at baseline.  Acute anemia: Hemoglobin 12.7, was 15.6 just 8 months ago.  No reports of melena, hematochezia or hematemesis.  Repeat CBC in the morning.  Urinary retention: Last voided last night.  Will monitor with intermittent bladder scans if needed.  Anxiety: Resume Zoloft and Xanax.  Chronic diastolic congestive heart failure: Last echo in June 2024 shows normal ejection fraction and grade 1 diastolic dysfunction.  Will resume home dose of Lasix.  DVT prophylaxis: enoxaparin (LOVENOX) injection 40 mg Start: 03/22/23 1800 Code Status: Full code Family Communication: None present at bedside.  Plan of care discussed with patient in length and he verbalized understanding and agreed with it. Disposition Plan: Potential discharge in 1 to 2 days Consults called: Trauma surgery  Hughie Closs MD [facility]  *Please note that this is a verbal dictation therefore any spelling or grammatical errors are due to the "[employer] One" system interpretation.  Please page via Amion and do not message via secure chat for urgent patient care matters. Secure chat can be used for non urgent patient care matters. 03/22/2023, 5:32 PM  To contact the attending provider between 7A-7P or the covering provider during after hours 7P-7A, please log into the web site [email or web address]

## 2023-03-22 NOTE — ED Notes (Signed)
[name] [name] with cl called for transport

## 2023-03-22 NOTE — ED Provider Notes (Signed)
 [place] EMERGENCY DEPARTMENT AT [facility] Provider Note   CSN: [id number] Arrival date & time: 03/22/23  1105     History  Chief Complaint  Patient presents with   Rib Injury   Shortness of Breath    [name] [name] is a 78 y.o. male.   Shortness of Breath Patient presents with rib pain and shortness of breath.  Has been seen earlier this week on Wednesday with today being Friday for a fall with left-sided rib fracture.  Had x-ray that showed a throat fracture.  Now pain not controlled with the oxycodone.  Is on baseline oxygen 3 L at rest and 4 with exertion.  Had sats of 82% upon arrival on his home oxygen.  Now on 4 L with sats of 97%.  Pain worse with movement.  Worse with breathing.  No fevers.  Had been offered admission with previous visit but decided to give a try at home.    Past Medical History:  Diagnosis Date   COPD (chronic obstructive pulmonary disease) (HCC)    High cholesterol    Hypertension     Home Medications Prior to Admission medications   Medication Sig Start Date End Date Taking? Authorizing Provider  albuterol (VENTOLIN HFA) 108 (90 Base) MCG/ACT inhaler Inhale 2 puffs into the lungs every 6 (six) hours as needed for wheezing or shortness of breath. Patient taking differently: Inhale 2 puffs into the lungs See admin instructions. Inhale 2 puffs into the lungs every 4-6 hours as needed for shortness of breath or wheezing 12/08/21   Oretha Milch, MD  ALPRAZolam Prudy Feeler) 0.25 MG tablet Take 0.25 mg by mouth at bedtime as needed.    [provider]  amLODipine (NORVASC) 5 MG tablet Take 5 mg by mouth daily. 11/23/22   [provider]  atorvastatin (LIPITOR) 20 MG tablet Take 20 mg by mouth daily. 07/01/22   [provider]  Budeson-Glycopyrrol-Formoterol (BREZTRI AEROSPHERE) 160-9-4.8 MCG/ACT AERO Inhale 2 puffs into the lungs in the morning and at bedtime. 02/11/23   Glenford Bayley, NP  folic acid (FOLVITE)  1 MG tablet Take 1 mg by mouth daily. 11/01/22   [provider]  furosemide (LASIX) 40 MG tablet Take 40 mg by mouth in the morning. 03/06/22   [provider]  hydrOXYzine (ATARAX) 25 MG tablet TAKE 1 TABLET BY MOUTH EVERY 8 HOURS AS NEEDED FOR ANXIETY. 11/23/22   Glenford Bayley, NP  ipratropium-albuterol (DUONEB) 0.5-2.5 (3) MG/3ML SOLN Take 3 mLs by nebulization every 4 (four) hours as needed (shortness of breath or wheezing). Patient not taking: Reported on 02/06/2023 07/09/22   Marinda Elk, MD  methotrexate 50 MG/2ML injection Inject 20 mg into the skin once a week.    [provider]  Multiple Vitamins-Minerals (MULTIVITAMIN WITH MINERALS) tablet Take 1 tablet by mouth daily with breakfast.    [provider]  oxyCODONE (ROXICODONE) 5 MG immediate release tablet Take 1 tablet (5 mg total) by mouth every 4 (four) hours as needed for severe pain (pain score 7-10). 03/20/23   Rondel Baton, MD  OXYGEN Inhale 3 L/min into the lungs continuous.    [provider]  polyethylene glycol (MIRALAX / GLYCOLAX) 17 g packet Take 17 g by mouth daily as needed for mild constipation. 07/09/22   Shalhoub, Deno Lunger, MD  polyethylene glycol (MIRALAX / GLYCOLAX) 17 g packet Take 17 g by mouth daily. 07/10/22   Shalhoub, Deno Lunger, MD  Potassium Chloride  ER 20 MEQ TBCR Take 20 mEq by mouth daily. 02/02/22   [provider]  predniSONE (DELTASONE) 10 MG tablet Take 2 tabs daily with food x 5ds, then 1 tab daily with food x 5ds then STOP 02/06/23   Oretha Milch, MD  senna (SENOKOT) 8.6 MG TABS tablet Take 2 tablets (17.2 mg total) by mouth at bedtime. 07/09/22   Shalhoub, Deno Lunger, MD  sertraline (ZOLOFT) 100 MG tablet Take 100 mg by mouth daily. 02/22/23   [provider]  sertraline (ZOLOFT) 50 MG tablet Take 100 mg by mouth in the morning. 05/30/22   [provider]  vitamin B-12 (CYANOCOBALAMIN) 1000 MCG tablet Take 1,000 mcg by mouth  daily.    [provider]      Allergies    Fluticasone-salmeterol    Review of Systems   Review of Systems  Respiratory:  Positive for shortness of breath.     Physical Exam Updated Vital Signs BP (!) 115/54   Pulse 88   Temp 99.1 F (37.3 C)   Resp 18   Ht 5\' 11"  (1.803 m)   Wt 94.9 kg   SpO2 92%   BMI 29.18 kg/m  Physical Exam Vitals and nursing note reviewed.  Cardiovascular:     Rate and Rhythm: Tachycardia present.  Pulmonary:     Comments: Equal breath sounds bilaterally.  Tenderness to left lateral chest wall. Chest:     Chest wall: Tenderness present.  Abdominal:     Tenderness: There is no abdominal tenderness.  Skin:    Capillary Refill: Capillary refill takes less than 2 seconds.  Neurological:     Mental Status: He is alert.     ED Results / Procedures / Treatments   Labs (all labs ordered are listed, but only abnormal results are displayed) Labs Reviewed  BASIC METABOLIC PANEL - Abnormal; Notable for the following components:      Result Value   Sodium 133 (*)    Chloride 91 (*)    Glucose, Bld 100 (*)    All other components within normal limits  CBC - Abnormal; Notable for the following components:   RBC 3.47 (*)    Hemoglobin 12.7 (*)    HCT 36.8 (*)    MCV 106.1 (*)    MCH 36.6 (*)    RDW 15.8 (*)    Platelets 130 (*)    All other components within normal limits    EKG None  Radiology No results found.  Procedures Procedures    Medications Ordered in ED Medications  ipratropium-albuterol (DUONEB) 0.5-2.5 (3) MG/3ML nebulizer solution 3 mL (3 mLs Nebulization Not Given 03/22/23 1145)  HYDROmorphone (DILAUDID) injection 0.5 mg (0.5 mg Intravenous Given 03/22/23 1153)  ipratropium-albuterol (DUONEB) 0.5-2.5 (3) MG/3ML nebulizer solution (3 mLs  Given 03/22/23 1142)  ketorolac (TORADOL) 30 MG/ML injection 15 mg (15 mg Intravenous Given 03/22/23 1230)  HYDROmorphone (DILAUDID) injection 1 mg (1 mg Intravenous Given 03/22/23  1230)    ED Course/ Medical Decision Making/ A&P                                 Medical Decision Making Amount and/or Complexity of Data Reviewed Labs: ordered. Radiology: ordered.  Risk Prescription drug management. Decision regarding hospitalization.   Patient has left-sided chest pain after rib fracture.  Pain uncontrolled and more short of breath.  Pain unrelieved with oral narcotics.  Hypoxic with ambulation.  Will give IV Dilaudid here.  Will get CT scan to further evaluate.  Abdomen nontender.  Reviewed recent ER note.  Blood work overall reassuring.  Required repeat dose of Dilaudid here.  CT scan independently interpreted shows at least 4 rib fractures.  Discussed with Dr. Janee Morn from trauma surgery.  Recommends medicine admission but at [facility] so they can consult.   She is covering [facility] think she can put inDiscussed with hospitalist mission for [employer].   CRITICAL CARE Performed by: Benjiman Core Total critical care time: 30 minutes Critical care time was exclusive of separately billable procedures and treating other patients. Critical care was necessary to treat or prevent imminent or life-threatening deterioration. Critical care was time spent personally by me on the following activities: development of treatment plan with patient and/or surrogate as well as nursing, discussions with consultants, evaluation of patient's response to treatment, examination of patient, obtaining history from patient or surrogate, ordering and performing treatments and interventions, ordering and review of laboratory studies, ordering and review of radiographic studies, pulse oximetry and re-evaluation of patient's condition.            Final Clinical Impression(s) / ED Diagnoses Final diagnoses:  Closed fracture of multiple ribs of left side, initial encounter    Rx / DC Orders ED Discharge Orders     None         Benjiman Core,  MD 03/22/23 1359

## 2023-03-22 NOTE — ED Triage Notes (Signed)
 Seen on Wednesday for fracture ribs  Eight rib fracture left side.  Return as not keeping his oxygen sats up  82% in triage with labored breathing

## 2023-03-22 NOTE — Progress Notes (Signed)
 Transition of Care [facility]) - CAGE-AID Screening   Patient Details  Name: [name] [name] MRN: [id number] Date of Birth: 04/30/1945  Transition of Care [facility]) CM/SW Contact:    Katha Hamming, RN Phone Number: 03/22/2023, 9:50 PM   Clinical Narrative:  Reports 2 beers daily, states he doesn't experience withdrawal symptoms when he doesn't drink. Not interested in resources.  CAGE-AID Screening:    Have You Ever Felt You Ought to Cut Down on Your Drinking or Drug Use?: No Have People Annoyed You By Critizing Your Drinking Or Drug Use?: No Have You Felt Bad Or Guilty About Your Drinking Or Drug Use?: No Have You Ever Had a Drink or Used Drugs First Thing In The Morning to Steady Your Nerves or to Get Rid of a Hangover?: No CAGE-AID Score: 0  Substance Abuse Education Offered: No

## 2023-03-22 NOTE — Consult Note (Signed)
 Consult Note  Devun Anna [facility] 1945-09-14  [id number].    Requesting MD: Dr. Rubin Payor Chief Complaint/Reason for Consult: fall, rib fractures  HPI:  78 y.o. male with medical history significant for HTN, HLD, COPD on supp O2 who presented to [facility] ED with shortness of breath and low spo2 in setting of recent fall with rib fracture and baseline O2 therapy. Patient fell 3/10 - ground level fall that occurred when he tripped over his oxygen tubing and struck his left side against the arm of his couch. He also tore the skin on his left hand. He denies head injury or LOC. He was evaluated in ED 2 days later on 3/12 and found to have a rib fracture. He was offered admission but declined. He was discharged with IS and oxycodone for pain control and told to follow up with PCP. Since that time he has had worsening pain and shortness of breath at home requiring increasing amounts of O2. He has baseline intermittent cough. He has constipation intermittently at baseline for which he takes OTC medications. Last BM was Tuesday. He has had difficulty urinating since Wednesday. Last urinated last night. He denies fever, chills, urinary symptoms, n/v/abdominal pain.   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 He has not experienced withdrawal in the past Allergies: fluticasone-salmeterol - SHOB Blood thinners: none  He lives at home with his daughter.   ROS: Reviewed and as above  Family History  Problem Relation Age of Onset   Esophageal cancer Father     Past Medical History:  Diagnosis Date   COPD (chronic obstructive pulmonary disease) (HCC)    High cholesterol    Hypertension     Past Surgical History:  Procedure Laterality Date   NASAL SINUS SURGERY  2007    Social History:  reports that he quit smoking about 15 years ago. His smoking use included cigarettes. He started smoking about 55 years ago. He has a 80 pack-year  smoking history. He has never been exposed to tobacco smoke. He has never used smokeless tobacco. He reports current alcohol use of about 12.0 standard drinks of alcohol per week. He reports that he does not use drugs.  Allergies:  Allergies  Allergen Reactions   Fluticasone-Salmeterol Shortness Of Breath and Other (See Comments)    "Worsening shortness of breath.."    (Not in a hospital admission)   Blood pressure (!) 115/54, pulse 88, temperature 99.1 F (37.3 C), resp. rate 18, height 5\' 11"  (1.803 m), weight 94.9 kg, SpO2 92%. Physical Exam: General: pleasant, WD, male who is laying in bed in NAD HEENT: head is normocephalic, atraumatic.  Sclera are noninjected.  Pupils equal and round. EOMs intact.  Ears and nose without any masses or lesions.  Mouth is pink and moist Heart: regular, rate, and rhythm.   Palpable radial and pedal pulses bilaterally Lungs: Respiratory effort nonlabored on supplemental o2 at 5 lpm. Patient spo2 dropped to high 80s with mobility to bed. By the end of my exam spo2 95% on 5 lpm Abd: soft, NT, +BS, no masses, hernias, or organomegaly. Mild distension MSK: all 4 extremities are symmetrical with no cyanosis, clubbing, or edema. Skin: warm and dry with no masses, lesions, or rashes Neuro: Cranial nerves 2-12 grossly intact, sensation is normal throughout Psych: A&Ox3 with an appropriate affect.    Results for orders placed or performed during the hospital encounter of 03/22/23 (from the  past 48 hours)  Basic metabolic panel     Status: Abnormal   Collection Time: 03/22/23 11:45 AM  Result Value Ref Range   Sodium 133 (L) 135 - 145 mmol/L   Potassium 3.6 3.5 - 5.1 mmol/L   Chloride 91 (L) 98 - 111 mmol/L   CO2 30 22 - 32 mmol/L   Glucose, Bld 100 (H) 70 - 99 mg/dL    Comment: Glucose reference range applies only to samples taken after fasting for at least 8 hours.   BUN 12 8 - 23 mg/dL   Creatinine, Ser 1.61 0.61 - 1.24 mg/dL   Calcium 9.0 8.9 - 09.6  mg/dL   GFR, Estimated >04 >54 mL/min    Comment: (NOTE) Calculated using the CKD-EPI Creatinine Equation (2021)    Anion gap 12 5 - 15    Comment: Performed at [name], [address] [place], [place] [address]  CBC     Status: Abnormal   Collection Time: 03/22/23 11:45 AM  Result Value Ref Range   WBC 6.4 4.0 - 10.5 K/uL   RBC 3.47 (L) 4.22 - 5.81 MIL/uL   Hemoglobin 12.7 (L) 13.0 - 17.0 g/dL   HCT 91.4 (L) 78.2 - 95.6 %   MCV 106.1 (H) 80.0 - 100.0 fL   MCH 36.6 (H) 26.0 - 34.0 pg   MCHC 34.5 30.0 - 36.0 g/dL   RDW 21.3 (H) 08.6 - 57.8 %   Platelets 130 (L) 150 - 400 K/uL   nRBC 0.0 0.0 - 0.2 %    Comment: Performed at [name], [address], [place], [place] [address]   No results found.    Assessment/Plan GLF 4 days ago  L 6-9 rib fxs - no PTX. Small pleural effusion. Multimodal pain control. Pulm toilet with IS and flutter valve. therapies COPD - on O2 at baseline, 3 lpm at rest, 4 lpm with exertion. Home inhalers per TR[facility] HTN HLD Hyponatremia ETOH use - CIWA protocol Rheumatoid arthritis - on methotrexate at baseline Anemia - hgb 12.7. was 15.6 8 mos ago. No tachycardia or hypotension. Not concerning for ongoing loss. Repeat CBC am Constipation - will order bowel regimen Urinary retention - last voided last night. Discussed with RN and patient will attempt to urinate and RN will bladder scan  FEN: reg ID: none indicated VTE: lovenox   Dispo: bowel regimen. Monitor urinary retention. Pain control. therapies  I reviewed ED provider notes, last 24 h vitals and pain scores, last 48 h intake and output, last 24 h labs and trends, and last 24 h imaging results.   Eric Form, [facility] 03/22/2023, 1:57 PM Please see Amion for pager number during day hours 7:00am-4:30pm

## 2023-03-22 NOTE — ED Notes (Signed)
 RT Note: Patient was place don our oxygen at 3-4lpm.  Patient wears 3-4 lpm at home.

## 2023-03-23 ENCOUNTER — Encounter (HOSPITAL_COMMUNITY): Payer: Self-pay | Admitting: Student in an Organized Health Care Education/Training Program

## 2023-03-23 DIAGNOSIS — Z79631 Long term (current) use of antimetabolite agent: Secondary | ICD-10-CM | POA: Diagnosis not present

## 2023-03-23 DIAGNOSIS — J439 Emphysema, unspecified: Secondary | ICD-10-CM | POA: Diagnosis not present

## 2023-03-23 DIAGNOSIS — E871 Hypo-osmolality and hyponatremia: Secondary | ICD-10-CM | POA: Diagnosis not present

## 2023-03-23 DIAGNOSIS — Z800 Family history of malignant neoplasm of digestive organs: Secondary | ICD-10-CM | POA: Diagnosis not present

## 2023-03-23 DIAGNOSIS — S2242XA Multiple fractures of ribs, left side, initial encounter for closed fracture: Secondary | ICD-10-CM | POA: Diagnosis not present

## 2023-03-23 DIAGNOSIS — F1010 Alcohol abuse, uncomplicated: Secondary | ICD-10-CM | POA: Diagnosis not present

## 2023-03-23 DIAGNOSIS — I110 Hypertensive heart disease with heart failure: Secondary | ICD-10-CM | POA: Diagnosis not present

## 2023-03-23 DIAGNOSIS — R339 Retention of urine, unspecified: Secondary | ICD-10-CM | POA: Diagnosis not present

## 2023-03-23 DIAGNOSIS — F1020 Alcohol dependence, uncomplicated: Secondary | ICD-10-CM | POA: Diagnosis not present

## 2023-03-23 DIAGNOSIS — I5032 Chronic diastolic (congestive) heart failure: Secondary | ICD-10-CM | POA: Diagnosis not present

## 2023-03-23 DIAGNOSIS — F411 Generalized anxiety disorder: Secondary | ICD-10-CM | POA: Diagnosis not present

## 2023-03-23 DIAGNOSIS — K5909 Other constipation: Secondary | ICD-10-CM | POA: Diagnosis not present

## 2023-03-23 DIAGNOSIS — J9811 Atelectasis: Secondary | ICD-10-CM | POA: Diagnosis not present

## 2023-03-23 DIAGNOSIS — K5900 Constipation, unspecified: Secondary | ICD-10-CM | POA: Diagnosis not present

## 2023-03-23 DIAGNOSIS — Z79899 Other long term (current) drug therapy: Secondary | ICD-10-CM | POA: Diagnosis not present

## 2023-03-23 DIAGNOSIS — J90 Pleural effusion, not elsewhere classified: Secondary | ICD-10-CM | POA: Diagnosis not present

## 2023-03-23 DIAGNOSIS — M069 Rheumatoid arthritis, unspecified: Secondary | ICD-10-CM | POA: Diagnosis not present

## 2023-03-23 DIAGNOSIS — D649 Anemia, unspecified: Secondary | ICD-10-CM | POA: Diagnosis not present

## 2023-03-23 DIAGNOSIS — F410 Panic disorder [episodic paroxysmal anxiety] without agoraphobia: Secondary | ICD-10-CM | POA: Diagnosis not present

## 2023-03-23 DIAGNOSIS — D508 Other iron deficiency anemias: Secondary | ICD-10-CM | POA: Diagnosis not present

## 2023-03-23 DIAGNOSIS — W010XXA Fall on same level from slipping, tripping and stumbling without subsequent striking against object, initial encounter: Secondary | ICD-10-CM | POA: Diagnosis present

## 2023-03-23 DIAGNOSIS — Y92009 Unspecified place in unspecified non-institutional (private) residence as the place of occurrence of the external cause: Secondary | ICD-10-CM | POA: Diagnosis not present

## 2023-03-23 DIAGNOSIS — J9621 Acute and chronic respiratory failure with hypoxia: Secondary | ICD-10-CM | POA: Diagnosis not present

## 2023-03-23 DIAGNOSIS — Z9981 Dependence on supplemental oxygen: Secondary | ICD-10-CM | POA: Diagnosis not present

## 2023-03-23 DIAGNOSIS — I2510 Atherosclerotic heart disease of native coronary artery without angina pectoris: Secondary | ICD-10-CM | POA: Diagnosis not present

## 2023-03-23 DIAGNOSIS — E7800 Pure hypercholesterolemia, unspecified: Secondary | ICD-10-CM | POA: Diagnosis not present

## 2023-03-23 DIAGNOSIS — Z888 Allergy status to other drugs, medicaments and biological substances status: Secondary | ICD-10-CM | POA: Diagnosis not present

## 2023-03-23 DIAGNOSIS — Z87891 Personal history of nicotine dependence: Secondary | ICD-10-CM | POA: Diagnosis not present

## 2023-03-23 LAB — CBC
HCT: 33.1 % — ABNORMAL LOW (ref 39.0–52.0)
Hemoglobin: 11.4 g/dL — ABNORMAL LOW (ref 13.0–17.0)
MCH: 36.7 pg — ABNORMAL HIGH (ref 26.0–34.0)
MCHC: 34.4 g/dL (ref 30.0–36.0)
MCV: 106.4 fL — ABNORMAL HIGH (ref 80.0–100.0)
Platelets: 104 10*3/uL — ABNORMAL LOW (ref 150–400)
RBC: 3.11 MIL/uL — ABNORMAL LOW (ref 4.22–5.81)
RDW: 15.6 % — ABNORMAL HIGH (ref 11.5–15.5)
WBC: 4.4 10*3/uL (ref 4.0–10.5)
nRBC: 0 % (ref 0.0–0.2)

## 2023-03-23 MED ORDER — OXYCODONE HCL ER 10 MG PO T12A
10.0000 mg | EXTENDED_RELEASE_TABLET | Freq: Two times a day (BID) | ORAL | Status: DC
  Filled 2023-03-23: qty 1

## 2023-03-23 MED ORDER — OXYCODONE HCL 5 MG PO TABS
10.0000 mg | ORAL_TABLET | ORAL | Status: DC | PRN
  Administered 2023-03-23 (×2): 10 mg via ORAL
  Filled 2023-03-23 (×2): qty 2

## 2023-03-23 MED ORDER — HYDROMORPHONE HCL 1 MG/ML IJ SOLN
0.5000 mg | Freq: Four times a day (QID) | INTRAMUSCULAR | Status: DC | PRN
  Administered 2023-03-23: 0.5 mg via INTRAVENOUS
  Filled 2023-03-23: qty 0.5

## 2023-03-23 MED ORDER — OXYCODONE HCL ER 10 MG PO T12A
10.0000 mg | EXTENDED_RELEASE_TABLET | Freq: Two times a day (BID) | ORAL | Status: DC
  Administered 2023-03-23 – 2023-03-24 (×2): 10 mg via ORAL
  Filled 2023-03-23 (×2): qty 1

## 2023-03-23 NOTE — TOC Progression Note (Addendum)
 Transition of Care [facility]) - Progression Note    Patient Details  Name: [name] [name] MRN: [id number] Date of Birth: 1945/10/23  Transition of Care [facility]) CM/SW Contact  Ronny Bacon, RN Phone Number: 03/23/2023, 2:33 PM  Clinical Narrative:   Patient with possible discharge tomorrow. Spoke with patient by phone, agreeable to [facility] therapy. Patient request to return to [facility], message sent to [facility] with [facility] awaiting response.  1443: Heather with [facility] accepted patient. Will need HH orders with face to face.      Barriers to Discharge: Continued Medical Work up  Expected Discharge Plan and Services                                               Social Determinants of Health (SDOH) Interventions SDOH Screenings   Food Insecurity: Patient Declined (03/23/2023)  Housing: Patient Declined (03/23/2023)  Transportation Needs: Patient Declined (03/23/2023)  Utilities: Patient Declined (03/23/2023)  Depression (PHQ2-9): Low Risk  (03/09/2019)  Social Connections: Patient Declined (03/23/2023)  Tobacco Use: Medium Risk (03/23/2023)    Readmission Risk Interventions    07/08/2022   10:55 AM  Readmission Risk Prevention Plan  Post Dischage Appt Complete  Medication Screening Complete  Transportation Screening Complete

## 2023-03-23 NOTE — Consult Note (Signed)
 Reason for Consult:  multiple left rib fractures, ground level fall  Referring Physician: Dr. Jacqulyn Bath, [facility]  [name] [name] is an 78 y.o. male.   HPI: Patient is a 78 yo WM who tripped and fell at home, striking the left chest against the end of his sofa.  Evaluation includes CXR and chest CT scan demonstrating multiple left rib fractures (#6-9, minimally displaced) with a small pleural effusion.  Patient admitted to the medical service for pain control.  Trauma surgery asked to evaluate.  Past Medical History:  Diagnosis Date   COPD (chronic obstructive pulmonary disease) (HCC)    High cholesterol    Hypertension     Past Surgical History:  Procedure Laterality Date   NASAL SINUS SURGERY  2007    Family History  Problem Relation Age of Onset   Esophageal cancer Father     Social History:  reports that he quit smoking about 15 years ago. His smoking use included cigarettes. He started smoking about 55 years ago. He has a 80 pack-year smoking history. He has never been exposed to tobacco smoke. He has never used smokeless tobacco. He reports current alcohol use of about 12.0 standard drinks of alcohol per week. He reports that he does not use drugs.  Allergies:  Allergies  Allergen Reactions   Fluticasone-Salmeterol Shortness Of Breath and Other (See Comments)    "Worsening shortness of breath.."    Medications: I have reviewed the patient's current medications.  Results for orders placed or performed during the hospital encounter of 03/22/23 (from the past 48 hours)  Basic metabolic panel     Status: Abnormal   Collection Time: 03/22/23 11:45 AM  Result Value Ref Range   Sodium 133 (L) 135 - 145 mmol/L   Potassium 3.6 3.5 - 5.1 mmol/L   Chloride 91 (L) 98 - 111 mmol/L   CO2 30 22 - 32 mmol/L   Glucose, Bld 100 (H) 70 - 99 mg/dL    Comment: Glucose reference range applies only to samples taken after fasting for at least 8 hours.   BUN 12 8 - 23  mg/dL   Creatinine, Ser 9.14 0.61 - 1.24 mg/dL   Calcium 9.0 8.9 - 78.2 mg/dL   GFR, Estimated >95 >62 mL/min    Comment: (NOTE) Calculated using the CKD-EPI Creatinine Equation (2021)    Anion gap 12 5 - 15    Comment: Performed at [name], [address], [place], [place] [address]  CBC     Status: Abnormal   Collection Time: 03/22/23 11:45 AM  Result Value Ref Range   WBC 6.4 4.0 - 10.5 K/uL   RBC 3.47 (L) 4.22 - 5.81 MIL/uL   Hemoglobin 12.7 (L) 13.0 - 17.0 g/dL   HCT 57.8 (L) 46.9 - 62.9 %   MCV 106.1 (H) 80.0 - 100.0 fL   MCH 36.6 (H) 26.0 - 34.0 pg   MCHC 34.5 30.0 - 36.0 g/dL   RDW 52.8 (H) 41.3 - 24.4 %   Platelets 130 (L) 150 - 400 K/uL   nRBC 0.0 0.0 - 0.2 %    Comment: Performed at [name], [address] [place], [place] [address]  CBC     Status: Abnormal   Collection Time: 03/22/23  4:33 PM  Result Value Ref Range   WBC 6.7 4.0 - 10.5 K/uL   RBC 3.41 (L) 4.22 - 5.81 MIL/uL   Hemoglobin 12.5 (L) 13.0 - 17.0 g/dL  HCT 36.1 (L) 39.0 - 52.0 %   MCV 105.9 (H) 80.0 - 100.0 fL   MCH 36.7 (H) 26.0 - 34.0 pg   MCHC 34.6 30.0 - 36.0 g/dL   RDW 16.1 (H) 09.6 - 04.5 %   Platelets 124 (L) 150 - 400 K/uL    Comment: REPEATED TO VERIFY   nRBC 0.0 0.0 - 0.2 %    Comment: Performed at [facility], [address], [place], [place] [address]  Creatinine, serum     Status: None   Collection Time: 03/22/23  4:33 PM  Result Value Ref Range   Creatinine, Ser 0.83 0.61 - 1.24 mg/dL   GFR, Estimated >19 >14 mL/min    Comment: (NOTE) Calculated using the CKD-EPI Creatinine Equation (2021) Performed at [facility], [address], [place], [place] [address]   CBC     Status: Abnormal   Collection Time: 03/23/23  8:09 AM  Result Value Ref Range   WBC 4.4 4.0 - 10.5 K/uL   RBC 3.11 (L) 4.22 - 5.81 MIL/uL   Hemoglobin 11.4 (L) 13.0 - 17.0 g/dL   HCT 62.1 (L) 30.8 - 65.7 %   MCV 106.4 (H) 80.0 - 100.0 fL   MCH  36.7 (H) 26.0 - 34.0 pg   MCHC 34.4 30.0 - 36.0 g/dL   RDW 84.6 (H) 96.2 - 95.2 %   Platelets 104 (L) 150 - 400 K/uL    Comment: REPEATED TO VERIFY   nRBC 0.0 0.0 - 0.2 %    Comment: Performed at [facility], [address], [place], [place] [address]    CT Chest Wo Contrast Result Date: 03/22/2023 CLINICAL DATA:  Blunt chest Trauma EXAM: CT CHEST WITHOUT CONTRAST TECHNIQUE: Multidetector CT imaging of the chest was performed following the standard protocol without IV contrast. RADIATION DOSE REDUCTION: This exam was performed according to the departmental dose-optimization program which includes automated exposure control, adjustment of the mA and/or kV according to patient size and/or use of iterative reconstruction technique. COMPARISON:  Radiography 03/20/2023.  Chest CT 10/04/2022. FINDINGS: Cardiovascular: Heart size upper limits of normal. No pericardial fluid. Coronary artery calcification and aortic atherosclerotic calcification are present. Mediastinum/Nodes: No mediastinal or hilar mass or lymphadenopathy. Lungs/Pleura: Upper lobe predominant emphysema and pulmonary scarring as seen previously. No evidence of active pneumonia or developing mass lesion. Chronic scarring/volume loss in the lingula. Small amount of layering pleural fluid on the left with dependent atelectasis. No pneumothorax. Upper Abdomen: Negative Musculoskeletal: Nondisplaced fracture of the left lateral sixth rib. Minimally displaced fracture of the left lateral seventh rib. Mildly displaced and overlapped fracture of the left lateral eighth rib. Minimally displaced fracture of the left ninth rib. No sternal fracture. No spinal fracture. IMPRESSION: 1. Fractures of the left lateral sixth through ninth ribs. Small amount of layering pleural fluid on the left with dependent atelectasis. No pneumothorax. 2. Upper lobe predominant emphysema and pulmonary scarring as seen previously. 3. Coronary artery calcification and  aortic atherosclerotic calcification. Electronically Signed   By: Paulina Fusi M.D.   On: 03/22/2023 14:37    Review of Systems  Constitutional: Negative.   HENT: Negative.    Eyes: Negative.   Respiratory:  Positive for shortness of breath.   Cardiovascular: Negative.   Gastrointestinal: Negative.   Endocrine: Negative.   Genitourinary: Negative.   Musculoskeletal: Negative.   Skin: Negative.   Allergic/Immunologic: Negative.   Neurological: Negative.   Hematological: Negative.   Psychiatric/Behavioral: Negative.  Blood pressure 126/62, pulse 93, temperature 97.6 F (36.4 C), temperature source Oral, resp. rate 16, height 5\' 11"  (1.803 m), weight 94.9 kg, SpO2 100%.  CONSTITUTIONAL: no acute distress; conversant; no obvious deformities  EYES: Conjunctiva clear and moist; pupils equal bilaterally  NECK: trachea midline; no thyroid nodularity  LUNGS: respiratory effort normal & unlabored; no wheeze; no rales; mild tachypnea; no crepitance; tenderness left lateral chest wall; no ecchymosis  CV: rate and rhythm regular; no significant murmur; no edema bilat lower extremities  GI: abdomen soft, obese, non-tender  MSK: normal range of motion of extremities; no clubbing; no cyanosis  PSYCH: appropriate affect for situation; alert and oriented to person, place, & time  LYMPHATIC: no palpable cervical lymphadenopathy   Assessment/Plan: Ground level fall Multiple left rib fractures, minimally displaced (#6-9) Small left pleural effusion COPD  Agree with admission to medical service.  Pain control.  Encouraged pulmonary toilet - IS at bedside.  Will follow.  Repeat CXR in AM 3/16.  [name], MD [facility] A [facility] practice Office: [phone]   [name] 03/23/2023, 9:32 AM

## 2023-03-23 NOTE — Evaluation (Signed)
 Physical Therapy Evaluation Patient Details Name: [name] [name] MRN: [id number] DOB: 18-Apr-1945 Today's Date: 03/23/2023  History of Present Illness  Pt is a 78 y.o. male presenting 3/14 with shortness of breath and SpO2 82%. Of note, ED visit 3/12 with L rib pain after a fall on 3/10 in which he was found to have mildly displaced 8th rib fx. Upon return admission, CT chest with L6-9 rib fractures, small pleural effusion and dependent atelctasis. 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   Patient will benefit from continued PT for gait and mobility for hopeful return to home in a few days. Recommend HHPT at discharge.        If plan is discharge home, recommend the following: A little help with walking and/or transfers;A little help with bathing/dressing/bathroom;Assistance with cooking/housework;Assist for transportation   Can travel by private vehicle        Equipment Recommendations None recommended by PT  Recommendations for Other Services       Functional Status Assessment Patient has had a recent decline in their functional status and demonstrates the ability to make significant improvements in function in a reasonable and predictable amount of time.     Precautions / Restrictions Precautions Precautions: Fall      Mobility  Bed Mobility Overal bed mobility: Needs Assistance Bed Mobility: Supine to Sit, Sit to Supine     Supine to sit: HOB elevated, Supervision, Used rails Sit to supine: Supervision, HOB elevated, Used rails   General bed mobility comments: encouraged log roll technique but patient prefers to "do it all at once".    Transfers Overall transfer level: Needs assistance Equipment used: Rolling walker (2  wheels) Transfers: Sit to/from Stand Sit to Stand: Contact guard assist           General transfer comment: for safety. good hand placement up from EOB    Ambulation/Gait Ambulation/Gait assistance: Contact guard assist Gait Distance (Feet): 60 Feet Assistive device: Rolling walker (2 wheels) Gait Pattern/deviations: Step-through pattern Gait velocity: decreased     General Gait Details: required 2 rest breaks for SOB  Stairs            Wheelchair Mobility     Tilt Bed    Modified Rankin (Stroke Patients Only)       Balance Overall balance assessment: Needs assistance Sitting-balance support: No upper extremity supported, Feet supported Sitting balance-Leahy Scale: Fair     Standing balance support: No upper extremity supported, During functional activity, Reliant on assistive device for balance Standing balance-Leahy Scale: Fair                               Pertinent Vitals/Pain Pain Assessment Pain Assessment: Faces Pain Score: 5  Pain Location: L ribs Pain Descriptors / Indicators: Tender, Sore, Grimacing, Guarding Pain Intervention(s): Limited activity within patient's tolerance, Monitored during session, Patient requesting pain meds-RN notified, RN gave pain meds during session    Home Living Family/patient expects to be discharged to:: Private residence Living Arrangements: Children (daughter stays with pt for the past few months) Available Help at Discharge: Family;Available PRN/intermittently (son also in town and pt has some friends who can help out) Type of Home: Apartment Home Access: Level entry  Home Layout: One level Home Equipment: Grab bars - toilet;Grab bars - tub/shower;Cane - single Librarian, academic (2 wheels);Rollator (4 wheels);Shower seat;Hand held shower head;Adaptive equipment Additional Comments: on 3L home O2; children are local and can help at times    Prior Function Prior Level of Function :  Independent/Modified Independent;Driving             Mobility Comments: SPC out of apt and in apt off and on ADLs Comments: 2xx/week bathes, otherwise sponge bathes, dresses self. Programmer, applications 1x/month, splits other cleaning with daughter     Extremity/Trunk Assessment   Upper Extremity Assessment Upper Extremity Assessment: Defer to OT evaluation    Lower Extremity Assessment Lower Extremity Assessment: Generalized weakness    Cervical / Trunk Assessment Cervical / Trunk Assessment: Kyphotic  Communication   Communication Communication: No apparent difficulties    Cognition Arousal: Alert Behavior During Therapy: WFL for tasks assessed/performed   PT - Cognitive impairments: Safety/Judgement                       PT - Cognition Comments: seems slightly impulsive Following commands: Intact       Cueing Cueing Techniques: Verbal cues     General Comments      Exercises     Assessment/Plan    PT Assessment Patient needs continued PT services  PT Problem List Decreased activity tolerance;Decreased balance;Decreased mobility;Decreased safety awareness;Cardiopulmonary status limiting activity       PT Treatment Interventions DME instruction;Gait training;Functional mobility training;Therapeutic activities;Balance training;Patient/family education    PT Goals (Current goals can be found in the Care Plan section)  Acute Rehab PT Goals Patient Stated Goal: I don't think I am ready to go home PT Goal Formulation: With patient Time For Goal Achievement: 03/30/23 Potential to Achieve Goals: Good    Frequency Min 3X/week     Co-evaluation               AM-PAC PT "6 Clicks" Mobility  Outcome Measure Help needed turning from your back to your side while in a flat bed without using bedrails?: A Little Help needed moving from lying on your back to sitting on the side of a flat bed without using bedrails?: A Little Help needed moving to and from  a bed to a chair (including a wheelchair)?: A Little Help needed standing up from a chair using your arms (e.g., wheelchair or bedside chair)?: A Little Help needed to walk in hospital room?: A Little Help needed climbing 3-5 steps with a railing? : A Little 6 Click Score: 18    End of Session Equipment Utilized During Treatment: Oxygen Activity Tolerance: Patient tolerated treatment well Patient left: in bed;with call bell/phone within reach;with bed alarm set Nurse Communication: Mobility status PT Visit Diagnosis: Unsteadiness on feet (R26.81);Other abnormalities of gait and mobility (R26.89);History of falling (Z91.81);Muscle weakness (generalized) (M62.81)    Time: 1315-1400 PT Time Calculation (min) (ACUTE ONLY): 45 min   Charges:   PT Evaluation $PT Eval Moderate Complexity: 1 Mod PT Treatments $Gait Training: 8-22 mins PT General Charges $$ ACUTE PT VISIT: 1 Visit         03/23/2023 Delray Alt, PT Acute Rehabilitation Services Office:  [phone]   Olivia Canter 03/23/2023, 2:11 PM

## 2023-03-23 NOTE — Care Management Obs Status (Signed)
 MEDICARE OBSERVATION STATUS NOTIFICATION   Patient Details  Name: [name] [name] MRN: [id number] Date of Birth: [birth year]   Medicare Observation Status Notification Given:  Yes    Ronny Bacon, RN 03/23/2023, 9:14 AM

## 2023-03-23 NOTE — Progress Notes (Signed)
 PROGRESS NOTE    [name] [name]  BJY:[id number] DOB: 07-Aug-1945 DOA: 03/22/2023 PCP: Chilton Greathouse, MD   Brief Narrative:  HPI: [name] [name] is a 78 y.o. 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  According to patient, he fell 3/10 - ground level fall that occurred when he tripped over his oxygen tubing and struck his left side against the arm of his couch. He also tore the skin on his left hand. He denies head injury or LOC. He was evaluated in ED 2 days later on 3/12 and found to have a rib fracture. He was offered admission but declined. He was discharged with IS and oxycodone for pain control and told to follow up with PCP. Since that time he has had worsening pain and shortness of breath at home requiring increasing amounts of O2. He has baseline intermittent cough. He has constipation intermittently at baseline for which he takes OTC medications. Last BM was Tuesday. He has had difficulty urinating since Wednesday. Last urinated last night. He denies fever, chills, urinary symptoms, n/v/abdominal pain.  He is a former smoker but drinks alcohol daily-12 beers per week-2-3 drinks per day.  No history of withdrawal.   ED Course: Upon arrival to ED, he was hemodynamically stable, hypoxic requiring 4 L of oxygen to maintain oxygen saturation over 90%.  CT chest shows fractures of the left 6th through 9th ribs with small pleural effusion and dependent atelectasis.  Upper lobe emphysema and pulmonary scarring.  Coronary artery calcification.  Patient received few doses of IV pain medications.  Trauma surgery was consulted who recommended admission for observation under hospitalist service.  TRH was then consulted for admission and patient was transferred to  [facility].  Assessment & Plan:   Principal Problem:   Rib fracture  Mechanical ground-level fall/left sided multiple rib fractures/left-sided atelectasis with small pleural effusion: No pneumothorax.  Currently IV Dilaudid as needed, oral Roxicodone and muscle relaxants.  He says that his pain is not controlled however he appears extremely comfortable.  Talking to OT, there was no restriction of left arm mobility as typically is expected in patient with left rib fractures.  Sounds like his pain is more psychological.  Regardless, I have started him on long-acting oxycodone 10 mg every 12 hours and increased the dose of short acting oxycodone to 10 mg as well and reduce frequency of Dilaudid.  He is using incentive spirometry only 2-3 times a day despite of extensive counseling yesterday and advising him to use every day.  I spent more time today counseling about this.,  Surgeries repeating x-ray tomorrow.  Plan for potential discharge tomorrow.   Acute on chronic hypoxic respiratory failure: Uses 3 to 4 L oxygen at home, required 5 L upon arrival but now back to baseline.  Encouraged I-S/flutter valve.   COPD: Not in exacerbation.  Continue current medications.   Hypertension: Blood pressure controlled.  Continue home medications.   Hyperlipidemia: Resume statin.   EtOH dependence: No signs of withdrawal, continue CIWA protocol with as needed Ativan.   Rheumatoid arthritis: On methotrexate at baseline.   Acute anemia: Hemoglobin 12.7, was 15.6 just 8 months ago.  No reports of melena, hematochezia or hematemesis.  Hemoglobin stable.   Urinary retention: Resolved.   Anxiety:  Resume Zoloft and Xanax.   Chronic diastolic congestive heart failure: Last echo in June 2024 shows normal ejection fraction and grade 1 diastolic dysfunction.  Will continue home dose of Lasix.  DVT prophylaxis: enoxaparin (LOVENOX) injection 40 mg Start: 03/22/23 1800   Code Status: Full Code  Family  Communication:  None present at bedside.  Plan of care discussed with patient in length and he/she verbalized understanding and agreed with it.  Status is: Observation The patient will require care spanning > 2 midnights and should be moved to inpatient because: Still with pain   Estimated body mass index is 29.18 kg/m as calculated from the following:   Height as of this encounter: 5\' 11"  (1.803 m).   Weight as of this encounter: 94.9 kg.    Nutritional Assessment: Body mass index is 29.18 kg/m.Marland Kitchen Seen by dietician.  I agree with the assessment and plan as outlined below: Nutrition Status:        . Skin Assessment: I have examined the patient's skin and I agree with the wound assessment as performed by the wound care RN as outlined below:    Consultants:  Trauma surgery  Procedures:  None  Antimicrobials:  Anti-infectives (From admission, onward)    None         Subjective: Patient seen and examined, still complains of pain but appears very comfortable.  No other complaint.  Objective: Vitals:   03/22/23 1623 03/22/23 2039 03/22/23 2305 03/23/23 0549  BP: 133/63 120/62 135/89 126/62  Pulse: 93 93    Resp: 18 18 18 16   Temp: 97.8 F (36.6 C) 97.9 F (36.6 C) 97.8 F (36.6 C) 97.6 F (36.4 C)  TempSrc:  Oral Oral Oral  SpO2: 95% 92% 99% 100%  Weight:      Height:        Intake/Output Summary (Last 24 hours) at 03/23/2023 1018 Last data filed at 03/22/2023 2040 Gross per 24 hour  Intake --  Output 300 ml  Net -300 ml   Filed Weights   03/22/23 1121  Weight: 94.9 kg    Examination:  General exam: Appears calm and comfortable  Respiratory system: Diminished breath sounds at the bases only, much improved compared to yesterday. Cardiovascular system: S1 & S2 heard, RRR. No JVD, murmurs, rubs, gallops or clicks. No pedal edema. Gastrointestinal system: Abdomen is nondistended, soft and nontender. No organomegaly or masses felt. Normal bowel sounds  heard. Central nervous system: Alert and oriented. No focal neurological deficits. Extremities: Symmetric 5 x 5 power. Skin: No rashes, lesions or ulcers Psychiatry: Judgement and insight appear normal. Mood & affect appropriate.    Data Reviewed: I have personally reviewed following labs and imaging studies  CBC: Recent Labs  Lab 03/22/23 1145 03/22/23 1633 03/23/23 0809  WBC 6.4 6.7 4.4  HGB 12.7* 12.5* 11.4*  HCT 36.8* 36.1* 33.1*  MCV 106.1* 105.9* 106.4*  PLT 130* 124* 104*   Basic Metabolic Panel: Recent Labs  Lab 03/22/23 1145 03/22/23 1633  NA 133*  --   K 3.6  --   CL 91*  --   CO2 30  --   GLUCOSE 100*  --   BUN 12  --   CREATININE 0.71 0.83  CALCIUM 9.0  --    GFR: Estimated Creatinine Clearance: 90.4 mL/min (by C-G formula based on SCr of 0.83 mg/dL). Liver Function Tests: No results for input(s): "AST", "ALT", "ALKPHOS", "BILITOT", "PROT", "ALBUMIN" in the last 168 hours. No results for input(s): "LIPASE", "AMYLASE"  in the last 168 hours. No results for input(s): "AMMONIA" in the last 168 hours. Coagulation Profile: No results for input(s): "INR", "PROTIME" in the last 168 hours. Cardiac Enzymes: No results for input(s): "CKTOTAL", "CKMB", "CKMBINDEX", "TROPONINI" in the last 168 hours. BNP (last 3 results) No results for input(s): "PROBNP" in the last 8760 hours. HbA1C: No results for input(s): "HGBA1C" in the last 72 hours. CBG: No results for input(s): "GLUCAP" in the last 168 hours. Lipid Profile: No results for input(s): "CHOL", "HDL", "LDLCALC", "TRIG", "CHOLHDL", "LDLDIRECT" in the last 72 hours. Thyroid Function Tests: No results for input(s): "TSH", "T4TOTAL", "FREET4", "T3FREE", "THYROIDAB" in the last 72 hours. Anemia Panel: No results for input(s): "VITAMINB12", "FOLATE", "FERRITIN", "TIBC", "IRON", "RETICCTPCT" in the last 72 hours. Sepsis Labs: No results for input(s): "PROCALCITON", "LATICACIDVEN" in the last 168 hours.  No  results found for this or any previous visit (from the past 240 hours).   Radiology Studies: CT Chest Wo Contrast Result Date: 03/22/2023 CLINICAL DATA:  Blunt chest Trauma EXAM: CT CHEST WITHOUT CONTRAST TECHNIQUE: Multidetector CT imaging of the chest was performed following the standard protocol without IV contrast. RADIATION DOSE REDUCTION: This exam was performed according to the departmental dose-optimization program which includes automated exposure control, adjustment of the mA and/or kV according to patient size and/or use of iterative reconstruction technique. COMPARISON:  Radiography 03/20/2023.  Chest CT 10/04/2022. FINDINGS: Cardiovascular: Heart size upper limits of normal. No pericardial fluid. Coronary artery calcification and aortic atherosclerotic calcification are present. Mediastinum/Nodes: No mediastinal or hilar mass or lymphadenopathy. Lungs/Pleura: Upper lobe predominant emphysema and pulmonary scarring as seen previously. No evidence of active pneumonia or developing mass lesion. Chronic scarring/volume loss in the lingula. Small amount of layering pleural fluid on the left with dependent atelectasis. No pneumothorax. Upper Abdomen: Negative Musculoskeletal: Nondisplaced fracture of the left lateral sixth rib. Minimally displaced fracture of the left lateral seventh rib. Mildly displaced and overlapped fracture of the left lateral eighth rib. Minimally displaced fracture of the left ninth rib. No sternal fracture. No spinal fracture. IMPRESSION: 1. Fractures of the left lateral sixth through ninth ribs. Small amount of layering pleural fluid on the left with dependent atelectasis. No pneumothorax. 2. Upper lobe predominant emphysema and pulmonary scarring as seen previously. 3. Coronary artery calcification and aortic atherosclerotic calcification. Electronically Signed   By: Paulina Fusi M.D.   On: 03/22/2023 14:37    Scheduled Meds:  acetaminophen  1,000 mg Oral Q6H   amLODipine   5 mg Oral Daily   atorvastatin  20 mg Oral Daily   docusate sodium  100 mg Oral BID   enoxaparin (LOVENOX) injection  40 mg Subcutaneous Q24H   folic acid  1 mg Oral Daily   furosemide  40 mg Oral q AM   ipratropium-albuterol  3 mL Nebulization Once   lidocaine  1 patch Transdermal Q24H   multivitamin with minerals  1 tablet Oral Daily   oxyCODONE  10 mg Oral Q12H   polyethylene glycol  17 g Oral BID   senna  1 tablet Oral Daily   sertraline  100 mg Oral q AM   thiamine  100 mg Oral Daily   Or   thiamine  100 mg Intravenous Daily   Continuous Infusions:   LOS: 0 days   Hughie Closs, MD [facility]  03/23/2023, 10:18 AM   *Please note that this is a verbal dictation therefore any spelling or grammatical errors are due to the "[employer] One" system  interpretation.  Please page via Amion and do not message via secure chat for urgent patient care matters. Secure chat can be used for non urgent patient care matters.  How to contact the [facility] Attending or Consulting provider 7A - 7P or covering provider during after hours 7P -7A, for this patient?  Check the care team in [facility] and look for a) attending/consulting [facility] provider listed and b) the [facility] team listed. Page or secure chat 7A-7P. Log into [email or web address] and use [place]'s universal password to access. If you do not have the password, please contact the hospital operator. Locate the [facility] provider you are looking for under [facility] and page to a number that you can be directly reached. If you still have difficulty reaching the provider, please page the [facility] (Director on Call) for the Hospitalists listed on amion for assistance.

## 2023-03-23 NOTE — Progress Notes (Signed)
 Occupational Therapy Treatment Patient Details Name: [name] [name] MRN: [id number] DOB: 07-Jun-1945 Today's Date: 03/23/2023   History of present illness Pt is a 78 y.o. male presenting 3/14 with shortness of breath and SpO2 82%. Of note, ED visit 3/12 with L rib pain after a fall on 3/10 in which he was found to have mildly displaced 8th rib fx. Upon return admission, CT chest with L6-9 rib fractures, small pleural effusion and dependent atelctasis. 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 Upon eval, pt with L rib pain and decreased cardiopulmonary status. Pt required min A for LB ADL at eval and CGA for transfers and short distance gait as well as standing grooming tasks. Frequently benefiting from encouragement secondary to his pain. Pt to continue to benefit from acute OT services and recommending discharge home with [facility] and intermittent assist from family/friends.       If plan is discharge home, recommend the following:  A little help with walking and/or transfers;A little help with bathing/dressing/bathroom;Assistance with cooking/housework;Assist for transportation;Help with stairs or ramp for entrance   Equipment Recommendations  None recommended by OT    Recommendations for Other Services      Precautions / Restrictions Precautions Precautions: Fall Restrictions Weight Bearing Restrictions Per Provider Order: No       Mobility Bed Mobility Overal bed mobility: Needs Assistance Bed Mobility: Supine to Sit     Supine to sit: Min assist, HOB elevated     General bed mobility comments: min A for truncal elevation. encouraged use of pillow on L  to brace rib area. Could benefit from further education for log roll technique.    Transfers Overall transfer level: Needs  assistance Equipment used: Rolling walker (2 wheels) Transfers: Sit to/from Stand Sit to Stand: Contact guard assist           General transfer comment: for safety. good hand placement up from EOB     Balance Overall balance assessment: Needs assistance Sitting-balance support: No upper extremity supported, Feet supported Sitting balance-Leahy Scale: Fair     Standing balance support: No upper extremity supported, During functional activity Standing balance-Leahy Scale: Fair Standing balance comment: statically. Benefits from UE support dynamically                           ADL either performed or assessed with clinical judgement   ADL Overall ADL's : Needs assistance/impaired Eating/Feeding: Independent;Sitting   Grooming: Contact guard assist;Standing;Wash/dry hands   Upper Body Bathing: Set up;Sitting   Lower Body Bathing: Sit to/from stand;Contact guard assist   Upper Body Dressing : Set up;Sitting   Lower Body Dressing: Minimal assistance Lower Body Dressing Details (indicate cue type and reason): secondary to dyspnea with leaning forward to feet. Does have sketchers slip in shoes he wears Toilet Transfer: Contact guard assist;Rolling walker (2 wheels);Ambulation   Toileting- Clothing Manipulation and Hygiene: Contact guard assist       Functional mobility during ADLs: Contact guard assist;Rolling walker (2 wheels)      Extremity/Trunk Assessment Upper Extremity Assessment Upper Extremity Assessment: LUE deficits/detail LUE Deficits / Details: painful ROM at shoulder level   Lower Extremity Assessment Lower Extremity Assessment: Defer to PT evaluation        Vision Patient Visual  Report: No change from baseline Vision Assessment?: No apparent visual deficits   Perception Perception Perception: Not tested   Praxis Praxis Praxis: Not tested   Communication Communication Communication: No apparent difficulties   Cognition Arousal:  Alert Behavior During Therapy: WFL for tasks assessed/performed Cognition: No apparent impairments             OT - Cognition Comments: follows commands WFL; some decreased insight noted.                 Following commands: Intact        Cueing   Cueing Techniques: Verbal cues  Exercises      Shoulder Instructions       General Comments SpO2 86% on 3L on arrival despite pt pursed lip breathing; incresaed to 4L and SpO2 90; once EOB up to 91%. Upon standing 96% SpO2; seems to improve with upright posture; then desat back down to 90 with exertion on 4L; recovering with seated rest.    Pertinent Vitals/ Pain       Pain Assessment Pain Assessment: 0-10 Pain Score: 5  Pain Location: L ribs Pain Descriptors / Indicators: Tender, Sore, Grimacing, Guarding Pain Intervention(s): Monitored during session, Limited activity within patient's tolerance  Home Living Family/patient expects to be discharged to:: Private residence Living Arrangements:  (daughter stays with pt for the past few months) Available Help at Discharge: Family;Available PRN/intermittently (son also in town and pt has some friends who can help out) Type of Home: Apartment Home Access: Level entry     Home Layout: One level     Bathroom Shower/Tub: Chief Strategy Officer: Handicapped height (in Estate agent)     Home Equipment: Grab bars - toilet;Grab bars - tub/shower;Cane - single Librarian, academic (2 wheels);Rollator (4 wheels);Shower seat;Hand held shower head;Adaptive equipment Adaptive Equipment: Reacher Additional Comments: on 3L home O2; children are local and can help at times      Prior Functioning/Environment              Frequency  Min 1X/week        Progress Toward Goals  OT Goals(current goals can now be found in the care plan section)     Acute Rehab OT Goals Patient Stated Goal: go home OT Goal Formulation: With patient Time For Goal Achievement:  04/06/23 Potential to Achieve Goals: Good  Plan      Co-evaluation                 AM-PAC OT "6 Clicks" Daily Activity     Outcome Measure   Help from another person eating meals?: None Help from another person taking care of personal grooming?: A Little Help from another person toileting, which includes using toliet, bedpan, or urinal?: A Little Help from another person bathing (including washing, rinsing, drying)?: A Little Help from another person to put on and taking off regular upper body clothing?: A Little Help from another person to put on and taking off regular lower body clothing?: A Little 6 Click Score: 19    End of Session Equipment Utilized During Treatment: Gait belt;Rolling walker (2 wheels)  OT Visit Diagnosis: Unsteadiness on feet (R26.81);Muscle weakness (generalized) (M62.81);Pain Pain - Right/Left: Left Pain - part of body:  (ribs)   Activity Tolerance Patient tolerated treatment well   Patient Left in chair;with chair alarm set   Nurse Communication Mobility status        Time: 9381-8299 OT Time Calculation (min): 34 min  Charges:  OT General Charges $OT Visit: 1 Visit OT Evaluation $OT Eval Moderate Complexity: 1 Mod OT Treatments $Self Care/Home Management : 8-22 mins  Tyler Deis, OTR/L [facility] Acute Rehabilitation Office: [phone]   Myrla Halsted 03/23/2023, 11:11 AM

## 2023-03-24 ENCOUNTER — Inpatient Hospital Stay (HOSPITAL_COMMUNITY)

## 2023-03-24 DIAGNOSIS — S2242XA Multiple fractures of ribs, left side, initial encounter for closed fracture: Secondary | ICD-10-CM | POA: Diagnosis not present

## 2023-03-24 LAB — CBC WITH DIFFERENTIAL/PLATELET
Abs Immature Granulocytes: 0.02 10*3/uL (ref 0.00–0.07)
Basophils Absolute: 0 10*3/uL (ref 0.0–0.1)
Basophils Relative: 0 %
Eosinophils Absolute: 0.1 10*3/uL (ref 0.0–0.5)
Eosinophils Relative: 2 %
HCT: 37.3 % — ABNORMAL LOW (ref 39.0–52.0)
Hemoglobin: 12.7 g/dL — ABNORMAL LOW (ref 13.0–17.0)
Immature Granulocytes: 0 %
Lymphocytes Relative: 20 %
Lymphs Abs: 1.2 10*3/uL (ref 0.7–4.0)
MCH: 37 pg — ABNORMAL HIGH (ref 26.0–34.0)
MCHC: 34 g/dL (ref 30.0–36.0)
MCV: 108.7 fL — ABNORMAL HIGH (ref 80.0–100.0)
Monocytes Absolute: 1 10*3/uL (ref 0.1–1.0)
Monocytes Relative: 18 %
Neutro Abs: 3.4 10*3/uL (ref 1.7–7.7)
Neutrophils Relative %: 60 %
Platelets: 130 10*3/uL — ABNORMAL LOW (ref 150–400)
RBC: 3.43 MIL/uL — ABNORMAL LOW (ref 4.22–5.81)
RDW: 15.7 % — ABNORMAL HIGH (ref 11.5–15.5)
WBC: 5.7 10*3/uL (ref 4.0–10.5)
nRBC: 0 % (ref 0.0–0.2)

## 2023-03-24 LAB — BASIC METABOLIC PANEL
Anion gap: 11 (ref 5–15)
BUN: 7 mg/dL — ABNORMAL LOW (ref 8–23)
CO2: 30 mmol/L (ref 22–32)
Calcium: 9.1 mg/dL (ref 8.9–10.3)
Chloride: 93 mmol/L — ABNORMAL LOW (ref 98–111)
Creatinine, Ser: 0.75 mg/dL (ref 0.61–1.24)
GFR, Estimated: >60 mL/min (ref >60)
Glucose, Bld: 96 mg/dL (ref 70–99)
Potassium: 3.7 mmol/L (ref 3.5–5.1)
Sodium: 134 mmol/L — ABNORMAL LOW (ref 135–145)

## 2023-03-24 MED ORDER — FLUTICASONE FUROATE-VILANTEROL 100-25 MCG/ACT IN AEPB
1.0000 | INHALATION_SPRAY | Freq: Every day | RESPIRATORY_TRACT | Status: DC
  Administered 2023-03-24: 1 via RESPIRATORY_TRACT
  Filled 2023-03-24: qty 28

## 2023-03-24 MED ORDER — BUDESON-GLYCOPYRROL-FORMOTEROL 160-9-4.8 MCG/ACT IN AERO: 2.0000 | INHALATION_SPRAY | Freq: Two times a day (BID) | RESPIRATORY_TRACT | Status: DC

## 2023-03-24 MED ORDER — METHOCARBAMOL 500 MG PO TABS: 500.0000 mg | ORAL_TABLET | Freq: Three times a day (TID) | ORAL | 0 refills | Status: AC | PRN

## 2023-03-24 MED ORDER — OXYCODONE HCL 10 MG PO TABS: 10.0000 mg | ORAL_TABLET | Freq: Three times a day (TID) | ORAL | 0 refills | Status: DC | PRN

## 2023-03-24 MED ORDER — POLYETHYLENE GLYCOL 3350 17 G PO PACK: 17.0000 g | PACK | Freq: Every day | ORAL | Status: DC

## 2023-03-24 MED ORDER — UMECLIDINIUM BROMIDE 62.5 MCG/ACT IN AEPB
1.0000 | INHALATION_SPRAY | Freq: Every day | RESPIRATORY_TRACT | Status: DC
  Filled 2023-03-24: qty 7

## 2023-03-24 MED ORDER — ALPRAZOLAM 0.25 MG PO TABS
0.2500 mg | ORAL_TABLET | Freq: Once | ORAL | Status: AC
  Administered 2023-03-24: 0.25 mg via ORAL
  Filled 2023-03-24: qty 1

## 2023-03-24 MED ORDER — OXYCODONE HCL ER 10 MG PO T12A: 10.0000 mg | EXTENDED_RELEASE_TABLET | Freq: Two times a day (BID) | ORAL | 0 refills | Status: DC

## 2023-03-24 MED ORDER — BISACODYL 10 MG RE SUPP
10.0000 mg | Freq: Once | RECTAL | Status: AC
  Administered 2023-03-24: 10 mg via RECTAL
  Filled 2023-03-24: qty 1

## 2023-03-24 NOTE — Progress Notes (Signed)
 Assessment & Plan: Ground level fall Multiple left rib fractures, minimally displaced (#6-9) Small left pleural effusion COPD   Follow up CXR this AM stable - no PTX, small effusion on left.   Agree with plans for discharge if adequate pain control, oxygenation.  Surgery will sign off.  Call if needed.        [name], MD [facility] practice Office: [phone]        Chief Complaint: Fall, rib fractures  Subjective: Patient up in chair, some pain.  Objective: Vital signs in last 24 hours: Temp:  [97.7 F (36.5 C)-98.5 F (36.9 C)] 98.1 F (36.7 C) (03/16 0930) Pulse Rate:  [78-103] 78 (03/16 0930) Resp:  [16-20] 16 (03/16 0930) BP: (117-156)/(56-67) 123/57 (03/16 0930) SpO2:  [83 %-95 %] 95 % (03/16 0930)    Intake/Output from previous day: 03/15 0701 - 03/16 0700 In: -  Out: 50 [Urine:50] Intake/Output this shift: No intake/output data recorded.  Physical Exam: Chest - tenderness on left chest wall, no crepitance  Lab Results:  Recent Labs    03/22/23 1633 03/23/23 0809  WBC 6.7 4.4  HGB 12.5* 11.4*  HCT 36.1* 33.1*  PLT 124* 104*   BMET Recent Labs    03/22/23 1145 03/22/23 1633  NA 133*  --   K 3.6  --   CL 91*  --   CO2 30  --   GLUCOSE 100*  --   BUN 12  --   CREATININE 0.71 0.83  CALCIUM 9.0  --    PT/INR No results for input(s): "LABPROT", "INR" in the last 72 hours. Comprehensive Metabolic Panel:    Component Value Date/Time   NA 133 (L) 03/22/2023 1145   NA 136 07/09/2022 0422   NA 133 (L) 08/28/2019 1056   NA 133 (L) 08/26/2019 1116   K 3.6 03/22/2023 1145   K 4.2 07/09/2022 0422   CL 91 (L) 03/22/2023 1145   CL 96 (L) 07/09/2022 0422   CO2 30 03/22/2023 1145   CO2 32 07/09/2022 0422   BUN 12 03/22/2023 1145   BUN 24 (H) 07/09/2022 0422   BUN 11 08/28/2019 1056   BUN 6 (L) 08/26/2019 1116   CREATININE 0.83 03/22/2023 1633   CREATININE 0.71 03/22/2023 1145   GLUCOSE 100 (H)  03/22/2023 1145   GLUCOSE 111 (H) 07/09/2022 0422   CALCIUM 9.0 03/22/2023 1145   CALCIUM 8.8 (L) 07/09/2022 0422   AST 32 07/09/2022 0422   AST 31 07/08/2022 0548   ALT 45 (H) 07/09/2022 0422   ALT 36 07/08/2022 0548   ALKPHOS 89 07/09/2022 0422   ALKPHOS 97 07/08/2022 0548   BILITOT 0.8 07/09/2022 0422   BILITOT 0.7 07/08/2022 0548   BILITOT 1.0 09/11/2019 1221   PROT 6.7 07/09/2022 0422   PROT 6.9 07/08/2022 0548   PROT 7.4 09/11/2019 1221   ALBUMIN 3.3 (L) 07/09/2022 0422   ALBUMIN 3.3 (L) 07/08/2022 0548   ALBUMIN 4.5 09/11/2019 1221    Studies/Results: CT Chest Wo Contrast Result Date: 03/22/2023 CLINICAL DATA:  Blunt chest Trauma EXAM: CT CHEST WITHOUT CONTRAST TECHNIQUE: Multidetector CT imaging of the chest was performed following the standard protocol without IV contrast. RADIATION DOSE REDUCTION: This exam was performed according to the departmental dose-optimization program which includes automated exposure control, adjustment of the mA and/or kV according to patient size and/or use of iterative reconstruction technique. COMPARISON:  Radiography 03/20/2023.  Chest CT 10/04/2022. FINDINGS: Cardiovascular: Heart size upper  limits of normal. No pericardial fluid. Coronary artery calcification and aortic atherosclerotic calcification are present. Mediastinum/Nodes: No mediastinal or hilar mass or lymphadenopathy. Lungs/Pleura: Upper lobe predominant emphysema and pulmonary scarring as seen previously. No evidence of active pneumonia or developing mass lesion. Chronic scarring/volume loss in the lingula. Small amount of layering pleural fluid on the left with dependent atelectasis. No pneumothorax. Upper Abdomen: Negative Musculoskeletal: Nondisplaced fracture of the left lateral sixth rib. Minimally displaced fracture of the left lateral seventh rib. Mildly displaced and overlapped fracture of the left lateral eighth rib. Minimally displaced fracture of the left ninth rib. No sternal  fracture. No spinal fracture. IMPRESSION: 1. Fractures of the left lateral sixth through ninth ribs. Small amount of layering pleural fluid on the left with dependent atelectasis. No pneumothorax. 2. Upper lobe predominant emphysema and pulmonary scarring as seen previously. 3. Coronary artery calcification and aortic atherosclerotic calcification. Electronically Signed   By: Paulina Fusi M.D.   On: 03/22/2023 14:37      [name] 03/24/2023  Patient ID: [name] [name], male   DOB: 1945-08-06, 78 y.o.   MRN: [id number]

## 2023-03-24 NOTE — TOC Progression Note (Signed)
 Transition of Care [facility]) - Progression Note    Patient Details  Name: MCDONALD REILING MRN: [id number] Date of Birth: 12-Dec-1945  Transition of [facility]) CM/SW Contact  Harmoni Lucus, [place], [place] Phone Number: 03/24/2023, 11:25 AM  Clinical Narrative:     Met with patient at beside to answer questions regarding transportation to follow up appointments. Per patient, his daughter resides with him and may be able to assist with transportation to medical appointments. Patient also encouraged to contact his insurance company to discuss transportation benefit.  Rawn Quiroa, LCSW Transition of Care    Barriers to Discharge: Continued Medical Work up  Expected Discharge Plan and Services         Expected Discharge Date: 03/24/23                         HH Arranged: OT HH Agency: [facility] Date HH Agency Contacted: 03/23/23 Time HH Agency Contacted: 1410 Representative spoke with at [facility] Agency: Research scientist (physical sciences) (Accepted @ [phone])   Social Determinants of Health (SDOH) Interventions SDOH Screenings   Food Insecurity: Patient Declined (03/23/2023)  Housing: Patient Declined (03/23/2023)  Transportation Needs: Patient Declined (03/23/2023)  Utilities: Patient Declined (03/23/2023)  Depression (PHQ2-9): Low Risk  (03/09/2019)  Social Connections: Patient Declined (03/23/2023)  Tobacco Use: Medium Risk (03/23/2023)    Readmission Risk Interventions    07/08/2022   10:55 AM  Readmission Risk Prevention Plan  Post Dischage Appt Complete  Medication Screening Complete  Transportation Screening Complete

## 2023-03-24 NOTE — Discharge Summary (Addendum)
 Physician Discharge Summary  [name] [name] ZOX:[id number] DOB: [birth year] DOA: 03/22/2023  PCP: Chilton Greathouse, MD  Admit date: 03/22/2023 Discharge date: 03/24/2023 30 Day Unplanned Readmission Risk Score    Flowsheet Row ED to Hosp-Admission (Current) from 03/22/2023 in [facility]  30 Day Unplanned Readmission Risk Score (%) 14.06 Filed at 03/24/2023 0801       This score is the patient's risk of an unplanned readmission within 30 days of being discharged (0 -100%). The score is based on dignosis, age, lab data, medications, orders, and past utilization.   Low:  0-14.9   Medium: 15-21.9   High: 22-29.9   Extreme: 30 and above          Admitted From: Home Disposition: Home  Recommendations for Outpatient Follow-up:  Follow up with PCP in 1-2 weeks Please obtain BMP/CBC in one week Please follow up with your PCP on the following pending results: Unresulted Labs (From admission, onward)     Start     Ordered   03/29/23 0500  Creatinine, serum  (enoxaparin (LOVENOX)    CrCl >/= 30 ml/min)  Weekly,   R     Comments: while on enoxaparin therapy    03/22/23 1409   03/24/23 0730  CBC with Differential/Platelet  ONCE - URGENT,   URGENT        03/24/23 0729   03/24/23 0730  Basic metabolic panel  Once,   R        03/24/23 0729              Home Health: Yes Equipment/Devices: Walker  Discharge Condition: Stable CODE STATUS: Full code Diet recommendation: Cardiac  HPI: [name] [name] is a 77 y.o. 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  According to patient, he fell 3/10 - ground level fall that occurred when he tripped over his oxygen tubing and struck his left side against the arm of his  couch. He also tore the skin on his left hand. He denies head injury or LOC. He was evaluated in ED 2 days later on 3/12 and found to have a rib fracture. He was offered admission but declined. He was discharged with IS and oxycodone for pain control and told to follow up with PCP. Since that time he has had worsening pain and shortness of breath at home requiring increasing amounts of O2. He has baseline intermittent cough. He has constipation intermittently at baseline for which he takes OTC medications. Last BM was Tuesday. He has had difficulty urinating since Wednesday. Last urinated last night. He denies fever, chills, urinary symptoms, n/v/abdominal pain.  He is a former smoker but drinks alcohol daily-12 beers per week-2-3 drinks per day.  No history of withdrawal.   ED Course: Upon arrival to ED, he was hemodynamically stable, hypoxic requiring 4 L of oxygen to maintain oxygen saturation over 90%.  CT chest shows fractures of the left 6th through 9th ribs with small pleural effusion and dependent atelectasis.  Upper lobe emphysema and pulmonary scarring.  Coronary artery calcification.  Patient received few doses of IV pain medications.  Trauma surgery was consulted who recommended admission for observation under hospitalist service.  TRH was then consulted for admission and patient was transferred to [facility].  Subjective: Seen and examined.  Patient has no new complaint, pain is very well-controlled but patient has a lot of complaints about not being heard by the nursing staff.  He is upset that he has not gotten any pastry since he has been here.  Although he was started on Incruse Ellipta, albuterol and DuoNeb.  He has no wheezes or any shortness of breath.  He said it takes time for someone to come address his concerns when he needs the staff.  He also had complaints that he has not had any bowel movement in last 2 days.  I reminded him that I have not been informed about this and he did  not bring that up yesterday.  After this, I have ordered MiraLAX and Dulcolax suppository.  I left it up to him if he would like to wait until he has a bowel movement and then go home or go home before that.  He denies any abdominal pain.  When I entered the room, he was already talking to the PT where PT had already finished working with him and was trying to explain him why they believe that patient is medically ready now.  To that, patient became extremely upset and appeared to have severe anxiety and almost a panic attack.  This was before even I started talking to him.  He continued to believe that the whole medical staff is just here to get him out of the hospital.  Although I did explain to him yesterday that his pain was fairly controlled yesterday and he was back on his home oxygen and that he could have gone home yesterday but he was extremely anxious and apprehensive so I kept him overnight with the clear expectation that if his pain is better and oxygenation is better, he will likely be discharged today.  I spent almost 25 minutes in the room trying to counsel him and calm him down.  Eventually, he did calm down.  We discussed his pain medication regiment at discharge as below as well.  He is going to follow-up with his PCP.  Brief/Interim Summary: Details of hospitalization as below.  Mechanical ground-level fall/left sided multiple rib fractures/left-sided atelectasis with small pleural effusion: No pneumothorax.  Managed with IV Dilaudid, oral oxycodone.  Pain very much controlled.  He was reassessed by trauma surgery.  Repeat x-ray was done today which once again ruled out pneumothorax.  Trauma surgery cleared him for discharge and he is stable for discharge today.  He was reassessed by PT OT today and they recommended home health, walker.  Patient says he has walker he does not need another one.  Repeat chest x-ray again today did not show any pneumothorax.   Acute on chronic hypoxic  respiratory failure: Uses 3 to 4 L oxygen at home, required 5 L upon arrival but has not been weaned down to 3 L which is his baseline.  I encouraged him to continue to use incentive spirometry at home.    COPD: Not in exacerbation.  Continue current medications.   Hypertension: Blood pressure controlled.  Continue home medications.   Hyperlipidemia: Resume statin.   EtOH dependence: No signs of withdrawal, but he is extremely anxious.   Rheumatoid arthritis: On methotrexate at baseline.   Acute anemia: Hemoglobin 12.7, was 15.6 just 8 months ago.  No reports of melena, hematochezia or hematemesis.  Hemoglobin stable.   Urinary retention: Resolved.   Generalized anxiety disorders with panic attacks.: Resume Zoloft and Xanax.  Chronic diastolic congestive heart failure: Last echo in June 2024 shows normal ejection fraction and grade 1 diastolic dysfunction.  Will continue home dose of Lasix.  Discharge plan was discussed with patient and/or family member and they verbalized understanding and agreed with it.  Discharge Diagnoses:  Principal Problem:   Rib fracture Active Problems:   Acute on chronic respiratory failure with hypoxia (HCC)   COPD (chronic obstructive pulmonary disease) (HCC)   Essential hypertension   Generalized anxiety disorder with panic attacks   Alcohol abuse   Hyperlipidemia   Former smoker   Pleural effusion    Discharge Instructions   Allergies as of 03/24/2023       Reactions   Fluticasone-salmeterol Shortness Of Breath, Other (See Comments)   "Worsening shortness of breath.."        Medication List     STOP taking these medications    senna 8.6 MG Tabs tablet Commonly known as: SENOKOT       TAKE these medications    albuterol 108 (90 Base) MCG/ACT inhaler Commonly known as: VENTOLIN HFA Inhale 2 puffs into the lungs every 6 (six) hours as needed for wheezing or shortness of breath. What changed:  when to take this additional  instructions   ALPRAZolam 0.25 MG tablet Commonly known as: XANAX Take 0.25 mg by mouth at bedtime as needed.   amLODipine 5 MG tablet Commonly known as: NORVASC Take 5 mg by mouth daily.   atorvastatin 20 MG tablet Commonly known as: LIPITOR Take 20 mg by mouth daily.   Breztri Aerosphere 160-9-4.8 MCG/ACT Aero Generic drug: budeson-glycopyrrolate-formoterol Inhale 2 puffs into the lungs in the morning and at bedtime.   cyanocobalamin 1000 MCG tablet Commonly known as: VITAMIN B12 Take 1,000 mcg by mouth daily.   folic acid 1 MG tablet Commonly known as: FOLVITE Take 1 mg by mouth daily.   furosemide 40 MG tablet Commonly known as: LASIX Take 40 mg by mouth in the morning.   hydrOXYzine 25 MG tablet Commonly known as: ATARAX TAKE 1 TABLET BY MOUTH EVERY 8 HOURS AS NEEDED FOR ANXIETY.   methocarbamol 500 MG tablet Commonly known as: ROBAXIN Take 1 tablet (500 mg total) by mouth every 8 (eight) hours as needed for muscle spasms.   methotrexate 50 MG/2ML injection Inject 0.8 mLs into the skin once a week. sundays   multivitamin with minerals tablet Take 1 tablet by mouth daily with breakfast.   omeprazole 20 MG tablet Commonly known as: PRILOSEC OTC Take 20 mg by mouth daily.   Oxycodone HCl 10 MG Tabs Take 1 tablet (10 mg total) by mouth every 8 (eight) hours as needed for moderate pain (pain score 4-6) or severe pain (pain score 7-10) (2.5 mg for moderate, 5 mg for severe pain). What changed:  medication strength how much to take when to take this reasons to take this   oxyCODONE 10 mg 12 hr tablet Commonly known as: OXYCONTIN Take 1 tablet (10 mg total) by mouth every 12 (twelve) hours. What changed: You were already taking a medication with the same name, and this prescription was added. Make sure you understand how and when to take each.   OXYGEN Inhale 3 L/min into the lungs continuous.   polyethylene glycol 17 g packet Commonly known as: MIRALAX /  GLYCOLAX Take 17 g by mouth daily as needed for mild constipation.   Potassium Chloride ER 20 MEQ Tbcr Take 20 mEq by mouth daily.   sertraline 100 MG tablet  Commonly known as: ZOLOFT Take 100 mg by mouth daily.        Follow-up Information     [place], [facility] Follow up.   Why: Physical therapy. Office will call to arrange follow up after hospital discharge. Contact information: [address] [place] [address] [phone]         Avva, Joylene Draft, MD Follow up in 1 week(s).   Specialty: Internal Medicine Contact information: [address] [place] [address] [phone]                Allergies  Allergen Reactions   Fluticasone-Salmeterol Shortness Of Breath and Other (See Comments)    "Worsening shortness of breath.."    Consultations: Surgery   Procedures/Studies: CT Chest Wo Contrast Result Date: 03/22/2023 CLINICAL DATA:  Blunt chest Trauma EXAM: CT CHEST WITHOUT CONTRAST TECHNIQUE: Multidetector CT imaging of the chest was performed following the standard protocol without IV contrast. RADIATION DOSE REDUCTION: This exam was performed according to the departmental dose-optimization program which includes automated exposure control, adjustment of the mA and/or kV according to patient size and/or use of iterative reconstruction technique. COMPARISON:  Radiography 03/20/2023.  Chest CT 10/04/2022. FINDINGS: Cardiovascular: Heart size upper limits of normal. No pericardial fluid. Coronary artery calcification and aortic atherosclerotic calcification are present. Mediastinum/Nodes: No mediastinal or hilar mass or lymphadenopathy. Lungs/Pleura: Upper lobe predominant emphysema and pulmonary scarring as seen previously. No evidence of active pneumonia or developing mass lesion. Chronic scarring/volume loss in the lingula. Small amount of layering pleural fluid on the left with dependent atelectasis. No pneumothorax. Upper  Abdomen: Negative Musculoskeletal: Nondisplaced fracture of the left lateral sixth rib. Minimally displaced fracture of the left lateral seventh rib. Mildly displaced and overlapped fracture of the left lateral eighth rib. Minimally displaced fracture of the left ninth rib. No sternal fracture. No spinal fracture. IMPRESSION: 1. Fractures of the left lateral sixth through ninth ribs. Small amount of layering pleural fluid on the left with dependent atelectasis. No pneumothorax. 2. Upper lobe predominant emphysema and pulmonary scarring as seen previously. 3. Coronary artery calcification and aortic atherosclerotic calcification. Electronically Signed   By: Paulina Fusi M.D.   On: 03/22/2023 14:37   DG Ribs Unilateral W/Chest Left Result Date: 03/20/2023 CLINICAL DATA:  Left rib pain after fall 2 days ago. EXAM: LEFT RIBS AND CHEST - 3+ VIEW COMPARISON:  July 04, 2022. FINDINGS: Minimally displaced fracture is seen involving the left eighth rib laterally. Minimal left basilar subsegmental atelectasis is noted with small pleural effusion. Stable cardiomediastinal silhouette. IMPRESSION: Minimally displaced left eighth rib fracture. Minimal left basilar subsegmental atelectasis with small left pleural effusion. Electronically Signed   By: Lupita Raider M.D.   On: 03/20/2023 13:10     Discharge Exam: Vitals:   03/24/23 0635 03/24/23 0930  BP:  (!) 123/57  Pulse:  78  Resp:  16  Temp:  98.1 F (36.7 C)  SpO2: 92% 95%   Vitals:   03/23/23 2200 03/24/23 0507 03/24/23 0635 03/24/23 0930  BP:  (!) 117/56  (!) 123/57  Pulse:  93  78  Resp:  18  16  Temp:  98.5 F (36.9 C)  98.1 F (36.7 C)  TempSrc:    Oral  SpO2: 93% (!) 89% 92% 95%  Weight:      Height:        General: Pt is alert, awake, not in acute distress Cardiovascular: RRR, S1/S2 +, no rubs, no gallops Respiratory: CTA  bilaterally, no wheezing, no rhonchi Abdominal: Soft, NT, ND, bowel sounds + Extremities: no edema, no  cyanosis    The results of significant diagnostics from this hospitalization (including imaging, microbiology, ancillary and laboratory) are listed below for reference.     Microbiology: No results found for this or any previous visit (from the past 240 hours).   Labs: BNP (last 3 results) Recent Labs    07/04/22 1154  BNP 38.5   Basic Metabolic Panel: Recent Labs  Lab 03/22/23 1145 03/22/23 1633  NA 133*  --   K 3.6  --   CL 91*  --   CO2 30  --   GLUCOSE 100*  --   BUN 12  --   CREATININE 0.71 0.83  CALCIUM 9.0  --    Liver Function Tests: No results for input(s): "AST", "ALT", "ALKPHOS", "BILITOT", "PROT", "ALBUMIN" in the last 168 hours. No results for input(s): "LIPASE", "AMYLASE" in the last 168 hours. No results for input(s): "AMMONIA" in the last 168 hours. CBC: Recent Labs  Lab 03/22/23 1145 03/22/23 1633 03/23/23 0809  WBC 6.4 6.7 4.4  HGB 12.7* 12.5* 11.4*  HCT 36.8* 36.1* 33.1*  MCV 106.1* 105.9* 106.4*  PLT 130* 124* 104*   Cardiac Enzymes: No results for input(s): "CKTOTAL", "CKMB", "CKMBINDEX", "TROPONINI" in the last 168 hours. BNP: Invalid input(s): "POCBNP" CBG: No results for input(s): "GLUCAP" in the last 168 hours. D-Dimer No results for input(s): "DDIMER" in the last 72 hours. Hgb A1c No results for input(s): "HGBA1C" in the last 72 hours. Lipid Profile No results for input(s): "CHOL", "HDL", "LDLCALC", "TRIG", "CHOLHDL", "LDLDIRECT" in the last 72 hours. Thyroid function studies No results for input(s): "TSH", "T4TOTAL", "T3FREE", "THYROIDAB" in the last 72 hours.  Invalid input(s): "FREET3" Anemia work up No results for input(s): "VITAMINB12", "FOLATE", "FERRITIN", "TIBC", "IRON", "RETICCTPCT" in the last 72 hours. Urinalysis No results found for: "COLORURINE", "APPEARANCEUR", "LABSPEC", "PHURINE", "GLUCOSEU", "HGBUR", "BILIRUBINUR", "KETONESUR", "PROTEINUR", "UROBILINOGEN", "NITRITE", "LEUKOCYTESUR" Sepsis Labs Recent Labs   Lab 03/22/23 1145 03/22/23 1633 03/23/23 0809  WBC 6.4 6.7 4.4   Microbiology No results found for this or any previous visit (from the past 240 hours).  FURTHER DISCHARGE INSTRUCTIONS:   Get Medicines reviewed and adjusted: Please take all your medications with you for your next visit with your Primary MD   Laboratory/radiological data: Please request your Primary MD to go over all hospital tests and procedure/radiological results at the follow up, please ask your Primary MD to get all Hospital records sent to his/her office.   In some cases, they will be blood work, cultures and biopsy results pending at the time of your discharge. Please request that your primary care M.D. goes through all the records of your hospital data and follows up on these results.   Also Note the following: If you experience worsening of your admission symptoms, develop shortness of breath, life threatening emergency, suicidal or homicidal thoughts you must seek medical attention immediately by calling 911 or calling your MD immediately  if symptoms less severe.   You must read complete instructions/literature along with all the possible adverse reactions/side effects for all the Medicines you take and that have been prescribed to you. Take any new Medicines after you have completely understood and accpet all the possible adverse reactions/side effects.    Do not drive when taking Pain medications or sleeping medications (Benzodaizepines)   Do not take more than prescribed Pain, Sleep and Anxiety Medications. It is not advisable to  combine anxiety,sleep and pain medications without talking with your primary care practitioner   Special Instructions: If you have smoked or chewed Tobacco  in the last 2 yrs please stop smoking, stop any regular Alcohol  and or any Recreational drug use.   Wear Seat belts while driving.   Please note: You were cared for by a hospitalist during your hospital stay. Once you are  discharged, your primary care physician will handle any further medical issues. Please note that NO REFILLS for any discharge medications will be authorized once you are discharged, as it is imperative that you return to your primary care physician (or establish a relationship with a primary care physician if you do not have one) for your post hospital discharge needs so that they can reassess your need for medications and monitor your lab values  Time coordinating discharge: Over 30 minutes  SIGNED:   Hughie Closs, MD  [facility] 03/24/2023, 10:43 AM *Please note that this is a verbal dictation therefore any spelling or grammatical errors are due to the "Dragon [employer] One" system interpretation. If 7PM-7AM, please contact night-coverage [email or web address]

## 2023-03-24 NOTE — TOC Transition Note (Signed)
 Transition of Care [facility]) - Discharge Note   Patient Details  Name: [name] [name] MRN: [id number] Date of Birth: December 24, 1945  [facility]) CM/SW Contact:  Ronny Bacon, RN Phone Number: 03/24/2023, 10:39 AM   Clinical Narrative:   Secure message from floor nurse that patient has possible discharge today. HH orders noted, contact information placed on AVS.    Final next level of care: Home w Home Health Services Barriers to Discharge: Continued Medical Work up   Patient Goals and CMS Choice            Discharge Placement                       Discharge Plan and Services Additional resources added to the After Visit Summary for                            [facility] Arranged: OT HH Agency: [facility] Date HH Agency Contacted: 03/23/23 Time HH Agency Contacted: 1410 Representative spoke with at [facility] Agency: Research scientist (physical sciences) (Accepted @ [phone])  Social Drivers of Health (SDOH) Interventions SDOH Screenings   Food Insecurity: Patient Declined (03/23/2023)  Housing: Patient Declined (03/23/2023)  Transportation Needs: Patient Declined (03/23/2023)  Utilities: Patient Declined (03/23/2023)  Depression (PHQ2-9): Low Risk  (03/09/2019)  Social Connections: Patient Declined (03/23/2023)  Tobacco Use: Medium Risk (03/23/2023)     Readmission Risk Interventions    07/08/2022   10:55 AM  Readmission Risk Prevention Plan  Post Dischage Appt Complete  Medication Screening Complete  Transportation Screening Complete

## 2023-03-24 NOTE — Progress Notes (Signed)
 Physical Therapy Treatment Patient Details Name: [name] [name] MRN: [id number] DOB: Jan 27, 1945 Today's Date: 03/24/2023   History of Present Illness Pt is a 78 y.o. male presenting 3/14 with shortness of breath and SpO2 82%. Of note, ED visit 3/12 with L rib pain after a fall on 3/10 in which he was found to have mildly displaced 8th rib fx. Upon return admission, CT chest with L6-9 rib fractures, small pleural effusion and dependent atelctasis. 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 Pt expressed concern with being discharged home; wants to be independent with going to the bathroom and have pain be managed before being discharged; Attemtped to create space for pt to express concerns -- once Dr. Jacqulyn Bath arrived, pt became more visibly upset (see Dr. Ludwig Clarks note); Ultimately, pt is steady with RW and he has one at home; Recommend HHPT/OT follow up, Dr. Jacqulyn Bath agrees   If plan is discharge home, recommend the following: A little help with walking and/or transfers;A little help with bathing/dressing/bathroom;Assistance with cooking/housework;Assist for transportation   Can travel by private vehicle        Equipment Recommendations  None recommended by PT    Recommendations for Other Services       Precautions / Restrictions Precautions Precautions: Fall Restrictions Weight Bearing Restrictions Per Provider Order: No     Mobility  Bed Mobility                    Transfers Overall transfer level: Needs assistance Equipment used: Rolling walker (2 wheels), Straight cane Transfers: Sit to/from Stand Sit to Stand: Contact guard assist           General  transfer comment: for safety. good hand placement up from chair    Ambulation/Gait Ambulation/Gait assistance: Contact guard assist Gait Distance (Feet): 120 Feet (x 2) Assistive device: Rolling walker (2 wheels), Straight cane Gait Pattern/deviations: Step-through pattern Gait velocity: decreased     General Gait Details: required 4 rest breaks for SOB; 3 standing, 1 seated; Walked with RW and SPC, Pt reports more comfortable with SPC; RW gives better stability with ambulation, and later in teh session, pt verbalized agreement   Stairs             Wheelchair Mobility     Tilt Bed    Modified Rankin (Stroke Patients Only)       Balance Overall balance assessment: Needs assistance Sitting-balance support: No upper extremity supported, Feet supported Sitting balance-Leahy Scale: Fair     Standing balance support: No upper extremity supported, During functional activity, Reliant on assistive device for balance Standing balance-Leahy Scale: Fair Standing balance comment: statically. Benefits from UE support dynamically                            Communication Communication Communication: No apparent difficulties  Cognition Arousal: Alert Behavior During Therapy: WFL for tasks assessed/performed   PT - Cognitive impairments: Safety/Judgement                       PT - Cognition Comments: seems slightly impulsive; became slightly aggitated discussing discharge Following commands: Intact  Cueing Cueing Techniques: Verbal cues  Exercises      General Comments General comments (skin integrity, edema, etc.): SpO2 92% on 3L on arrival; increased to 4L before exertion; desat to 81% with believable pleth while ambulating on 4L; increased to 6L and SpO2 96% with a seated rest break; continued ambulation on 6L and SpO2 down to 90%; recovered to SpO2 97% with seated rest.      Pertinent Vitals/Pain Pain Assessment Pain Assessment: Faces Pain  Score: 3  Faces Pain Scale: Hurts little more Pain Location: L ribs (especially with coughing) Pain Descriptors / Indicators: Tender, Sore, Grimacing, Guarding Pain Intervention(s): Limited activity within patient's tolerance    Home Living                          Prior Function            PT Goals (current goals can now be found in the care plan section) Acute Rehab PT Goals Patient Stated Goal: I don't think I am ready to go home; will feel more ready becoming independent getting to the bathroom PT Goal Formulation: With patient Time For Goal Achievement: 03/30/23 Potential to Achieve Goals: Good Progress towards PT goals: Progressing toward goals    Frequency    Min 3X/week      PT Plan      Co-evaluation              AM-PAC PT "6 Clicks" Mobility   Outcome Measure  Help needed turning from your back to your side while in a flat bed without using bedrails?: A Little Help needed moving from lying on your back to sitting on the side of a flat bed without using bedrails?: A Little Help needed moving to and from a bed to a chair (including a wheelchair)?: A Little Help needed standing up from a chair using your arms (e.g., wheelchair or bedside chair)?: A Little Help needed to walk in hospital room?: A Little Help needed climbing 3-5 steps with a railing? : A Little 6 Click Score: 18    End of Session Equipment Utilized During Treatment: Oxygen;Gait belt Activity Tolerance: Patient tolerated treatment well Patient left: in chair;with call bell/phone within reach;with chair alarm set   PT Visit Diagnosis: Unsteadiness on feet (R26.81);Other abnormalities of gait and mobility (R26.89);History of falling (Z91.81);Muscle weakness (generalized) (M62.81)     Time: 1610-9604 PT Time Calculation (min) (ACUTE ONLY): 44 min  Charges:    $Gait Training: 8-22 mins PT General Charges $$ ACUTE PT VISIT: 1 Visit                     Van Clines, PT   Acute Rehabilitation Services Office [phone] Secure Chat welcomed    [name] [name] 03/24/2023, 3:06 PM

## 2023-03-25 DIAGNOSIS — R5382 Chronic fatigue, unspecified: Secondary | ICD-10-CM | POA: Diagnosis not present

## 2023-03-25 DIAGNOSIS — Z6830 Body mass index (BMI) 30.0-30.9, adult: Secondary | ICD-10-CM | POA: Diagnosis not present

## 2023-03-25 DIAGNOSIS — M0579 Rheumatoid arthritis with rheumatoid factor of multiple sites without organ or systems involvement: Secondary | ICD-10-CM | POA: Diagnosis not present

## 2023-03-25 DIAGNOSIS — E669 Obesity, unspecified: Secondary | ICD-10-CM | POA: Diagnosis not present

## 2023-03-25 DIAGNOSIS — M1991 Primary osteoarthritis, unspecified site: Secondary | ICD-10-CM | POA: Diagnosis not present

## 2023-03-26 DIAGNOSIS — I10 Essential (primary) hypertension: Secondary | ICD-10-CM | POA: Diagnosis not present

## 2023-03-26 DIAGNOSIS — J449 Chronic obstructive pulmonary disease, unspecified: Secondary | ICD-10-CM | POA: Diagnosis not present

## 2023-03-26 DIAGNOSIS — Z604 Social exclusion and rejection: Secondary | ICD-10-CM | POA: Diagnosis not present

## 2023-03-26 DIAGNOSIS — K5900 Constipation, unspecified: Secondary | ICD-10-CM | POA: Diagnosis not present

## 2023-03-26 DIAGNOSIS — Z7951 Long term (current) use of inhaled steroids: Secondary | ICD-10-CM | POA: Diagnosis not present

## 2023-03-26 DIAGNOSIS — J9621 Acute and chronic respiratory failure with hypoxia: Secondary | ICD-10-CM | POA: Diagnosis not present

## 2023-03-26 DIAGNOSIS — F410 Panic disorder [episodic paroxysmal anxiety] without agoraphobia: Secondary | ICD-10-CM | POA: Diagnosis not present

## 2023-03-26 DIAGNOSIS — I5033 Acute on chronic diastolic (congestive) heart failure: Secondary | ICD-10-CM | POA: Diagnosis not present

## 2023-03-26 DIAGNOSIS — S2242XD Multiple fractures of ribs, left side, subsequent encounter for fracture with routine healing: Secondary | ICD-10-CM | POA: Diagnosis not present

## 2023-03-26 DIAGNOSIS — D649 Anemia, unspecified: Secondary | ICD-10-CM | POA: Diagnosis not present

## 2023-03-26 DIAGNOSIS — E7800 Pure hypercholesterolemia, unspecified: Secondary | ICD-10-CM | POA: Diagnosis not present

## 2023-03-26 DIAGNOSIS — Z9181 History of falling: Secondary | ICD-10-CM | POA: Diagnosis not present

## 2023-03-26 DIAGNOSIS — F1010 Alcohol abuse, uncomplicated: Secondary | ICD-10-CM | POA: Diagnosis not present

## 2023-03-26 DIAGNOSIS — Z9981 Dependence on supplemental oxygen: Secondary | ICD-10-CM | POA: Diagnosis not present

## 2023-03-26 DIAGNOSIS — J90 Pleural effusion, not elsewhere classified: Secondary | ICD-10-CM | POA: Diagnosis not present

## 2023-03-26 DIAGNOSIS — Z87891 Personal history of nicotine dependence: Secondary | ICD-10-CM | POA: Diagnosis not present

## 2023-03-26 DIAGNOSIS — M069 Rheumatoid arthritis, unspecified: Secondary | ICD-10-CM | POA: Diagnosis not present

## 2023-03-26 DIAGNOSIS — F1020 Alcohol dependence, uncomplicated: Secondary | ICD-10-CM | POA: Diagnosis not present

## 2023-03-26 DIAGNOSIS — I110 Hypertensive heart disease with heart failure: Secondary | ICD-10-CM | POA: Diagnosis not present

## 2023-03-26 DIAGNOSIS — Z79891 Long term (current) use of opiate analgesic: Secondary | ICD-10-CM | POA: Diagnosis not present

## 2023-03-29 ENCOUNTER — Emergency Department (HOSPITAL_COMMUNITY)

## 2023-03-29 ENCOUNTER — Encounter (HOSPITAL_COMMUNITY): Payer: Self-pay

## 2023-03-29 ENCOUNTER — Inpatient Hospital Stay (HOSPITAL_COMMUNITY)
Admission: EM | Admit: 2023-03-29 | Discharge: 2023-04-06 | DRG: 291 | Disposition: A | Discharge status: Home Health | Attending: Family Medicine | Admitting: Family Medicine

## 2023-03-29 ENCOUNTER — Other Ambulatory Visit: Payer: Self-pay

## 2023-03-29 DIAGNOSIS — K5903 Drug induced constipation: Secondary | ICD-10-CM | POA: Diagnosis present

## 2023-03-29 DIAGNOSIS — J9621 Acute and chronic respiratory failure with hypoxia: Secondary | ICD-10-CM | POA: Diagnosis not present

## 2023-03-29 DIAGNOSIS — J439 Emphysema, unspecified: Secondary | ICD-10-CM | POA: Diagnosis not present

## 2023-03-29 DIAGNOSIS — K5900 Constipation, unspecified: Secondary | ICD-10-CM | POA: Diagnosis present

## 2023-03-29 DIAGNOSIS — I5033 Acute on chronic diastolic (congestive) heart failure: Secondary | ICD-10-CM | POA: Diagnosis not present

## 2023-03-29 DIAGNOSIS — Z7951 Long term (current) use of inhaled steroids: Secondary | ICD-10-CM

## 2023-03-29 DIAGNOSIS — Z9981 Dependence on supplemental oxygen: Secondary | ICD-10-CM

## 2023-03-29 DIAGNOSIS — J811 Chronic pulmonary edema: Secondary | ICD-10-CM | POA: Diagnosis not present

## 2023-03-29 DIAGNOSIS — I517 Cardiomegaly: Secondary | ICD-10-CM | POA: Diagnosis not present

## 2023-03-29 DIAGNOSIS — Z87891 Personal history of nicotine dependence: Secondary | ICD-10-CM | POA: Diagnosis not present

## 2023-03-29 DIAGNOSIS — F1020 Alcohol dependence, uncomplicated: Secondary | ICD-10-CM | POA: Diagnosis present

## 2023-03-29 DIAGNOSIS — S2242XA Multiple fractures of ribs, left side, initial encounter for closed fracture: Secondary | ICD-10-CM | POA: Diagnosis not present

## 2023-03-29 DIAGNOSIS — F410 Panic disorder [episodic paroxysmal anxiety] without agoraphobia: Secondary | ICD-10-CM | POA: Diagnosis not present

## 2023-03-29 DIAGNOSIS — I5032 Chronic diastolic (congestive) heart failure: Secondary | ICD-10-CM | POA: Diagnosis not present

## 2023-03-29 DIAGNOSIS — S2239XA Fracture of one rib, unspecified side, initial encounter for closed fracture: Secondary | ICD-10-CM | POA: Diagnosis present

## 2023-03-29 DIAGNOSIS — I10 Essential (primary) hypertension: Secondary | ICD-10-CM | POA: Diagnosis not present

## 2023-03-29 DIAGNOSIS — Z79899 Other long term (current) drug therapy: Secondary | ICD-10-CM | POA: Diagnosis not present

## 2023-03-29 DIAGNOSIS — S2232XA Fracture of one rib, left side, initial encounter for closed fracture: Secondary | ICD-10-CM | POA: Diagnosis not present

## 2023-03-29 DIAGNOSIS — R0602 Shortness of breath: Secondary | ICD-10-CM | POA: Diagnosis present

## 2023-03-29 DIAGNOSIS — R0689 Other abnormalities of breathing: Secondary | ICD-10-CM | POA: Diagnosis not present

## 2023-03-29 DIAGNOSIS — Z888 Allergy status to other drugs, medicaments and biological substances status: Secondary | ICD-10-CM

## 2023-03-29 DIAGNOSIS — E7800 Pure hypercholesterolemia, unspecified: Secondary | ICD-10-CM | POA: Diagnosis present

## 2023-03-29 DIAGNOSIS — W1830XA Fall on same level, unspecified, initial encounter: Secondary | ICD-10-CM | POA: Diagnosis present

## 2023-03-29 DIAGNOSIS — F411 Generalized anxiety disorder: Secondary | ICD-10-CM | POA: Diagnosis present

## 2023-03-29 DIAGNOSIS — J9611 Chronic respiratory failure with hypoxia: Secondary | ICD-10-CM | POA: Diagnosis not present

## 2023-03-29 DIAGNOSIS — J449 Chronic obstructive pulmonary disease, unspecified: Secondary | ICD-10-CM | POA: Diagnosis not present

## 2023-03-29 DIAGNOSIS — R918 Other nonspecific abnormal finding of lung field: Secondary | ICD-10-CM | POA: Diagnosis not present

## 2023-03-29 DIAGNOSIS — R000 Tachycardia, unspecified: Secondary | ICD-10-CM | POA: Diagnosis not present

## 2023-03-29 DIAGNOSIS — W19XXXA Unspecified fall, initial encounter: Secondary | ICD-10-CM | POA: Diagnosis not present

## 2023-03-29 DIAGNOSIS — Z800 Family history of malignant neoplasm of digestive organs: Secondary | ICD-10-CM | POA: Diagnosis not present

## 2023-03-29 DIAGNOSIS — T402X5A Adverse effect of other opioids, initial encounter: Secondary | ICD-10-CM | POA: Diagnosis not present

## 2023-03-29 DIAGNOSIS — I110 Hypertensive heart disease with heart failure: Principal | ICD-10-CM | POA: Diagnosis present

## 2023-03-29 DIAGNOSIS — R7301 Impaired fasting glucose: Secondary | ICD-10-CM | POA: Diagnosis not present

## 2023-03-29 DIAGNOSIS — M069 Rheumatoid arthritis, unspecified: Secondary | ICD-10-CM | POA: Diagnosis not present

## 2023-03-29 DIAGNOSIS — J90 Pleural effusion, not elsewhere classified: Secondary | ICD-10-CM | POA: Diagnosis not present

## 2023-03-29 DIAGNOSIS — R0902 Hypoxemia: Secondary | ICD-10-CM | POA: Diagnosis not present

## 2023-03-29 DIAGNOSIS — Z0389 Encounter for observation for other suspected diseases and conditions ruled out: Secondary | ICD-10-CM | POA: Diagnosis not present

## 2023-03-29 DIAGNOSIS — J441 Chronic obstructive pulmonary disease with (acute) exacerbation: Principal | ICD-10-CM | POA: Diagnosis present

## 2023-03-29 DIAGNOSIS — J984 Other disorders of lung: Secondary | ICD-10-CM | POA: Diagnosis not present

## 2023-03-29 DIAGNOSIS — R069 Unspecified abnormalities of breathing: Secondary | ICD-10-CM | POA: Diagnosis not present

## 2023-03-29 DIAGNOSIS — F1010 Alcohol abuse, uncomplicated: Secondary | ICD-10-CM | POA: Diagnosis not present

## 2023-03-29 LAB — CBC WITH DIFFERENTIAL/PLATELET
Abs Immature Granulocytes: 0.03 10*3/uL (ref 0.00–0.07)
Basophils Absolute: 0 10*3/uL (ref 0.0–0.1)
Basophils Relative: 0 %
Eosinophils Absolute: 0.1 10*3/uL (ref 0.0–0.5)
Eosinophils Relative: 1 %
HCT: 38.6 % — ABNORMAL LOW (ref 39.0–52.0)
Hemoglobin: 12.7 g/dL — ABNORMAL LOW (ref 13.0–17.0)
Immature Granulocytes: 0 %
Lymphocytes Relative: 15 %
Lymphs Abs: 1.4 10*3/uL (ref 0.7–4.0)
MCH: 36.1 pg — ABNORMAL HIGH (ref 26.0–34.0)
MCHC: 32.9 g/dL (ref 30.0–36.0)
MCV: 109.7 fL — ABNORMAL HIGH (ref 80.0–100.0)
Monocytes Absolute: 0.8 10*3/uL (ref 0.1–1.0)
Monocytes Relative: 9 %
Neutro Abs: 6.8 10*3/uL (ref 1.7–7.7)
Neutrophils Relative %: 75 %
Platelets: 183 10*3/uL (ref 150–400)
RBC: 3.52 MIL/uL — ABNORMAL LOW (ref 4.22–5.81)
RDW: 15.5 % (ref 11.5–15.5)
WBC: 9.1 10*3/uL (ref 4.0–10.5)
nRBC: 0 % (ref 0.0–0.2)

## 2023-03-29 LAB — BLOOD GAS, VENOUS
Acid-Base Excess: 6.5 mmol/L — ABNORMAL HIGH (ref 0.0–2.0)
Bicarbonate: 33.9 mmol/L — ABNORMAL HIGH (ref 20.0–28.0)
O2 Saturation: 43.7 %
Patient temperature: 37
pCO2, Ven: 60 mmHg (ref 44–60)
pH, Ven: 7.36 (ref 7.25–7.43)
pO2, Ven: <31 mmHg — CL (ref 32–45)

## 2023-03-29 LAB — BASIC METABOLIC PANEL
Anion gap: 8 (ref 5–15)
BUN: 10 mg/dL (ref 8–23)
CO2: 31 mmol/L (ref 22–32)
Calcium: 8.2 mg/dL — ABNORMAL LOW (ref 8.9–10.3)
Chloride: 94 mmol/L — ABNORMAL LOW (ref 98–111)
Creatinine, Ser: 0.54 mg/dL — ABNORMAL LOW (ref 0.61–1.24)
GFR, Estimated: >60 mL/min (ref >60)
Glucose, Bld: 117 mg/dL — ABNORMAL HIGH (ref 70–99)
Potassium: 3.8 mmol/L (ref 3.5–5.1)
Sodium: 133 mmol/L — ABNORMAL LOW (ref 135–145)

## 2023-03-29 LAB — BRAIN NATRIURETIC PEPTIDE: B Natriuretic Peptide: 136.4 pg/mL — ABNORMAL HIGH (ref 0.0–100.0)

## 2023-03-29 MED ORDER — FUROSEMIDE 40 MG PO TABS
40.0000 mg | ORAL_TABLET | Freq: Every day | ORAL | Status: DC
  Administered 2023-03-30: 40 mg via ORAL
  Filled 2023-03-29: qty 1

## 2023-03-29 MED ORDER — ACETAMINOPHEN 325 MG PO TABS
650.0000 mg | ORAL_TABLET | Freq: Four times a day (QID) | ORAL | Status: DC | PRN
  Administered 2023-04-04 – 2023-04-05 (×2): 650 mg via ORAL
  Filled 2023-03-29 (×2): qty 2

## 2023-03-29 MED ORDER — LORAZEPAM 2 MG/ML IJ SOLN: 0.0000 mg | Freq: Four times a day (QID) | INTRAMUSCULAR | Status: DC

## 2023-03-29 MED ORDER — ATORVASTATIN CALCIUM 20 MG PO TABS
20.0000 mg | ORAL_TABLET | Freq: Every day | ORAL | Status: DC
  Administered 2023-03-30 – 2023-04-06 (×8): 20 mg via ORAL
  Filled 2023-03-29 (×8): qty 1

## 2023-03-29 MED ORDER — AMLODIPINE BESYLATE 10 MG PO TABS
5.0000 mg | ORAL_TABLET | Freq: Every day | ORAL | Status: DC
  Administered 2023-03-30 – 2023-04-06 (×8): 5 mg via ORAL
  Filled 2023-03-29 (×8): qty 1

## 2023-03-29 MED ORDER — ALBUTEROL SULFATE (2.5 MG/3ML) 0.083% IN NEBU
10.0000 mg/h | INHALATION_SOLUTION | RESPIRATORY_TRACT | Status: DC
  Administered 2023-03-29: 10 mg/h via RESPIRATORY_TRACT
  Filled 2023-03-29: qty 12

## 2023-03-29 MED ORDER — SODIUM CHLORIDE 0.9 % IV SOLN
1.0000 g | INTRAVENOUS | Status: DC
  Administered 2023-03-29: 1 g via INTRAVENOUS
  Filled 2023-03-29: qty 10

## 2023-03-29 MED ORDER — IPRATROPIUM-ALBUTEROL 0.5-2.5 (3) MG/3ML IN SOLN
3.0000 mL | Freq: Four times a day (QID) | RESPIRATORY_TRACT | Status: DC
  Administered 2023-03-30 (×2): 3 mL via RESPIRATORY_TRACT
  Filled 2023-03-29 (×2): qty 3

## 2023-03-29 MED ORDER — SERTRALINE HCL 100 MG PO TABS
100.0000 mg | ORAL_TABLET | Freq: Every day | ORAL | Status: DC
  Administered 2023-03-30 – 2023-04-06 (×8): 100 mg via ORAL
  Filled 2023-03-29 (×8): qty 1

## 2023-03-29 MED ORDER — OXYCODONE HCL 5 MG PO TABS
5.0000 mg | ORAL_TABLET | Freq: Four times a day (QID) | ORAL | Status: DC | PRN
  Administered 2023-03-31 – 2023-04-02 (×4): 10 mg via ORAL
  Administered 2023-04-02: 5 mg via ORAL
  Administered 2023-04-02 – 2023-04-03 (×2): 10 mg via ORAL
  Administered 2023-04-03: 5 mg via ORAL
  Administered 2023-04-03 – 2023-04-06 (×8): 10 mg via ORAL
  Filled 2023-03-29 (×16): qty 2

## 2023-03-29 MED ORDER — THIAMINE MONONITRATE 100 MG PO TABS
100.0000 mg | ORAL_TABLET | Freq: Every day | ORAL | Status: DC
  Administered 2023-03-30 – 2023-04-06 (×7): 100 mg via ORAL
  Filled 2023-03-29 (×7): qty 1

## 2023-03-29 MED ORDER — HYDROMORPHONE HCL 1 MG/ML IJ SOLN
0.5000 mg | INTRAMUSCULAR | Status: DC | PRN
  Administered 2023-03-30 – 2023-04-01 (×5): 0.5 mg via INTRAVENOUS
  Filled 2023-03-29 (×5): qty 1

## 2023-03-29 MED ORDER — LORAZEPAM 2 MG/ML IJ SOLN: 0.0000 mg | Freq: Two times a day (BID) | INTRAMUSCULAR | Status: DC

## 2023-03-29 MED ORDER — METHOCARBAMOL 1000 MG/10ML IJ SOLN
500.0000 mg | Freq: Four times a day (QID) | INTRAMUSCULAR | Status: DC | PRN
  Administered 2023-04-05: 500 mg via INTRAVENOUS
  Filled 2023-03-29: qty 10

## 2023-03-29 MED ORDER — ONDANSETRON HCL 4 MG/2ML IJ SOLN
4.0000 mg | Freq: Four times a day (QID) | INTRAMUSCULAR | Status: DC | PRN
Start: 2023-03-29 — End: 2023-04-06

## 2023-03-29 MED ORDER — FOLIC ACID 1 MG PO TABS
1.0000 mg | ORAL_TABLET | Freq: Every day | ORAL | Status: DC
  Administered 2023-03-30 – 2023-04-06 (×8): 1 mg via ORAL
  Filled 2023-03-29 (×8): qty 1

## 2023-03-29 MED ORDER — IPRATROPIUM-ALBUTEROL 0.5-2.5 (3) MG/3ML IN SOLN: 3.0000 mL | RESPIRATORY_TRACT | Status: DC | PRN

## 2023-03-29 MED ORDER — ENOXAPARIN SODIUM 40 MG/0.4ML IJ SOSY
40.0000 mg | PREFILLED_SYRINGE | INTRAMUSCULAR | Status: DC
  Administered 2023-03-29 – 2023-04-05 (×8): 40 mg via SUBCUTANEOUS
  Filled 2023-03-29 (×8): qty 0.4

## 2023-03-29 MED ORDER — ALPRAZOLAM 0.25 MG PO TABS: 0.2500 mg | ORAL_TABLET | Freq: Every evening | ORAL | Status: DC | PRN

## 2023-03-29 MED ORDER — HYDROXYZINE HCL 25 MG PO TABS
25.0000 mg | ORAL_TABLET | Freq: Three times a day (TID) | ORAL | Status: DC | PRN
  Administered 2023-04-04: 25 mg via ORAL
  Filled 2023-03-29: qty 1

## 2023-03-29 MED ORDER — BISACODYL 5 MG PO TBEC: 5.0000 mg | DELAYED_RELEASE_TABLET | Freq: Every day | ORAL | Status: DC | PRN

## 2023-03-29 MED ORDER — THIAMINE HCL 100 MG/ML IJ SOLN
100.0000 mg | Freq: Every day | INTRAMUSCULAR | Status: DC
  Administered 2023-03-31: 100 mg via INTRAVENOUS
  Filled 2023-03-29: qty 2

## 2023-03-29 MED ORDER — POLYETHYLENE GLYCOL 3350 17 G PO PACK: 17.0000 g | PACK | Freq: Every day | ORAL | Status: DC | PRN

## 2023-03-29 MED ORDER — PREDNISONE 20 MG PO TABS
40.0000 mg | ORAL_TABLET | Freq: Every day | ORAL | Status: DC
  Administered 2023-03-31 – 2023-04-01 (×2): 40 mg via ORAL
  Filled 2023-03-29 (×2): qty 2

## 2023-03-29 MED ORDER — METHYLPREDNISOLONE SODIUM SUCC 125 MG IJ SOLR
125.0000 mg | Freq: Two times a day (BID) | INTRAMUSCULAR | Status: AC
  Administered 2023-03-30 (×2): 125 mg via INTRAVENOUS
  Filled 2023-03-29 (×2): qty 2

## 2023-03-29 MED ORDER — SODIUM CHLORIDE 0.9% FLUSH
3.0000 mL | Freq: Two times a day (BID) | INTRAVENOUS | Status: DC
  Administered 2023-03-29 – 2023-04-06 (×16): 3 mL via INTRAVENOUS

## 2023-03-29 MED ORDER — LORAZEPAM 1 MG PO TABS
1.0000 mg | ORAL_TABLET | ORAL | Status: AC | PRN
  Administered 2023-03-31: 1 mg via ORAL
  Filled 2023-03-29: qty 1

## 2023-03-29 MED ORDER — LORAZEPAM 2 MG/ML IJ SOLN
1.0000 mg | INTRAMUSCULAR | Status: AC | PRN
  Administered 2023-03-30: 1 mg via INTRAVENOUS
  Filled 2023-03-29: qty 1

## 2023-03-29 MED ORDER — ONDANSETRON HCL 4 MG PO TABS
4.0000 mg | ORAL_TABLET | Freq: Four times a day (QID) | ORAL | Status: DC | PRN
Start: 2023-03-29 — End: 2023-04-06

## 2023-03-29 MED ORDER — ADULT MULTIVITAMIN W/MINERALS CH
1.0000 | ORAL_TABLET | Freq: Every day | ORAL | Status: DC
  Administered 2023-03-30 – 2023-04-06 (×8): 1 via ORAL
  Filled 2023-03-29 (×8): qty 1

## 2023-03-29 MED ORDER — ACETAMINOPHEN 650 MG RE SUPP: 650.0000 mg | Freq: Four times a day (QID) | RECTAL | Status: DC | PRN

## 2023-03-29 NOTE — Progress Notes (Signed)
   03/29/23 1921  BiPAP/CPAP/SIPAP  BiPAP/CPAP/SIPAP Pt Type Adult  BiPAP/CPAP/SIPAP V60  Mask Type Full face mask  Dentures removed? Not applicable  Mask Size Large  Set Rate 10 breaths/min  Respiratory Rate 26 breaths/min  IPAP 12 cmH20  EPAP 5 cmH2O  FiO2 (%) 40 %  Minute Ventilation 20  Leak 10  Peak Inspiratory Pressure (PIP) 15  Tidal Volume (Vt) 636  Patient Home Equipment No  Auto Titrate No  Press High Alarm 35 cmH2O  Press Low Alarm 5 cmH2O  CPAP/SIPAP surface wiped down Yes  BiPAP/CPAP /SiPAP Vitals  Pulse Rate (!) 106  Resp (!) 27  BP (!) 125/59  SpO2 98 %  Bilateral Breath Sounds Diminished  MEWS Score/Color  MEWS Score 3  MEWS Score Color Yellow

## 2023-03-29 NOTE — ED Provider Notes (Signed)
 [facility] AT [facility] Provider Note   CSN: [id number] Arrival date & time: 03/29/23  1733     History  Chief Complaint  Patient presents with   Respiratory Distress    [name] [name] is a 78 y.o. male with a history of COPD on 3 L nasal cannula presenting from home with concern for respiratory distress.  EMS reports patient O2 sat was as low as 75% on his home oxygen at home.  Patient reports he fell a week ago was diagnosed with a likely left rib fracture.  He says his breathing not worse in the past 24 hours.  EMS give the patient 1 DuoNeb, 2 g of magnesium, and 125 mg Solu-Medrol prior to arrival.  Patient arrives on a nonrebreather.  HPI     Home Medications Prior to Admission medications   Medication Sig Start Date End Date Taking? Authorizing Provider  albuterol (VENTOLIN HFA) 108 (90 Base) MCG/ACT inhaler Inhale 2 puffs into the lungs every 6 (six) hours as needed for wheezing or shortness of breath. Patient taking differently: Inhale 2 puffs into the lungs See admin instructions. Inhale 2 puffs into the lungs every 4-6 hours as needed for shortness of breath or wheezing 12/08/21   Oretha Milch, MD  ALPRAZolam Prudy Feeler) 0.25 MG tablet Take 0.25 mg by mouth at bedtime as needed.    [provider]  amLODipine (NORVASC) 5 MG tablet Take 5 mg by mouth daily. 11/23/22   [provider]  atorvastatin (LIPITOR) 20 MG tablet Take 20 mg by mouth daily. 07/01/22   [provider]  Budeson-Glycopyrrol-Formoterol (BREZTRI AEROSPHERE) 160-9-4.8 MCG/ACT AERO Inhale 2 puffs into the lungs in the morning and at bedtime. 02/11/23   Glenford Bayley, NP  folic acid (FOLVITE) 1 MG tablet Take 1 mg by mouth daily. 11/01/22   [provider]  furosemide (LASIX) 40 MG tablet Take 40 mg by mouth in the morning. 03/06/22   [provider]  hydrOXYzine (ATARAX) 25 MG tablet TAKE 1 TABLET BY MOUTH EVERY 8 HOURS AS  NEEDED FOR ANXIETY. 11/23/22   Glenford Bayley, NP  methocarbamol (ROBAXIN) 500 MG tablet Take 1 tablet (500 mg total) by mouth every 8 (eight) hours as needed for muscle spasms. 03/24/23   Hughie Closs, MD  methotrexate 50 MG/2ML injection Inject 0.8 mLs into the skin once a week. sundays    [provider]  Multiple Vitamins-Minerals (MULTIVITAMIN WITH MINERALS) tablet Take 1 tablet by mouth daily with breakfast.    [provider]  omeprazole (PRILOSEC OTC) 20 MG tablet Take 20 mg by mouth daily.    [provider]  oxyCODONE (OXYCONTIN) 10 mg 12 hr tablet Take 1 tablet (10 mg total) by mouth every 12 (twelve) hours. 03/24/23   Hughie Closs, MD  oxyCODONE 10 MG TABS Take 1 tablet (10 mg total) by mouth every 8 (eight) hours as needed for moderate pain (pain score 4-6) or severe pain (pain score 7-10) (2.5 mg for moderate, 5 mg for severe pain). 03/24/23   Hughie Closs, MD  OXYGEN Inhale 3 L/min into the lungs continuous.    [provider]  polyethylene glycol (MIRALAX / GLYCOLAX) 17 g packet Take 17 g by mouth daily as needed for mild constipation. 07/09/22   Shalhoub, Deno Lunger, MD  Potassium Chloride ER 20 MEQ TBCR Take 20 mEq by mouth daily. 02/02/22   [provider]  sertraline (ZOLOFT) 100 MG tablet Take 100  mg by mouth daily. 02/22/23   [provider]  vitamin B-12 (CYANOCOBALAMIN) 1000 MCG tablet Take 1,000 mcg by mouth daily.    [provider]      Allergies    Fluticasone-salmeterol    Review of Systems   Review of Systems  Physical Exam Updated Vital Signs BP (!) 125/59   Pulse (!) 106   Temp 98.7 F (37.1 C) (Oral)   Resp (!) 27   Ht 5\' 11"  (1.803 m)   Wt 94.9 kg   SpO2 98%   BMI 29.18 kg/m  Physical Exam Constitutional:      General: He is not in acute distress. HENT:     Head: Normocephalic and atraumatic.  Eyes:     Conjunctiva/sclera: Conjunctivae normal.     Pupils: Pupils are equal, round, and  reactive to light.  Cardiovascular:     Rate and Rhythm: Regular rhythm. Tachycardia present.  Pulmonary:     Comments: Distant and diminished breath sounds bilaterally, faint expiratory wheezing audible bilaterally; patient is tachypneic and speaking in short sentences Abdominal:     General: There is no distension.     Tenderness: There is no abdominal tenderness.  Skin:    General: Skin is warm and dry.  Neurological:     General: No focal deficit present.     Mental Status: He is alert. Mental status is at baseline.  Psychiatric:        Mood and Affect: Mood normal.        Behavior: Behavior normal.     ED Results / Procedures / Treatments   Labs (all labs ordered are listed, but only abnormal results are displayed) Labs Reviewed  BASIC METABOLIC PANEL - Abnormal; Notable for the following components:      Result Value   Sodium 133 (*)    Chloride 94 (*)    Glucose, Bld 117 (*)    Creatinine, Ser 0.54 (*)    Calcium 8.2 (*)    All other components within normal limits  CBC WITH DIFFERENTIAL/PLATELET - Abnormal; Notable for the following components:   RBC 3.52 (*)    Hemoglobin 12.7 (*)    HCT 38.6 (*)    MCV 109.7 (*)    MCH 36.1 (*)    All other components within normal limits  BRAIN NATRIURETIC PEPTIDE - Abnormal; Notable for the following components:   B Natriuretic Peptide 136.4 (*)    All other components within normal limits  BLOOD GAS, VENOUS - Abnormal; Notable for the following components:   pO2, Ven <31 (*)    Bicarbonate 33.9 (*)    Acid-Base Excess 6.5 (*)    All other components within normal limits  EXPECTORATED SPUTUM ASSESSMENT W GRAM STAIN, RFLX TO RESP C  BASIC METABOLIC PANEL  CBC    EKG EKG Interpretation Date/Time:  Friday March 29 2023 17:49:56 EDT Ventricular Rate:  114 PR Interval:  158 QRS Duration:  101 QT Interval:  320 QTC Calculation: 441 R Axis:   80  Text Interpretation: Sinus tachycardia Probable left atrial enlargement  Nonspecific repol abnormality, diffuse leads Confirmed by Alvester Chou [phone]) on 03/29/2023 6:12:29 PM  Radiology DG Chest Portable 1 View Result Date: 03/29/2023 CLINICAL DATA:  Concern for left apical pneumothorax. Respiratory distress. EXAM: PORTABLE CHEST 1 VIEW COMPARISON:  03/24/2023, CT 03/22/2023 FINDINGS: The patient is significantly rotated. The patient's chin obscures the left lung apex. Allowing for this, no evidence of pneumothorax. Persistent increased density at the left  lung base, likely effusion. Chronic bronchial thickening. Heart size not well assessed on provided view. Known left rib fractures are not well demonstrated. IMPRESSION: 1. No evidence of pneumothorax. Please note current exam is limited, significantly rotated and the patient's chin obscures the left lung apex. 2. Persistent increased density at the left lung base, likely effusion. Known left rib fractures are not well seen on the current exam. Electronically Signed   By: Narda Rutherford M.D.   On: 03/29/2023 19:23    Procedures .Critical Care  Performed by: Terald Sleeper, MD Authorized by: Terald Sleeper, MD   Critical care provider statement:    Critical care time (minutes):  40   Critical care time was exclusive of:  Separately billable procedures and treating other patients   Critical care was necessary to treat or prevent imminent or life-threatening deterioration of the following conditions:  Respiratory failure   Critical care was time spent personally by me on the following activities:  Ordering and performing treatments and interventions, ordering and review of laboratory studies, ordering and review of radiographic studies, pulse oximetry, review of old charts, examination of patient and evaluation of patient's response to treatment   Care discussed with: admitting provider   Comments:     COPD exacerbation requiring BiPAP, hypoxia     Medications Ordered in ED Medications  albuterol  (PROVENTIL) (2.5 MG/3ML) 0.083% nebulizer solution (10 mg/hr Nebulization New Bag/Given 03/29/23 1821)  amLODipine (NORVASC) tablet 5 mg (has no administration in time range)  atorvastatin (LIPITOR) tablet 20 mg (has no administration in time range)  furosemide (LASIX) tablet 40 mg (has no administration in time range)  ALPRAZolam (XANAX) tablet 0.25 mg (has no administration in time range)  hydrOXYzine (ATARAX) tablet 25 mg (has no administration in time range)  sertraline (ZOLOFT) tablet 100 mg (has no administration in time range)  cefTRIAXone (ROCEPHIN) 1 g in sodium chloride 0.9 % 100 mL IVPB (1 g Intravenous New Bag/Given 03/29/23 2208)  methylPREDNISolone sodium succinate (SOLU-MEDROL) 125 mg/2 mL injection 125 mg (has no administration in time range)    Followed by  predniSONE (DELTASONE) tablet 40 mg (has no administration in time range)  ipratropium-albuterol (DUONEB) 0.5-2.5 (3) MG/3ML nebulizer solution 3 mL (has no administration in time range)  ipratropium-albuterol (DUONEB) 0.5-2.5 (3) MG/3ML nebulizer solution 3 mL (has no administration in time range)  enoxaparin (LOVENOX) injection 40 mg (40 mg Subcutaneous Given 03/29/23 2206)  sodium chloride flush (NS) 0.9 % injection 3 mL (3 mLs Intravenous Given 03/29/23 2206)  acetaminophen (TYLENOL) tablet 650 mg (has no administration in time range)    Or  acetaminophen (TYLENOL) suppository 650 mg (has no administration in time range)  oxyCODONE (Oxy IR/ROXICODONE) immediate release tablet 5-10 mg (has no administration in time range)  HYDROmorphone (DILAUDID) injection 0.5 mg (has no administration in time range)  polyethylene glycol (MIRALAX / GLYCOLAX) packet 17 g (has no administration in time range)  bisacodyl (DULCOLAX) EC tablet 5 mg (has no administration in time range)  methocarbamol (ROBAXIN) injection 500 mg (has no administration in time range)  ondansetron (ZOFRAN) tablet 4 mg (has no administration in time range)    Or   ondansetron (ZOFRAN) injection 4 mg (has no administration in time range)  LORazepam (ATIVAN) tablet 1-4 mg (has no administration in time range)    Or  LORazepam (ATIVAN) injection 1-4 mg (has no administration in time range)  thiamine (VITAMIN B1) tablet 100 mg (has no administration in time range)  Or  thiamine (VITAMIN B1) injection 100 mg (has no administration in time range)  folic acid (FOLVITE) tablet 1 mg (has no administration in time range)  multivitamin with minerals tablet 1 tablet (has no administration in time range)  LORazepam (ATIVAN) injection 0-4 mg (has no administration in time range)    Followed by  LORazepam (ATIVAN) injection 0-4 mg (has no administration in time range)    ED Course/ Medical Decision Making/ A&P Clinical Course as of 03/29/23 2240  Fri Mar 29, 2023  1802 RT called to initiate bipap - no large PTX visible on xray per my bedside read. [MT]  1812 pH, Ven: 7.36 [MT]  1940 Reassessed with significant improvement of his work of breathing, much more comfortable now.  Plan to admit for COPD exacerbation. [MT]    Clinical Course User Index [MT] Laportia Carley, Kermit Balo, MD                                 Medical Decision Making Amount and/or Complexity of Data Reviewed Labs: ordered. Decision-making details documented in ED Course. Radiology: ordered. ECG/medicine tests: ordered.  Risk Prescription drug management. Decision regarding hospitalization.   This patient presents to the ED with concern for acute shortness of breath. This involves an extensive number of treatment options, and is a complaint that carries with it a high risk of complications and morbidity.  The differential diagnosis includes COPD versus congestive heart failure versus pneumonia versus pleural effusion versus pneumothorax versus other  Co-morbidities that complicate the patient evaluation: History of known COPD and CHF at risk of exacerbation  Additional history obtained  from EMS  External records from outside source obtained and reviewed including CT scan on March 14, approxi-1 week ago, showing fractures of left lateral 6 through ninth ribs with small amount of layering pleural fluid on the left and dependent atelectasis  I ordered and personally interpreted labs.  The pertinent results include: No significant change in prior lab values, venous gas without hypercapnia or acidosis.  I ordered imaging studies including x-ray of the chest I independently visualized and interpreted imaging which showed cardiomegaly with pulmonary edema and atelectasis pattern, persistent rib fracture, no evident large pneumothorax I agree with the radiologist interpretation  The patient was maintained on a cardiac monitor.  I personally viewed and interpreted the cardiac monitored which showed an underlying rhythm of: Sinus tachycardia  Per my interpretation the patient's ECG shows tachycardia with no acute ischemic findings  I ordered medication including continuous nebulizer.  Patient received IV steroids, magnesium and DuoNebs by EMS prior to arrival I have reviewed the patients home medicines and have made adjustments as needed  Test Considered: Lower suspicion for acute PE in this clinical setting  After the interventions noted above, I reevaluated the patient and found that they have: improved   Dispostion:  After consideration of the diagnostic results and the patients response to treatment, I feel that the patent would benefit from medical admission for COPD exacerbation.         Final Clinical Impression(s) / ED Diagnoses Final diagnoses:  COPD exacerbation [facility])    Rx / DC Orders ED Discharge Orders     None         Cherrise Occhipinti, Kermit Balo, MD 03/29/23 2240

## 2023-03-29 NOTE — H&P (Signed)
 History and Physical    Efton Thomley Stangelo ZOX:[id number] DOB: Dec 21, 1945 DOA: 03/29/2023  PCP: [name], MD   Patient coming from: Home   Chief Complaint: SOB  HPI: AYYAN SITES is a 78 y.o. male with medical history significant for COPD, chronic hypoxic respiratory failure, hypertension, hyperlipidemia, anxiety, chronic HFpEF, alcohol dependence, and fall on 03/18/23 with left rib fractures who now presents with increased SOB.   Patient has continued to experience pain with deep breath, cough, or movement but feels it is well-managed with oxycodone. He reports feeling fairly well on 03/27/23 but then had increased SOB and increased cough last night that persisted throughout the day today. He began having difficulty breathing even at rest and EMS was called.    He was saturating in the 70s on his usual 3 Lpm of supplemental O2 with EMS, was placed on NRB, and treated with IV Solu-Medrol, IV magnesium, Atrovent, and albuterol prior to arrival in the ED.   ED Course: Upon arrival to the ED, patient is found to be afebrile and saturating 70s on NRB with tachypnea, tachycardia, and stable BP. Labs are most notable for normal creatinine and normal WBC. CXR demonstrates persistent opacity at left base, likely pleural effusion.   He was placed on BiPAP in ED and given a continuous albuterol treatment.   Review of Systems:  All other systems reviewed and apart from HPI, are negative.  Past Medical History:  Diagnosis Date   COPD (chronic obstructive pulmonary disease) (HCC)    High cholesterol    Hypertension     Past Surgical History:  Procedure Laterality Date   NASAL SINUS SURGERY  2007    Social History:   reports that he quit smoking about 15 years ago. His smoking use included cigarettes. He started smoking about 55 years ago. He has a 80 pack-year smoking history. He has never been exposed to tobacco smoke. He has never used smokeless tobacco. He reports current  alcohol use of about 12.0 standard drinks of alcohol per week. He reports that he does not use drugs.  Allergies  Allergen Reactions   Fluticasone-Salmeterol Shortness Of Breath and Other (See Comments)    "Worsening shortness of breath.."    Family History  Problem Relation Age of Onset   Esophageal cancer Father      Prior to Admission medications   Medication Sig Start Date End Date Taking? Authorizing Provider  albuterol (VENTOLIN HFA) 108 (90 Base) MCG/ACT inhaler Inhale 2 puffs into the lungs every 6 (six) hours as needed for wheezing or shortness of breath. Patient taking differently: Inhale 2 puffs into the lungs See admin instructions. Inhale 2 puffs into the lungs every 4-6 hours as needed for shortness of breath or wheezing 12/08/21   Oretha Milch, MD  ALPRAZolam Prudy Feeler) 0.25 MG tablet Take 0.25 mg by mouth at bedtime as needed.    [provider]  amLODipine (NORVASC) 5 MG tablet Take 5 mg by mouth daily. 11/23/22   [provider]  atorvastatin (LIPITOR) 20 MG tablet Take 20 mg by mouth daily. 07/01/22   [provider]  Budeson-Glycopyrrol-Formoterol (BREZTRI AEROSPHERE) 160-9-4.8 MCG/ACT AERO Inhale 2 puffs into the lungs in the morning and at bedtime. 02/11/23   Glenford Bayley, NP  folic acid (FOLVITE) 1 MG tablet Take 1 mg by mouth daily. 11/01/22   [provider]  furosemide (LASIX) 40 MG tablet Take 40 mg by mouth in the morning. 03/06/22   [provider]  hydrOXYzine (ATARAX) 25 MG tablet TAKE 1 TABLET BY MOUTH EVERY 8 HOURS AS NEEDED FOR ANXIETY. 11/23/22   Glenford Bayley, NP  methocarbamol (ROBAXIN) 500 MG tablet Take 1 tablet (500 mg total) by mouth every 8 (eight) hours as needed for muscle spasms. 03/24/23   Hughie Closs, MD  methotrexate 50 MG/2ML injection Inject 0.8 mLs into the skin once a week. sundays    [provider]  Multiple Vitamins-Minerals (MULTIVITAMIN WITH MINERALS) tablet Take 1 tablet by  mouth daily with breakfast.    [provider]  omeprazole (PRILOSEC OTC) 20 MG tablet Take 20 mg by mouth daily.    [provider]  oxyCODONE (OXYCONTIN) 10 mg 12 hr tablet Take 1 tablet (10 mg total) by mouth every 12 (twelve) hours. 03/24/23   Hughie Closs, MD  oxyCODONE 10 MG TABS Take 1 tablet (10 mg total) by mouth every 8 (eight) hours as needed for moderate pain (pain score 4-6) or severe pain (pain score 7-10) (2.5 mg for moderate, 5 mg for severe pain). 03/24/23   Hughie Closs, MD  OXYGEN Inhale 3 L/min into the lungs continuous.    [provider]  polyethylene glycol (MIRALAX / GLYCOLAX) 17 g packet Take 17 g by mouth daily as needed for mild constipation. 07/09/22   Shalhoub, Deno Lunger, MD  Potassium Chloride ER 20 MEQ TBCR Take 20 mEq by mouth daily. 02/02/22   [provider]  sertraline (ZOLOFT) 100 MG tablet Take 100 mg by mouth daily. 02/22/23   [provider]  vitamin B-12 (CYANOCOBALAMIN) 1000 MCG tablet Take 1,000 mcg by mouth daily.    [provider]    Physical Exam: Vitals:   03/29/23 1808 03/29/23 1821 03/29/23 1921 03/29/23 1944  BP:   (!) 125/59   Pulse: (!) 114  (!) 106   Resp: (!) 30  (!) 27   Temp:    98.7 F (37.1 C)  TempSrc:    Oral  SpO2: 100% 100% 98%   Weight:      Height:         Constitutional: NAD, no pallor or diaphoresis   Eyes: PERTLA, lids and conjunctivae normal ENMT: Mucous membranes are moist. Posterior pharynx clear of any exudate or lesions.   Neck: supple, no masses  Respiratory: Diminished breath sounds bilaterally with prolonged expiratory phase. Respirations are labored. .  Cardiovascular: S1 & S2 heard, regular rate and rhythm. Trace pretibial edema bilaterally.  Abdomen: No distension, no tenderness, soft. Bowel sounds active.  Musculoskeletal: no clubbing / cyanosis. No joint deformity upper and lower extremities.   Skin: no significant rashes, lesions, ulcers. Warm, dry,  well-perfused. Neurologic: CN 2-12 grossly intact. Moving all extremities. Alert and fully oriented.  Psychiatric: Calm. Cooperative.    Labs and Imaging on Admission: I have personally reviewed following labs and imaging studies  CBC: Recent Labs  Lab 03/23/23 0809 03/24/23 1048 03/29/23 1742  WBC 4.4 5.7 9.1  NEUTROABS  --  3.4 6.8  HGB 11.4* 12.7* 12.7*  HCT 33.1* 37.3* 38.6*  MCV 106.4* 108.7* 109.7*  PLT 104* 130* 183   Basic Metabolic Panel: Recent Labs  Lab 03/24/23 1048 03/29/23 1742  NA 134* 133*  K 3.7 3.8  CL 93* 94*  CO2 30 31  GLUCOSE 96 117*  BUN 7* 10  CREATININE 0.75 0.54*  CALCIUM 9.1 8.2*   GFR: Estimated Creatinine Clearance: 93.8 mL/min (A) (by C-G formula based on SCr of 0.54 mg/dL (L)). Liver Function  Tests: No results for input(s): "AST", "ALT", "ALKPHOS", "BILITOT", "PROT", "ALBUMIN" in the last 168 hours. No results for input(s): "LIPASE", "AMYLASE" in the last 168 hours. No results for input(s): "AMMONIA" in the last 168 hours. Coagulation Profile: No results for input(s): "INR", "PROTIME" in the last 168 hours. Cardiac Enzymes: No results for input(s): "CKTOTAL", "CKMB", "CKMBINDEX", "TROPONINI" in the last 168 hours. BNP (last 3 results) No results for input(s): "PROBNP" in the last 8760 hours. HbA1C: No results for input(s): "HGBA1C" in the last 72 hours. CBG: No results for input(s): "GLUCAP" in the last 168 hours. Lipid Profile: No results for input(s): "CHOL", "HDL", "LDLCALC", "TRIG", "CHOLHDL", "LDLDIRECT" in the last 72 hours. Thyroid Function Tests: No results for input(s): "TSH", "T4TOTAL", "FREET4", "T3FREE", "THYROIDAB" in the last 72 hours. Anemia Panel: No results for input(s): "VITAMINB12", "FOLATE", "FERRITIN", "TIBC", "IRON", "RETICCTPCT" in the last 72 hours. Urine analysis: No results found for: "COLORURINE", "APPEARANCEUR", "LABSPEC", "PHURINE", "GLUCOSEU", "HGBUR", "BILIRUBINUR", "KETONESUR", "PROTEINUR",  "UROBILINOGEN", "NITRITE", "LEUKOCYTESUR" Sepsis Labs: @LABRCNTIP (procalcitonin:4,lacticidven:4) )No results found for this or any previous visit (from the past 240 hours).   Radiological Exams on Admission: DG Chest Portable 1 View Result Date: 03/29/2023 CLINICAL DATA:  Concern for left apical pneumothorax. Respiratory distress. EXAM: PORTABLE CHEST 1 VIEW COMPARISON:  03/24/2023, CT 03/22/2023 FINDINGS: The patient is significantly rotated. The patient's chin obscures the left lung apex. Allowing for this, no evidence of pneumothorax. Persistent increased density at the left lung base, likely effusion. Chronic bronchial thickening. Heart size not well assessed on provided view. Known left rib fractures are not well demonstrated. IMPRESSION: 1. No evidence of pneumothorax. Please note current exam is limited, significantly rotated and the patient's chin obscures the left lung apex. 2. Persistent increased density at the left lung base, likely effusion. Known left rib fractures are not well seen on the current exam. Electronically Signed   By: Narda Rutherford M.D.   On: 03/29/2023 19:23    EKG: Independently reviewed. Sinus tachycardia, rate 114.   Assessment/Plan   1. COPD exacerbation; acute on chronic hypoxic respiratory failure  - Culture sputum, start antibiotic, continue systemic steroids, continue short-acting bronchodilators, continue BiPAP for now   2. Rib fractures  - Continues to have pain with deep breath, cough, and movement; no new complication on CXR  - Continue pain-control, pulmonary toilet   3. Chronic HFpEF  - Appears compensated    - Continue Lasix   4. Hypertension  - Norvasc    5. Hyperlipidemia  - Lipitor    6. EtOH dependence  - Monitor with CIWA scoring, use Ativan if needed, supplement vitamins     7. Anxiety  - Continue Zoloft, low-dose Xanax, and as-needed hydroxyzine    8. RA  - Managed with methotrexate     DVT prophylaxis: Lovenox  Code  Status: Full  Level of Care: Level of care: Stepdown Family Communication: Ex-wife and daughter at bedside   Disposition Plan:  Patient is from: home  Anticipated d/c is to: TBD Anticipated d/c date is: 04/01/23  Patient currently: Pending improved/stable respiratory status  Consults called: None  Admission status: Inpatient     Briscoe Deutscher, MD [facility]  03/29/2023, 8:11 PM

## 2023-03-29 NOTE — Progress Notes (Signed)
   03/29/23 2348  BiPAP/CPAP/SIPAP  BiPAP/CPAP/SIPAP Pt Type Adult  BiPAP/CPAP/SIPAP V60  Mask Type Full face mask  Mask Size Large  Set Rate 10 breaths/min  Respiratory Rate 26 breaths/min  IPAP 12 cmH20  EPAP 5 cmH2O  FiO2 (%) 40 %  Minute Ventilation 20  Leak 12  Peak Inspiratory Pressure (PIP) 15  Tidal Volume (Vt) 569  Patient Home Equipment No  Auto Titrate No  Press High Alarm 35 cmH2O  Press Low Alarm 5 cmH2O  BiPAP/CPAP /SiPAP Vitals  Pulse Rate 83  Resp (!) 25  BP 110/66  SpO2 95 %  Bilateral Breath Sounds Diminished  MEWS Score/Color  MEWS Score 1  MEWS Score Color [place] Si

## 2023-03-29 NOTE — ED Triage Notes (Signed)
 Pt BIBA from home, c/o respiratory distress.  At home is on 4L  O2 75. Larey Seat a week ago and seen at [facility], Rib left fracture. Hx of COPD and CHF. Meds given 1mg  of atrivant, 10 mg of albuterol, 2g of MAG sulf, 125mg  solumedrol.     O2 95 Non rebreather

## 2023-03-30 DIAGNOSIS — F411 Generalized anxiety disorder: Secondary | ICD-10-CM | POA: Diagnosis not present

## 2023-03-30 DIAGNOSIS — J441 Chronic obstructive pulmonary disease with (acute) exacerbation: Secondary | ICD-10-CM | POA: Diagnosis not present

## 2023-03-30 DIAGNOSIS — I10 Essential (primary) hypertension: Secondary | ICD-10-CM | POA: Diagnosis not present

## 2023-03-30 DIAGNOSIS — J9621 Acute and chronic respiratory failure with hypoxia: Secondary | ICD-10-CM | POA: Diagnosis not present

## 2023-03-30 LAB — BASIC METABOLIC PANEL
Anion gap: 11 (ref 5–15)
BUN: 12 mg/dL (ref 8–23)
CO2: 30 mmol/L (ref 22–32)
Calcium: 8.6 mg/dL — ABNORMAL LOW (ref 8.9–10.3)
Chloride: 92 mmol/L — ABNORMAL LOW (ref 98–111)
Creatinine, Ser: 0.6 mg/dL — ABNORMAL LOW (ref 0.61–1.24)
GFR, Estimated: >60 mL/min (ref >60)
Glucose, Bld: 190 mg/dL — ABNORMAL HIGH (ref 70–99)
Potassium: 3.5 mmol/L (ref 3.5–5.1)
Sodium: 133 mmol/L — ABNORMAL LOW (ref 135–145)

## 2023-03-30 LAB — MRSA NEXT GEN BY PCR, NASAL: MRSA by PCR Next Gen: NOT DETECTED

## 2023-03-30 LAB — CBC
HCT: 34.7 % — ABNORMAL LOW (ref 39.0–52.0)
Hemoglobin: 11.4 g/dL — ABNORMAL LOW (ref 13.0–17.0)
MCH: 36 pg — ABNORMAL HIGH (ref 26.0–34.0)
MCHC: 32.9 g/dL (ref 30.0–36.0)
MCV: 109.5 fL — ABNORMAL HIGH (ref 80.0–100.0)
Platelets: 159 10*3/uL (ref 150–400)
RBC: 3.17 MIL/uL — ABNORMAL LOW (ref 4.22–5.81)
RDW: 15.5 % (ref 11.5–15.5)
WBC: 7.7 10*3/uL (ref 4.0–10.5)
nRBC: 0 % (ref 0.0–0.2)

## 2023-03-30 LAB — PROCALCITONIN: Procalcitonin: <0.1 ng/mL

## 2023-03-30 MED ORDER — ORAL CARE MOUTH RINSE
15.0000 mL | OROMUCOSAL | Status: DC
  Administered 2023-03-30 – 2023-04-06 (×27): 15 mL via OROMUCOSAL

## 2023-03-30 MED ORDER — ENSURE ENLIVE PO LIQD
237.0000 mL | Freq: Two times a day (BID) | ORAL | Status: DC
Start: 2023-03-30 — End: 2023-04-06
  Administered 2023-03-31 – 2023-04-06 (×10): 237 mL via ORAL

## 2023-03-30 MED ORDER — ORAL CARE MOUTH RINSE: 15.0000 mL | OROMUCOSAL | Status: DC | PRN

## 2023-03-30 MED ORDER — CHLORHEXIDINE GLUCONATE CLOTH 2 % EX PADS
6.0000 | MEDICATED_PAD | Freq: Every day | CUTANEOUS | Status: DC
  Administered 2023-03-30 – 2023-04-06 (×6): 6 via TOPICAL

## 2023-03-30 MED ORDER — IPRATROPIUM-ALBUTEROL 0.5-2.5 (3) MG/3ML IN SOLN
3.0000 mL | Freq: Three times a day (TID) | RESPIRATORY_TRACT | Status: DC
  Administered 2023-03-30 – 2023-04-02 (×8): 3 mL via RESPIRATORY_TRACT
  Filled 2023-03-30 (×8): qty 3

## 2023-03-30 MED ORDER — UMECLIDINIUM BROMIDE 62.5 MCG/ACT IN AEPB
1.0000 | INHALATION_SPRAY | Freq: Every day | RESPIRATORY_TRACT | Status: DC
  Administered 2023-03-30 – 2023-04-06 (×8): 1 via RESPIRATORY_TRACT
  Filled 2023-03-30 (×2): qty 7

## 2023-03-30 MED ORDER — BUDESON-GLYCOPYRROL-FORMOTEROL 160-9-4.8 MCG/ACT IN AERO: 2.0000 | INHALATION_SPRAY | Freq: Two times a day (BID) | RESPIRATORY_TRACT | Status: DC

## 2023-03-30 MED ORDER — MOMETASONE FURO-FORMOTEROL FUM 200-5 MCG/ACT IN AERO
2.0000 | INHALATION_SPRAY | Freq: Two times a day (BID) | RESPIRATORY_TRACT | Status: DC
  Administered 2023-03-30 – 2023-04-06 (×15): 2 via RESPIRATORY_TRACT
  Filled 2023-03-30: qty 8.8

## 2023-03-30 MED ORDER — CHLORHEXIDINE GLUCONATE CLOTH 2 % EX PADS: 6.0000 | MEDICATED_PAD | Freq: Every day | CUTANEOUS | Status: DC

## 2023-03-30 NOTE — Progress Notes (Signed)
 PROGRESS NOTE    [name] [name]  ZOX:[id number] DOB: [birth year] DOA: 03/29/2023 PCP: [name], MD  Chief Complaint  Patient presents with   Respiratory Distress    Hospital Course:  [name] [name] is 78 y.o. male with COPD, chronic hypoxic respiratory failure, hypertension, hyperlipidemia, anxiety, chronic heart failure with preserved EF, alcohol dependence, recent fall with subsequent left rib fractures who presents from home with shortness of breath.  On EMS arrival he was on his normal 3 L O2 but saturating into the 70s.  He received IV Solu-Medrol, magnesium, Atrovent, albuterol, and was placed on nonrebreather.  Upon arrival to the ED he was afebrile and still saturating in the 70s with nonrebreather.  He was also tachypneic and tachycardic.  Chest x-ray revealed opacity at left base most consistent with pleural effusion.  He was started on BiPAP and continuous albuterol and admitted for further monitoring.  Subjective: Patient was able to be weaned off of BiPAP this morning.  He is now saturating easily on 4 L O2.  Reports he is feeling somewhat better.  He believes he was discharged too quickly from the hospital on last admission.  Believes he is going to take a longer time in the hospital this time.  He would like to work with physical therapy while he is here.  He also wants to be sure that he is receiving all of his home medications.   Objective: Vitals:   03/30/23 0400 03/30/23 0500 03/30/23 0600 03/30/23 0637  BP: 109/63 (!) 121/55 (!) 127/59   Pulse: 72 66 65   Resp: 19 14 15    Temp:      TempSrc:      SpO2: 95% 97% 97% 96%  Weight:      Height:        Intake/Output Summary (Last 24 hours) at 03/30/2023 0729 Last data filed at 03/30/2023 0429 Gross per 24 hour  Intake 96.72 ml  Output --  Net 96.72 ml   Filed Weights   03/29/23 1748  Weight: 94.9 kg    Examination: General exam: Appears calm and comfortable, NAD  Respiratory system: Tachypneic,  pursed lip breathing, poor aeration bilaterally Cardiovascular system: S1 & S2 heard, RRR.  Gastrointestinal system: Abdomen is nondistended, soft and nontender.  Neuro: Alert and oriented. No focal neurological deficits. Extremities: Symmetric, expected ROM Skin: No rashes, lesions Psychiatry: Demonstrates appropriate judgement and insight. Mood & affect appropriate for situation.   Assessment & Plan:  Principal Problem:   COPD with acute exacerbation (HCC) Active Problems:   Acute on chronic respiratory failure with hypoxia (HCC)   Essential hypertension   Generalized anxiety disorder with panic attacks   Rib fracture   Chronic heart failure with preserved ejection fraction (HFpEF) (HCC)    COPD exacerbation Acute on chronic respiratory failure - At baseline patient requires 3 L O2 - On 4 L currently, wean as tolerated  - Proceed with sputum culture - Continue steroids, wean as tolerated - Continue nebs - Currently on ceftriaxone, procal <1, doubt pna. No WBC or fever. DC abx. -- Poor quality chest x-ray with opacity at left lower lung, thought to be pleural effusion. Repeat CXR in AM  -- PT/OT   Heart failure preserved EF Left Pleural Effusion - Most recent echo June 2024 shows preserved EF and grade 1 diastolic dysfunction - On Lasix given concern for pleural effusion as above - Strict I's and O's  Rib fractures - Recently hospitalized and discharged 3/16 with new rib  fractures - Continues to have pain with deep inspiration, cough, movement.  Continue pain control - I-S and FV  Hypertension - Resume home meds Monitor  Hyperlipidemia - Continue statin  Alcohol dependence - Initiate CIWA protocol, as needed Ativan - Last drink: 4/20 - Supplemental vitamins  Anxiety - Resume home meds  Rheumatoid arthritis - On methotrexate at home  DVT prophylaxis: Lovenox   Code Status: Full Code Family Communication:  Discussed directly with patient Disposition:   Inpatient, still hospitalized for supplemental O2, will discharge to home vs rehab when stable  Consultants:    Procedures:    Antimicrobials:  Anti-infectives (From admission, onward)    Start     Dose/Rate Route Frequency Ordered Stop   03/29/23 2200  cefTRIAXone (ROCEPHIN) 1 g in sodium chloride 0.9 % 100 mL IVPB        1 g 200 mL/hr over 30 Minutes Intravenous Every 24 hours 03/29/23 2010 04/03/23 2159       Data Reviewed: I have personally reviewed following labs and imaging studies CBC: Recent Labs  Lab 03/23/23 0809 03/24/23 1048 03/29/23 1742 03/30/23 0237  WBC 4.4 5.7 9.1 7.7  NEUTROABS  --  3.4 6.8  --   HGB 11.4* 12.7* 12.7* 11.4*  HCT 33.1* 37.3* 38.6* 34.7*  MCV 106.4* 108.7* 109.7* 109.5*  PLT 104* 130* 183 159   Basic Metabolic Panel: Recent Labs  Lab 03/24/23 1048 03/29/23 1742 03/30/23 0237  NA 134* 133* 133*  K 3.7 3.8 3.5  CL 93* 94* 92*  CO2 30 31 30   GLUCOSE 96 117* 190*  BUN 7* 10 12  CREATININE 0.75 0.54* 0.60*  CALCIUM 9.1 8.2* 8.6*   GFR: Estimated Creatinine Clearance: 93.8 mL/min (A) (by C-G formula based on SCr of 0.6 mg/dL (L)). Liver Function Tests: No results for input(s): "AST", "ALT", "ALKPHOS", "BILITOT", "PROT", "ALBUMIN" in the last 168 hours. CBG: No results for input(s): "GLUCAP" in the last 168 hours.  Recent Results (from the past 240 hours)  MRSA Next Gen by PCR, Nasal     Status: None   Collection Time: 03/30/23  1:53 AM   Specimen: Nasal Mucosa; Nasal Swab  Result Value Ref Range Status   MRSA by PCR Next Gen NOT DETECTED NOT DETECTED Final    Comment: (NOTE) The GeneXpert MRSA Assay (FDA approved for NASAL specimens only), is one component of a comprehensive MRSA colonization surveillance program. It is not intended to diagnose MRSA infection nor to guide or monitor treatment for MRSA infections. Test performance is not FDA approved in patients less than 58 years old. Performed at [facility], [address] [place], [place] [address]      Radiology Studies: DG Chest Portable 1 View Result Date: 03/29/2023 CLINICAL DATA:  Concern for left apical pneumothorax. Respiratory distress. EXAM: PORTABLE CHEST 1 VIEW COMPARISON:  03/24/2023, CT 03/22/2023 FINDINGS: The patient is significantly rotated. The patient's chin obscures the left lung apex. Allowing for this, no evidence of pneumothorax. Persistent increased density at the left lung base, likely effusion. Chronic bronchial thickening. Heart size not well assessed on provided view. Known left rib fractures are not well demonstrated. IMPRESSION: 1. No evidence of pneumothorax. Please note current exam is limited, significantly rotated and the patient's chin obscures the left lung apex. 2. Persistent increased density at the left lung base, likely effusion. Known left rib fractures are not well seen on the current exam. Electronically Signed   By: Ivette Loyal.D.  On: 03/29/2023 19:23    Scheduled Meds:  amLODipine  5 mg Oral Daily   atorvastatin  20 mg Oral Daily   Chlorhexidine Gluconate Cloth  6 each Topical Daily   enoxaparin (LOVENOX) injection  40 mg Subcutaneous Q24H   folic acid  1 mg Oral Daily   furosemide  40 mg Oral Daily   ipratropium-albuterol  3 mL Nebulization Q6H   LORazepam  0-4 mg Intravenous Q6H   Followed by   Melene Muller ON 04/01/2023] LORazepam  0-4 mg Intravenous Q12H   methylPREDNISolone (SOLU-MEDROL) injection  125 mg Intravenous Q12H   Followed by   Melene Muller ON 03/31/2023] predniSONE  40 mg Oral Q breakfast   multivitamin with minerals  1 tablet Oral Daily   mouth rinse  15 mL Mouth Rinse 4 times per day   sertraline  100 mg Oral Daily   sodium chloride flush  3 mL Intravenous Q12H   thiamine  100 mg Oral Daily   Or   thiamine  100 mg Intravenous Daily   Continuous Infusions:  albuterol 10 mg/hr (03/29/23 1821)   cefTRIAXone (ROCEPHIN)  IV Stopped (03/29/23 2237)     LOS: 1 day  MDM:  Patient is high risk for one or more organ failure.  They necessitate ongoing hospitalization for continued IV therapies and subsequent lab monitoring. Total time spent interpreting labs and vitals, coordinating care amongst consultants and care team members, directly assessing and discussing care with the patient and/or family: 55 min   Debarah Crape, DO [facility]  To contact the attending physician between 7A-7P please use Epic Chat. To contact the covering physician during after hours 7P-7A, please review Amion.   03/30/2023, 7:29 AM   *This document has been created with the assistance of dictation software. Please excuse typographical errors. *

## 2023-03-30 NOTE — Progress Notes (Signed)
 Pt transported on bipap with RN from ED to ICU without any complications. Pt is comfortable and tolerating it well at this time. Duo neb given.

## 2023-03-30 NOTE — Plan of Care (Signed)

## 2023-03-31 ENCOUNTER — Inpatient Hospital Stay (HOSPITAL_COMMUNITY)

## 2023-03-31 DIAGNOSIS — J9621 Acute and chronic respiratory failure with hypoxia: Secondary | ICD-10-CM | POA: Diagnosis not present

## 2023-03-31 DIAGNOSIS — F411 Generalized anxiety disorder: Secondary | ICD-10-CM | POA: Diagnosis not present

## 2023-03-31 DIAGNOSIS — I10 Essential (primary) hypertension: Secondary | ICD-10-CM | POA: Diagnosis not present

## 2023-03-31 DIAGNOSIS — J441 Chronic obstructive pulmonary disease with (acute) exacerbation: Secondary | ICD-10-CM | POA: Diagnosis not present

## 2023-03-31 LAB — COMPREHENSIVE METABOLIC PANEL
ALT: 29 U/L (ref 0–44)
AST: 21 U/L (ref 15–41)
Albumin: 3 g/dL — ABNORMAL LOW (ref 3.5–5.0)
Alkaline Phosphatase: 92 U/L (ref 38–126)
Anion gap: 9 (ref 5–15)
BUN: 16 mg/dL (ref 8–23)
CO2: 31 mmol/L (ref 22–32)
Calcium: 9 mg/dL (ref 8.9–10.3)
Chloride: 96 mmol/L — ABNORMAL LOW (ref 98–111)
Creatinine, Ser: 0.6 mg/dL — ABNORMAL LOW (ref 0.61–1.24)
GFR, Estimated: >60 mL/min (ref >60)
Glucose, Bld: 184 mg/dL — ABNORMAL HIGH (ref 70–99)
Potassium: 3.9 mmol/L (ref 3.5–5.1)
Sodium: 136 mmol/L (ref 135–145)
Total Bilirubin: 0.3 mg/dL (ref 0.0–1.2)
Total Protein: 6.4 g/dL — ABNORMAL LOW (ref 6.5–8.1)

## 2023-03-31 LAB — CBC WITH DIFFERENTIAL/PLATELET
Abs Immature Granulocytes: 0.05 10*3/uL (ref 0.00–0.07)
Basophils Absolute: 0 10*3/uL (ref 0.0–0.1)
Basophils Relative: 0 %
Eosinophils Absolute: 0 10*3/uL (ref 0.0–0.5)
Eosinophils Relative: 0 %
HCT: 34 % — ABNORMAL LOW (ref 39.0–52.0)
Hemoglobin: 11.1 g/dL — ABNORMAL LOW (ref 13.0–17.0)
Immature Granulocytes: 1 %
Lymphocytes Relative: 4 %
Lymphs Abs: 0.4 10*3/uL — ABNORMAL LOW (ref 0.7–4.0)
MCH: 36 pg — ABNORMAL HIGH (ref 26.0–34.0)
MCHC: 32.6 g/dL (ref 30.0–36.0)
MCV: 110.4 fL — ABNORMAL HIGH (ref 80.0–100.0)
Monocytes Absolute: 0.3 10*3/uL (ref 0.1–1.0)
Monocytes Relative: 3 %
Neutro Abs: 7.6 10*3/uL (ref 1.7–7.7)
Neutrophils Relative %: 92 %
Platelets: 152 10*3/uL (ref 150–400)
RBC: 3.08 MIL/uL — ABNORMAL LOW (ref 4.22–5.81)
RDW: 15.7 % — ABNORMAL HIGH (ref 11.5–15.5)
WBC: 8.3 10*3/uL (ref 4.0–10.5)
nRBC: 0 % (ref 0.0–0.2)

## 2023-03-31 LAB — PHOSPHORUS: Phosphorus: 3 mg/dL (ref 2.5–4.6)

## 2023-03-31 LAB — MAGNESIUM: Magnesium: 2.1 mg/dL (ref 1.7–2.4)

## 2023-03-31 MED ORDER — ALPRAZOLAM 0.25 MG PO TABS
0.2500 mg | ORAL_TABLET | Freq: Two times a day (BID) | ORAL | Status: DC | PRN
Start: 2023-03-31 — End: 2023-04-06
  Administered 2023-03-31 – 2023-04-05 (×9): 0.25 mg via ORAL
  Filled 2023-03-31 (×9): qty 1

## 2023-03-31 MED ORDER — FUROSEMIDE 10 MG/ML IJ SOLN
40.0000 mg | Freq: Two times a day (BID) | INTRAMUSCULAR | Status: DC
  Administered 2023-03-31 – 2023-04-02 (×6): 40 mg via INTRAVENOUS
  Filled 2023-03-31 (×6): qty 4

## 2023-03-31 MED ORDER — LACTULOSE 10 GM/15ML PO SOLN
20.0000 g | Freq: Three times a day (TID) | ORAL | Status: DC
  Administered 2023-03-31 – 2023-04-01 (×3): 20 g via ORAL
  Filled 2023-03-31 (×3): qty 30

## 2023-03-31 NOTE — TOC Initial Note (Signed)
 Transition of Care [facility]) - Initial/Assessment Note    Patient Details  Name: [name] [name] MRN: [id number] Date of Birth: 12-20-1945  Transition of Care [facility]) CM/SW Contact:    [name] Prows, RN Phone Number: 03/31/2023, 3:58 PM  Clinical Narrative:                 [facility] consulted for SA counseling/education, and SDOH risks; spoke w/ pt in room; pt says he lives at home w/ his dtr [name] [name] [phone]); he plans to return at d/c; pt says family will provide transportation; he denies SDOH risks; pt verified insurance/PCP; pt says he has cane, walker, Rolator, wheelchair, shower chair; he also has HH aide w/ [facility], and home oxygen w/ [employer]; pt says he uses a portable concentrator which he plans to use for transport home; pt declined resources for SA counseling/education; TOC will follow.  Expected Discharge Plan: Home w Home Health Services Barriers to Discharge: Continued Medical Work up   Patient Goals and CMS Choice Patient states their goals for this hospitalization and ongoing recovery are:: home CMS Medicare.gov Compare Post Acute Care list provided to:: Patient   [place] ownership interest in [facility].provided to:: Patient    Expected Discharge Plan and Services   Discharge Planning Services: CM Consult   Living arrangements for the past 2 months: Apartment                                      Prior Living Arrangements/Services Living arrangements for the past 2 months: Apartment Lives with:: Adult Children Patient language and need for interpreter reviewed:: Yes Do you feel safe going back to the place where you live?: Yes      Need for Family Participation in Patient Care: Yes (Comment) Care giver support system in place?: Yes (comment) Current home services: DME, Homehealth aide (cane, walker, Rolator, wheelchair, shower chair; HH aide w/ [facility]; home oxygen w/ [employer]) Criminal Activity/Legal Involvement  Pertinent to Current Situation/Hospitalization: No - Comment as needed  Activities of Daily Living   ADL Screening (condition at time of admission) Independently performs ADLs?: Yes (appropriate for developmental age) Is the patient deaf or have difficulty hearing?: No Does the patient have difficulty seeing, even when wearing glasses/contacts?: No Does the patient have difficulty concentrating, remembering, or making decisions?: No  Permission Sought/Granted Permission sought to share information with : Case Manager Permission granted to share information with : Yes, Verbal Permission Granted  Share Information with NAME: Case Manager     Permission granted to share info w Relationship: [name] [name] (dtr) [phone]     Emotional Assessment Appearance:: Appears stated age Attitude/Demeanor/Rapport: Gracious Affect (typically observed): Accepting Orientation: : Oriented to Self, Oriented to Place, Oriented to  Time, Oriented to Situation Alcohol / Substance Use: Not Applicable Psych Involvement: No (comment)  Admission diagnosis:  COPD exacerbation (HCC) [J44.1] COPD with acute exacerbation (HCC) [J44.1] Patient Active Problem List   Diagnosis Date Noted   COPD with acute exacerbation (HCC) 03/29/2023   Chronic heart failure with preserved ejection fraction (HFpEF) (HCC) 03/29/2023   Pleural effusion 03/23/2023   Rib fracture 03/22/2023   Abnormal CT lung screening 11/12/2022   Rheumatoid arthritis (HCC) 11/12/2022   Constipation 07/08/2022   GERD without esophagitis 07/08/2022   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   Shortness of breath 07/06/2022   Acute on chronic respiratory failure with hypoxia (  HCC) 07/04/2022   Alcohol abuse 07/04/2022   Macrocytosis 07/04/2022   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   Swelling of lower extremity 06/24/2020   Former smoker 09/04/2019   Coronary artery calcification seen on CT scan  08/12/2019   Essential hypertension 08/12/2019   Hyperlipidemia 08/12/2019   Chronic respiratory failure with hypoxia (HCC) 03/30/2019   COPD (chronic obstructive pulmonary disease) (HCC) 03/18/2018   PCP:  [name], MD Pharmacy:   [facility] [phone] - [place], [place] - [address] AT CORNER OF [facility] [address] [place] [address] Phone: [phone] Fax: [phone]  [facility] - [place], [place] - [address] [place] [address] Phone: [phone] Fax: [phone]  [facility] [address] [place] [address] Phone: [phone] Fax: [phone]     Social Drivers of Health (SDOH) Social History: SDOH Screenings   Food Insecurity: No Food Insecurity (03/31/2023)  Housing: Low Risk  (03/31/2023)  Transportation Needs: No Transportation Needs (03/31/2023)  Utilities: Not At Risk (03/31/2023)  Depression (PHQ2-9): Low Risk  (03/09/2019)  Social Connections: Socially Isolated (03/30/2023)  Tobacco Use: Medium Risk (03/29/2023)   SDOH Interventions: Food Insecurity Interventions: Intervention Not Indicated, Inpatient TOC Housing Interventions: Intervention Not Indicated, Inpatient TOC Transportation Interventions: Intervention Not Indicated, Inpatient TOC Utilities Interventions: Intervention Not Indicated, Inpatient TOC   Readmission Risk Interventions    03/31/2023    3:55 PM 07/08/2022   10:55 AM  Readmission Risk Prevention Plan  Post Dischage Appt  Complete  Medication Screening  Complete  Transportation Screening Complete Complete  PCP or Specialist Appt within 3-5 Days Complete   HRI or Home Care Consult Complete   Social Work Consult for Recovery Care Planning/Counseling Complete   Palliative Care Screening Not Applicable   Medication Review Oceanographer) Complete

## 2023-03-31 NOTE — Progress Notes (Signed)
 PROGRESS NOTE    [name] [name]  ZOX:[id number] DOB: 06-14-1945 DOA: 03/29/2023 PCP: [name], MD  Chief Complaint  Patient presents with   Respiratory Distress    Hospital Course:  [name] [name] is 78 y.o. male with COPD, chronic hypoxic respiratory failure, hypertension, hyperlipidemia, anxiety, chronic heart failure with preserved EF, alcohol dependence, recent fall with subsequent left rib fractures who presents from home with shortness of breath.  On EMS arrival he was on his normal 3 L O2 but saturating into the 70s.  He received IV Solu-Medrol, magnesium, Atrovent, albuterol, and was placed on nonrebreather.  Upon arrival to the ED he was afebrile and still saturating in the 70s with nonrebreather.  He was also tachypneic and tachycardic.  Chest x-ray revealed opacity at left base most consistent with pleural effusion.  He was started on BiPAP and continuous albuterol and admitted for further monitoring.  Subjective: No acute events overnight. On evaluation today patient is complaining of some anxiety.  He is requesting his Xanax, reports the Atarax is not sufficient at this time.  He also endorses worsening shortness of breath. Chest x-ray today revealing worsening effusions.  Have started IV Lasix.  Discussed the plan with the patient who endorses understanding.  He also endorses ongoing issues with abdominal bloating and bowel movements.  He is interested in a more aggressive bowel regimen.   Objective: Vitals:   03/31/23 0403 03/31/23 0500 03/31/23 0600 03/31/23 0738  BP:  (!) 156/52 (!) 151/132   Pulse:  62 72   Resp:  20 16   Temp: 98.4 F (36.9 C)     TempSrc: Axillary     SpO2:  100% 97% 96%  Weight:      Height:        Intake/Output Summary (Last 24 hours) at 03/31/2023 0750 Last data filed at 03/31/2023 0551 Gross per 24 hour  Intake 3 ml  Output 1450 ml  Net -1447 ml   Filed Weights   03/29/23 1748  Weight: 94.9 kg     Examination: General exam: Appears calm and comfortable, NAD  Respiratory system: Tachypnea, crackles at lower lung bases.  Poor aeration bilaterally Cardiovascular system: S1 & S2 heard, RRR.  Gastrointestinal system: Abdomen is nondistended, soft and nontender.  Neuro: Alert and oriented. No focal neurological deficits. Extremities: Symmetric, expected ROM Skin: Bruises in various stages of healing bilateral upper extremities. Psychiatry: Demonstrates appropriate judgement and insight. Mood & affect appropriate for situation.   Assessment & Plan:  Principal Problem:   COPD with acute exacerbation (HCC) Active Problems:   Acute on chronic respiratory failure with hypoxia (HCC)   Essential hypertension   Generalized anxiety disorder with panic attacks   Rib fracture   Chronic heart failure with preserved ejection fraction (HFpEF) (HCC)    COPD exacerbation Acute on chronic respiratory failure - At baseline patient requires 3 L O2 - Currently requiring 4 L O2, continue to wean as tolerated - Continue to follow sputum culture - On steroids, wean as tolerated - Continue nebs every 4 -Initially on ceftriaxone, procal <1, doubt pna. No WBC or fever. DC abx. -- CXR reveals persistent left sided effusion. -- PT/OT   Heart failure preserved EF Left Pleural Effusion - Most recent echo June 2024 shows preserved EF and grade 1 diastolic dysfunction - Been on p.o. Lasix, will initiate IV twice daily. - Strict I's and O's  Rib fractures - Recently hospitalized and discharged 3/16 with new rib fractures - Continues to  have pain with deep inspiration, cough, movement.  Continue pain control - Continue incentive spirometry and flutter valve  Hypertension - Resume home meds Monitor for need for titration  Hyperlipidemia - Continue statin  Alcohol dependence - Continue CIWA protocol.  Ativan as needed. - Continue to monitor.  No evidence of withdrawal at this time. - Last  drink: 4/20 - Supplemental vitamins  Anxiety - Resume home meds - Xanax available as needed once daily and at night.  Atarax remains available.  Rheumatoid arthritis - On methotrexate at home  Constipation - Chronic worsening secondary to opioids from rib fracture pain - Initiate lactulose, titrate as needed  DVT prophylaxis: Lovenox   Code Status: Full Code Family Communication:  Discussed directly with patient Disposition: Inpatient, still hospitalized for supplemental O2, IV diuresis.  Will eventually discharge home, likely with home health.  Consultants:    Procedures:    Antimicrobials:  Anti-infectives (From admission, onward)    Start     Dose/Rate Route Frequency Ordered Stop   03/29/23 2200  cefTRIAXone (ROCEPHIN) 1 g in sodium chloride 0.9 % 100 mL IVPB  Status:  Discontinued        1 g 200 mL/hr over 30 Minutes Intravenous Every 24 hours 03/29/23 2010 03/30/23 1448       Data Reviewed: I have personally reviewed following labs and imaging studies CBC: Recent Labs  Lab 03/24/23 1048 03/29/23 1742 03/30/23 0237 03/31/23 0241  WBC 5.7 9.1 7.7 8.3  NEUTROABS 3.4 6.8  --  7.6  HGB 12.7* 12.7* 11.4* 11.1*  HCT 37.3* 38.6* 34.7* 34.0*  MCV 108.7* 109.7* 109.5* 110.4*  PLT 130* 183 159 152   Basic Metabolic Panel: Recent Labs  Lab 03/24/23 1048 03/29/23 1742 03/30/23 0237 03/31/23 0241  NA 134* 133* 133* 136  K 3.7 3.8 3.5 3.9  CL 93* 94* 92* 96*  CO2 30 31 30 31   GLUCOSE 96 117* 190* 184*  BUN 7* 10 12 16   CREATININE 0.75 0.54* 0.60* 0.60*  CALCIUM 9.1 8.2* 8.6* 9.0  MG  --   --   --  2.1  PHOS  --   --   --  3.0   GFR: Estimated Creatinine Clearance: 93.8 mL/min (A) (by C-G formula based on SCr of 0.6 mg/dL (L)). Liver Function Tests: Recent Labs  Lab 03/31/23 0241  AST 21  ALT 29  ALKPHOS 92  BILITOT 0.3  PROT 6.4*  ALBUMIN 3.0*   CBG: No results for input(s): "GLUCAP" in the last 168 hours.  Recent Results (from the past 240  hours)  MRSA Next Gen by PCR, Nasal     Status: None   Collection Time: 03/30/23  1:53 AM   Specimen: Nasal Mucosa; Nasal Swab  Result Value Ref Range Status   MRSA by PCR Next Gen NOT DETECTED NOT DETECTED Final    Comment: (NOTE) The GeneXpert MRSA Assay (FDA approved for NASAL specimens only), is one component of a comprehensive MRSA colonization surveillance program. It is not intended to diagnose MRSA infection nor to guide or monitor treatment for MRSA infections. Test performance is not FDA approved in patients less than 73 years old. Performed at [facility], [address] [place], [place] [address]      Radiology Studies: DG Chest Portable 1 View Result Date: 03/29/2023 CLINICAL DATA:  Concern for left apical pneumothorax. Respiratory distress. EXAM: PORTABLE CHEST 1 VIEW COMPARISON:  03/24/2023, CT 03/22/2023 FINDINGS: The patient is significantly rotated. The patient's chin  obscures the left lung apex. Allowing for this, no evidence of pneumothorax. Persistent increased density at the left lung base, likely effusion. Chronic bronchial thickening. Heart size not well assessed on provided view. Known left rib fractures are not well demonstrated. IMPRESSION: 1. No evidence of pneumothorax. Please note current exam is limited, significantly rotated and the patient's chin obscures the left lung apex. 2. Persistent increased density at the left lung base, likely effusion. Known left rib fractures are not well seen on the current exam. Electronically Signed   By: Narda Rutherford M.D.   On: 03/29/2023 19:23    Scheduled Meds:  amLODipine  5 mg Oral Daily   atorvastatin  20 mg Oral Daily   Chlorhexidine Gluconate Cloth  6 each Topical Daily   enoxaparin (LOVENOX) injection  40 mg Subcutaneous Q24H   feeding supplement  237 mL Oral BID BM   folic acid  1 mg Oral Daily   furosemide  40 mg Oral Daily   ipratropium-albuterol  3 mL Nebulization TID    mometasone-formoterol  2 puff Inhalation BID   And   umeclidinium bromide  1 puff Inhalation Daily   multivitamin with minerals  1 tablet Oral Daily   mouth rinse  15 mL Mouth Rinse 4 times per day   predniSONE  40 mg Oral Q breakfast   sertraline  100 mg Oral Daily   sodium chloride flush  3 mL Intravenous Q12H   thiamine  100 mg Oral Daily   Or   thiamine  100 mg Intravenous Daily   Continuous Infusions:     LOS: 2 days  MDM: Patient is high risk for one or more organ failure.  They necessitate ongoing hospitalization for continued IV therapies and subsequent lab monitoring. Total time spent interpreting labs and vitals, coordinating care amongst consultants and care team members, directly assessing and discussing care with the patient and/or family: 55 min   Debarah Crape, DO [facility]  To contact the attending physician between 7A-7P please use Epic Chat. To contact the covering physician during after hours 7P-7A, please review Amion.   03/31/2023, 7:50 AM   *This document has been created with the assistance of dictation software. Please excuse typographical errors. *

## 2023-03-31 NOTE — Plan of Care (Signed)

## 2023-04-01 DIAGNOSIS — J9621 Acute and chronic respiratory failure with hypoxia: Secondary | ICD-10-CM | POA: Diagnosis not present

## 2023-04-01 DIAGNOSIS — I10 Essential (primary) hypertension: Secondary | ICD-10-CM | POA: Diagnosis not present

## 2023-04-01 DIAGNOSIS — J441 Chronic obstructive pulmonary disease with (acute) exacerbation: Secondary | ICD-10-CM | POA: Diagnosis not present

## 2023-04-01 DIAGNOSIS — F411 Generalized anxiety disorder: Secondary | ICD-10-CM | POA: Diagnosis not present

## 2023-04-01 LAB — CBC WITH DIFFERENTIAL/PLATELET
Abs Immature Granulocytes: 0.09 10*3/uL — ABNORMAL HIGH (ref 0.00–0.07)
Basophils Absolute: 0 10*3/uL (ref 0.0–0.1)
Basophils Relative: 0 %
Eosinophils Absolute: 0 10*3/uL (ref 0.0–0.5)
Eosinophils Relative: 0 %
HCT: 35.9 % — ABNORMAL LOW (ref 39.0–52.0)
Hemoglobin: 12 g/dL — ABNORMAL LOW (ref 13.0–17.0)
Immature Granulocytes: 1 %
Lymphocytes Relative: 10 %
Lymphs Abs: 0.9 10*3/uL (ref 0.7–4.0)
MCH: 36.9 pg — ABNORMAL HIGH (ref 26.0–34.0)
MCHC: 33.4 g/dL (ref 30.0–36.0)
MCV: 110.5 fL — ABNORMAL HIGH (ref 80.0–100.0)
Monocytes Absolute: 1.1 10*3/uL — ABNORMAL HIGH (ref 0.1–1.0)
Monocytes Relative: 12 %
Neutro Abs: 7.1 10*3/uL (ref 1.7–7.7)
Neutrophils Relative %: 77 %
Platelets: 188 10*3/uL (ref 150–400)
RBC: 3.25 MIL/uL — ABNORMAL LOW (ref 4.22–5.81)
RDW: 15.9 % — ABNORMAL HIGH (ref 11.5–15.5)
WBC: 9.2 10*3/uL (ref 4.0–10.5)
nRBC: 0 % (ref 0.0–0.2)

## 2023-04-01 LAB — COMPREHENSIVE METABOLIC PANEL
ALT: 35 U/L (ref 0–44)
AST: 31 U/L (ref 15–41)
Albumin: 3 g/dL — ABNORMAL LOW (ref 3.5–5.0)
Alkaline Phosphatase: 90 U/L (ref 38–126)
Anion gap: 11 (ref 5–15)
BUN: 18 mg/dL (ref 8–23)
CO2: 33 mmol/L — ABNORMAL HIGH (ref 22–32)
Calcium: 9 mg/dL (ref 8.9–10.3)
Chloride: 94 mmol/L — ABNORMAL LOW (ref 98–111)
Creatinine, Ser: 0.78 mg/dL (ref 0.61–1.24)
GFR, Estimated: >60 mL/min (ref >60)
Glucose, Bld: 146 mg/dL — ABNORMAL HIGH (ref 70–99)
Potassium: 3.4 mmol/L — ABNORMAL LOW (ref 3.5–5.1)
Sodium: 138 mmol/L (ref 135–145)
Total Bilirubin: 0.6 mg/dL (ref 0.0–1.2)
Total Protein: 6.3 g/dL — ABNORMAL LOW (ref 6.5–8.1)

## 2023-04-01 LAB — PHOSPHORUS: Phosphorus: 3.9 mg/dL (ref 2.5–4.6)

## 2023-04-01 LAB — MAGNESIUM: Magnesium: 2.1 mg/dL (ref 1.7–2.4)

## 2023-04-01 MED ORDER — METHOTREXATE SODIUM CHEMO INJECTION 50 MG/2ML: 20.0000 mg | INTRAMUSCULAR | Status: DC

## 2023-04-01 MED ORDER — PREDNISONE 20 MG PO TABS
40.0000 mg | ORAL_TABLET | Freq: Two times a day (BID) | ORAL | Status: AC
  Administered 2023-04-01 – 2023-04-05 (×8): 40 mg via ORAL
  Filled 2023-04-01 (×8): qty 2

## 2023-04-01 MED ORDER — DICLOFENAC SODIUM 1 % EX GEL
2.0000 g | Freq: Two times a day (BID) | CUTANEOUS | Status: DC | PRN
  Administered 2023-04-02: 2 g via TOPICAL
  Filled 2023-04-01: qty 100

## 2023-04-01 MED ORDER — LACTULOSE 10 GM/15ML PO SOLN
30.0000 g | Freq: Three times a day (TID) | ORAL | Status: DC
  Administered 2023-04-01 – 2023-04-02 (×3): 30 g via ORAL
  Filled 2023-04-01 (×5): qty 60

## 2023-04-01 MED ORDER — BISACODYL 10 MG RE SUPP
10.0000 mg | Freq: Every day | RECTAL | Status: DC | PRN
  Administered 2023-04-01 – 2023-04-05 (×2): 10 mg via RECTAL
  Filled 2023-04-01 (×3): qty 1

## 2023-04-01 NOTE — Evaluation (Signed)
 Physical Therapy Evaluation Patient Details Name: [name] [name] MRN: [id number] DOB: October 16, 1945 Today's Date: 04/01/2023  History of Present Illness  78 y.o. male who presents from home with shortness of breath. Dx of COPD exacerbation. Pt with h/o COPD, chronic hypoxic respiratory failure, hypertension, hyperlipidemia, anxiety, chronic heart failure with preserved EF, alcohol dependence, recent fall with subsequent left rib fractures 03/20/23, recent admission 3/14-3/16/25.  Clinical Impression  Pt admitted with above diagnosis. Pt ambulated 60' with RW, distance limited by 4/4 dyspnea, SpO2 dropped to 87% on 8L O2 while walking, verbal cues for pursed lip breathing. SpO2 up to 93% on 8L O2 after ~2 minutes seated rest.  Pt currently with functional limitations due to the deficits listed below (see PT Problem List). Pt will benefit from acute skilled PT to increase their independence and safety with mobility to allow discharge.           If plan is discharge home, recommend the following: A little help with walking and/or transfers;A little help with bathing/dressing/bathroom;Assistance with cooking/housework;Assist for transportation   Can travel by private vehicle        Equipment Recommendations None recommended by PT  Recommendations for Other Services       Functional Status Assessment Patient has had a recent decline in their functional status and demonstrates the ability to make significant improvements in function in a reasonable and predictable amount of time.     Precautions / Restrictions Precautions Precautions: Fall;Other (comment) Recall of Precautions/Restrictions: Intact Precaution/Restrictions Comments: monitor O2 Restrictions Weight Bearing Restrictions Per Provider Order: No      Mobility  Bed Mobility Overal bed mobility: Needs Assistance Bed Mobility: Supine to Sit     Supine to sit: Mod assist     General bed mobility comments: encouraged log  roll technique but patient prefers to "do it all at once". Mod A to raise trunk.    Transfers Overall transfer level: Needs assistance Equipment used: Rolling walker (2 wheels) Transfers: Sit to/from Stand Sit to Stand: Min assist, From elevated surface           General transfer comment: VCs for hand placement    Ambulation/Gait Ambulation/Gait assistance: Contact guard assist Gait Distance (Feet): 35 Feet Assistive device: Rolling walker (2 wheels) Gait Pattern/deviations: Step-through pattern Gait velocity: decreased     General Gait Details: distance limited by 4/4 dyspnea, SpO2 dropped to 87% on 8L O2 while walking, VCs for pursed lip breathing  Stairs            Wheelchair Mobility     Tilt Bed    Modified Rankin (Stroke Patients Only)       Balance Overall balance assessment: Modified Independent Sitting-balance support: No upper extremity supported, Feet supported Sitting balance-Leahy Scale: Good     Standing balance support: During functional activity, Reliant on assistive device for balance, Bilateral upper extremity supported Standing balance-Leahy Scale: Fair Standing balance comment: statically. Benefits from UE support dynamically                             Pertinent Vitals/Pain Pain Assessment Pain Score: 7  Pain Location: L ribs Pain Descriptors / Indicators: Tender, Sore, Grimacing, Guarding Pain Intervention(s): Limited activity within patient's tolerance, Monitored during session, Premedicated before session, Repositioned    Home Living Family/patient expects to be discharged to:: Private residence Living Arrangements: Children (daughter stays with pt for the past few months) Available Help at Discharge: Family;Available PRN/intermittently (  son also in town and pt has some friends who can help out) Type of Home: Apartment Home Access: Level entry       Home Layout: One level Home Equipment: Grab bars - toilet;Grab  bars - tub/shower;Cane - single Librarian, academic (2 wheels);Rollator (4 wheels);Shower seat;Hand held shower head;Adaptive equipment Additional Comments: on 3L home O2; children are local and can help at times    Prior Function Prior Level of Function : Independent/Modified Independent;Driving             Mobility Comments: SPC out of apt and in apt off and on ADLs Comments: 2x/week bathes, otherwise sponge bathes, dresses self. house keeper 1x/month, splits other cleaning with daughter     Extremity/Trunk Assessment   Upper Extremity Assessment Upper Extremity Assessment: Defer to OT evaluation    Lower Extremity Assessment Lower Extremity Assessment: Overall WFL for tasks assessed    Cervical / Trunk Assessment Cervical / Trunk Assessment: Kyphotic  Communication   Communication Communication: No apparent difficulties    Cognition Arousal: Alert Behavior During Therapy: WFL for tasks assessed/performed   PT - Cognitive impairments: No apparent impairments                         Following commands: Intact       Cueing Cueing Techniques: Verbal cues     General Comments      Exercises     Assessment/Plan    PT Assessment Patient needs continued PT services  PT Problem List Decreased activity tolerance;Decreased balance;Decreased mobility;Decreased safety awareness;Cardiopulmonary status limiting activity       PT Treatment Interventions DME instruction;Gait training;Functional mobility training;Therapeutic activities;Balance training;Patient/family education    PT Goals (Current goals can be found in the Care Plan section)  Acute Rehab PT Goals Patient Stated Goal: walk, decrease pain PT Goal Formulation: With patient Time For Goal Achievement: 04/15/23 Potential to Achieve Goals: Good    Frequency Min 3X/week     Co-evaluation               AM-PAC PT "6 Clicks" Mobility  Outcome Measure Help needed turning from your back  to your side while in a flat bed without using bedrails?: A Little Help needed moving from lying on your back to sitting on the side of a flat bed without using bedrails?: A Lot Help needed moving to and from a bed to a chair (including a wheelchair)?: A Little Help needed standing up from a chair using your arms (e.g., wheelchair or bedside chair)?: A Little Help needed to walk in hospital room?: A Little Help needed climbing 3-5 steps with a railing? : A Little 6 Click Score: 17    End of Session Equipment Utilized During Treatment: Oxygen;Gait belt Activity Tolerance: Patient limited by fatigue;Patient limited by pain Patient left: in chair;with call bell/phone within reach;with chair alarm set Nurse Communication: Mobility status PT Visit Diagnosis: Unsteadiness on feet (R26.81);Other abnormalities of gait and mobility (R26.89);History of falling (Z91.81);Muscle weakness (generalized) (M62.81);Difficulty in walking, not elsewhere classified (R26.2)    Time: 4098-1191 PT Time Calculation (min) (ACUTE ONLY): 13 min   Charges:   PT Evaluation $PT Eval Moderate Complexity: 1 Mod   PT General Charges $$ ACUTE PT VISIT: 1 Visit         Tamala Ser PT 04/01/2023  Acute Rehabilitation Services  Office [phone]

## 2023-04-01 NOTE — Progress Notes (Signed)
 PROGRESS NOTE    [name] [name]  ZOX:[id number] DOB: September 06, 1945 DOA: 03/29/2023 PCP: [name], MD  Chief Complaint  Patient presents with   Respiratory Distress    Hospital Course:  [name] [name] is 78 y.o. male with COPD, chronic hypoxic respiratory failure, hypertension, hyperlipidemia, anxiety, chronic heart failure with preserved EF, alcohol dependence, recent fall with subsequent left rib fractures who presents from home with shortness of breath.  On EMS arrival he was on his normal 3 L O2 but saturating into the 70s.  He received IV Solu-Medrol, magnesium, Atrovent, albuterol, and was placed on nonrebreather.  Upon arrival to the ED he was afebrile and still saturating in the 70s with nonrebreather.  He was also tachypneic and tachycardic.  Chest x-ray revealed opacity at left base most consistent with pleural effusion.  He was started on BiPAP and continuous albuterol and admitted for further monitoring in the progressive unit. He was able to wean to 4L quickly and improved with IV diuresis.  Subjective: No acute events overnight. On evaluation today patient reports he is feeling a bit better.  No bowel movements yet.  Continues to endorse pain on left side rib cage as well as a popping sensation with deep inspiration.    Objective: Vitals:   04/01/23 0300 04/01/23 0400 04/01/23 0500 04/01/23 0600  BP: (!) 144/45 (!) 151/58 (!) 148/65 (!) 146/60  Pulse: 71 65 64 64  Resp: (!) 21 17 18 20   Temp:  97.9 F (36.6 C)    TempSrc:  Oral    SpO2: 98% 99% 99% 97%  Weight:      Height:        Intake/Output Summary (Last 24 hours) at 04/01/2023 0707 Last data filed at 03/31/2023 2256 Gross per 24 hour  Intake 10 ml  Output 2300 ml  Net -2290 ml   Filed Weights   03/29/23 1748  Weight: 94.9 kg    Examination: General exam: Appears calm and comfortable, NAD  Respiratory system: On HFNC, crackles at lower lung bases, end expiratory wheeze  appreciated Cardiovascular system: S1 & S2 heard, RRR.  Gastrointestinal system: Abdomen is nondistended, soft and nontender.  Neuro: Alert and oriented. No focal neurological deficits. Extremities: Symmetric, expected ROM Skin: Bruises in various stages of healing bilateral upper extremities. Psychiatry: Demonstrates appropriate judgement and insight. Mood & affect appropriate for situation.   Assessment & Plan:  Principal Problem:   COPD with acute exacerbation (HCC) Active Problems:   Acute on chronic respiratory failure with hypoxia (HCC)   Essential hypertension   Generalized anxiety disorder with panic attacks   Rib fracture   Chronic heart failure with preserved ejection fraction (HFpEF) (HCC)    COPD exacerbation Acute on chronic respiratory failure - At baseline patient requires 3 L O2 - Currently requiring 4 L O2, continue wean as tolerated - On steroids, will begin weaning tomorrow if wheezing decreases - Continue scheduled nebs - Initially on ceftriaxone, procal <1, doubt pna. No WBC or fever. DC abx. -- CXR reveals persistent left sided effusion. -- PT/OT   Heart failure preserved EF Left Pleural Effusion - Most recent echo June 2024 shows preserved EF and grade 1 diastolic dysfunction - Currently on IV Lasix, continue to follow output.  Kidney function appears to be tolerating.  Will taper to p.o. when diuresis slows (on 40mg  daily at home) - Strict I's and O's  Rib fractures - Recently hospitalized and discharged 3/16 with new rib fractures - Still having pain with  deep inspiration, cough, movement.  Continue pain control - Encouraged incentive spirometry and flutter valve.  Hypertension -Home meds have been resumed, continue to titrate as needed  Hyperlipidemia - Continue statin  Alcohol dependence - Last drink: 4/20 - Continue CIWA protocol, no evidence of withdrawal at this time - Supplemental vitamins  Anxiety -Intermittently struggling with  anxiety attacks while admitted. - Xanax and Atarax available consistent with his old meds  Rheumatoid arthritis - On methotrexate at home  Constipation - Chronic. worsening secondary to opioids from rib fracture pain - On lactulose now, continue.  Titrate as needed  DVT prophylaxis: Lovenox   Code Status: Full Code Family Communication:  Discussed directly with patient Disposition: Inpatient, still hospitalized for supplemental O2, IV diuresis, and deconditioning beyond his baseline.  Will eventually discharge home, likely with home health.  Already has home O2.   Consultants:    Procedures:    Antimicrobials:  Anti-infectives (From admission, onward)    Start     Dose/Rate Route Frequency Ordered Stop   03/29/23 2200  cefTRIAXone (ROCEPHIN) 1 g in sodium chloride 0.9 % 100 mL IVPB  Status:  Discontinued        1 g 200 mL/hr over 30 Minutes Intravenous Every 24 hours 03/29/23 2010 03/30/23 1448       Data Reviewed: I have personally reviewed following labs and imaging studies CBC: Recent Labs  Lab 03/29/23 1742 03/30/23 0237 03/31/23 0241 04/01/23 0246  WBC 9.1 7.7 8.3 9.2  NEUTROABS 6.8  --  7.6 7.1  HGB 12.7* 11.4* 11.1* 12.0*  HCT 38.6* 34.7* 34.0* 35.9*  MCV 109.7* 109.5* 110.4* 110.5*  PLT 183 159 152 188   Basic Metabolic Panel: Recent Labs  Lab 03/29/23 1742 03/30/23 0237 03/31/23 0241 04/01/23 0246  NA 133* 133* 136 138  K 3.8 3.5 3.9 3.4*  CL 94* 92* 96* 94*  CO2 31 30 31  33*  GLUCOSE 117* 190* 184* 146*  BUN 10 12 16 18   CREATININE 0.54* 0.60* 0.60* 0.78  CALCIUM 8.2* 8.6* 9.0 9.0  MG  --   --  2.1 2.1  PHOS  --   --  3.0 3.9   GFR: Estimated Creatinine Clearance: 93.8 mL/min (by C-G formula based on SCr of 0.78 mg/dL). Liver Function Tests: Recent Labs  Lab 03/31/23 0241 04/01/23 0246  AST 21 31  ALT 29 35  ALKPHOS 92 90  BILITOT 0.3 0.6  PROT 6.4* 6.3*  ALBUMIN 3.0* 3.0*   CBG: No results for input(s): "GLUCAP" in the last  168 hours.  Recent Results (from the past 240 hours)  MRSA Next Gen by PCR, Nasal     Status: None   Collection Time: 03/30/23  1:53 AM   Specimen: Nasal Mucosa; Nasal Swab  Result Value Ref Range Status   MRSA by PCR Next Gen NOT DETECTED NOT DETECTED Final    Comment: (NOTE) The GeneXpert MRSA Assay (FDA approved for NASAL specimens only), is one component of a comprehensive MRSA colonization surveillance program. It is not intended to diagnose MRSA infection nor to guide or monitor treatment for MRSA infections. Test performance is not FDA approved in patients less than 43 years old. Performed at [facility], [address] [place], [place] [address]      Radiology Studies: DG Chest 1 View Result Date: 03/31/2023 CLINICAL DATA:  Pleural effusion, COPD. EXAM: CHEST  1 VIEW COMPARISON:  03/29/2023 and CT chest 03/22/2023. FINDINGS: Trachea is midline. Heart is enlarged, stable.  Thoracic aorta is calcified. Diffuse interstitial prominence and indistinctness superimposed on emphysema. Blunting of the left costophrenic angle may be due to lingular atelectasis/scarring. No right pleural fluid. IMPRESSION: Pulmonary edema superimposed on emphysema. Electronically Signed   By: Leanna Battles M.D.   On: 03/31/2023 09:07    Scheduled Meds:  amLODipine  5 mg Oral Daily   atorvastatin  20 mg Oral Daily   Chlorhexidine Gluconate Cloth  6 each Topical Daily   enoxaparin (LOVENOX) injection  40 mg Subcutaneous Q24H   feeding supplement  237 mL Oral BID BM   folic acid  1 mg Oral Daily   furosemide  40 mg Intravenous BID   ipratropium-albuterol  3 mL Nebulization TID   lactulose  20 g Oral TID   mometasone-formoterol  2 puff Inhalation BID   And   umeclidinium bromide  1 puff Inhalation Daily   multivitamin with minerals  1 tablet Oral Daily   mouth rinse  15 mL Mouth Rinse 4 times per day   predniSONE  40 mg Oral Q breakfast   sertraline  100 mg Oral Daily   sodium  chloride flush  3 mL Intravenous Q12H   thiamine  100 mg Oral Daily   Or   thiamine  100 mg Intravenous Daily   Continuous Infusions:     LOS: 3 days  MDM: Patient is high risk for one or more organ failure.  They necessitate ongoing hospitalization for continued IV therapies and subsequent lab monitoring. Total time spent interpreting labs and vitals, coordinating care amongst consultants and care team members, directly assessing and discussing care with the patient and/or family: 55 min   Debarah Crape, DO [facility]  To contact the attending physician between 7A-7P please use Epic Chat. To contact the covering physician during after hours 7P-7A, please review Amion.   04/01/2023, 7:07 AM   *This document has been created with the assistance of dictation software. Please excuse typographical errors. *

## 2023-04-01 NOTE — Plan of Care (Signed)

## 2023-04-01 NOTE — Progress Notes (Signed)
Bipap order is PRN; Bipap not needed at this time.

## 2023-04-02 ENCOUNTER — Telehealth: Payer: Self-pay | Admitting: Pulmonary Disease

## 2023-04-02 ENCOUNTER — Ambulatory Visit: Admitting: Cardiovascular Disease

## 2023-04-02 DIAGNOSIS — J441 Chronic obstructive pulmonary disease with (acute) exacerbation: Secondary | ICD-10-CM | POA: Diagnosis not present

## 2023-04-02 LAB — COMPREHENSIVE METABOLIC PANEL
ALT: 37 U/L (ref 0–44)
AST: 27 U/L (ref 15–41)
Albumin: 3.1 g/dL — ABNORMAL LOW (ref 3.5–5.0)
Alkaline Phosphatase: 97 U/L (ref 38–126)
Anion gap: 10 (ref 5–15)
BUN: 17 mg/dL (ref 8–23)
CO2: 32 mmol/L (ref 22–32)
Calcium: 9 mg/dL (ref 8.9–10.3)
Chloride: 94 mmol/L — ABNORMAL LOW (ref 98–111)
Creatinine, Ser: 0.5 mg/dL — ABNORMAL LOW (ref 0.61–1.24)
GFR, Estimated: >60 mL/min (ref >60)
Glucose, Bld: 146 mg/dL — ABNORMAL HIGH (ref 70–99)
Potassium: 3.6 mmol/L (ref 3.5–5.1)
Sodium: 136 mmol/L (ref 135–145)
Total Bilirubin: 0.6 mg/dL (ref 0.0–1.2)
Total Protein: 6.3 g/dL — ABNORMAL LOW (ref 6.5–8.1)

## 2023-04-02 LAB — CBC WITH DIFFERENTIAL/PLATELET
Abs Immature Granulocytes: 0.05 10*3/uL (ref 0.00–0.07)
Basophils Absolute: 0 10*3/uL (ref 0.0–0.1)
Basophils Relative: 0 %
Eosinophils Absolute: 0 10*3/uL (ref 0.0–0.5)
Eosinophils Relative: 0 %
HCT: 37.1 % — ABNORMAL LOW (ref 39.0–52.0)
Hemoglobin: 12.3 g/dL — ABNORMAL LOW (ref 13.0–17.0)
Immature Granulocytes: 1 %
Lymphocytes Relative: 8 %
Lymphs Abs: 0.6 10*3/uL — ABNORMAL LOW (ref 0.7–4.0)
MCH: 36.3 pg — ABNORMAL HIGH (ref 26.0–34.0)
MCHC: 33.2 g/dL (ref 30.0–36.0)
MCV: 109.4 fL — ABNORMAL HIGH (ref 80.0–100.0)
Monocytes Absolute: 0.7 10*3/uL (ref 0.1–1.0)
Monocytes Relative: 8 %
Neutro Abs: 6.9 10*3/uL (ref 1.7–7.7)
Neutrophils Relative %: 83 %
Platelets: 202 10*3/uL (ref 150–400)
RBC: 3.39 MIL/uL — ABNORMAL LOW (ref 4.22–5.81)
RDW: 15.3 % (ref 11.5–15.5)
WBC: 8.3 10*3/uL (ref 4.0–10.5)
nRBC: 0 % (ref 0.0–0.2)

## 2023-04-02 LAB — PHOSPHORUS: Phosphorus: 4.4 mg/dL (ref 2.5–4.6)

## 2023-04-02 LAB — BLOOD GAS, VENOUS
Acid-Base Excess: 9.7 mmol/L — ABNORMAL HIGH (ref 0.0–2.0)
Bicarbonate: 34.3 mmol/L — ABNORMAL HIGH (ref 20.0–28.0)
O2 Saturation: 88.6 %
Patient temperature: 37.1
pCO2, Ven: 45 mmHg (ref 44–60)
pH, Ven: 7.49 — ABNORMAL HIGH (ref 7.25–7.43)
pO2, Ven: 53 mmHg — ABNORMAL HIGH (ref 32–45)

## 2023-04-02 LAB — MAGNESIUM: Magnesium: 2.1 mg/dL (ref 1.7–2.4)

## 2023-04-02 LAB — BRAIN NATRIURETIC PEPTIDE: B Natriuretic Peptide: 99.7 pg/mL (ref 0.0–100.0)

## 2023-04-02 MED ORDER — POLYETHYLENE GLYCOL 3350 17 G PO PACK
17.0000 g | PACK | Freq: Every day | ORAL | Status: DC
  Administered 2023-04-02 – 2023-04-05 (×4): 17 g via ORAL
  Filled 2023-04-02 (×4): qty 1

## 2023-04-02 MED ORDER — IPRATROPIUM-ALBUTEROL 0.5-2.5 (3) MG/3ML IN SOLN
3.0000 mL | Freq: Three times a day (TID) | RESPIRATORY_TRACT | Status: AC
  Administered 2023-04-02 – 2023-04-04 (×6): 3 mL via RESPIRATORY_TRACT
  Filled 2023-04-02 (×6): qty 3

## 2023-04-02 MED ORDER — POTASSIUM CHLORIDE 20 MEQ PO PACK
40.0000 meq | PACK | Freq: Once | ORAL | Status: AC
Start: 2023-04-02 — End: 2023-04-02
  Administered 2023-04-02: 40 meq via ORAL
  Filled 2023-04-02: qty 2

## 2023-04-02 MED ORDER — LACTULOSE 10 GM/15ML PO SOLN
30.0000 g | Freq: Three times a day (TID) | ORAL | Status: AC
Start: 2023-04-02 — End: 2023-04-04
  Administered 2023-04-03 (×2): 30 g via ORAL
  Filled 2023-04-02 (×3): qty 60

## 2023-04-02 MED ORDER — LIDOCAINE 5 % EX PTCH
1.0000 | MEDICATED_PATCH | CUTANEOUS | Status: DC
  Administered 2023-04-02 – 2023-04-05 (×4): 1 via TRANSDERMAL
  Filled 2023-04-02 (×4): qty 1

## 2023-04-02 MED ORDER — FUROSEMIDE 10 MG/ML IJ SOLN
40.0000 mg | Freq: Every day | INTRAMUSCULAR | Status: DC
Start: 2023-04-03 — End: 2023-04-03
  Administered 2023-04-03: 40 mg via INTRAVENOUS
  Filled 2023-04-02: qty 4

## 2023-04-02 MED ORDER — BISACODYL 10 MG RE SUPP
10.0000 mg | Freq: Once | RECTAL | Status: AC
  Administered 2023-04-02: 10 mg via RECTAL
  Filled 2023-04-02: qty 1

## 2023-04-02 NOTE — Hospital Course (Signed)
 BLE with skin pigmenttion.  BUE elbow down scatter echymosis.  Some wheezinng

## 2023-04-02 NOTE — Telephone Encounter (Signed)
 FYI

## 2023-04-02 NOTE — Progress Notes (Signed)
 PROGRESS NOTE    [name] [name]  ZOX:[id number] DOB: 02/26/1945 DOA: 03/29/2023 PCP: [name], MD  Chief Complaint  Patient presents with   Respiratory Distress    Hospital Course:  [name] [name] is 78 y.o. male with COPD, chronic hypoxic respiratory failure, hypertension, hyperlipidemia, anxiety, chronic heart failure with preserved EF, alcohol dependence, recent fall with subsequent left rib fractures who presents from home with shortness of breath.  On EMS arrival he was on his normal 3 L O2 but saturating into the 70s.  He received IV Solu-Medrol, magnesium, Atrovent, albuterol, and was placed on nonrebreather.  Upon arrival to the ED he was afebrile and still saturating in the 70s with nonrebreather.  He was also tachypneic and tachycardic.  Chest x-ray revealed opacity at left base most consistent with pleural effusion.  He was started on BiPAP and continuous albuterol and admitted for further monitoring in the progressive unit. He was able to wean to 4L quickly and improved with IV diuresis.  Subjective: Breathing is improved this AM.  Walked down the hall. Some shortness of breath with this. Felt weak.    Objective: Vitals:   04/01/23 2127 04/02/23 0011 04/02/23 0448 04/02/23 0754  BP:  114/68 129/65   Pulse:  85 77   Resp:  20 18   Temp:  98.3 F (36.8 C) 97.8 F (36.6 C)   TempSrc:      SpO2: 92% 98% 96% 96%  Weight:      Height:        Intake/Output Summary (Last 24 hours) at 04/02/2023 0957 Last data filed at 04/01/2023 1020 Gross per 24 hour  Intake --  Output 400 ml  Net -400 ml   Filed Weights   03/29/23 1748  Weight: 94.9 kg    Examination:  Constitutional: In no distress.  Cardiovascular: Normal rate, regular rhythm. No lower extremity edema  Pulmonary: Non labored breathing on 5L [place]. Diffuse wheezing.  Abdominal: Soft. Normal bowel sounds. Non distended and non tender Musculoskeletal: Normal range of motion.     Neurological:  Alert and oriented to person, place, and time. Non focal  Skin: Skin is warm and dry.   Assessment & Plan:  Principal Problem:   COPD with acute exacerbation (HCC) Active Problems:   Acute on chronic respiratory failure with hypoxia (HCC)   Essential hypertension   Generalized anxiety disorder with panic attacks   Rib fracture   Chronic heart failure with preserved ejection fraction (HFpEF) (HCC)    COPD exacerbation Acute on chronic hypoxic respiratory failure - At baseline patient requires 3 L O2 - Currently requiring 5 L O2, continue wean as tolerated - Continue prednisone 40mg  BID, wean if wheezing improves  - Continue scheduled nebs - Initially on ceftriaxone, procal <1, doubt pna. No WBC or fever. Hold on further antibiotics  -- CXR reveals persistent left sided effusion. -- PT/OT   Heart failure preserved EF Left Pleural Effusion - Most recent echo June 2024 shows preserved EF and grade 1 diastolic dysfunction - Nearing euvolemia, weights not recorded --Transition to IV lasix 40mg  daily  - Strict I's and O's,   Rib fractures - Recently hospitalized and discharged 3/16 with new rib fractures - Still having pain with deep inspiration, cough, movement.  Continue pain control - Encouraged incentive spirometry and flutter valve.  Hypertension -Home meds have been resumed, continue to titrate as needed  Hyperlipidemia - Continue statin  Alcohol dependence - Last drink: 4/20 - Continue CIWA protocol, no  evidence of withdrawal at this time - Supplemental vitamins  Anxiety -Intermittently struggling with anxiety attacks while admitted. - Xanax and Atarax available consistent with his old meds  Rheumatoid arthritis - On methotrexate at home. Injection once a week. Voltaren gel.   Constipation - Chronic. worsening secondary to opioids from rib fracture pain - Had two bowel movements  --Continue lactulose for four more doses   DVT prophylaxis: Lovenox   Code  Status: Full Code Family Communication:  Discussed directly with patient Disposition: Inpatient, still hospitalized for supplemental O2, IV diuresis, and deconditioning beyond his baseline.  Will eventually discharge home, likely with home health.  Already has home O2.   Consultants:  None  Procedures:  None   Antimicrobials:  Anti-infectives (From admission, onward)    Start     Dose/Rate Route Frequency Ordered Stop   03/29/23 2200  cefTRIAXone (ROCEPHIN) 1 g in sodium chloride 0.9 % 100 mL IVPB  Status:  Discontinued        1 g 200 mL/hr over 30 Minutes Intravenous Every 24 hours 03/29/23 2010 03/30/23 1448       Data Reviewed: I have personally reviewed following labs and imaging studies CBC: Recent Labs  Lab 03/29/23 1742 03/30/23 0237 03/31/23 0241 04/01/23 0246 04/02/23 0420  WBC 9.1 7.7 8.3 9.2 8.3  NEUTROABS 6.8  --  7.6 7.1 6.9  HGB 12.7* 11.4* 11.1* 12.0* 12.3*  HCT 38.6* 34.7* 34.0* 35.9* 37.1*  MCV 109.7* 109.5* 110.4* 110.5* 109.4*  PLT 183 159 152 188 202   Basic Metabolic Panel: Recent Labs  Lab 03/29/23 1742 03/30/23 0237 03/31/23 0241 04/01/23 0246 04/02/23 0420  NA 133* 133* 136 138 136  K 3.8 3.5 3.9 3.4* 3.6  CL 94* 92* 96* 94* 94*  CO2 31 30 31  33* 32  GLUCOSE 117* 190* 184* 146* 146*  BUN 10 12 16 18 17   CREATININE 0.54* 0.60* 0.60* 0.78 0.50*  CALCIUM 8.2* 8.6* 9.0 9.0 9.0  MG  --   --  2.1 2.1 2.1  PHOS  --   --  3.0 3.9 4.4   GFR: Estimated Creatinine Clearance: 93.8 mL/min (A) (by C-G formula based on SCr of 0.5 mg/dL (L)). Liver Function Tests: Recent Labs  Lab 03/31/23 0241 04/01/23 0246 04/02/23 0420  AST 21 31 27   ALT 29 35 37  ALKPHOS 92 90 97  BILITOT 0.3 0.6 0.6  PROT 6.4* 6.3* 6.3*  ALBUMIN 3.0* 3.0* 3.1*   CBG: No results for input(s): "GLUCAP" in the last 168 hours.  Recent Results (from the past 240 hours)  MRSA Next Gen by PCR, Nasal     Status: None   Collection Time: 03/30/23  1:53 AM   Specimen:  Nasal Mucosa; Nasal Swab  Result Value Ref Range Status   MRSA by PCR Next Gen NOT DETECTED NOT DETECTED Final    Comment: (NOTE) The GeneXpert MRSA Assay (FDA approved for NASAL specimens only), is one component of a comprehensive MRSA colonization surveillance program. It is not intended to diagnose MRSA infection nor to guide or monitor treatment for MRSA infections. Test performance is not FDA approved in patients less than 15 years old. Performed at [facility], [address], [place], [place] [address]      Radiology Studies: No results found.   Scheduled Meds:  amLODipine  5 mg Oral Daily   atorvastatin  20 mg Oral Daily   Chlorhexidine Gluconate Cloth  6 each Topical Daily   enoxaparin (  LOVENOX) injection  40 mg Subcutaneous Q24H   feeding supplement  237 mL Oral BID BM   folic acid  1 mg Oral Daily   furosemide  40 mg Intravenous BID   ipratropium-albuterol  3 mL Nebulization TID   lactulose  30 g Oral TID   mometasone-formoterol  2 puff Inhalation BID   And   umeclidinium bromide  1 puff Inhalation Daily   multivitamin with minerals  1 tablet Oral Daily   mouth rinse  15 mL Mouth Rinse 4 times per day   predniSONE  40 mg Oral BID WC   sertraline  100 mg Oral Daily   sodium chloride flush  3 mL Intravenous Q12H   thiamine  100 mg Oral Daily   Or   thiamine  100 mg Intravenous Daily   Continuous Infusions:     LOS: 4 days  MDM: Patient is high risk for one or more organ failure.  They necessitate ongoing hospitalization for continued IV therapies and subsequent lab monitoring. Total time spent interpreting labs and vitals, coordinating care amongst consultants and care team members, directly assessing and discussing care with the patient and/or family: 35 min   Marolyn Haller, MD [facility]  To contact the attending physician between 7A-7P please use Epic Chat. To contact the covering physician during after hours 7P-7A, please  review Amion.   04/02/2023, 9:57 AM   *This document has been created with the assistance of dictation software. Please excuse typographical errors. *

## 2023-04-02 NOTE — Telephone Encounter (Signed)
 PT is in hospital and wanted Dr. Vassie Loll to know. [facility]. COPD complications.

## 2023-04-02 NOTE — Progress Notes (Signed)
 Pt seen and given his scheduled neb treatment which he tolerated well.  HR86, RR18,SPO2 94% on 5L nasal cannula.  No increased wob/ respiratory distress noted or voiced by patient.  Bipap not indicated at this time.

## 2023-04-03 DIAGNOSIS — J441 Chronic obstructive pulmonary disease with (acute) exacerbation: Secondary | ICD-10-CM | POA: Diagnosis not present

## 2023-04-03 LAB — CBC WITH DIFFERENTIAL/PLATELET
Abs Immature Granulocytes: 0.08 10*3/uL — ABNORMAL HIGH (ref 0.00–0.07)
Basophils Absolute: 0 10*3/uL (ref 0.0–0.1)
Basophils Relative: 0 %
Eosinophils Absolute: 0 10*3/uL (ref 0.0–0.5)
Eosinophils Relative: 0 %
HCT: 37.7 % — ABNORMAL LOW (ref 39.0–52.0)
Hemoglobin: 12.6 g/dL — ABNORMAL LOW (ref 13.0–17.0)
Immature Granulocytes: 1 %
Lymphocytes Relative: 7 %
Lymphs Abs: 0.6 10*3/uL — ABNORMAL LOW (ref 0.7–4.0)
MCH: 35.9 pg — ABNORMAL HIGH (ref 26.0–34.0)
MCHC: 33.4 g/dL (ref 30.0–36.0)
MCV: 107.4 fL — ABNORMAL HIGH (ref 80.0–100.0)
Monocytes Absolute: 0.6 10*3/uL (ref 0.1–1.0)
Monocytes Relative: 8 %
Neutro Abs: 6.3 10*3/uL (ref 1.7–7.7)
Neutrophils Relative %: 84 %
Platelets: 220 10*3/uL (ref 150–400)
RBC: 3.51 MIL/uL — ABNORMAL LOW (ref 4.22–5.81)
RDW: 15.2 % (ref 11.5–15.5)
WBC: 7.6 10*3/uL (ref 4.0–10.5)
nRBC: 0 % (ref 0.0–0.2)

## 2023-04-03 LAB — VITAMIN B12: Vitamin B-12: 961 pg/mL — ABNORMAL HIGH (ref 180–914)

## 2023-04-03 LAB — MAGNESIUM: Magnesium: 2.3 mg/dL (ref 1.7–2.4)

## 2023-04-03 LAB — IRON AND TIBC
Iron: 50 ug/dL (ref 45–182)
Saturation Ratios: 18 % (ref 17.9–39.5)
TIBC: 283 ug/dL (ref 250–450)
UIBC: 233 ug/dL

## 2023-04-03 LAB — COMPREHENSIVE METABOLIC PANEL
ALT: 33 U/L (ref 0–44)
AST: 22 U/L (ref 15–41)
Albumin: 3 g/dL — ABNORMAL LOW (ref 3.5–5.0)
Alkaline Phosphatase: 95 U/L (ref 38–126)
Anion gap: 10 (ref 5–15)
BUN: 21 mg/dL (ref 8–23)
CO2: 33 mmol/L — ABNORMAL HIGH (ref 22–32)
Calcium: 9 mg/dL (ref 8.9–10.3)
Chloride: 91 mmol/L — ABNORMAL LOW (ref 98–111)
Creatinine, Ser: 0.65 mg/dL (ref 0.61–1.24)
GFR, Estimated: >60 mL/min (ref >60)
Glucose, Bld: 172 mg/dL — ABNORMAL HIGH (ref 70–99)
Potassium: 4.2 mmol/L (ref 3.5–5.1)
Sodium: 134 mmol/L — ABNORMAL LOW (ref 135–145)
Total Bilirubin: 0.4 mg/dL (ref 0.0–1.2)
Total Protein: 6.6 g/dL (ref 6.5–8.1)

## 2023-04-03 LAB — FOLATE: Folate: 10.8 ng/mL (ref >5.9)

## 2023-04-03 LAB — FERRITIN: Ferritin: 114 ng/mL (ref 24–336)

## 2023-04-03 LAB — PHOSPHORUS: Phosphorus: 3.9 mg/dL (ref 2.5–4.6)

## 2023-04-03 MED ORDER — FUROSEMIDE 40 MG PO TABS
40.0000 mg | ORAL_TABLET | Freq: Every day | ORAL | Status: DC
  Administered 2023-04-04 – 2023-04-06 (×3): 40 mg via ORAL
  Filled 2023-04-03 (×3): qty 1

## 2023-04-03 MED ORDER — DOCUSATE SODIUM 100 MG PO CAPS
100.0000 mg | ORAL_CAPSULE | Freq: Two times a day (BID) | ORAL | Status: DC
  Administered 2023-04-03 – 2023-04-05 (×5): 100 mg via ORAL
  Filled 2023-04-03 (×4): qty 1

## 2023-04-03 NOTE — Progress Notes (Signed)
 Physical Therapy Treatment Patient Details Name: [name] [name] MRN: [id number] DOB: 01/10/1945 Today's Date: 04/03/2023   History of Present Illness 78 y.o. male who presents from home with shortness of breath. Dx of COPD exacerbation. Pt with h/o COPD, chronic hypoxic respiratory failure, hypertension, hyperlipidemia, anxiety, chronic heart failure with preserved EF, alcohol dependence, recent fall with subsequent left rib fractures 03/20/23, recent admission 3/14-3/16/25.    PT Comments  Pt received sitting up in chair on 4L O2, SpO2 at rest 93%. CGA to stand at [facility]. Gait training in hall ~74ft with RW, CG/S with SpO2 dropping to 89-90% and returning above 90% after standing rest break and PLB technique. Pt returned to room, issued a new non-broken acapella/flutter, set at #3 resistance and educated on proper use with good teach back. Pt is slowly improving, will continue to progress acutely.     If plan is discharge home, recommend the following: A little help with walking and/or transfers;A little help with bathing/dressing/bathroom;Assistance with cooking/housework;Assist for transportation   Can travel by private vehicle        Equipment Recommendations  None recommended by PT    Recommendations for Other Services       Precautions / Restrictions Precautions Precautions: Fall;Other (comment) Recall of Precautions/Restrictions: Intact Precaution/Restrictions Comments: monitor O2 Restrictions Weight Bearing Restrictions Per Provider Order: No     Mobility  Bed Mobility               General bed mobility comments: N/T pt in chair pre/post session    Transfers Overall transfer level: Needs assistance Equipment used: Rolling walker (2 wheels) Transfers: Sit to/from Stand Sit to Stand: Contact guard assist           General transfer comment: VCs for hand placement    Ambulation/Gait Ambulation/Gait assistance: Contact guard assist, Supervision Gait  Distance (Feet): 45 Feet Assistive device: Rolling walker (2 wheels) Gait Pattern/deviations: Step-through pattern Gait velocity: decreased     General Gait Details: distance limited due to dyspnea, SpO2 dropped to 89% on 4L O2 while walking, VCs for pursed lip breathing. able to recover above 90% with standing rest break   Stairs             Wheelchair Mobility     Tilt Bed    Modified Rankin (Stroke Patients Only)       Balance Overall balance assessment: Modified Independent Sitting-balance support: No upper extremity supported, Feet supported Sitting balance-Leahy Scale: Good     Standing balance support: During functional activity, Reliant on assistive device for balance, Bilateral upper extremity supported Standing balance-Leahy Scale: Fair                              Hotel manager: No apparent difficulties  Cognition Arousal: Alert Behavior During Therapy: WFL for tasks assessed/performed   PT - Cognitive impairments: No apparent impairments                       PT - Cognition Comments: Pleasant and cooperative Following commands: Intact      Cueing Cueing Techniques: Verbal cues  Exercises Other Exercises Other Exercises: Pt issued a new acapella/flutter and educated on proper use. Resistance placed at 3 with good teach back demonstration. Other Exercises: Reviewed seated B LE strengthening exercises with good understanding    General Comments General comments (skin integrity, edema, etc.): Pt on 4L resting in chair, SpO2 93%, dropping  to 89-90% during gait training      Pertinent Vitals/Pain Pain Assessment Pain Assessment: No/denies pain    Home Living                          Prior Function            PT Goals (current goals can now be found in the care plan section) Acute Rehab PT Goals Patient Stated Goal: walk, decrease pain Progress towards PT goals: Progressing toward  goals    Frequency    Min 3X/week      PT Plan      Co-evaluation              AM-PAC PT "6 Clicks" Mobility   Outcome Measure  Help needed turning from your back to your side while in a flat bed without using bedrails?: A Little Help needed moving from lying on your back to sitting on the side of a flat bed without using bedrails?: A Lot Help needed moving to and from a bed to a chair (including a wheelchair)?: A Little Help needed standing up from a chair using your arms (e.g., wheelchair or bedside chair)?: A Little Help needed to walk in hospital room?: A Little Help needed climbing 3-5 steps with a railing? : A Little 6 Click Score: 17    End of Session Equipment Utilized During Treatment: Oxygen;Gait belt Activity Tolerance: Patient tolerated treatment well Patient left: in chair;with call bell/phone within reach Nurse Communication: Mobility status PT Visit Diagnosis: Unsteadiness on feet (R26.81);Other abnormalities of gait and mobility (R26.89);History of falling (Z91.81);Muscle weakness (generalized) (M62.81);Difficulty in walking, not elsewhere classified (R26.2)     Time: 4098-1191 PT Time Calculation (min) (ACUTE ONLY): 26 min  Charges:    $Gait Training: 8-22 mins $Therapeutic Exercise: 8-22 mins PT General Charges $$ ACUTE PT VISIT: 1 Visit                    Zadie Cleverly, PTA  Jannet Askew 04/03/2023, 2:59 PM

## 2023-04-03 NOTE — Telephone Encounter (Signed)
 NFN

## 2023-04-03 NOTE — Progress Notes (Signed)
 PROGRESS NOTE    [name] [name]  DGU:[id number] DOB: 04-17-1945 DOA: 03/29/2023 PCP: [name], MD  Chief Complaint  Patient presents with   Respiratory Distress    Hospital Course:  [name] [name] is 78 y.o. male with COPD, chronic hypoxic respiratory failure, hypertension, hyperlipidemia, anxiety, chronic heart failure with preserved EF, alcohol dependence, recent fall with subsequent left rib fractures who presents from home with shortness of breath.  On EMS arrival he was on his normal 3 L O2 but saturating into the 70s.  He received IV Solu-Medrol, magnesium, Atrovent, albuterol, and was placed on nonrebreather.  Upon arrival to the ED he was afebrile and still saturating in the 70s with nonrebreather.  He was also tachypneic and tachycardic.  Chest x-ray revealed opacity at left base most consistent with pleural effusion.  He was started on BiPAP and continuous albuterol and admitted for further monitoring in the progressive unit. He was able to wean to 4L quickly and improved with IV diuresis.  Subjective: First encounter with patient.  Pt expressed concern about being too weak to return home alone. Wants to discuss discharge plans with TOC  Also c/o constipation Reports overall breathing improved    Objective: Vitals:   04/03/23 0624 04/03/23 0842 04/03/23 1240 04/03/23 1404  BP:   (!) 147/58   Pulse:  71 83 77  Resp:  18 19 19   Temp:   97.9 F (36.6 C)   TempSrc:   Oral   SpO2:  98% 97% 98%  Weight: 92.8 kg     Height:        Intake/Output Summary (Last 24 hours) at 04/03/2023 1800 Last data filed at 04/03/2023 1300 Gross per 24 hour  Intake 840 ml  Output 3600 ml  Net -2760 ml   Filed Weights   03/29/23 1748 04/03/23 0624  Weight: 94.9 kg 92.8 kg    Examination:  Physical Exam Constitutional:      General: He is not in acute distress.    Appearance: He is not toxic-appearing.  HENT:     Head: Normocephalic and atraumatic.      Mouth/Throat:     Mouth: Mucous membranes are moist.  Eyes:     Extraocular Movements: Extraocular movements intact.     Pupils: Pupils are equal, round, and reactive to light.  Cardiovascular:     Rate and Rhythm: Normal rate and regular rhythm.  Pulmonary:     Effort: Pulmonary effort is normal. No respiratory distress.  Abdominal:     General: There is no distension.     Palpations: Abdomen is soft.     Tenderness: There is no abdominal tenderness.  Musculoskeletal:     Cervical back: Neck supple.  Skin:    General: Skin is warm and dry.  Neurological:     Mental Status: He is alert. Mental status is at baseline.  Psychiatric:        Mood and Affect: Mood normal.        Behavior: Behavior normal.      Assessment & Plan:  Principal Problem:   COPD with acute exacerbation (HCC) Active Problems:   Acute on chronic respiratory failure with hypoxia (HCC)   Essential hypertension   Generalized anxiety disorder with panic attacks   Rib fracture   Chronic heart failure with preserved ejection fraction (HFpEF) (HCC)    COPD exacerbation Acute on chronic hypoxic respiratory failure - At baseline patient requires 3 L O2 - Currently requiring 4 L O2, continue  wean as tolerated - Continue prednisone taper - Continue scheduled nebs - Initially on ceftriaxone, procal <1, doubt pna. No WBC or fever. Hold on further antibiotics  -- PT/OT   Heart failure preserved EF Left Pleural Effusion - Most recent echo June 2024 shows preserved EF and grade 1 diastolic dysfunction - Nearing euvolemia, weights not recorded --Transition to PO lasix today - Strict I's and O's,   Rib fractures - Recently hospitalized and discharged 3/16 with new rib fractures.  - pain tolerable on current regimen  - Encouraged incentive spirometry and flutter valve.  Hypertension -Home meds have been resumed, continue to titrate as needed  Hyperlipidemia - Continue statin  Alcohol dependence - Last  drink: 4/20 - Continue CIWA protocol, no evidence of withdrawal at this time - Supplemental vitamins  Anxiety -Intermittently struggling with anxiety attacks while admitted. - Xanax and Atarax available consistent with his old meds  Rheumatoid arthritis - On methotrexate at home. Injection once a week, missed injection last Sunday. On prednisone and appears stable.  Voltaren gel.   Constipation - Chronic. worsening secondary to opioids from rib fracture pain. Abdominal exam benign  --Continue bowel regimen.    DVT prophylaxis: Lovenox   Code Status: Full Code Family Communication:  Discussed directly with patient Disposition: Inpatient, still hospitalized for supplemental O2, IV diuresis, and deconditioning beyond his baseline.  Will eventually discharge home, likely with home health.  Already has home O2.   Consultants:  None  Procedures:  None   Antimicrobials:  Anti-infectives (From admission, onward)    Start     Dose/Rate Route Frequency Ordered Stop   03/29/23 2200  cefTRIAXone (ROCEPHIN) 1 g in sodium chloride 0.9 % 100 mL IVPB  Status:  Discontinued        1 g 200 mL/hr over 30 Minutes Intravenous Every 24 hours 03/29/23 2010 03/30/23 1448       Data Reviewed: I have personally reviewed following labs and imaging studies CBC: Recent Labs  Lab 03/29/23 1742 03/30/23 0237 03/31/23 0241 04/01/23 0246 04/02/23 0420 04/03/23 0413  WBC 9.1 7.7 8.3 9.2 8.3 7.6  NEUTROABS 6.8  --  7.6 7.1 6.9 6.3  HGB 12.7* 11.4* 11.1* 12.0* 12.3* 12.6*  HCT 38.6* 34.7* 34.0* 35.9* 37.1* 37.7*  MCV 109.7* 109.5* 110.4* 110.5* 109.4* 107.4*  PLT 183 159 152 188 202 220   Basic Metabolic Panel: Recent Labs  Lab 03/30/23 0237 03/31/23 0241 04/01/23 0246 04/02/23 0420 04/03/23 0413  NA 133* 136 138 136 134*  K 3.5 3.9 3.4* 3.6 4.2  CL 92* 96* 94* 94* 91*  CO2 30 31 33* 32 33*  GLUCOSE 190* 184* 146* 146* 172*  BUN 12 16 18 17 21   CREATININE 0.60* 0.60* 0.78 0.50* 0.65   CALCIUM 8.6* 9.0 9.0 9.0 9.0  MG  --  2.1 2.1 2.1 2.3  PHOS  --  3.0 3.9 4.4 3.9   GFR: Estimated Creatinine Clearance: 92.9 mL/min (by C-G formula based on SCr of 0.65 mg/dL). Liver Function Tests: Recent Labs  Lab 03/31/23 0241 04/01/23 0246 04/02/23 0420 04/03/23 0413  AST 21 31 27 22   ALT 29 35 37 33  ALKPHOS 92 90 97 95  BILITOT 0.3 0.6 0.6 0.4  PROT 6.4* 6.3* 6.3* 6.6  ALBUMIN 3.0* 3.0* 3.1* 3.0*   CBG: No results for input(s): "GLUCAP" in the last 168 hours.  Recent Results (from the past 240 hours)  MRSA Next Gen by PCR, Nasal     Status: None  Collection Time: 03/30/23  1:53 AM   Specimen: Nasal Mucosa; Nasal Swab  Result Value Ref Range Status   MRSA by PCR Next Gen NOT DETECTED NOT DETECTED Final    Comment: (NOTE) The GeneXpert MRSA Assay (FDA approved for NASAL specimens only), is one component of a comprehensive MRSA colonization surveillance program. It is not intended to diagnose MRSA infection nor to guide or monitor treatment for MRSA infections. Test performance is not FDA approved in patients less than 95 years old. Performed at [facility], [address], [place], [place] [address]      Radiology Studies: No results found.   Scheduled Meds:  amLODipine  5 mg Oral Daily   atorvastatin  20 mg Oral Daily   Chlorhexidine Gluconate Cloth  6 each Topical Daily   docusate sodium  100 mg Oral BID   enoxaparin (LOVENOX) injection  40 mg Subcutaneous Q24H   feeding supplement  237 mL Oral BID BM   folic acid  1 mg Oral Daily   furosemide  40 mg Intravenous Daily   ipratropium-albuterol  3 mL Nebulization TID   lactulose  30 g Oral TID   lidocaine  1 patch Transdermal Q24H   mometasone-formoterol  2 puff Inhalation BID   And   umeclidinium bromide  1 puff Inhalation Daily   multivitamin with minerals  1 tablet Oral Daily   mouth rinse  15 mL Mouth Rinse 4 times per day   polyethylene glycol  17 g Oral Daily   predniSONE   40 mg Oral BID WC   sertraline  100 mg Oral Daily   sodium chloride flush  3 mL Intravenous Q12H   thiamine  100 mg Oral Daily   Or   thiamine  100 mg Intravenous Daily   Continuous Infusions:     LOS: 5 days  MDM: Patient is high risk for one or more organ failure.  They necessitate ongoing hospitalization for continued IV therapies and subsequent lab monitoring. Total time spent interpreting labs and vitals, coordinating care amongst consultants and care team members, directly assessing and discussing care with the patient and/or family: 35 min   Loraine Leriche, MD [facility]  To contact the attending physician between 7A-7P please use Epic Chat. To contact the covering physician during after hours 7P-7A, please review Amion.   04/03/2023, 6:00 PM   *This document has been created with the assistance of dictation software. Please excuse typographical errors. *

## 2023-04-04 DIAGNOSIS — J441 Chronic obstructive pulmonary disease with (acute) exacerbation: Secondary | ICD-10-CM | POA: Diagnosis not present

## 2023-04-04 LAB — CBC WITH DIFFERENTIAL/PLATELET
Abs Immature Granulocytes: 0.08 10*3/uL — ABNORMAL HIGH (ref 0.00–0.07)
Basophils Absolute: 0 10*3/uL (ref 0.0–0.1)
Basophils Relative: 0 %
Eosinophils Absolute: 0 10*3/uL (ref 0.0–0.5)
Eosinophils Relative: 0 %
HCT: 36.6 % — ABNORMAL LOW (ref 39.0–52.0)
Hemoglobin: 12.3 g/dL — ABNORMAL LOW (ref 13.0–17.0)
Immature Granulocytes: 1 %
Lymphocytes Relative: 8 %
Lymphs Abs: 0.7 10*3/uL (ref 0.7–4.0)
MCH: 36.4 pg — ABNORMAL HIGH (ref 26.0–34.0)
MCHC: 33.6 g/dL (ref 30.0–36.0)
MCV: 108.3 fL — ABNORMAL HIGH (ref 80.0–100.0)
Monocytes Absolute: 0.8 10*3/uL (ref 0.1–1.0)
Monocytes Relative: 9 %
Neutro Abs: 7 10*3/uL (ref 1.7–7.7)
Neutrophils Relative %: 82 %
Platelets: 192 10*3/uL (ref 150–400)
RBC: 3.38 MIL/uL — ABNORMAL LOW (ref 4.22–5.81)
RDW: 15.1 % (ref 11.5–15.5)
WBC: 8.6 10*3/uL (ref 4.0–10.5)
nRBC: 0 % (ref 0.0–0.2)

## 2023-04-04 LAB — COMPREHENSIVE METABOLIC PANEL WITH GFR
ALT: 32 U/L (ref 0–44)
AST: 18 U/L (ref 15–41)
Albumin: 3 g/dL — ABNORMAL LOW (ref 3.5–5.0)
Alkaline Phosphatase: 89 U/L (ref 38–126)
Anion gap: 9 (ref 5–15)
BUN: 21 mg/dL (ref 8–23)
CO2: 33 mmol/L — ABNORMAL HIGH (ref 22–32)
Calcium: 9 mg/dL (ref 8.9–10.3)
Chloride: 93 mmol/L — ABNORMAL LOW (ref 98–111)
Creatinine, Ser: 0.66 mg/dL (ref 0.61–1.24)
GFR, Estimated: >60 mL/min (ref >60)
Glucose, Bld: 146 mg/dL — ABNORMAL HIGH (ref 70–99)
Potassium: 4.2 mmol/L (ref 3.5–5.1)
Sodium: 135 mmol/L (ref 135–145)
Total Bilirubin: 0.4 mg/dL (ref 0.0–1.2)
Total Protein: 6 g/dL — ABNORMAL LOW (ref 6.5–8.1)

## 2023-04-04 LAB — PHOSPHORUS: Phosphorus: 4.1 mg/dL (ref 2.5–4.6)

## 2023-04-04 LAB — MAGNESIUM: Magnesium: 2.1 mg/dL (ref 1.7–2.4)

## 2023-04-04 MED ORDER — SALINE SPRAY 0.65 % NA SOLN
1.0000 | NASAL | Status: DC | PRN
  Administered 2023-04-04: 1 via NASAL
  Filled 2023-04-04 (×2): qty 44

## 2023-04-04 NOTE — Plan of Care (Signed)

## 2023-04-04 NOTE — Evaluation (Signed)
 Occupational Therapy Evaluation/Discharge Patient Details Name: [name] [name] MRN: [id number] DOB: 05-Oct-1945 Today's Date: 04/04/2023   History of Present Illness   78 y.o. male who presents from home with shortness of breath. Dx of COPD exacerbation. Pt with h/o COPD, chronic hypoxic respiratory failure, hypertension, hyperlipidemia, anxiety, chronic heart failure with preserved EF, alcohol dependence, recent fall with subsequent left rib fractures 03/20/23, recent admission 3/14-3/16/25.     Clinical Impressions Patient evaluated by Occupational Therapy with no further acute OT needs identified. All education has been completed and the patient has no further questions. Pt reports that he lives at home alone normally. Daughter is staying with him temporarily. He is Mod I for ADL tasks and functional mobility.  See below for any follow-up Occupational Therapy. OT is signing off. Thank you for this referral.      If plan is discharge home, recommend the following:   Assistance with cooking/housework;Assist for transportation;Help with stairs or ramp for entrance     Functional Status Assessment   Patient has had a recent decline in their functional status and demonstrates the ability to make significant improvements in function in a reasonable and predictable amount of time.     Equipment Recommendations   None recommended by OT      Precautions/Restrictions   Precautions Precautions: Fall;Other (comment) Recall of Precautions/Restrictions: Intact Precaution/Restrictions Comments: monitor O2. On home O2 at baseline Restrictions Weight Bearing Restrictions Per Provider Order: No     Mobility Bed Mobility    General bed mobility comments: Up in recliner upon therapy arrival    Transfers Overall transfer level: Needs assistance Equipment used: Rollator (4 wheels) Transfers: Bed to chair/wheelchair/BSC, Sit to/from Stand Sit to Stand: Supervision     Step  pivot transfers: Supervision     General transfer comment: No VC required for hand placement with RW management. Pt demonstrated good safety awareness and judgement      Balance Overall balance assessment: No apparent balance deficits (not formally assessed)           ADL either performed or assessed with clinical judgement   ADL Overall ADL's : Needs assistance/impaired Eating/Feeding: Sitting;Modified independent Eating/Feeding Details (indicate cue type and reason): dentures Grooming: Supervision/safety;Standing   Upper Body Bathing: Set up;Sitting   Lower Body Bathing: Set up;Sit to/from stand   Upper Body Dressing : Set up;Sitting   Lower Body Dressing: Supervision/safety;Sit to/from stand   Toilet Transfer: Supervision/safety;Regular Toilet;Rolling walker (2 wheels);Grab bars   Toileting- Clothing Manipulation and Hygiene: Supervision/safety;Sit to/from stand    Pt education provided on compensatory techniques and energy conservation techniques while completing ADL tasks at home and for making it to the bathroom safely. Discussed use of BSC if necessary temporarily. Pt verbalized understanding.            Vision Baseline Vision/History: 1 Wears glasses Ability to See in Adequate Light: 0 Adequate Patient Visual Report: No change from baseline Vision Assessment?: Wears glasses for reading     Perception Perception: Not tested       Praxis Praxis: Not tested       Pertinent Vitals/Pain Pain Assessment Pain Assessment: Faces Faces Pain Scale: Hurts a little bit Pain Location: L ribs Pain Descriptors / Indicators: Grimacing Pain Intervention(s): Limited activity within patient's tolerance, Monitored during session, Repositioned     Extremity/Trunk Assessment Upper Extremity Assessment Upper Extremity Assessment: Overall WFL for tasks assessed   Lower Extremity Assessment Lower Extremity Assessment: Defer to PT evaluation   Cervical / Trunk  Assessment Cervical / Trunk Assessment: Kyphotic   Communication Communication Communication: No apparent difficulties   Cognition Arousal: Alert Behavior During Therapy: WFL for tasks assessed/performed Cognition: No apparent impairments     Following commands: Intact       Cueing  General Comments   Cueing Techniques: Verbal cues  Pt on 4L O2 [place] while seated in recliner with SpO2 98%. Pt completed functional mobility in hallway walking 160 feet total requiring ~2 short standing rest breaks. SpO2 remained at 92-93% while on 4L O2.           Home Living Family/patient expects to be discharged to:: Private residence Living Arrangements: Children (daughter is staying with pt temporarily) Available Help at Discharge: Family;Available PRN/intermittently (daughter works) Type of Home: Apartment Home Access: Level entry     Home Layout: One level     Bathroom Shower/Tub: Chief Strategy Officer: Handicapped height (in Estate agent)     Home Equipment: Grab bars - toilet;Grab bars - tub/shower;Cane - single Librarian, academic (2 wheels);Rollator (4 wheels);Shower seat;Hand held shower head;Adaptive equipment;BSC/3in1 Adaptive Equipment: Reacher Additional Comments: on 3L home O2; children are local and can help at times      Prior Functioning/Environment Prior Level of Function : Independent/Modified Independent;Driving             Mobility Comments: Uses SPC primarily ADLs Comments: 2x/week bathes, otherwise sponge bathes, dresses self. house keeper 1x/month, splits other cleaning with daughter    OT Problem List: Decreased strength;Decreased activity tolerance;Pain        OT Goals(Current goals can be found in the care plan section)   Acute Rehab OT Goals Patient Stated Goal: to be able to make to the bathroom at home when he needs to. OT Goal Formulation: All assessment and education complete, DC therapy   OT Frequency:   1X visit        AM-PAC OT "6 Clicks" Daily Activity     Outcome Measure Help from another person eating meals?: None Help from another person taking care of personal grooming?: A Little Help from another person toileting, which includes using toliet, bedpan, or urinal?: A Little Help from another person bathing (including washing, rinsing, drying)?: A Little Help from another person to put on and taking off regular upper body clothing?: A Little Help from another person to put on and taking off regular lower body clothing?: A Little 6 Click Score: 19   End of Session Equipment Utilized During Treatment: Gait belt;Rolling walker (2 wheels);Oxygen  Activity Tolerance: Patient tolerated treatment well Patient left: in bed;with call bell/phone within reach  OT Visit Diagnosis: Unsteadiness on feet (R26.81);Muscle weakness (generalized) (M62.81);History of falling (Z91.81)                Time: 1610-9604 OT Time Calculation (min): 27 min Charges:  OT General Charges $OT Visit: 1 Visit OT Evaluation $OT Eval Low Complexity: 1 Low OT Treatments $Therapeutic Activity: 8-22 mins  Limmie Patricia, OTR/L,CBIS  Supplemental OT - [facility] and [facility] Secure Chat Preferred    Gaspare Netzel, Charisse March 04/04/2023, 10:34 AM

## 2023-04-04 NOTE — Progress Notes (Addendum)
 PROGRESS NOTE    [name] [name]  UJW:[id number] DOB: [birth year] DOA: 03/29/2023 PCP: [name] Bussing, MD  Chief Complaint  Patient presents with   Respiratory Distress    Hospital Course:  ALEM [name] is 78 y.o. male with COPD, chronic hypoxic respiratory failure, hypertension, hyperlipidemia, anxiety, chronic heart failure with preserved EF, alcohol dependence, recent fall with subsequent left rib fractures who presents from home with shortness of breath.  On EMS arrival he was on his normal 3 L O2 but saturating into the 70s.  He received IV Solu-Medrol, magnesium, Atrovent, albuterol, and was placed on nonrebreather.  Upon arrival to the ED he was afebrile and still saturating in the 70s with nonrebreather.  He was also tachypneic and tachycardic.  Chest x-ray revealed opacity at left base most consistent with pleural effusion.  He was started on BiPAP and continuous albuterol and admitted for further monitoring in the progressive unit. He was able to wean to 4L quickly and improved with IV diuresis. Back to baseline from a respiratory standpoint, however very concerned about returning home alone without home health needs in place.   Subjective:   Pt did better with PT but wants to discuss discharge plans with TOC Also c/o constipation  breathing stable    Objective: Vitals:   04/04/23 0500 04/04/23 0936 04/04/23 1414 04/04/23 1428  BP: 131/71  (!) 92/51   Pulse: 82  79   Resp: 19  19   Temp: (!) 97.5 F (36.4 C)  97.9 F (36.6 C)   TempSrc: Oral  Oral   SpO2: 96% 96% 94% 95%  Weight:      Height:        Intake/Output Summary (Last 24 hours) at 04/04/2023 1656 Last data filed at 04/04/2023 1300 Gross per 24 hour  Intake 720 ml  Output 550 ml  Net 170 ml   Filed Weights   03/29/23 1748 04/03/23 0624  Weight: 94.9 kg 92.8 kg    Examination:  Physical Exam Constitutional:      General: He is not in acute distress.    Appearance: He is not  toxic-appearing.  HENT:     Head: Normocephalic and atraumatic.     Mouth/Throat:     Mouth: Mucous membranes are moist.  Eyes:     Extraocular Movements: Extraocular movements intact.     Pupils: Pupils are equal, round, and reactive to light.  Cardiovascular:     Rate and Rhythm: Normal rate and regular rhythm.  Pulmonary:     Effort: Pulmonary effort is normal. No respiratory distress.  Abdominal:     General: There is no distension.     Palpations: Abdomen is soft.     Tenderness: There is no abdominal tenderness.  Musculoskeletal:        General: No swelling or tenderness.     Cervical back: Neck supple.     Right lower leg: No edema.     Left lower leg: No edema.  Skin:    General: Skin is warm and dry.     Comments: Venous stasis changes b/l LE   Neurological:     Mental Status: He is alert. Mental status is at baseline.  Psychiatric:        Mood and Affect: Mood normal.        Behavior: Behavior normal.      Assessment & Plan:  Principal Problem:   COPD with acute exacerbation (HCC) Active Problems:   Acute on chronic respiratory failure with  hypoxia (HCC)   Essential hypertension   Generalized anxiety disorder with panic attacks   Rib fracture   Chronic heart failure with preserved ejection fraction (HFpEF) (HCC)    COPD exacerbation- resolved  Acute on chronic hypoxic respiratory failure- resolved  - back to baseline O2 requirement. Initially placed on bipap on admission, quickly weaned to baseline after IV diuresis (net -7L) and IV steroids. In setting of recent rib fractures - Continue prednisone taper - Continue scheduled nebs - antibiotics discontinued  - optimize pain control from rib fractures ( see below)  - may benefit from sleep study and cardiac evaluation outpatient   Heart failure preserved EF Left Pleural Effusion- resolved on most recent CXR  - Most recent echo June 2024 shows preserved EF and grade 1 diastolic dysfunction, CXR this  admission with pulmonary edema - euvolemic today.  net -7L this hospitalization  - denies CP. Consider outpatient cardiac evaluation  -- continue PO lasix.   Rib fractures - Recently hospitalized and discharged 3/16 with new rib fractures.  - pain tolerable on current regimen  - Encouraged incentive spirometry and flutter valve.  Hypertension -home amlodipine resumed. Bp low today  Hyperlipidemia - Continue statin  Alcohol dependence - Last drink: 4/20 - Continue CIWA protocol, no evidence of withdrawal at this time - Supplemental vitamins  Anxiety -Intermittently struggling with anxiety attacks while admitted. - Xanax and Atarax available consistent with his old meds  Rheumatoid arthritis - On methotrexate at home. Injection once a week, missed injection last Sunday. On prednisone and appears stable.  Voltaren gel.  - cont folic acid   Constipation - Chronic. worsening secondary to opioids from rib fracture pain. Abdominal exam benign  --Continue bowel regimen    DVT prophylaxis: Lovenox   Code Status: Full Code Family Communication:  Discussed directly with patient Disposition: Inpatient, still hospitalized for deconditioning beyond his baseline.  Will eventually discharge home, likely with home health PT, once TOC input received.  Already has home O2.   Consultants:  None  Procedures:  None   Antimicrobials:  Anti-infectives (From admission, onward)    Start     Dose/Rate Route Frequency Ordered Stop   03/29/23 2200  cefTRIAXone (ROCEPHIN) 1 g in sodium chloride 0.9 % 100 mL IVPB  Status:  Discontinued        1 g 200 mL/hr over 30 Minutes Intravenous Every 24 hours 03/29/23 2010 03/30/23 1448       Data Reviewed: I have personally reviewed following labs and imaging studies CBC: Recent Labs  Lab 03/31/23 0241 04/01/23 0246 04/02/23 0420 04/03/23 0413 04/04/23 0411  WBC 8.3 9.2 8.3 7.6 8.6  NEUTROABS 7.6 7.1 6.9 6.3 7.0  HGB 11.1* 12.0* 12.3* 12.6*  12.3*  HCT 34.0* 35.9* 37.1* 37.7* 36.6*  MCV 110.4* 110.5* 109.4* 107.4* 108.3*  PLT 152 188 202 220 192   Basic Metabolic Panel: Recent Labs  Lab 03/31/23 0241 04/01/23 0246 04/02/23 0420 04/03/23 0413 04/04/23 0411  NA 136 138 136 134* 135  K 3.9 3.4* 3.6 4.2 4.2  CL 96* 94* 94* 91* 93*  CO2 31 33* 32 33* 33*  GLUCOSE 184* 146* 146* 172* 146*  BUN 16 18 17 21 21   CREATININE 0.60* 0.78 0.50* 0.65 0.66  CALCIUM 9.0 9.0 9.0 9.0 9.0  MG 2.1 2.1 2.1 2.3 2.1  PHOS 3.0 3.9 4.4 3.9 4.1   GFR: Estimated Creatinine Clearance: 92.9 mL/min (by C-G formula based on SCr of 0.66 mg/dL). Liver Function Tests: Recent  Labs  Lab 03/31/23 0241 04/01/23 0246 04/02/23 0420 04/03/23 0413 04/04/23 0411  AST 21 31 27 22 18   ALT 29 35 37 33 32  ALKPHOS 92 90 97 95 89  BILITOT 0.3 0.6 0.6 0.4 0.4  PROT 6.4* 6.3* 6.3* 6.6 6.0*  ALBUMIN 3.0* 3.0* 3.1* 3.0* 3.0*   CBG: No results for input(s): "GLUCAP" in the last 168 hours.  Recent Results (from the past 240 hours)  MRSA Next Gen by PCR, Nasal     Status: None   Collection Time: 03/30/23  1:53 AM   Specimen: Nasal Mucosa; Nasal Swab  Result Value Ref Range Status   MRSA by PCR Next Gen NOT DETECTED NOT DETECTED Final    Comment: (NOTE) The GeneXpert MRSA Assay (FDA approved for NASAL specimens only), is one component of a comprehensive MRSA colonization surveillance program. It is not intended to diagnose MRSA infection nor to guide or monitor treatment for MRSA infections. Test performance is not FDA approved in patients less than 5 years old. Performed at [facility], [address], [place], [place] [address]      Radiology Studies: No results found.   Scheduled Meds:  amLODipine  5 mg Oral Daily   atorvastatin  20 mg Oral Daily   Chlorhexidine Gluconate Cloth  6 each Topical Daily   docusate sodium  100 mg Oral BID   enoxaparin (LOVENOX) injection  40 mg Subcutaneous Q24H   feeding supplement  237  mL Oral BID BM   folic acid  1 mg Oral Daily   furosemide  40 mg Oral Daily   lidocaine  1 patch Transdermal Q24H   mometasone-formoterol  2 puff Inhalation BID   And   umeclidinium bromide  1 puff Inhalation Daily   multivitamin with minerals  1 tablet Oral Daily   mouth rinse  15 mL Mouth Rinse 4 times per day   polyethylene glycol  17 g Oral Daily   predniSONE  40 mg Oral BID WC   sertraline  100 mg Oral Daily   sodium chloride flush  3 mL Intravenous Q12H   thiamine  100 mg Oral Daily   Or   thiamine  100 mg Intravenous Daily   Continuous Infusions:     LOS: 6 days   Total time spent interpreting labs and vitals, coordinating care amongst consultants and care team members, directly assessing and discussing care with the patient and/or family: 25 min   Loraine Leriche, MD [facility]  To contact the attending physician between 7A-7P please use Epic Chat. To contact the covering physician during after hours 7P-7A, please review Amion.   04/04/2023, 4:56 PM   *This document has been created with the assistance of dictation software. Please excuse typographical errors. *

## 2023-04-04 NOTE — Progress Notes (Signed)
 Physical Therapy Treatment Patient Details Name: [name] [name] MRN: [id number] DOB: 08-Jan-1946 Today's Date: 04/04/2023   History of Present Illness 78 y.o. male who presents from home with shortness of breath. Dx of COPD exacerbation. Pt with h/o COPD, chronic hypoxic respiratory failure, hypertension, hyperlipidemia, anxiety, chronic heart failure with preserved EF, alcohol dependence, recent fall with subsequent left rib fractures 03/20/23, recent admission 3/14-3/16/25.    PT Comments  Pt progressing well this session, incr gait distance and reports minimal DOE.  Ongoing education during mobility  for breathing, posture, self monitoring exertion; SpO2=84% on 4L with amb, recovered to  92% with brief standing rest and pursed lip breathing. Sats 90% on 3L at rest. Continue PT POC, pt will benefit from HHPT at d/c     If plan is discharge home, recommend the following: A little help with walking and/or transfers;A little help with bathing/dressing/bathroom;Assistance with cooking/housework;Assist for transportation   Can travel by private vehicle        Equipment Recommendations  None recommended by PT    Recommendations for Other Services       Precautions / Restrictions Precautions Precautions: Fall;Other (comment) Recall of Precautions/Restrictions: Intact Precaution/Restrictions Comments: monitor O2. On  4L O2 at baseline Restrictions Weight Bearing Restrictions Per Provider Order: No     Mobility  Bed Mobility               General bed mobility comments: pt in recliner    Transfers Overall transfer level: Needs assistance Equipment used: Rolling walker (2 wheels) Transfers: Sit to/from Stand Sit to Stand: Supervision           General transfer comment: No VC required for hand placement with RW management. Pt demonstrated good safety awareness and judgement; supervision for line management    Ambulation/Gait Ambulation/Gait assistance: Contact guard  assist, Supervision Gait Distance (Feet): 100 Feet Assistive device: Rolling walker (2 wheels) Gait Pattern/deviations: Step-through pattern Gait velocity: decreased     General Gait Details: cues for breathing, posture, self monitoring exertion; SpO2=84% on 4L with amb, recovered to  92% with standing rest and pursed lip breathing. Sats 90% on 3L at rest   Stairs             Wheelchair Mobility     Tilt Bed    Modified Rankin (Stroke Patients Only)       Balance   Sitting-balance support: Feet supported, No upper extremity supported Sitting balance-Leahy Scale: Good     Standing balance support: During functional activity, Reliant on assistive device for balance, Bilateral upper extremity supported Standing balance-Leahy Scale: Fair Standing balance comment: Benefits from UE support dynamically                            Communication Communication Communication: No apparent difficulties  Cognition Arousal: Alert Behavior During Therapy: WFL for tasks assessed/performed   PT - Cognitive impairments: No apparent impairments                       PT - Cognition Comments: Pleasant and cooperative Following commands: Intact      Cueing Cueing Techniques: Verbal cues  Exercises      General Comments        Pertinent Vitals/Pain Pain Assessment Pain Assessment: No/denies pain    Home Living  Prior Function            PT Goals (current goals can now be found in the care plan section) Acute Rehab PT Goals Patient Stated Goal: walk, decrease pain PT Goal Formulation: With patient Time For Goal Achievement: 04/15/23 Potential to Achieve Goals: Good Progress towards PT goals: Progressing toward goals    Frequency    Min 3X/week      PT Plan      Co-evaluation              AM-PAC PT "6 Clicks" Mobility   Outcome Measure  Help needed turning from your back to your side while  in a flat bed without using bedrails?: A Little Help needed moving from lying on your back to sitting on the side of a flat bed without using bedrails?: A Little Help needed moving to and from a bed to a chair (including a wheelchair)?: A Little Help needed standing up from a chair using your arms (e.g., wheelchair or bedside chair)?: A Little Help needed to walk in hospital room?: A Little Help needed climbing 3-5 steps with a railing? : A Little 6 Click Score: 18    End of Session Equipment Utilized During Treatment: Gait belt Activity Tolerance: Patient tolerated treatment well Patient left: with call bell/phone within reach;Other (comment) (bathroom, Rn aware)   PT Visit Diagnosis: Unsteadiness on feet (R26.81);Other abnormalities of gait and mobility (R26.89);History of falling (Z91.81);Muscle weakness (generalized) (M62.81);Difficulty in walking, not elsewhere classified (R26.2)     Time: 2440-1027 PT Time Calculation (min) (ACUTE ONLY): 19 min  Charges:    $Gait Training: 8-22 mins PT General Charges $$ ACUTE PT VISIT: 1 Visit                     Beren Yniguez, PT  Acute Rehab Dept [facility]) [phone]  04/04/2023    [facility] 04/04/2023, 4:14 PM

## 2023-04-04 NOTE — Patient Instructions (Signed)
 Marland Kitchen

## 2023-04-05 DIAGNOSIS — J441 Chronic obstructive pulmonary disease with (acute) exacerbation: Secondary | ICD-10-CM | POA: Diagnosis not present

## 2023-04-05 DIAGNOSIS — I5033 Acute on chronic diastolic (congestive) heart failure: Secondary | ICD-10-CM | POA: Diagnosis not present

## 2023-04-05 DIAGNOSIS — J9621 Acute and chronic respiratory failure with hypoxia: Secondary | ICD-10-CM | POA: Diagnosis not present

## 2023-04-05 DIAGNOSIS — K5903 Drug induced constipation: Secondary | ICD-10-CM | POA: Diagnosis not present

## 2023-04-05 LAB — BASIC METABOLIC PANEL WITH GFR
Anion gap: 9 (ref 5–15)
BUN: 20 mg/dL (ref 8–23)
CO2: 29 mmol/L (ref 22–32)
Calcium: 8.7 mg/dL — ABNORMAL LOW (ref 8.9–10.3)
Chloride: 96 mmol/L — ABNORMAL LOW (ref 98–111)
Creatinine, Ser: 0.65 mg/dL (ref 0.61–1.24)
GFR, Estimated: >60 mL/min (ref >60)
Glucose, Bld: 174 mg/dL — ABNORMAL HIGH (ref 70–99)
Potassium: 4.4 mmol/L (ref 3.5–5.1)
Sodium: 134 mmol/L — ABNORMAL LOW (ref 135–145)

## 2023-04-05 MED ORDER — POLYETHYLENE GLYCOL 3350 17 G PO PACK
17.0000 g | PACK | Freq: Two times a day (BID) | ORAL | Status: DC
Start: 2023-04-05 — End: 2023-04-06
  Administered 2023-04-05 – 2023-04-06 (×2): 17 g via ORAL
  Filled 2023-04-05 (×2): qty 1

## 2023-04-05 MED ORDER — MAGNESIUM CITRATE PO SOLN
1.0000 | Freq: Once | ORAL | Status: AC
  Administered 2023-04-05: 1 via ORAL
  Filled 2023-04-05: qty 296

## 2023-04-05 MED ORDER — SENNA 8.6 MG PO TABS
1.0000 | ORAL_TABLET | Freq: Every day | ORAL | Status: DC
  Administered 2023-04-05: 8.6 mg via ORAL
  Filled 2023-04-05: qty 1

## 2023-04-05 NOTE — Plan of Care (Signed)
  Problem: Nutrition: Goal: Adequate nutrition will be maintained Outcome: Progressing   Problem: Coping: Goal: Level of anxiety will decrease Outcome: Progressing   Problem: Elimination: Goal: Will not experience complications related to bowel motility Outcome: Progressing Goal: Will not experience complications related to urinary retention Outcome: Progressing   Problem: Pain Managment: Goal: General experience of comfort will improve and/or be controlled Outcome: Progressing   Problem: Safety: Goal: Ability to remain free from injury will improve Outcome: Progressing   Problem: Skin Integrity: Goal: Risk for impaired skin integrity will decrease Outcome: Progressing   Problem: Education: Goal: Knowledge of disease or condition will improve Outcome: Progressing Goal: Knowledge of the prescribed therapeutic regimen will improve Outcome: Progressing Goal: Individualized Educational Video(s) Outcome: Progressing   Problem: Activity: Goal: Ability to tolerate increased activity will improve Outcome: Progressing Goal: Will verbalize the importance of balancing activity with adequate rest periods Outcome: Progressing   Problem: Respiratory: Goal: Ability to maintain a clear airway will improve Outcome: Progressing Goal: Levels of oxygenation will improve Outcome: Progressing Goal: Ability to maintain adequate ventilation will improve Outcome: Progressing

## 2023-04-05 NOTE — Progress Notes (Signed)
 Progress Note   Patient: [name] [name] ZOX:[id number] DOB: Jan 14, 1945 DOA: 03/29/2023     7 DOS: the patient was seen and examined on 04/05/2023   Brief hospital course: 78 year old man PMH COPD, chronic hypoxic respiratory failure on home oxygen, chronic diastolic CHF, alcohol dependence, recent fall with left rib fractures earlier this month, presenting with increased shortness of breath.  Oxygen saturations were in the 70s on his usual 3 L and he was placed on nonrebreather.  He was started on BiPAP in the emergency department.  Admitted for acute COPD exacerbation, acute on chronic hypoxic respiratory failure.  Quickly weaned off BiPAP.  Some effusions noted treated with IV Lasix.  Anxiety seems to contribute.  Consultants None   Procedures/Events None   Assessment and Plan: COPD exacerbation- resolved  Acute on chronic hypoxic respiratory failure (4 L nasal cannula)- resolved  back to baseline O2 requirement. Initially placed on bipap on admission, quickly weaned to baseline after IV diuresis (net -7L) and IV steroids. In setting of recent rib fractures Continue prednisone taper Continue scheduled nebs. Antibiotics discontinued  May benefit from sleep study     Acute on chronic diastolic CHF Left Pleural Effusion-minimal Most recent echo June 2024 shows preserved EF and grade 1 diastolic dysfunction Treated with diuresis.  -8.9 L Suggest outpatient follow-up with cardiology.   Rib fractures Recently hospitalized and discharged 3/16 with new rib fractures.  Continue analgesia as needed.  Incentive spirometry and flutter valve.- pain tolerable on current regimen    Hypertension Stable.  Continue amlodipine.   Hyperlipidemia Continue statin   Alcohol dependence - Last drink: 4/20 - Continue CIWA protocol, no evidence of withdrawal at this time - Supplemental vitamins   Anxiety -Intermittently struggling with anxiety attacks while admitted. - Xanax and Atarax  available consistent with his old meds   Rheumatoid arthritis - On methotrexate at home. Injection once a week, missed injection last Sunday. On prednisone and appears stable.  Voltaren gel.  - cont folic acid    Constipation - Chronic. worsening secondary to opioids from rib fracture pain. Abdominal exam remains benign  -- Increase MiraLAX.  Magnesium citrate x 1.  Add senna.  Overall improving, anticipate discharge home with home health tomorrow.     Subjective:  Breathing is better.  Constipated with only a couple of small bowel movements during this hospitalization.  Has been constipated for about 2 weeks since starting pain medications for rib fractures.  Physical Exam: Vitals:   04/05/23 0244 04/05/23 0500 04/05/23 0928 04/05/23 1300  BP: (!) 119/58  132/61 108/61  Pulse: 75  75 81  Resp: 14   18  Temp: 98 F (36.7 C)     TempSrc: Oral     SpO2: 94%   100%  Weight:  92.2 kg    Height:       Physical Exam Vitals reviewed.  Constitutional:      General: He is not in acute distress.    Appearance: He is not ill-appearing or toxic-appearing.  Cardiovascular:     Rate and Rhythm: Normal rate and regular rhythm.     Heart sounds: No murmur heard. Pulmonary:     Effort: No respiratory distress.     Breath sounds: No wheezing, rhonchi or rales.     Comments: Mild increased respiratory effort Musculoskeletal:     Right lower leg: No edema.     Left lower leg: No edema.  Neurological:     Mental Status: He is alert.  Psychiatric:        Attention and Perception: Attention normal.        Mood and Affect: Mood is anxious.        Speech: Speech normal.        Behavior: Behavior normal.        Thought Content: Thought content normal.     Data Reviewed: BMP notable for glucose 174 secondary to steroids, mild hyponatremia.  Family Communication: none  Disposition: Status is: Inpatient Remains inpatient appropriate because: COPD exacerbation     Time spent:  35 minutes  Author: Brendia Sacks, MD 04/05/2023 4:07 PM  For on call review [email or web address].

## 2023-04-05 NOTE — Progress Notes (Signed)
 Mobility Specialist - Progress Note  Pre-mobility: 85 bpm HR, 96% SpO2 ([place] 3L) During mobility: 95 bpm HR, 88% SpO2 ([place] 4L) Post-mobility: 85 bpm HR, 90% SPO2 ([place] 3L)   04/05/23 1126  Mobility  Activity Ambulated with assistance in hallway  Level of Assistance Contact guard assist, steadying assist  Assistive Device Front wheel walker  Distance Ambulated (ft) 100 ft  Range of Motion/Exercises Active  Activity Response Tolerated fair  Mobility Referral Yes  Mobility visit 1 Mobility  Mobility Specialist Start Time (ACUTE ONLY) 1110  Mobility Specialist Stop Time (ACUTE ONLY) 1126  Mobility Specialist Time Calculation (min) (ACUTE ONLY) 16 min   Pt was found on recliner chair and agreeable to ambulate. Stated feeling a little lightheaded. Had x1 brief standing rest break due to SPO2 reading 88%. Able to increase within . At EOS returned to returned to recliner chair with all needs met. Call bell in reach.  Billey Chang Mobility Specialist

## 2023-04-06 DIAGNOSIS — I5033 Acute on chronic diastolic (congestive) heart failure: Secondary | ICD-10-CM | POA: Diagnosis not present

## 2023-04-06 DIAGNOSIS — S2242XA Multiple fractures of ribs, left side, initial encounter for closed fracture: Secondary | ICD-10-CM | POA: Diagnosis not present

## 2023-04-06 DIAGNOSIS — J9621 Acute and chronic respiratory failure with hypoxia: Secondary | ICD-10-CM | POA: Diagnosis not present

## 2023-04-06 DIAGNOSIS — J441 Chronic obstructive pulmonary disease with (acute) exacerbation: Secondary | ICD-10-CM | POA: Diagnosis not present

## 2023-04-06 NOTE — Plan of Care (Signed)
  Problem: Nutrition: Goal: Adequate nutrition will be maintained Outcome: Progressing   Problem: Coping: Goal: Level of anxiety will decrease Outcome: Progressing   Problem: Elimination: Goal: Will not experience complications related to urinary retention Outcome: Progressing   Problem: Pain Managment: Goal: General experience of comfort will improve and/or be controlled Outcome: Progressing   Problem: Safety: Goal: Ability to remain free from injury will improve Outcome: Progressing   Problem: Elimination: Goal: Will not experience complications related to bowel motility Outcome: Not Progressing

## 2023-04-06 NOTE — Discharge Summary (Signed)
 Physician Discharge Summary   Patient: [name] [name] MRN: [id number] DOB: 18-Jan-1945  Admit date:     03/29/2023  Discharge date: 04/06/23  Discharge Physician: Brendia Sacks   PCP: [name], MD   Recommendations at discharge:   COPD exacerbation- resolved  Acute on chronic hypoxic respiratory failure (4 L nasal cannula)- resolved  Rib fractures. Finished prednisone May benefit from sleep study     Acute on chronic diastolic CHF Left Pleural Effusion-minimal Consider outpatient follow-up with cardiology.   Discharge Diagnoses: Principal Problem:   Acute on chronic respiratory failure with hypoxia (HCC) Active Problems:   Essential hypertension   Generalized anxiety disorder with panic attacks   Constipation   Rib fracture   COPD with acute exacerbation (HCC)   Acute on chronic diastolic CHF (congestive heart failure) (HCC)  Resolved Problems:   * No resolved hospital problems. *  Hospital Course: 78 year old man PMH COPD, chronic hypoxic respiratory failure on home oxygen, chronic diastolic CHF, alcohol dependence, recent fall with left rib fractures earlier this month, presenting with increased shortness of breath.  Oxygen saturations were in the 70s on his usual 3 L and he was placed on nonrebreather.  He was started on BiPAP in the emergency department.  Admitted for acute COPD exacerbation, acute on chronic hypoxic respiratory failure.  Quickly weaned off BiPAP.  Some effusions noted treated with IV Lasix.  Anxiety seems to contribute.  Slowly improved and was discharged home in good condition.  Consultants None   Procedures/Events None   COPD exacerbation- resolved  Acute on chronic hypoxic respiratory failure (4 L nasal cannula)- resolved  Back to baseline O2 requirement. Initially placed on BiPAP on admission, quickly weaned to baseline after IV diuresis (net -7L) and IV steroids.  Complicated by recent rib fractures. Finished prednisone May  benefit from sleep study   Outpatient follow-up with pulmonology   Acute on chronic diastolic CHF Left Pleural Effusion-minimal Most recent echo June 2024 showed preserved EF and grade 1 diastolic dysfunction Treated with diuresis.  -8.9 L Consider outpatient follow-up with cardiology.   Rib fractures Recently hospitalized and discharged 3/16 with new rib fractures.  Continue analgesia as needed.  Incentive spirometry and flutter valve.- pain tolerable on current regimen    Hypertension Stable.  Continue amlodipine.   Hyperlipidemia Continue statin   Alcohol dependence - Last drink: 4/20 - Continue CIWA protocol, no evidence of withdrawal at this time - Supplemental vitamins   Anxiety -Intermittently struggling with anxiety attacks while admitted. - Xanax and Atarax available consistent with his old meds   Rheumatoid arthritis - On methotrexate at home. Injection once a week, missed injection last Sunday. Voltaren gel.  - cont folic acid    Constipation - Chronic. worsening secondary to opioids from rib fracture pain. Abdominal exam remains benign  -- Resolved.  Disposition: Home health Diet recommendation:  Regular diet DISCHARGE MEDICATION: Allergies as of 04/06/2023       Reactions   Fluticasone-salmeterol Shortness Of Breath, Other (See Comments)   "Worsening shortness of breath.."        Medication List     TAKE these medications    albuterol 108 (90 Base) MCG/ACT inhaler Commonly known as: VENTOLIN HFA Inhale 2 puffs into the lungs every 6 (six) hours as needed for wheezing or shortness of breath. What changed:  when to take this additional instructions   ALPRAZolam 0.25 MG tablet Commonly known as: XANAX Take 0.25 mg by mouth at bedtime as needed for sleep or  anxiety.   amLODipine 5 MG tablet Commonly known as: NORVASC Take 5 mg by mouth daily.   atorvastatin 20 MG tablet Commonly known as: LIPITOR Take 20 mg by mouth daily.   Breztri  Aerosphere 160-9-4.8 MCG/ACT Aero Generic drug: budeson-glycopyrrolate-formoterol Inhale 2 puffs into the lungs in the morning and at bedtime.   cyanocobalamin 1000 MCG tablet Commonly known as: VITAMIN B12 Take 1,000 mcg by mouth daily.   folic acid 1 MG tablet Commonly known as: FOLVITE Take 1 mg by mouth daily.   furosemide 40 MG tablet Commonly known as: LASIX Take 40 mg by mouth in the morning.   hydrOXYzine 25 MG tablet Commonly known as: ATARAX TAKE 1 TABLET BY MOUTH EVERY 8 HOURS AS NEEDED FOR ANXIETY. What changed: reasons to take this   methocarbamol 500 MG tablet Commonly known as: ROBAXIN Take 1 tablet (500 mg total) by mouth every 8 (eight) hours as needed for muscle spasms.   methotrexate 50 MG/2ML injection Inject 0.8 mLs into the skin once a week. sundays   multivitamin with minerals tablet Take 1 tablet by mouth daily with breakfast.   omeprazole 20 MG tablet Commonly known as: PRILOSEC OTC Take 20 mg by mouth daily.   Oxycodone HCl 10 MG Tabs Take 1 tablet (10 mg total) by mouth every 8 (eight) hours as needed for moderate pain (pain score 4-6) or severe pain (pain score 7-10) (2.5 mg for moderate, 5 mg for severe pain). What changed: Another medication with the same name was removed. Continue taking this medication, and follow the directions you see here.   OXYGEN Inhale 3-4 L/min into the lungs continuous.   polyethylene glycol 17 g packet Commonly known as: MIRALAX / GLYCOLAX Take 17 g by mouth daily as needed for mild constipation.   Potassium Chloride ER 20 MEQ Tbcr Take 20 mEq by mouth daily.   sertraline 100 MG tablet Commonly known as: ZOLOFT Take 100 mg by mouth daily.        Follow-up Information     [place] Follow up.   Why: [facility] Physical  Therapy/Home Health Aide Contact information: [address] [place] [address] [phone]         Oretha Milch,  MD Follow up.   Specialty: Pulmonary Disease Why: Office will contact you with an appointment Contact information: [address] [place] [address] [place] [address] [phone]                Discharge Exam: Ceasar Mons Weights   04/03/23 0624 04/05/23 0500 04/06/23 0440  Weight: 92.8 kg 92.2 kg 94.2 kg   Physical Exam Vitals reviewed.  Constitutional:      General: He is not in acute distress.    Appearance: He is not ill-appearing or toxic-appearing.  Cardiovascular:     Rate and Rhythm: Normal rate and regular rhythm.     Heart sounds: No murmur heard. Pulmonary:     Effort: Pulmonary effort is normal. No respiratory distress.     Breath sounds: No wheezing, rhonchi or rales.  Neurological:     Mental Status: He is alert.  Psychiatric:        Mood and Affect: Mood normal.        Behavior: Behavior normal.      Condition at discharge: good  The results of significant diagnostics from this hospitalization (including imaging, microbiology, ancillary and laboratory) are listed below for reference.  Imaging Studies: DG Chest 1 View Result Date: 03/31/2023 CLINICAL DATA:  Pleural effusion, COPD. EXAM: CHEST  1 VIEW COMPARISON:  03/29/2023 and CT chest 03/22/2023. FINDINGS: Trachea is midline. Heart is enlarged, stable. Thoracic aorta is calcified. Diffuse interstitial prominence and indistinctness superimposed on emphysema. Blunting of the left costophrenic angle may be due to lingular atelectasis/scarring. No right pleural fluid. IMPRESSION: Pulmonary edema superimposed on emphysema. Electronically Signed   By: Leanna Battles M.D.   On: 03/31/2023 09:07   DG Chest Portable 1 View Result Date: 03/29/2023 CLINICAL DATA:  Concern for left apical pneumothorax. Respiratory distress. EXAM: PORTABLE CHEST 1 VIEW COMPARISON:  03/24/2023, CT 03/22/2023 FINDINGS: The patient is significantly rotated. The patient's chin obscures the left lung apex. Allowing for this, no evidence of  pneumothorax. Persistent increased density at the left lung base, likely effusion. Chronic bronchial thickening. Heart size not well assessed on provided view. Known left rib fractures are not well demonstrated. IMPRESSION: 1. No evidence of pneumothorax. Please note current exam is limited, significantly rotated and the patient's chin obscures the left lung apex. 2. Persistent increased density at the left lung base, likely effusion. Known left rib fractures are not well seen on the current exam. Electronically Signed   By: Narda Rutherford M.D.   On: 03/29/2023 19:23   DG Chest 2 View Result Date: 03/24/2023 CLINICAL DATA:  Multiple rib fractures. EXAM: CHEST - 2 VIEW COMPARISON:  Chest and left rib radiographs 03/20/2023, AP chest 07/04/2022, CT chest 03/22/2023 FINDINGS: Cardiac silhouette is again at the upper limits of normal size. Mediastinal contours are within normal limits. Mild calcification within aortic arch. Mild to moderate chronic bilateral interstitial thickening is unchanged. Small left pleural effusion. Patient's mandible overlies the far superior left lung on frontal view. Multiple lateral left mid to lower rib fractures are noted as on prior CT. IMPRESSION: 1. Small left pleural effusion. 2. Multiple lateral left mid to lower rib fractures as on prior CT. Electronically Signed   By: Neita Garnet M.D.   On: 03/24/2023 12:41   CT Chest Wo Contrast Result Date: 03/22/2023 CLINICAL DATA:  Blunt chest Trauma EXAM: CT CHEST WITHOUT CONTRAST TECHNIQUE: Multidetector CT imaging of the chest was performed following the standard protocol without IV contrast. RADIATION DOSE REDUCTION: This exam was performed according to the departmental dose-optimization program which includes automated exposure control, adjustment of the mA and/or kV according to patient size and/or use of iterative reconstruction technique. COMPARISON:  Radiography 03/20/2023.  Chest CT 10/04/2022. FINDINGS: Cardiovascular: Heart  size upper limits of normal. No pericardial fluid. Coronary artery calcification and aortic atherosclerotic calcification are present. Mediastinum/Nodes: No mediastinal or hilar mass or lymphadenopathy. Lungs/Pleura: Upper lobe predominant emphysema and pulmonary scarring as seen previously. No evidence of active pneumonia or developing mass lesion. Chronic scarring/volume loss in the lingula. Small amount of layering pleural fluid on the left with dependent atelectasis. No pneumothorax. Upper Abdomen: Negative Musculoskeletal: Nondisplaced fracture of the left lateral sixth rib. Minimally displaced fracture of the left lateral seventh rib. Mildly displaced and overlapped fracture of the left lateral eighth rib. Minimally displaced fracture of the left ninth rib. No sternal fracture. No spinal fracture. IMPRESSION: 1. Fractures of the left lateral sixth through ninth ribs. Small amount of layering pleural fluid on the left with dependent atelectasis. No pneumothorax. 2. Upper lobe predominant emphysema and pulmonary scarring as seen previously. 3. Coronary artery calcification and aortic atherosclerotic calcification. Electronically Signed   By: Scherrie Bateman.D.  On: 03/22/2023 14:37   DG Ribs Unilateral W/Chest Left Result Date: 03/20/2023 CLINICAL DATA:  Left rib pain after fall 2 days ago. EXAM: LEFT RIBS AND CHEST - 3+ VIEW COMPARISON:  July 04, 2022. FINDINGS: Minimally displaced fracture is seen involving the left eighth rib laterally. Minimal left basilar subsegmental atelectasis is noted with small pleural effusion. Stable cardiomediastinal silhouette. IMPRESSION: Minimally displaced left eighth rib fracture. Minimal left basilar subsegmental atelectasis with small left pleural effusion. Electronically Signed   By: Lupita Raider M.D.   On: 03/20/2023 13:10    Microbiology: Results for orders placed or performed during the hospital encounter of 03/29/23  MRSA Next Gen by PCR, Nasal     Status:  None   Collection Time: 03/30/23  1:53 AM   Specimen: Nasal Mucosa; Nasal Swab  Result Value Ref Range Status   MRSA by PCR Next Gen NOT DETECTED NOT DETECTED Final    Comment: (NOTE) The GeneXpert MRSA Assay (FDA approved for NASAL specimens only), is one component of a comprehensive MRSA colonization surveillance program. It is not intended to diagnose MRSA infection nor to guide or monitor treatment for MRSA infections. Test performance is not FDA approved in patients less than 57 years old. Performed at [facility], [address] [place], [place] [address]     Labs: CBC: Recent Labs  Lab 03/31/23 0241 04/01/23 0246 04/02/23 0420 04/03/23 0413 04/04/23 0411  WBC 8.3 9.2 8.3 7.6 8.6  NEUTROABS 7.6 7.1 6.9 6.3 7.0  HGB 11.1* 12.0* 12.3* 12.6* 12.3*  HCT 34.0* 35.9* 37.1* 37.7* 36.6*  MCV 110.4* 110.5* 109.4* 107.4* 108.3*  PLT 152 188 202 220 192   Basic Metabolic Panel: Recent Labs  Lab 03/31/23 0241 04/01/23 0246 04/02/23 0420 04/03/23 0413 04/04/23 0411 04/05/23 0344  NA 136 138 136 134* 135 134*  K 3.9 3.4* 3.6 4.2 4.2 4.4  CL 96* 94* 94* 91* 93* 96*  CO2 31 33* 32 33* 33* 29  GLUCOSE 184* 146* 146* 172* 146* 174*  BUN 16 18 17 21 21 20   CREATININE 0.60* 0.78 0.50* 0.65 0.66 0.65  CALCIUM 9.0 9.0 9.0 9.0 9.0 8.7*  MG 2.1 2.1 2.1 2.3 2.1  --   PHOS 3.0 3.9 4.4 3.9 4.1  --    Liver Function Tests: Recent Labs  Lab 03/31/23 0241 04/01/23 0246 04/02/23 0420 04/03/23 0413 04/04/23 0411  AST 21 31 27 22 18   ALT 29 35 37 33 32  ALKPHOS 92 90 97 95 89  BILITOT 0.3 0.6 0.6 0.4 0.4  PROT 6.4* 6.3* 6.3* 6.6 6.0*  ALBUMIN 3.0* 3.0* 3.1* 3.0* 3.0*   CBG: No results for input(s): "GLUCAP" in the last 168 hours.  Discharge time spent: less than 30 minutes.  Signed: Brendia Sacks, MD [facility] 04/06/2023

## 2023-04-06 NOTE — Progress Notes (Signed)
 Mobility Specialist - Progress Note  Pre-mobility: 89 bpm HR, 97% SpO2 ([place] 3L) During mobility: 103 bpm HR, 88% SpO2 ([place] 4L) Post-mobility: 98 bpm HR, 94% SPO2 ([place] 3L)   04/06/23 1132  Mobility  Activity Ambulated with assistance in hallway  Level of Assistance Standby assist, set-up cues, supervision of patient - no hands on  Assistive Device Front wheel walker  Distance Ambulated (ft) 100 ft  Range of Motion/Exercises Active  Activity Response Tolerated well  Mobility Referral Yes  Mobility visit 1 Mobility  Mobility Specialist Start Time (ACUTE ONLY) 1120  Mobility Specialist Stop Time (ACUTE ONLY) 1132  Mobility Specialist Time Calculation (min) (ACUTE ONLY) 12 min   Pt was found on recliner chair and agreeable to ambulate. Stated feeling a little lightheaded with session. At EOS returned to sit on recliner chair and able to increase SPO2 >90% within seconds. Was left with all needs met and call bell in reach.  Billey Chang Mobility Specialist

## 2023-04-06 NOTE — Plan of Care (Signed)
  Problem: Nutrition: Goal: Adequate nutrition will be maintained Outcome: Adequate for Discharge   Problem: Coping: Goal: Level of anxiety will decrease Outcome: Adequate for Discharge   Problem: Elimination: Goal: Will not experience complications related to bowel motility Outcome: Adequate for Discharge Goal: Will not experience complications related to urinary retention Outcome: Adequate for Discharge   Problem: Pain Managment: Goal: General experience of comfort will improve and/or be controlled Outcome: Adequate for Discharge   Problem: Safety: Goal: Ability to remain free from injury will improve Outcome: Adequate for Discharge   Problem: Skin Integrity: Goal: Risk for impaired skin integrity will decrease Outcome: Adequate for Discharge   Problem: Education: Goal: Knowledge of disease or condition will improve Outcome: Adequate for Discharge Goal: Knowledge of the prescribed therapeutic regimen will improve Outcome: Adequate for Discharge Goal: Individualized Educational Video(s) Outcome: Adequate for Discharge   Problem: Activity: Goal: Ability to tolerate increased activity will improve Outcome: Adequate for Discharge Goal: Will verbalize the importance of balancing activity with adequate rest periods Outcome: Adequate for Discharge   Problem: Respiratory: Goal: Ability to maintain a clear airway will improve Outcome: Adequate for Discharge Goal: Levels of oxygenation will improve Outcome: Adequate for Discharge Goal: Ability to maintain adequate ventilation will improve Outcome: Adequate for Discharge

## 2023-04-09 ENCOUNTER — Telehealth: Payer: Self-pay | Admitting: Primary Care

## 2023-04-09 NOTE — Telephone Encounter (Signed)
 Copied from CRM [id number]. Topic: Clinical - Home Health Verbal Orders >> Apr 09, 2023 10:33 AM Dimitri Ped wrote: Caller/Agency: [facility] Callback Number: [phone] Service Requested: Physical Therapy Frequency: 2 week 3 1 week 4 / home health aide 1 week 7 assist with bathing  Any new concerns about the patient? No just got out hospital  Dr.Alva will it be ok for you to approve this physical therapy for patient    Dr.Alva can you please advise   Thank you

## 2023-04-09 NOTE — Telephone Encounter (Unsigned)
 Copied from CRM [id number]. Topic: Clinical - Home Health Verbal Orders >> Apr 09, 2023 10:33 AM Dimitri Ped wrote: Caller/Agency: [facility] Callback Number: [phone] Service Requested: Physical Therapy Frequency: 2 week 3 1 week 4 / home health aide 1 week 7 assist with bathing  Any new concerns about the patient? No just got out hospital

## 2023-04-09 NOTE — Telephone Encounter (Signed)
 Rec'd "Authorization Confirmation Fax Form" From [employer] for durable equip. No sigature needed. Put in Ms. Walsh's box.

## 2023-04-09 NOTE — Telephone Encounter (Signed)
 Notified.

## 2023-04-11 ENCOUNTER — Encounter (HOSPITAL_BASED_OUTPATIENT_CLINIC_OR_DEPARTMENT_OTHER): Payer: Self-pay | Admitting: Pulmonary Disease

## 2023-04-11 ENCOUNTER — Ambulatory Visit (INDEPENDENT_AMBULATORY_CARE_PROVIDER_SITE_OTHER): Admitting: Pulmonary Disease

## 2023-04-11 VITALS — BP 122/76 | HR 68 | Ht 71.0 in | Wt 202.0 lb

## 2023-04-11 DIAGNOSIS — J441 Chronic obstructive pulmonary disease with (acute) exacerbation: Secondary | ICD-10-CM

## 2023-04-11 DIAGNOSIS — J9611 Chronic respiratory failure with hypoxia: Secondary | ICD-10-CM

## 2023-04-11 NOTE — Patient Instructions (Signed)
 Use incentive spirometry  Decrease oxycodone to every 12 h Continue naproxen as needed  Increase lasix twice daily x 3 days then back down to once daily, monitor weight

## 2023-04-11 NOTE — Progress Notes (Signed)
 Subjective:    Patient ID: [name] [name], male    DOB: [birth year], 78 y.o.   MRN: [id number]  HPI  78 yo heavy ex-smoker  For FU of  COPD & chronic resp failure with hypoxia He smoked about a pack per day  until he quit in 2010- about 40 pack years. -on portable oxygen 03/2019 -uses 2 L continuous at home and  POC 3 to 4 L when he goes out -completed pulmonary rehab 2020  PMH : new diagnosis rheumatoid arthritis. Started on methotrexate 15mg  once a week   The patient, with a history of COPD and chronic respiratory failure with hypoxia, presents for follow-up after two recent hospitalizations. The first hospitalization was due to a fall resulting in rib fractures, which caused pain and shortness of breath. The patient was initially on five liters of oxygen but was weaned down to three liters. The patient was readmitted within a week due to fluid buildup and required transient BiPAP. He was diuresed about nine liters and his BNP level decreased from 136 to 99.  The patient reports that he is currently maintaining his oxygen level, especially at rest, but he still feels laborious breathing due to difficulty expanding his rib cage from the pain of the rib fractures. The pain has diminished but is still present. The patient also reports a recent onset of a dry cough and congestion, which he attributes to possible pollen exposure.  The patient also has arthritis and is on pain medication. He reports that the pain is not as bad as it was, and he is trying to decrease his oxycodone intake to twice daily. The patient also mentions that he is a heavy drinker, consuming about twelve beers per week.  Significant tests/ events reviewed   07/2020 ONO /RA -desaturation less than 88% for 23 minutes    LDCT in September 2024  showed lung RADS 2; Pulmonary nodules appear stable measuring 4.59mm or less. centrilobular and paraseptal emphysema.   HRCT chest 06/2022 no ILD CTA 06/2021 >> Upper lobe  predominant emphysema and Scarring.Multiple small calcified and noncalcified pulmonary nodules are unchanged     LDCT 07/05/20 showed lung RADS 2. Centrilobular and paraseptal emphysema , unchanged nodules compared to 2020   PFTs 09/2012 moderate reversible airway obstruction, ratio 49, FEV1 44%, improved to 56%/25% bronchodilator response, TLC 88%, DLCO 55%   Spirometry 03/18/2018 showed ratio of 61, FEV1 of 43% and FVC of 52%  Review of Systems neg for any significant sore throat, dysphagia, itching, sneezing, nasal congestion or excess/ purulent secretions, fever, chills, sweats, unintended wt loss, pleuritic or exertional cp, hempoptysis, orthopnea pnd or change in chronic leg swelling. Also denies presyncope, palpitations, heartburn, abdominal pain, nausea, vomiting, diarrhea or change in bowel or urinary habits, dysuria,hematuria, rash, arthralgias, visual complaints, headache, numbness weakness or ataxia.     Objective:   Physical Exam  Gen. Pleasant, obese, in no distress, on [facility] - no lesions, no post nasal drip Neck: No JVD, no thyromegaly, no carotid bruits Lungs: no use of accessory muscles, no dullness to percussion, decreased without rales or rhonchi  Cardiovascular: Rhythm regular, heart sounds  normal, no murmurs or gallops, 1+ peripheral edema Musculoskeletal: No deformities, no cyanosis or clubbing , no tremors        Assessment & Plan:     Chronic Obstructive Pulmonary Disease (COPD) with chronic respiratory failure COPD with chronic respiratory failure requiring supplemental oxygen. Hospitalized twice in March due to exacerbations, initially requiring five  liters of oxygen and later BiPAP support. Diuresed approximately nine liters of fluid, indicating fluid overload. Currently on three liters of oxygen at rest and four liters with activity, maintaining oxygen saturation in the low 90s. Reports some improvement but still experiences dyspnea and occasional  desaturation with exertion. Home health services to start with physical therapy twice a week. - Continue supplemental oxygen therapy at three liters at rest and four liters with activity - Encourage use of spirometry three to four times daily - Continue Breztri and albuterol nebulizer treatments - Monitor weight daily to assess fluid status - Increase Lasix to twice daily for two to three days if swelling persists, then return to once daily -Reassess in 6 weeks  Rib fractures, causing splinting Sustained fractures to the left sixth to ninth ribs following a fall, leading to significant pain and difficulty breathing. Pain was initially severe but has since diminished. Currently managing pain with oxycodone and naproxen. Advised that rib fractures typically take four to six weeks to heal. - Continue oxycodone as needed, aiming to reduce to twice daily - Use naproxen for additional pain control - Consider adding Tylenol for pain management  Alcohol use Consumes approximately twelve beers per week. Continued alcohol use may contribute to fluid retention and complicate management of COPD and fluid status. Discussed the impact of alcohol on fluid retention and overall health. - Encourage reduction in alcohol consumption

## 2023-04-12 DIAGNOSIS — J449 Chronic obstructive pulmonary disease, unspecified: Secondary | ICD-10-CM | POA: Diagnosis not present

## 2023-04-30 DIAGNOSIS — J439 Emphysema, unspecified: Secondary | ICD-10-CM | POA: Diagnosis not present

## 2023-04-30 DIAGNOSIS — J449 Chronic obstructive pulmonary disease, unspecified: Secondary | ICD-10-CM | POA: Diagnosis not present

## 2023-04-30 DIAGNOSIS — J9611 Chronic respiratory failure with hypoxia: Secondary | ICD-10-CM | POA: Diagnosis not present

## 2023-05-06 DIAGNOSIS — J9611 Chronic respiratory failure with hypoxia: Secondary | ICD-10-CM | POA: Diagnosis not present

## 2023-05-06 DIAGNOSIS — F1010 Alcohol abuse, uncomplicated: Secondary | ICD-10-CM | POA: Diagnosis not present

## 2023-05-06 DIAGNOSIS — F419 Anxiety disorder, unspecified: Secondary | ICD-10-CM | POA: Diagnosis not present

## 2023-05-06 DIAGNOSIS — Z79899 Other long term (current) drug therapy: Secondary | ICD-10-CM | POA: Diagnosis not present

## 2023-05-12 DIAGNOSIS — J449 Chronic obstructive pulmonary disease, unspecified: Secondary | ICD-10-CM | POA: Diagnosis not present

## 2023-05-15 ENCOUNTER — Ambulatory Visit: Attending: Cardiovascular Disease | Admitting: Cardiovascular Disease

## 2023-05-15 ENCOUNTER — Encounter: Payer: Self-pay | Admitting: Cardiovascular Disease

## 2023-05-15 VITALS — BP 118/54 | HR 112 | Ht 71.0 in | Wt 202.0 lb

## 2023-05-15 DIAGNOSIS — I2510 Atherosclerotic heart disease of native coronary artery without angina pectoris: Secondary | ICD-10-CM | POA: Diagnosis not present

## 2023-05-15 DIAGNOSIS — I10 Essential (primary) hypertension: Secondary | ICD-10-CM

## 2023-05-15 DIAGNOSIS — J42 Unspecified chronic bronchitis: Secondary | ICD-10-CM | POA: Diagnosis not present

## 2023-05-15 DIAGNOSIS — E782 Mixed hyperlipidemia: Secondary | ICD-10-CM | POA: Diagnosis not present

## 2023-05-15 DIAGNOSIS — J9621 Acute and chronic respiratory failure with hypoxia: Secondary | ICD-10-CM | POA: Diagnosis not present

## 2023-05-15 NOTE — Assessment & Plan Note (Signed)
 History of hyperlipidemia on statin therapy with lipid profile performed 12/26/2022 revealing total cholesterol 132, LDL 40 and HDL 81.

## 2023-05-15 NOTE — Progress Notes (Signed)
 05/15/2023 [name] [name]   1945/02/07  [id number]  Primary Physician Lanae Pinal, MD Primary Cardiologist: Avanell Leigh MD Bennye Bravo, [place]  HPI:  [name] [name] is a 78 y.o.  married Caucasian male father of 2 children, with no grandchildren, who is retired from working at [employer].  He was referred by Dr.[name], his PCP, because of diffuse coronary calcification seen on recent screening chest CT performed 03/06/2022.  I last saw him in the office 10/18/2020.  His risk factors include 80-pack-year tobacco abuse having quit 12 years ago, treated hypertension and hyperlipidemia.  There is no family history of heart disease.  He is never had a heart attack or stroke.  He denies chest pain but does get short of breath but has COPD as well.  Recent chest CT done for surveillance 07/03/2019 revealed diffuse calcification in all 3 coronary arteries.   He had a 2D echo which was essentially normal with an EF of 65 to 70% and no valvular abnormalities.  He had a coronary CTA revealing coronary calcium  score of 978 with a left dominant system and what appeared to be hemodynamically significant disease in the distal left circumflex probably unrelated to his symptoms.    Since I saw him a year and a half ago he remained stable.  He is now on oxygen  24/7 with some progression of his pulmonary disease.  He continues to deny chest pain.   Current Meds  Medication Sig   albuterol  (VENTOLIN  HFA) 108 (90 Base) MCG/ACT inhaler Inhale 2 puffs into the lungs every 6 (six) hours as needed for wheezing or shortness of breath. (Patient taking differently: Inhale 2 puffs into the lungs See admin instructions. Inhale 2 puffs into the lungs every 4-6 hours as needed for shortness of breath or wheezing)   ALPRAZolam  (XANAX ) 0.25 MG tablet Take 0.25 mg by mouth at bedtime as needed for sleep or anxiety.   amLODipine  (NORVASC ) 5 MG tablet Take 5 mg by mouth daily.   atorvastatin   (LIPITOR) 20 MG tablet Take 20 mg by mouth daily.   Budeson-Glycopyrrol-Formoterol  (BREZTRI  AEROSPHERE) 160-9-4.8 MCG/ACT AERO Inhale 2 puffs into the lungs in the morning and at bedtime.   folic acid  (FOLVITE ) 1 MG tablet Take 1 mg by mouth daily.   furosemide  (LASIX ) 40 MG tablet Take 40 mg by mouth in the morning.   hydrOXYzine  (ATARAX ) 25 MG tablet TAKE 1 TABLET BY MOUTH EVERY 8 HOURS AS NEEDED FOR ANXIETY. (Patient taking differently: Take 25 mg by mouth every 8 (eight) hours as needed for anxiety. for anxiety)   ipratropium-albuterol  (DUONEB) 0.5-2.5 (3) MG/3ML SOLN INHALE 3 MLS AS NEEDED TWICE A DAY AS NEEDED   methotrexate  50 MG/2ML injection Inject 0.8 mLs into the skin once a week. sundays   Multiple Vitamins-Minerals (MULTIVITAMIN WITH MINERALS) tablet Take 1 tablet by mouth daily with breakfast.   naproxen (NAPROSYN) 500 MG tablet Take 500 mg by mouth 2 (two) times daily with a meal.   omeprazole (PRILOSEC OTC) 20 MG tablet Take 20 mg by mouth daily.   OXYGEN  Inhale 3-4 L/min into the lungs continuous.   polyethylene glycol (MIRALAX  / GLYCOLAX ) 17 g packet Take 17 g by mouth daily as needed for mild constipation.   Potassium Chloride  ER 20 MEQ TBCR Take 20 mEq by mouth daily.   sertraline  (ZOLOFT ) 100 MG tablet Take 100 mg by mouth daily.   vitamin B-12 (CYANOCOBALAMIN ) 1000 MCG tablet Take 1,000 mcg by mouth  daily.     Allergies  Allergen Reactions   Fluticasone -Salmeterol Shortness Of Breath and Other (See Comments)    "Worsening shortness of breath.."    Social History   Socioeconomic History   Marital status: Divorced    Spouse name: Not on file   Number of children: 2   Years of education: 16   Highest education level: Not on file  Occupational History   Occupation: retired  Tobacco Use   Smoking status: Former    Current packs/day: 0.00    Average packs/day: 2.0 packs/day for 40.0 years (80.0 ttl pk-yrs)    Types: Cigarettes    Start date: 34    Quit date:  2010    Years since quitting: 15.3    Passive exposure: Never   Smokeless tobacco: Never  Vaping Use   Vaping status: Not on file  Substance and Sexual Activity   Alcohol use: Yes    Alcohol/week: 12.0 standard drinks of alcohol    Types: 12 Cans of beer per week   Drug use: No   Sexual activity: Not on file  Other Topics Concern   Not on file  Social History Narrative   Right handed   One story home   Social Drivers of Health   Financial Resource Strain: Not on file  Food Insecurity: No Food Insecurity (03/31/2023)   Hunger Vital Sign    Worried About Running Out of Food in the Last Year: Never true    Ran Out of Food in the Last Year: Never true  Transportation Needs: No Transportation Needs (03/31/2023)   PRAPARE - Administrator, Civil Service (Medical): No    Lack of Transportation (Non-Medical): No  Physical Activity: Not on file  Stress: Not on file  Social Connections: Socially Isolated (03/30/2023)   Social Connection and Isolation Panel [NHANES]    Frequency of Communication with Friends and Family: Three times a week    Frequency of Social Gatherings with Friends and Family: Twice a week    Attends Religious Services: Never    Database administrator or Organizations: No    Attends Banker Meetings: Never    Marital Status: Divorced  Catering manager Violence: Not At Risk (03/31/2023)   Humiliation, Afraid, Rape, and Kick questionnaire    Fear of Current or Ex-Partner: No    Emotionally Abused: No    Physically Abused: No    Sexually Abused: No     Review of Systems: General: negative for chills, fever, night sweats or weight changes.  Cardiovascular: negative for chest pain, dyspnea on exertion, edema, orthopnea, palpitations, paroxysmal nocturnal dyspnea or shortness of breath Dermatological: negative for rash Respiratory: negative for cough or wheezing Urologic: negative for hematuria Abdominal: negative for nausea, vomiting,  diarrhea, bright red blood per rectum, melena, or hematemesis Neurologic: negative for visual changes, syncope, or dizziness All other systems reviewed and are otherwise negative except as noted above.    Blood pressure (!) 118/54, pulse (!) 112, height 5\' 11"  (1.803 m), weight 202 lb (91.6 kg), SpO2 93%.  General appearance: alert and no distress Neck: no adenopathy, no carotid bruit, no JVD, supple, symmetrical, trachea midline, and thyroid  not enlarged, symmetric, no tenderness/mass/nodules Lungs: Distant breath sounds Heart: regular rate and rhythm, S1, S2 normal, no murmur, click, rub or gallop Extremities: extremities normal, atraumatic, no cyanosis or edema Pulses: 2+ and symmetric Skin: Skin color, texture, turgor normal. No rashes or lesions Neurologic: Grossly normal  EKG not  performed today      ASSESSMENT AND PLAN:   COPD (chronic obstructive pulmonary disease) (HCC) History of CAD today with long history of tobacco abuse having quit 17 years ago with 80 pack years currently on oxygen  24/7 (3 L).  Coronary artery calcification seen on CT scan History of coronary calcification with elevated coronary calcium  score of 978.  He had no physiologically significant lesions by FFR analysis.  He denies chest pain.  He is at goal for secondary prevention.  Essential hypertension History of essential hypertension with blood pressure measured today at 118/54.  He is on amlodipine .  Hyperlipidemia History of hyperlipidemia on statin therapy with lipid profile performed 12/26/2022 revealing total cholesterol 132, LDL 40 and HDL 81.  Acute on chronic respiratory failure with hypoxia (HCC) History of diastolic heart failure with echo performed 07/05/2022 revealing normal LV systolic function with grade 1 diastolic dysfunction.  He is on furosemide .  He has no significant peripheral edema.     Avanell Leigh MD FACP,FACC,FAHA, [facility] 05/15/2023 2:23 PM

## 2023-05-15 NOTE — Assessment & Plan Note (Signed)
 History of diastolic heart failure with echo performed 07/05/2022 revealing normal LV systolic function with grade 1 diastolic dysfunction.  He is on furosemide .  He has no significant peripheral edema.

## 2023-05-15 NOTE — Assessment & Plan Note (Signed)
 History of coronary calcification with elevated coronary calcium  score of 978.  He had no physiologically significant lesions by FFR analysis.  He denies chest pain.  He is at goal for secondary prevention.

## 2023-05-15 NOTE — Patient Instructions (Signed)

## 2023-05-15 NOTE — Assessment & Plan Note (Signed)
 History of CAD today with long history of tobacco abuse having quit 17 years ago with 80 pack years currently on oxygen  24/7 (3 L).

## 2023-05-15 NOTE — Assessment & Plan Note (Signed)
 History of essential hypertension with blood pressure measured today at 118/54.  He is on amlodipine .

## 2023-05-23 DIAGNOSIS — F419 Anxiety disorder, unspecified: Secondary | ICD-10-CM | POA: Diagnosis not present

## 2023-05-23 DIAGNOSIS — Z79899 Other long term (current) drug therapy: Secondary | ICD-10-CM | POA: Diagnosis not present

## 2023-05-23 DIAGNOSIS — J9611 Chronic respiratory failure with hypoxia: Secondary | ICD-10-CM | POA: Diagnosis not present

## 2023-05-25 DIAGNOSIS — Z9181 History of falling: Secondary | ICD-10-CM | POA: Diagnosis not present

## 2023-05-25 DIAGNOSIS — D649 Anemia, unspecified: Secondary | ICD-10-CM | POA: Diagnosis not present

## 2023-05-25 DIAGNOSIS — Z87891 Personal history of nicotine dependence: Secondary | ICD-10-CM | POA: Diagnosis not present

## 2023-05-25 DIAGNOSIS — I5033 Acute on chronic diastolic (congestive) heart failure: Secondary | ICD-10-CM | POA: Diagnosis not present

## 2023-05-25 DIAGNOSIS — S2242XD Multiple fractures of ribs, left side, subsequent encounter for fracture with routine healing: Secondary | ICD-10-CM | POA: Diagnosis not present

## 2023-05-25 DIAGNOSIS — E7800 Pure hypercholesterolemia, unspecified: Secondary | ICD-10-CM | POA: Diagnosis not present

## 2023-05-25 DIAGNOSIS — M069 Rheumatoid arthritis, unspecified: Secondary | ICD-10-CM | POA: Diagnosis not present

## 2023-05-25 DIAGNOSIS — K5900 Constipation, unspecified: Secondary | ICD-10-CM | POA: Diagnosis not present

## 2023-05-25 DIAGNOSIS — F1020 Alcohol dependence, uncomplicated: Secondary | ICD-10-CM | POA: Diagnosis not present

## 2023-05-25 DIAGNOSIS — I110 Hypertensive heart disease with heart failure: Secondary | ICD-10-CM | POA: Diagnosis not present

## 2023-05-25 DIAGNOSIS — J449 Chronic obstructive pulmonary disease, unspecified: Secondary | ICD-10-CM | POA: Diagnosis not present

## 2023-05-25 DIAGNOSIS — F410 Panic disorder [episodic paroxysmal anxiety] without agoraphobia: Secondary | ICD-10-CM | POA: Diagnosis not present

## 2023-05-30 DIAGNOSIS — J439 Emphysema, unspecified: Secondary | ICD-10-CM | POA: Diagnosis not present

## 2023-05-30 DIAGNOSIS — J9611 Chronic respiratory failure with hypoxia: Secondary | ICD-10-CM | POA: Diagnosis not present

## 2023-05-30 DIAGNOSIS — J449 Chronic obstructive pulmonary disease, unspecified: Secondary | ICD-10-CM | POA: Diagnosis not present

## 2023-05-31 ENCOUNTER — Encounter (HOSPITAL_BASED_OUTPATIENT_CLINIC_OR_DEPARTMENT_OTHER): Payer: Self-pay | Admitting: Primary Care

## 2023-05-31 ENCOUNTER — Ambulatory Visit (HOSPITAL_BASED_OUTPATIENT_CLINIC_OR_DEPARTMENT_OTHER): Admitting: Primary Care

## 2023-06-05 ENCOUNTER — Telehealth (HOSPITAL_BASED_OUTPATIENT_CLINIC_OR_DEPARTMENT_OTHER): Payer: Self-pay

## 2023-06-05 ENCOUNTER — Ambulatory Visit: Payer: PPO | Admitting: Primary Care

## 2023-06-05 NOTE — Telephone Encounter (Signed)
 Copied from CRM [id number]. Topic: General - Other >> Jun 05, 2023  3:36 PM Margarette Shawl wrote: Reason for CRM:   Pt is contacting clinic due to issues being seen by Villa Greaser or Sueanne Emerald, due to acute symptoms.   He reports he was informed by Villa Greaser that if he had issues with breathing issues or symptoms that could lead to possible infection, to contact clinic to be seen. He sent a message to [facility] last night reporting symptoms of dry cough, tightness in chest, and dropping oxygen  levels. He was contacted today to schedule; however, Alva and Sueanne Emerald would not be avail until 08/2023. He was scheduled with Dione Franks on 06/02 at 3 PM.   He reports from his viewpoint, this is too long to be seen by his provider and seems to go against what he was advised by Dr. Villa Greaser. He feels he needs a practice that will be able to see him in an appropriate timeline when he is having acute symptoms. He would like to know Dr. Hortense Lyons position on this matter.  If he could be seen sooner, even by another provider in office sooner, he is fine with this.  Requests call back, to either speak with Villa Greaser or his nurse.  CB#  [phone]

## 2023-06-06 NOTE — Telephone Encounter (Signed)
 Can you switch his appt  Pt has Prednisone  at home already and will start this. He also would like to see Dr Villa Greaser in the spot he suggested at 345 on Monday as its easier for him to get to [facility] than [facility]

## 2023-06-06 NOTE — Telephone Encounter (Signed)
 Spoke with pt and he states he was seen by PCP last week and they stated his lungs sounded clear but no chest xray was done. Dry hacky cough continues, his chest is really tight and fatigue is increasing. His O2 sats in the mornings are at 80-82% with activity on 4L of O2. At Rest he is at 94-95% he is wondering what next steps should be as he is worried about having to be hospitalized again. I advised him I would send you a note but that it maybe later today or tomorrow before I heard anything as you are not in the clinic. He was also advised if things got really bad before I got back with him he should proceed to the ER . Please advise

## 2023-06-06 NOTE — Telephone Encounter (Signed)
 Pt states he has actually lost some weight during this course of sickness

## 2023-06-07 ENCOUNTER — Telehealth: Payer: Self-pay | Admitting: Primary Care

## 2023-06-07 NOTE — Telephone Encounter (Signed)
 Cmn received from [employer] for O2 recertification.

## 2023-06-10 ENCOUNTER — Encounter (HOSPITAL_BASED_OUTPATIENT_CLINIC_OR_DEPARTMENT_OTHER): Payer: Self-pay | Admitting: Pulmonary Disease

## 2023-06-10 ENCOUNTER — Telehealth (HOSPITAL_BASED_OUTPATIENT_CLINIC_OR_DEPARTMENT_OTHER): Payer: Self-pay

## 2023-06-10 ENCOUNTER — Ambulatory Visit (HOSPITAL_BASED_OUTPATIENT_CLINIC_OR_DEPARTMENT_OTHER)

## 2023-06-10 ENCOUNTER — Ambulatory Visit (HOSPITAL_BASED_OUTPATIENT_CLINIC_OR_DEPARTMENT_OTHER): Admitting: Pulmonary Disease

## 2023-06-10 ENCOUNTER — Ambulatory Visit: Admitting: Internal Medicine

## 2023-06-10 VITALS — HR 103 | Ht 71.0 in | Wt 202.0 lb

## 2023-06-10 DIAGNOSIS — R918 Other nonspecific abnormal finding of lung field: Secondary | ICD-10-CM | POA: Diagnosis not present

## 2023-06-10 DIAGNOSIS — J439 Emphysema, unspecified: Secondary | ICD-10-CM

## 2023-06-10 DIAGNOSIS — R0902 Hypoxemia: Secondary | ICD-10-CM | POA: Diagnosis not present

## 2023-06-10 DIAGNOSIS — J9611 Chronic respiratory failure with hypoxia: Secondary | ICD-10-CM

## 2023-06-10 DIAGNOSIS — R0989 Other specified symptoms and signs involving the circulatory and respiratory systems: Secondary | ICD-10-CM

## 2023-06-10 DIAGNOSIS — J4489 Other specified chronic obstructive pulmonary disease: Secondary | ICD-10-CM | POA: Diagnosis not present

## 2023-06-10 DIAGNOSIS — Z87891 Personal history of nicotine dependence: Secondary | ICD-10-CM

## 2023-06-10 NOTE — Progress Notes (Signed)
 Subjective:    Patient ID: [name] [name], male    DOB: 1945/06/16, 78 y.o.   MRN: [id number]  HPI  78 yo heavy ex-smoker  For FU of  COPD & chronic resp failure with hypoxia He smoked about a pack per day  until he quit in 2010- about 40 pack years. -on portable oxygen  03/2019 -uses 2 L continuous at home and  POC 3 to 4 L when he goes out -completed pulmonary rehab 2020   PMH : new diagnosis rheumatoid arthritis. Started on methotrexate  15mg  once a week   03/2023 hospitalization  x 2 >>The first was due to a fall resulting in rib fractures, which caused pain and shortness of breath. The patient was initially on five liters of oxygen  but was weaned down to three liters. The patient was readmitted within a week due to fluid buildup and required transient BiPAP. He was diuresed about nine liters and his BNP level decreased from 136 to 99.    2 month FU visit   presents with worsening chest tightness and hypoxia.  He has experienced significant bilateral chest tightness since May 1st, which has progressively worsened. A short course of prednisone  was ineffective. He experiences a dry cough with clear sputum. Oxygen  saturation drops to 75-80% with minimal exertion, such as walking to the kitchen, but remains at 96% at rest. He is on continuous oxygen  therapy, currently at 4 liters due to inadequate oxygenation, and experiences lightheadedness and increased anxiety, managed with Xanax .  Blood pressure has been fluctuating, with recent readings of 170/80, higher than usual. His weight decreased from 200 to 198 pounds, with no significant fluid retention. He continues methotrexate  injections weekly for rheumatoid arthritis. No wheezing or production of yellow or green sputum. He feels queasy and notes increased nervousness. No significant fluid buildup and no recent use of pain medication for rib pain.  he was seen by PCP last week and they stated his lungs sounded clear but no chest xray  was done. Dry hacky cough continues, his chest is really tight and fatigue is increasing. His O2 sats in the mornings are at 80-82% with activity on 4L of O2. At Rest he is at 94-95%   Desat to 70% o arrival on his POC today , improved with 4L cont O2  Significant tests/ events reviewed   07/2020 ONO /RA -desaturation less than 88% for 23 minutes      LDCT in September 2024  showed lung RADS 2; Pulmonary nodules appear stable measuring 4.37mm or less. centrilobular and paraseptal emphysema.   HRCT chest 06/2022 no ILD CTA 06/2021 >> Upper lobe predominant emphysema and Scarring.Multiple small calcified and noncalcified pulmonary nodules are unchanged     LDCT 07/05/20 showed lung RADS 2. Centrilobular and paraseptal emphysema , unchanged nodules compared to 2020   PFTs 09/2012 moderate reversible airway obstruction, ratio 49, FEV1 44%, improved to 56%/25% bronchodilator response, TLC 88%, DLCO 55%   Spirometry 03/18/2018 showed ratio of 61, FEV1 of 43% and FVC of 52%    Review of Systems neg for any significant sore throat, dysphagia, itching, sneezing, nasal congestion or excess/ purulent secretions, fever, chills, sweats, unintended wt loss, pleuritic or exertional cp, hempoptysis, orthopnea pnd or change in chronic leg swelling. Also denies presyncope, palpitations, heartburn, abdominal pain, nausea, vomiting, diarrhea or change in bowel or urinary habits, dysuria,hematuria, rash, arthralgias, visual complaints, headache, numbness weakness or ataxia.     Objective:   Physical Exam  Gen. Pleasant, obese,  in no distress, normal affect ENT - no pallor,icterus, no post nasal drip, class 2 airway Neck: No JVD, no thyromegaly, no carotid bruits Lungs: no use of accessory muscles, no dullness to percussion, decreased without rales or rhonchi  Cardiovascular: Rhythm regular, heart sounds  normal, no murmurs or gallops, no peripheral edema Abdomen: soft and non-tender, no hepatosplenomegaly, BS  normal. Musculoskeletal: No deformities, no cyanosis or clubbing Neuro:  alert, non focal, no tremors       Assessment & Plan:   Acute and chronic respiratory failure with hypoxia Severe COPD Worsening hypoxia with exertion despite continuous oxygen  therapy. Oxygen  saturation drops to 75-80% with minimal exertion, remains at 96% at rest. No evidence of fluid overload or COPD exacerbation; lungs clear, no wheezing, or productive cough with yellow or green sputum. Reports bilateral chest tightness, initially attributed to rheumatoid arthritis, but unresponsive to prednisone . Differential includes pulmonary embolism, further evaluation if initial imaging is inconclusive. - Order chest x-ray - Increase home oxygen  to 4 liters continuously - If chest x-ray is negative, order CT angiogram to rule out pulmonary embolism -continue pred taper prescribed -continue breztri / albuterol  for rescue  Rheumatoid arthritis Treated with prednisone  for chest tightness and rib pain, suspected to be arthritis-related. Lack of symptom relief suggests current respiratory issues may not be primarily due to rheumatoid arthritis.  Anxiety Increased anxiety, possibly related to current health concerns. Manages symptoms with Xanax  as needed, up to one tablet per day.  Addendum -reviewed chest x-ray and no new infiltrates or significant pulmonary edema -  proceed with CT angiogram chest

## 2023-06-10 NOTE — Telephone Encounter (Signed)
 Per Dr. Villa Greaser. Cxr was negative and will proceed with CT. Attempted to contact patient-could not leave VM

## 2023-06-10 NOTE — Patient Instructions (Addendum)
 CXR today If no reason found, we will proceed with CT scan  Continue on breztri  & other breathing meds Continue on lasix 

## 2023-06-11 NOTE — Telephone Encounter (Signed)
 CMN faxed successfully and signed.

## 2023-06-12 ENCOUNTER — Telehealth (HOSPITAL_BASED_OUTPATIENT_CLINIC_OR_DEPARTMENT_OTHER): Payer: Self-pay

## 2023-06-12 DIAGNOSIS — J449 Chronic obstructive pulmonary disease, unspecified: Secondary | ICD-10-CM | POA: Diagnosis not present

## 2023-06-12 NOTE — Telephone Encounter (Signed)
 Copied from CRM [id number]. Topic: Clinical - Medical Advice >> Jun 11, 2023  4:25 PM Tyronne Galloway wrote: Reason for CRM: Pt stated he saw Dr. Villa Greaser on 6/2 and Dr. Villa Greaser stated he would send in a prescription of prednisone  for the pt. I attempted to find any info on them medication as well as the amount for the pt, however, I didn't find any info. Please confirm and call the pt once the medication is sent to the pharmacy. Pt prefers the [id number] - [place], [place] - [address] AT [address] OF [address]  [place] [address] Phone: [phone] Fax: [phone] Hours: Not open 24 hours as his preferred pharmacy. Pt stated Dr. Alva wanted the pt to take 40 mg. Pt's phone number is [phone] ok to leave a vm.

## 2023-06-14 MED ORDER — PREDNISONE 10 MG PO TABS: ORAL_TABLET | ORAL | 0 refills | Status: DC

## 2023-06-14 NOTE — Telephone Encounter (Signed)
 Per Dr Villa Greaser previous telephone encounter okay was given for Prednisone  40mg  but pt had some at home and did not need at the time. Per Dr Patti Border he said continue Prednisone  rx sent in [facility] approval

## 2023-06-14 NOTE — Addendum Note (Signed)
 Addended by: Kary Pages on: 06/14/2023 10:36 AM   Modules accepted: Orders

## 2023-06-16 IMAGING — US US EXTREM LOW VENOUS*R*
1 series · 14 of 24 positions shown · non-contrast
Comparison: None.

CLINICAL DATA: Right lower extremity swelling

EXAM:
RIGHT LOWER EXTREMITY VENOUS DOPPLER ULTRASOUND
TECHNIQUE: Gray-scale sonography with compression, as well as color and duplex
ultrasound, were performed to evaluate the deep venous system(s)
from the level of the common femoral vein through the popliteal and
proximal calf veins.

[Series 1: us venous img lower uni right (dvt) · portal-venous · 14 of 38 slices shown]
[im 1/38]
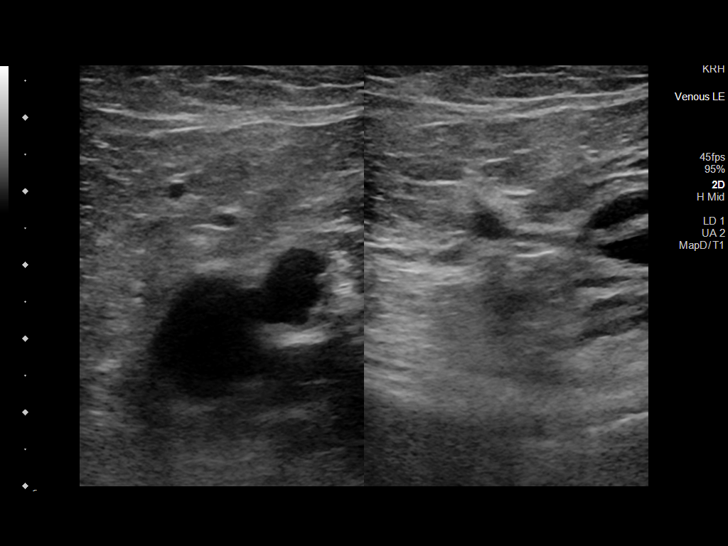
[im 4/38]
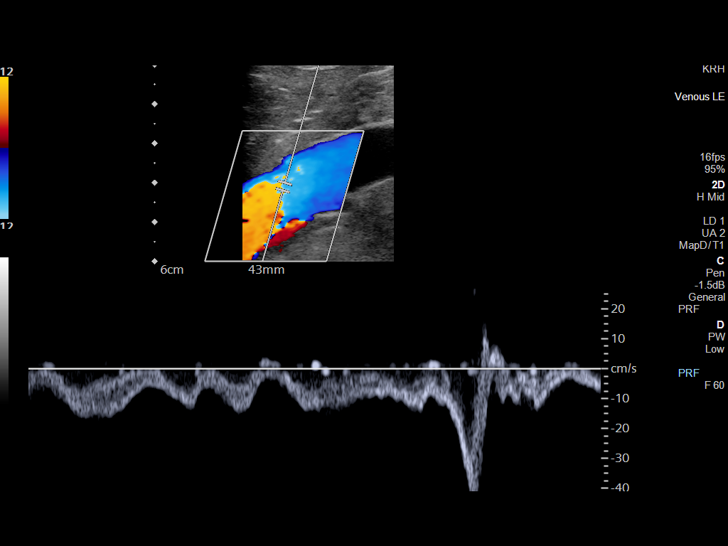
[im 7/38]
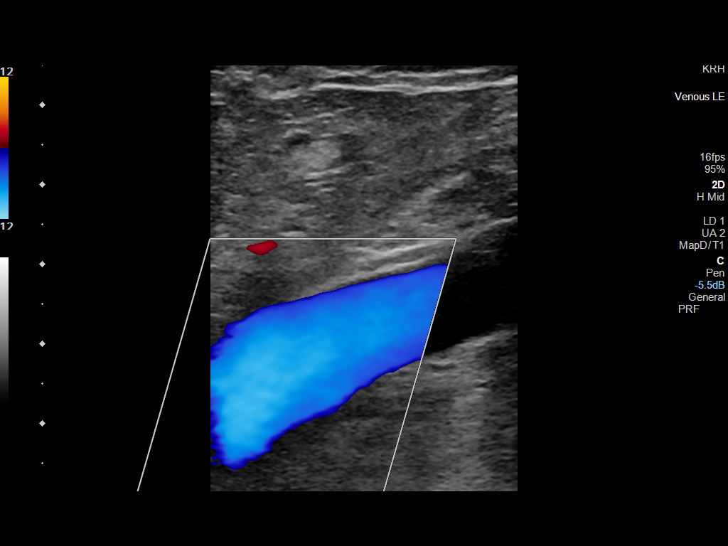
[im 10/38]
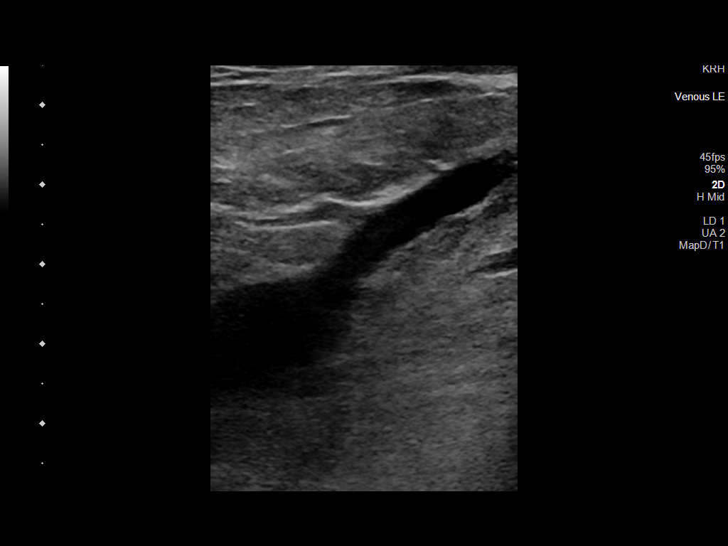
[im 12/38]
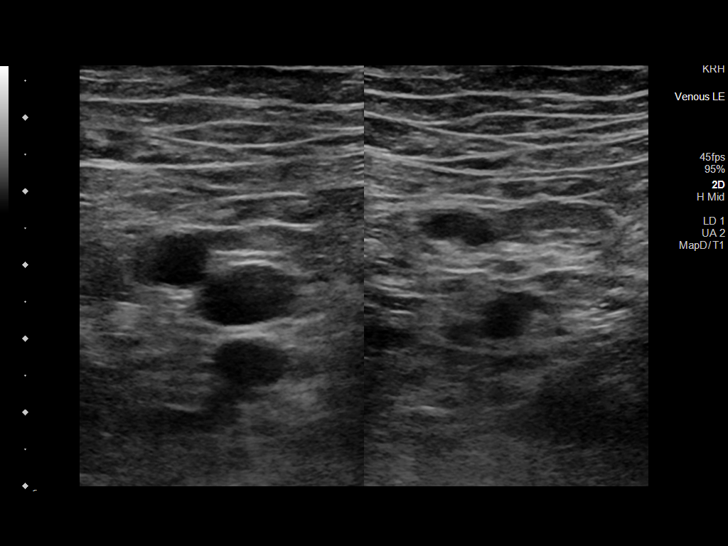
[im 15/38]
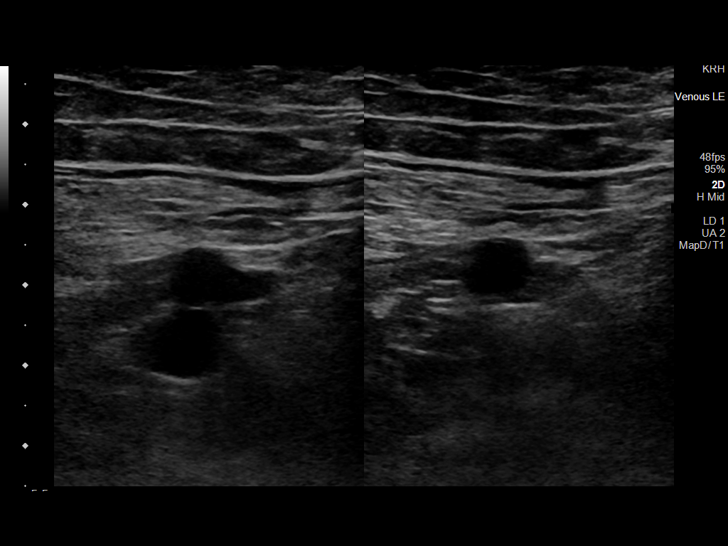
[im 18/38]
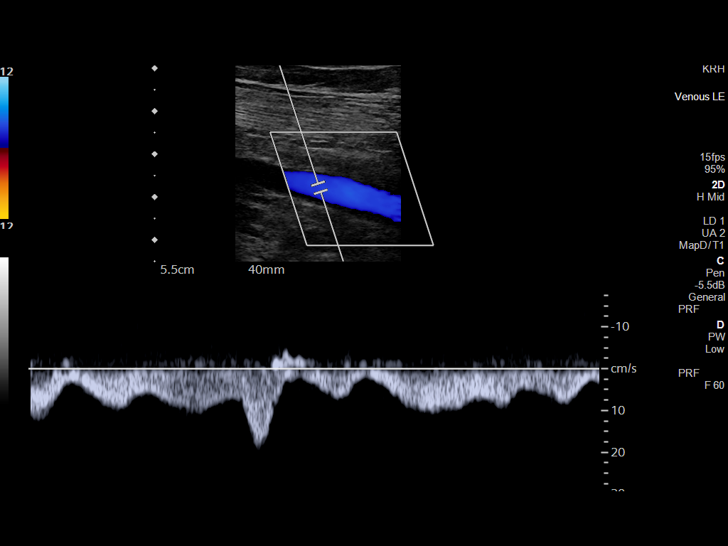
[im 20/38]
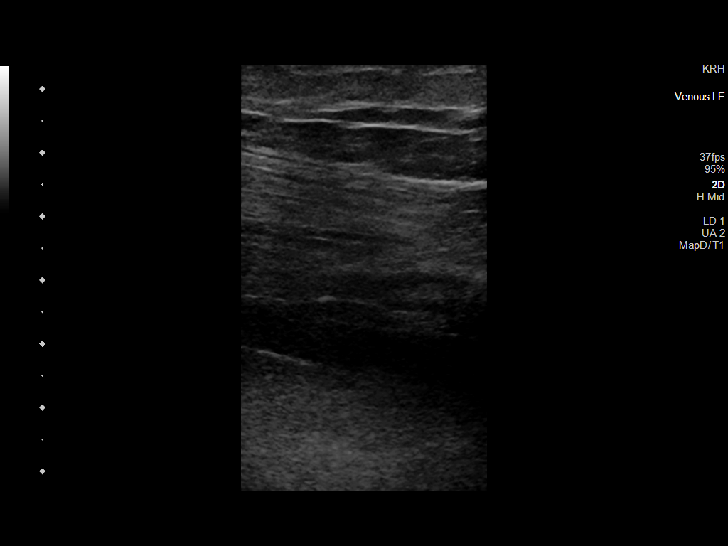
[im 23/38]
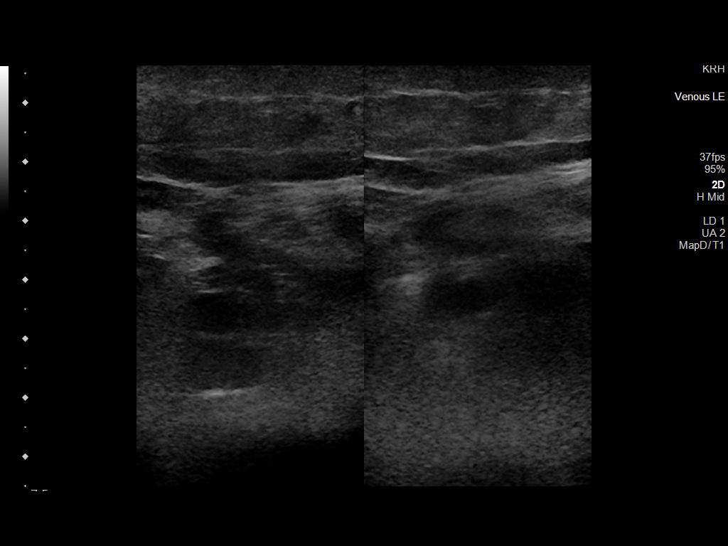
[im 26/38]
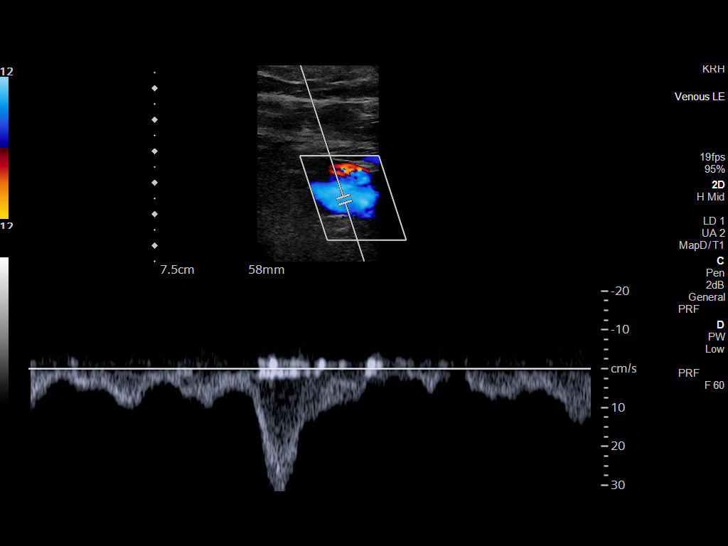
[im 29/38]
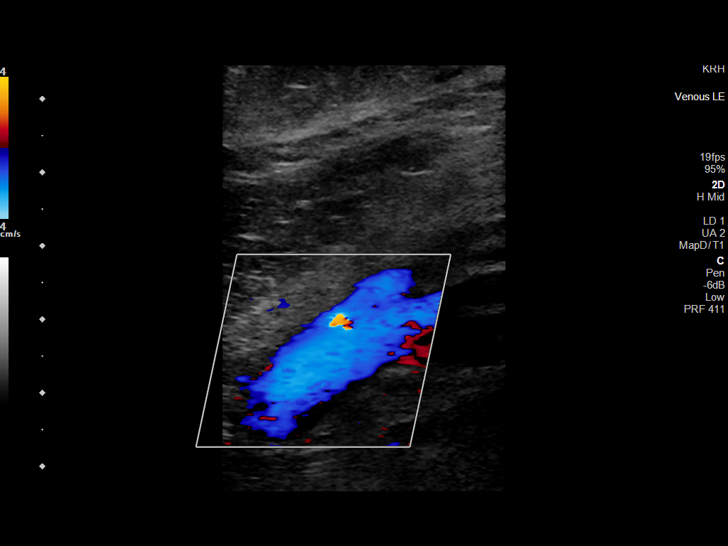
[im 31/38]
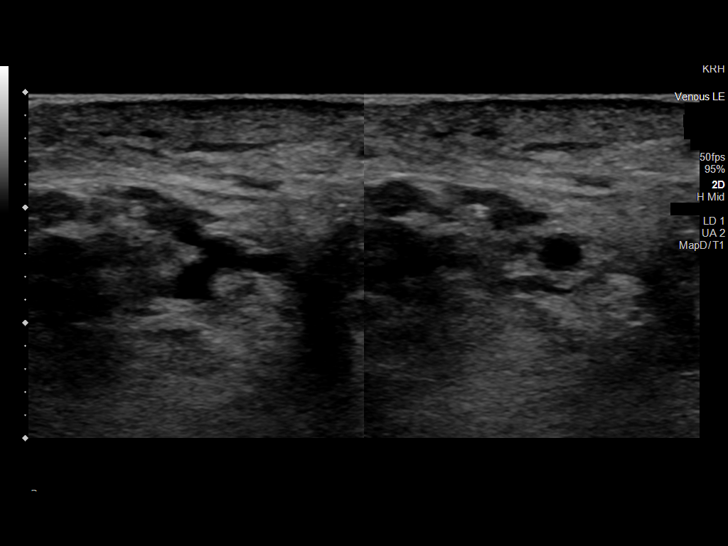
[im 34/38]
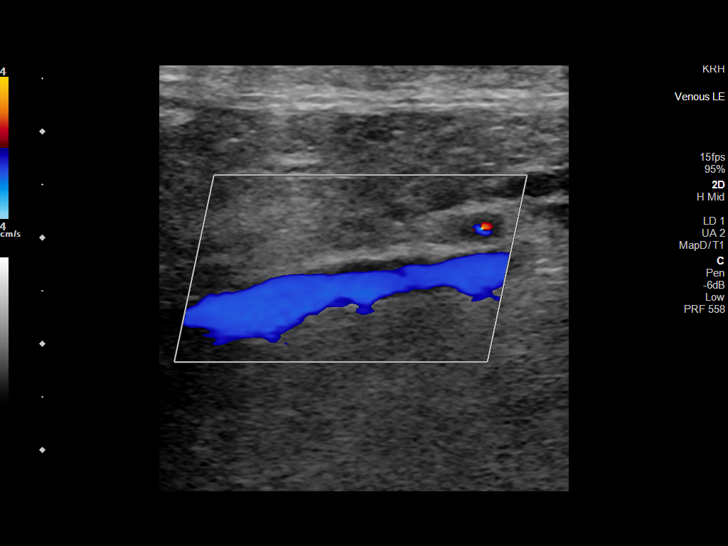
[im 38/38]
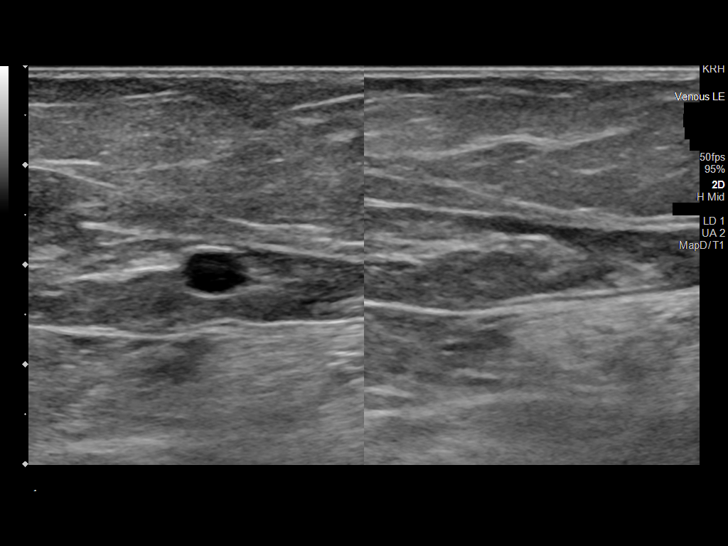

[14 of 24 positions shown; findings below may reference images not displayed]

FINDINGS: VENOUS

Normal compressibility of the common femoral, superficial femoral,
and popliteal veins, as well as the posterior tibial vein. The
peroneal vein is not visualized. Visualized portions of profunda
femoral vein and great saphenous vein unremarkable. No filling
defects to suggest DVT on grayscale or color Doppler imaging.
Doppler waveforms show normal direction of venous flow, normal
respiratory plasticity and response to augmentation.

Limited views of the contralateral common femoral vein are
unremarkable.

OTHER

None.

Limitations: Patient body habitus and edema
IMPRESSION: No evidence of DVT in the visualized veins. The peroneal vein could
not be visualized.

## 2023-06-16 NOTE — Telephone Encounter (Signed)
 Please contact him to arrange for CT angiogram chest

## 2023-06-17 ENCOUNTER — Telehealth: Payer: Self-pay | Admitting: Primary Care

## 2023-06-17 DIAGNOSIS — M0579 Rheumatoid arthritis with rheumatoid factor of multiple sites without organ or systems involvement: Secondary | ICD-10-CM | POA: Diagnosis not present

## 2023-06-17 DIAGNOSIS — M1991 Primary osteoarthritis, unspecified site: Secondary | ICD-10-CM | POA: Diagnosis not present

## 2023-06-17 DIAGNOSIS — R5382 Chronic fatigue, unspecified: Secondary | ICD-10-CM | POA: Diagnosis not present

## 2023-06-17 DIAGNOSIS — Z6828 Body mass index (BMI) 28.0-28.9, adult: Secondary | ICD-10-CM | POA: Diagnosis not present

## 2023-06-17 DIAGNOSIS — R0602 Shortness of breath: Secondary | ICD-10-CM | POA: Diagnosis not present

## 2023-06-17 DIAGNOSIS — M79641 Pain in right hand: Secondary | ICD-10-CM | POA: Diagnosis not present

## 2023-06-17 DIAGNOSIS — M79642 Pain in left hand: Secondary | ICD-10-CM | POA: Diagnosis not present

## 2023-06-17 DIAGNOSIS — E663 Overweight: Secondary | ICD-10-CM | POA: Diagnosis not present

## 2023-06-17 NOTE — Telephone Encounter (Signed)
 CMN received on 06/17/23 for Oxygen  re-certification.

## 2023-06-17 NOTE — Telephone Encounter (Signed)
 This is scheduled for 6/14 already

## 2023-06-18 ENCOUNTER — Ambulatory Visit (HOSPITAL_COMMUNITY)
Admission: RE | Admit: 2023-06-18 | Discharge: 2023-06-18 | Disposition: A | Discharge status: Home / Self Care | Source: Ambulatory Visit | Attending: Pulmonary Disease | Admitting: Pulmonary Disease

## 2023-06-18 DIAGNOSIS — J439 Emphysema, unspecified: Secondary | ICD-10-CM | POA: Insufficient documentation

## 2023-06-18 DIAGNOSIS — I700 Atherosclerosis of aorta: Secondary | ICD-10-CM | POA: Diagnosis not present

## 2023-06-18 DIAGNOSIS — J479 Bronchiectasis, uncomplicated: Secondary | ICD-10-CM | POA: Diagnosis not present

## 2023-06-18 DIAGNOSIS — J9611 Chronic respiratory failure with hypoxia: Secondary | ICD-10-CM | POA: Insufficient documentation

## 2023-06-18 DIAGNOSIS — J4489 Other specified chronic obstructive pulmonary disease: Secondary | ICD-10-CM | POA: Diagnosis not present

## 2023-06-18 DIAGNOSIS — R0602 Shortness of breath: Secondary | ICD-10-CM | POA: Diagnosis not present

## 2023-06-18 MED ORDER — IOHEXOL 350 MG/ML SOLN
75.0000 mL | Freq: Once | INTRAVENOUS | Status: AC | PRN
  Administered 2023-06-18: 75 mL via INTRAVENOUS

## 2023-06-19 ENCOUNTER — Ambulatory Visit: Payer: Self-pay | Admitting: Pulmonary Disease

## 2023-06-19 ENCOUNTER — Telehealth (HOSPITAL_BASED_OUTPATIENT_CLINIC_OR_DEPARTMENT_OTHER): Payer: Self-pay

## 2023-06-20 DIAGNOSIS — R0602 Shortness of breath: Secondary | ICD-10-CM | POA: Diagnosis not present

## 2023-06-22 ENCOUNTER — Ambulatory Visit (HOSPITAL_BASED_OUTPATIENT_CLINIC_OR_DEPARTMENT_OTHER)

## 2023-06-24 ENCOUNTER — Inpatient Hospital Stay (HOSPITAL_COMMUNITY)
Admission: EM | Admit: 2023-06-24 | Source: Home / Self Care | Attending: Internal Medicine | Admitting: Internal Medicine

## 2023-06-24 ENCOUNTER — Other Ambulatory Visit: Payer: Self-pay

## 2023-06-24 ENCOUNTER — Emergency Department (HOSPITAL_COMMUNITY)

## 2023-06-24 ENCOUNTER — Encounter (HOSPITAL_COMMUNITY): Payer: Self-pay

## 2023-06-24 DIAGNOSIS — K219 Gastro-esophageal reflux disease without esophagitis: Secondary | ICD-10-CM | POA: Diagnosis present

## 2023-06-24 DIAGNOSIS — F410 Panic disorder [episodic paroxysmal anxiety] without agoraphobia: Secondary | ICD-10-CM | POA: Diagnosis not present

## 2023-06-24 DIAGNOSIS — I110 Hypertensive heart disease with heart failure: Secondary | ICD-10-CM | POA: Diagnosis present

## 2023-06-24 DIAGNOSIS — J9622 Acute and chronic respiratory failure with hypercapnia: Secondary | ICD-10-CM | POA: Diagnosis present

## 2023-06-24 DIAGNOSIS — I5032 Chronic diastolic (congestive) heart failure: Secondary | ICD-10-CM | POA: Diagnosis not present

## 2023-06-24 DIAGNOSIS — R0602 Shortness of breath: Secondary | ICD-10-CM | POA: Diagnosis not present

## 2023-06-24 DIAGNOSIS — Z7401 Bed confinement status: Secondary | ICD-10-CM | POA: Diagnosis not present

## 2023-06-24 DIAGNOSIS — Z1152 Encounter for screening for COVID-19: Secondary | ICD-10-CM

## 2023-06-24 DIAGNOSIS — M069 Rheumatoid arthritis, unspecified: Secondary | ICD-10-CM | POA: Diagnosis present

## 2023-06-24 DIAGNOSIS — E7800 Pure hypercholesterolemia, unspecified: Secondary | ICD-10-CM | POA: Diagnosis not present

## 2023-06-24 DIAGNOSIS — R000 Tachycardia, unspecified: Secondary | ICD-10-CM | POA: Diagnosis not present

## 2023-06-24 DIAGNOSIS — R6510 Systemic inflammatory response syndrome (SIRS) of non-infectious origin without acute organ dysfunction: Secondary | ICD-10-CM | POA: Diagnosis not present

## 2023-06-24 DIAGNOSIS — E876 Hypokalemia: Secondary | ICD-10-CM | POA: Diagnosis not present

## 2023-06-24 DIAGNOSIS — J9611 Chronic respiratory failure with hypoxia: Secondary | ICD-10-CM | POA: Diagnosis present

## 2023-06-24 DIAGNOSIS — J189 Pneumonia, unspecified organism: Secondary | ICD-10-CM | POA: Diagnosis present

## 2023-06-24 DIAGNOSIS — J9621 Acute and chronic respiratory failure with hypoxia: Secondary | ICD-10-CM | POA: Diagnosis present

## 2023-06-24 DIAGNOSIS — E871 Hypo-osmolality and hyponatremia: Secondary | ICD-10-CM | POA: Diagnosis not present

## 2023-06-24 DIAGNOSIS — I5033 Acute on chronic diastolic (congestive) heart failure: Secondary | ICD-10-CM | POA: Diagnosis not present

## 2023-06-24 DIAGNOSIS — J449 Chronic obstructive pulmonary disease, unspecified: Secondary | ICD-10-CM | POA: Diagnosis not present

## 2023-06-24 DIAGNOSIS — I10 Essential (primary) hypertension: Secondary | ICD-10-CM | POA: Diagnosis present

## 2023-06-24 DIAGNOSIS — R339 Retention of urine, unspecified: Secondary | ICD-10-CM | POA: Diagnosis present

## 2023-06-24 DIAGNOSIS — E785 Hyperlipidemia, unspecified: Secondary | ICD-10-CM | POA: Diagnosis present

## 2023-06-24 DIAGNOSIS — J441 Chronic obstructive pulmonary disease with (acute) exacerbation: Secondary | ICD-10-CM | POA: Diagnosis not present

## 2023-06-24 DIAGNOSIS — J1569 Pneumonia due to other gram-negative bacteria: Secondary | ICD-10-CM | POA: Diagnosis present

## 2023-06-24 DIAGNOSIS — R0789 Other chest pain: Secondary | ICD-10-CM | POA: Diagnosis not present

## 2023-06-24 DIAGNOSIS — J440 Chronic obstructive pulmonary disease with acute lower respiratory infection: Secondary | ICD-10-CM | POA: Diagnosis not present

## 2023-06-24 DIAGNOSIS — F32A Depression, unspecified: Secondary | ICD-10-CM | POA: Diagnosis not present

## 2023-06-24 DIAGNOSIS — F39 Unspecified mood [affective] disorder: Secondary | ICD-10-CM | POA: Diagnosis not present

## 2023-06-24 DIAGNOSIS — F411 Generalized anxiety disorder: Secondary | ICD-10-CM | POA: Diagnosis present

## 2023-06-24 DIAGNOSIS — Z9981 Dependence on supplemental oxygen: Secondary | ICD-10-CM

## 2023-06-24 DIAGNOSIS — K5900 Constipation, unspecified: Secondary | ICD-10-CM | POA: Diagnosis present

## 2023-06-24 DIAGNOSIS — R278 Other lack of coordination: Secondary | ICD-10-CM | POA: Diagnosis not present

## 2023-06-24 DIAGNOSIS — R0902 Hypoxemia: Secondary | ICD-10-CM | POA: Diagnosis not present

## 2023-06-24 DIAGNOSIS — Z87891 Personal history of nicotine dependence: Secondary | ICD-10-CM | POA: Diagnosis not present

## 2023-06-24 DIAGNOSIS — Z888 Allergy status to other drugs, medicaments and biological substances status: Secondary | ICD-10-CM

## 2023-06-24 DIAGNOSIS — Z79899 Other long term (current) drug therapy: Secondary | ICD-10-CM

## 2023-06-24 DIAGNOSIS — Z800 Family history of malignant neoplasm of digestive organs: Secondary | ICD-10-CM

## 2023-06-24 DIAGNOSIS — I2510 Atherosclerotic heart disease of native coronary artery without angina pectoris: Secondary | ICD-10-CM | POA: Diagnosis present

## 2023-06-24 DIAGNOSIS — R5381 Other malaise: Secondary | ICD-10-CM | POA: Diagnosis present

## 2023-06-24 DIAGNOSIS — R0689 Other abnormalities of breathing: Secondary | ICD-10-CM | POA: Diagnosis not present

## 2023-06-24 DIAGNOSIS — J90 Pleural effusion, not elsewhere classified: Secondary | ICD-10-CM | POA: Diagnosis not present

## 2023-06-24 DIAGNOSIS — R2689 Other abnormalities of gait and mobility: Secondary | ICD-10-CM | POA: Diagnosis not present

## 2023-06-24 DIAGNOSIS — Z7951 Long term (current) use of inhaled steroids: Secondary | ICD-10-CM

## 2023-06-24 DIAGNOSIS — Z79631 Long term (current) use of antimetabolite agent: Secondary | ICD-10-CM | POA: Diagnosis not present

## 2023-06-24 DIAGNOSIS — R918 Other nonspecific abnormal finding of lung field: Secondary | ICD-10-CM | POA: Diagnosis not present

## 2023-06-24 DIAGNOSIS — G621 Alcoholic polyneuropathy: Secondary | ICD-10-CM | POA: Diagnosis not present

## 2023-06-24 DIAGNOSIS — J439 Emphysema, unspecified: Secondary | ICD-10-CM | POA: Diagnosis not present

## 2023-06-24 DIAGNOSIS — F419 Anxiety disorder, unspecified: Secondary | ICD-10-CM | POA: Diagnosis not present

## 2023-06-24 DIAGNOSIS — M6281 Muscle weakness (generalized): Secondary | ICD-10-CM | POA: Diagnosis not present

## 2023-06-24 LAB — CBC WITH DIFFERENTIAL/PLATELET
Abs Immature Granulocytes: 0.04 10*3/uL (ref 0.00–0.07)
Basophils Absolute: 0 10*3/uL (ref 0.0–0.1)
Basophils Relative: 0 %
Eosinophils Absolute: 0.3 10*3/uL (ref 0.0–0.5)
Eosinophils Relative: 4 %
HCT: 39.8 % (ref 39.0–52.0)
Hemoglobin: 13.1 g/dL (ref 13.0–17.0)
Immature Granulocytes: 1 %
Lymphocytes Relative: 7 %
Lymphs Abs: 0.6 10*3/uL — ABNORMAL LOW (ref 0.7–4.0)
MCH: 35 pg — ABNORMAL HIGH (ref 26.0–34.0)
MCHC: 32.9 g/dL (ref 30.0–36.0)
MCV: 106.4 fL — ABNORMAL HIGH (ref 80.0–100.0)
Monocytes Absolute: 0.8 10*3/uL (ref 0.1–1.0)
Monocytes Relative: 9 %
Neutro Abs: 6.4 10*3/uL (ref 1.7–7.7)
Neutrophils Relative %: 79 %
Platelets: 141 10*3/uL — ABNORMAL LOW (ref 150–400)
RBC: 3.74 MIL/uL — ABNORMAL LOW (ref 4.22–5.81)
RDW: 15.1 % (ref 11.5–15.5)
WBC: 8.2 10*3/uL (ref 4.0–10.5)
nRBC: 0 % (ref 0.0–0.2)

## 2023-06-24 LAB — COMPREHENSIVE METABOLIC PANEL WITH GFR
ALT: 16 U/L (ref 0–44)
AST: 22 U/L (ref 15–41)
Albumin: 3.1 g/dL — ABNORMAL LOW (ref 3.5–5.0)
Alkaline Phosphatase: 107 U/L (ref 38–126)
Anion gap: 12 (ref 5–15)
BUN: 13 mg/dL (ref 8–23)
CO2: 31 mmol/L (ref 22–32)
Calcium: 8.3 mg/dL — ABNORMAL LOW (ref 8.9–10.3)
Chloride: 88 mmol/L — ABNORMAL LOW (ref 98–111)
Creatinine, Ser: 0.77 mg/dL (ref 0.61–1.24)
GFR, Estimated: >60 mL/min (ref >60)
Glucose, Bld: 124 mg/dL — ABNORMAL HIGH (ref 70–99)
Potassium: 3.2 mmol/L — ABNORMAL LOW (ref 3.5–5.1)
Sodium: 131 mmol/L — ABNORMAL LOW (ref 135–145)
Total Bilirubin: 0.7 mg/dL (ref 0.0–1.2)
Total Protein: 6.7 g/dL (ref 6.5–8.1)

## 2023-06-24 LAB — RESP PANEL BY RT-PCR (RSV, FLU A&B, COVID)  RVPGX2
Influenza A by PCR: NEGATIVE
Influenza B by PCR: NEGATIVE
Resp Syncytial Virus by PCR: NEGATIVE
SARS Coronavirus 2 by RT PCR: NEGATIVE

## 2023-06-24 LAB — I-STAT CHEM 8, ED
BUN: 12 mg/dL (ref 8–23)
Calcium, Ion: 1.13 mmol/L — ABNORMAL LOW (ref 1.15–1.40)
Chloride: 86 mmol/L — ABNORMAL LOW (ref 98–111)
Creatinine, Ser: 0.8 mg/dL (ref 0.61–1.24)
Glucose, Bld: 123 mg/dL — ABNORMAL HIGH (ref 70–99)
HCT: 42 % (ref 39.0–52.0)
Hemoglobin: 14.3 g/dL (ref 13.0–17.0)
Potassium: 3.4 mmol/L — ABNORMAL LOW (ref 3.5–5.1)
Sodium: 133 mmol/L — ABNORMAL LOW (ref 135–145)
TCO2: 34 mmol/L — ABNORMAL HIGH (ref 22–32)

## 2023-06-24 LAB — BLOOD GAS, VENOUS
Acid-Base Excess: 9.5 mmol/L — ABNORMAL HIGH (ref 0.0–2.0)
Bicarbonate: 36.5 mmol/L — ABNORMAL HIGH (ref 20.0–28.0)
O2 Saturation: 96.1 %
Patient temperature: 37
pCO2, Ven: 59 mmHg (ref 44–60)
pH, Ven: 7.4 (ref 7.25–7.43)
pO2, Ven: 71 mmHg — ABNORMAL HIGH (ref 32–45)

## 2023-06-24 LAB — BRAIN NATRIURETIC PEPTIDE: B Natriuretic Peptide: 35.6 pg/mL (ref 0.0–100.0)

## 2023-06-24 LAB — I-STAT CG4 LACTIC ACID, ED: Lactic Acid, Venous: 1.2 mmol/L (ref 0.5–1.9)

## 2023-06-24 LAB — TROPONIN I (HIGH SENSITIVITY)
Troponin I (High Sensitivity): 9 ng/L (ref <18)
Troponin I (High Sensitivity): 14 ng/L (ref <18)

## 2023-06-24 MED ORDER — SODIUM CHLORIDE 0.9 % IV SOLN
1.0000 g | INTRAVENOUS | Status: DC
  Administered 2023-06-25 – 2023-06-27 (×3): 1 g via INTRAVENOUS
  Filled 2023-06-24 (×3): qty 10

## 2023-06-24 MED ORDER — ACETAMINOPHEN 500 MG PO TABS
1000.0000 mg | ORAL_TABLET | Freq: Four times a day (QID) | ORAL | Status: DC | PRN
  Administered 2023-06-26 – 2023-06-30 (×3): 1000 mg via ORAL
  Filled 2023-06-24 (×3): qty 2

## 2023-06-24 MED ORDER — ENOXAPARIN SODIUM 40 MG/0.4ML IJ SOSY
40.0000 mg | PREFILLED_SYRINGE | INTRAMUSCULAR | Status: DC
  Administered 2023-06-25 – 2023-07-01 (×7): 40 mg via SUBCUTANEOUS
  Filled 2023-06-24 (×7): qty 0.4

## 2023-06-24 MED ORDER — ATORVASTATIN CALCIUM 40 MG PO TABS
20.0000 mg | ORAL_TABLET | Freq: Every day | ORAL | Status: DC
  Administered 2023-06-25 – 2023-07-01 (×7): 20 mg via ORAL
  Filled 2023-06-24 (×7): qty 1

## 2023-06-24 MED ORDER — FOLIC ACID 1 MG PO TABS
1.0000 mg | ORAL_TABLET | Freq: Every day | ORAL | Status: DC
  Administered 2023-06-25 – 2023-07-01 (×7): 1 mg via ORAL
  Filled 2023-06-24 (×7): qty 1

## 2023-06-24 MED ORDER — CHLORHEXIDINE GLUCONATE CLOTH 2 % EX PADS
6.0000 | MEDICATED_PAD | Freq: Every day | CUTANEOUS | Status: DC
  Administered 2023-06-25 – 2023-07-01 (×8): 6 via TOPICAL

## 2023-06-24 MED ORDER — IPRATROPIUM-ALBUTEROL 0.5-2.5 (3) MG/3ML IN SOLN
3.0000 mL | Freq: Four times a day (QID) | RESPIRATORY_TRACT | Status: DC
  Administered 2023-06-24 – 2023-07-01 (×27): 3 mL via RESPIRATORY_TRACT
  Filled 2023-06-24 (×27): qty 3

## 2023-06-24 MED ORDER — SERTRALINE HCL 100 MG PO TABS
100.0000 mg | ORAL_TABLET | Freq: Every day | ORAL | Status: DC
  Administered 2023-06-25 – 2023-07-01 (×7): 100 mg via ORAL
  Filled 2023-06-24 (×7): qty 1

## 2023-06-24 MED ORDER — PROCHLORPERAZINE EDISYLATE 10 MG/2ML IJ SOLN: 10.0000 mg | Freq: Four times a day (QID) | INTRAMUSCULAR | Status: DC | PRN

## 2023-06-24 MED ORDER — VITAMIN B-12 1000 MCG PO TABS
1000.0000 ug | ORAL_TABLET | Freq: Every day | ORAL | Status: DC
  Administered 2023-06-25 – 2023-07-01 (×7): 1000 ug via ORAL
  Filled 2023-06-24 (×7): qty 1

## 2023-06-24 MED ORDER — METHYLPREDNISOLONE SODIUM SUCC 125 MG IJ SOLR
125.0000 mg | Freq: Once | INTRAMUSCULAR | Status: AC
  Administered 2023-06-24: 125 mg via INTRAVENOUS
  Filled 2023-06-24: qty 2

## 2023-06-24 MED ORDER — MELATONIN 3 MG PO TABS
6.0000 mg | ORAL_TABLET | Freq: Every evening | ORAL | Status: DC | PRN
  Administered 2023-06-25: 6 mg via ORAL
  Filled 2023-06-24: qty 2

## 2023-06-24 MED ORDER — ALPRAZOLAM 0.25 MG PO TABS
0.2500 mg | ORAL_TABLET | Freq: Every day | ORAL | Status: DC | PRN
  Administered 2023-06-25 – 2023-06-27 (×3): 0.25 mg via ORAL
  Filled 2023-06-24 (×3): qty 1

## 2023-06-24 MED ORDER — ALBUTEROL SULFATE (2.5 MG/3ML) 0.083% IN NEBU
2.5000 mg | INHALATION_SOLUTION | RESPIRATORY_TRACT | Status: DC | PRN
  Administered 2023-06-26: 2.5 mg via RESPIRATORY_TRACT
  Filled 2023-06-24: qty 3

## 2023-06-24 MED ORDER — FUROSEMIDE 40 MG PO TABS
40.0000 mg | ORAL_TABLET | Freq: Every morning | ORAL | Status: DC
  Administered 2023-06-25 – 2023-07-01 (×7): 40 mg via ORAL
  Filled 2023-06-24 (×7): qty 1

## 2023-06-24 MED ORDER — METHOCARBAMOL 500 MG PO TABS: 500.0000 mg | ORAL_TABLET | Freq: Three times a day (TID) | ORAL | Status: DC | PRN

## 2023-06-24 MED ORDER — ONDANSETRON HCL 4 MG/2ML IJ SOLN: 4.0000 mg | Freq: Four times a day (QID) | INTRAMUSCULAR | Status: DC | PRN

## 2023-06-24 MED ORDER — SODIUM CHLORIDE 0.9% FLUSH
3.0000 mL | Freq: Two times a day (BID) | INTRAVENOUS | Status: DC
  Administered 2023-06-24 – 2023-07-01 (×14): 3 mL via INTRAVENOUS

## 2023-06-24 MED ORDER — SODIUM CHLORIDE 0.9 % IV SOLN
1.0000 g | Freq: Once | INTRAVENOUS | Status: AC
  Administered 2023-06-24: 1 g via INTRAVENOUS
  Filled 2023-06-24: qty 10

## 2023-06-24 MED ORDER — PANTOPRAZOLE SODIUM 40 MG PO TBEC
40.0000 mg | DELAYED_RELEASE_TABLET | Freq: Every day | ORAL | Status: DC
  Administered 2023-06-25 – 2023-07-01 (×7): 40 mg via ORAL
  Filled 2023-06-24 (×7): qty 1

## 2023-06-24 MED ORDER — IPRATROPIUM-ALBUTEROL 0.5-2.5 (3) MG/3ML IN SOLN
3.0000 mL | RESPIRATORY_TRACT | Status: AC
  Administered 2023-06-24 (×3): 3 mL via RESPIRATORY_TRACT
  Filled 2023-06-24: qty 9

## 2023-06-24 MED ORDER — POTASSIUM CHLORIDE 10 MEQ/100ML IV SOLN
10.0000 meq | INTRAVENOUS | Status: AC
  Administered 2023-06-24 – 2023-06-25 (×3): 10 meq via INTRAVENOUS
  Filled 2023-06-24 (×3): qty 100

## 2023-06-24 MED ORDER — AZITHROMYCIN 250 MG PO TABS
500.0000 mg | ORAL_TABLET | Freq: Every day | ORAL | Status: AC
  Administered 2023-06-25 – 2023-06-26 (×2): 500 mg via ORAL
  Filled 2023-06-24 (×2): qty 2

## 2023-06-24 MED ORDER — BUDESON-GLYCOPYRROL-FORMOTEROL 160-9-4.8 MCG/ACT IN AERO
2.0000 | INHALATION_SPRAY | Freq: Two times a day (BID) | RESPIRATORY_TRACT | Status: DC
  Administered 2023-06-25 – 2023-07-01 (×12): 2 via RESPIRATORY_TRACT
  Filled 2023-06-24: qty 5.9

## 2023-06-24 MED ORDER — POLYETHYLENE GLYCOL 3350 17 G PO PACK
17.0000 g | PACK | Freq: Every day | ORAL | Status: DC | PRN
  Administered 2023-06-25 – 2023-06-30 (×3): 17 g via ORAL
  Filled 2023-06-24 (×3): qty 1

## 2023-06-24 MED ORDER — METHYLPREDNISOLONE SODIUM SUCC 125 MG IJ SOLR
60.0000 mg | Freq: Two times a day (BID) | INTRAMUSCULAR | Status: DC
  Administered 2023-06-25 – 2023-06-29 (×9): 60 mg via INTRAVENOUS
  Filled 2023-06-24 (×9): qty 2

## 2023-06-24 MED ORDER — SODIUM CHLORIDE 0.9 % IV SOLN
500.0000 mg | Freq: Once | INTRAVENOUS | Status: AC
  Administered 2023-06-24: 500 mg via INTRAVENOUS
  Filled 2023-06-24: qty 5

## 2023-06-24 NOTE — ED Provider Notes (Signed)
 [place] EMERGENCY DEPARTMENT AT [facility] Provider Note   CSN: [id number] Arrival date & time: 06/24/23  1916     History  Chief Complaint  Patient presents with   Shortness of Breath    [name] [name] is a 78 y.o. male with PMH as listed below who presents with shortness of breath.  History of COPD, at baseline 3 to 5 L.  Patient states that he has been satting between 60 and 80% intermittently on 5 L nasal cannula at home. He has been short of breath for several weeks but states it has been worse for the last couple of days. Endorses wheezing and increased cough, and denies fever. He has been experiencing intermittent chest pain and ultimately had a recent CTA PE on 6/10 which was negative for PE and there was no airspace disease at that time.  Satting 94% on 15 L nonrebreather.   Past Medical History:  Diagnosis Date   COPD (chronic obstructive pulmonary disease) (HCC)    High cholesterol    Hypertension        Home Medications Prior to Admission medications   Medication Sig Start Date End Date Taking? Authorizing Provider  albuterol  (VENTOLIN  HFA) 108 (90 Base) MCG/ACT inhaler Inhale 2 puffs into the lungs every 6 (six) hours as needed for wheezing or shortness of breath. Patient taking differently: Inhale 2 puffs into the lungs See admin instructions. Inhale 2 puffs into the lungs every 4-6 hours as needed for shortness of breath or wheezing 12/08/21   Jude Harden GAILS, MD  ALPRAZolam  (XANAX ) 0.25 MG tablet Take 0.25 mg by mouth at bedtime as needed for sleep or anxiety.    [provider]  amLODipine  (NORVASC ) 5 MG tablet Take 5 mg by mouth daily. 11/23/22   [provider]  atorvastatin  (LIPITOR) 20 MG tablet Take 20 mg by mouth daily. 07/01/22   [provider]  Budeson-Glycopyrrol-Formoterol  (BREZTRI  AEROSPHERE) 160-9-4.8 MCG/ACT AERO Inhale 2 puffs into the lungs in the morning and at bedtime. 02/11/23   Hope Almarie ORN, NP   folic acid  (FOLVITE ) 1 MG tablet Take 1 mg by mouth daily. 11/01/22   [provider]  furosemide  (LASIX ) 40 MG tablet Take 40 mg by mouth in the morning. 03/06/22   [provider]  hydrOXYzine  (ATARAX ) 25 MG tablet TAKE 1 TABLET BY MOUTH EVERY 8 HOURS AS NEEDED FOR ANXIETY. Patient taking differently: Take 25 mg by mouth every 8 (eight) hours as needed for anxiety. for anxiety 11/23/22   Hope Almarie ORN, NP  ipratropium-albuterol  (DUONEB) 0.5-2.5 (3) MG/3ML SOLN INHALE 3 MLS AS NEEDED TWICE A DAY AS NEEDED 03/29/23   [provider]  methocarbamol  (ROBAXIN ) 500 MG tablet Take 1 tablet (500 mg total) by mouth every 8 (eight) hours as needed for muscle spasms. Patient not taking: Reported on 05/15/2023 03/24/23   Vernon Ranks, MD  methotrexate  50 MG/2ML injection Inject 0.8 mLs into the skin once a week. sundays    [provider]  Multiple Vitamins-Minerals (MULTIVITAMIN WITH MINERALS) tablet Take 1 tablet by mouth daily with breakfast.    [provider]  naproxen (NAPROSYN) 500 MG tablet Take 500 mg by mouth 2 (two) times daily with a meal.    [provider]  omeprazole (PRILOSEC OTC) 20 MG tablet Take 20 mg by mouth daily.    [provider]  OXYGEN  Inhale 3-4 L/min into the lungs continuous.    [provider]  polyethylene glycol (MIRALAX  /  GLYCOLAX ) 17 g packet Take 17 g by mouth daily as needed for mild constipation. 07/09/22   Shalhoub, Zachary PARAS, MD  Potassium Chloride  ER 20 MEQ TBCR Take 20 mEq by mouth daily. 02/02/22   [provider]  predniSONE  (DELTASONE ) 10 MG tablet 4 tabs x 5 days 06/14/23   Hope Almarie ORN, NP  sertraline  (ZOLOFT ) 100 MG tablet Take 100 mg by mouth daily. 02/22/23   [provider]  vitamin B-12 (CYANOCOBALAMIN ) 1000 MCG tablet Take 1,000 mcg by mouth daily.    [provider]      Allergies    Fluticasone -salmeterol    Review of Systems   Review of Systems A  10 point review of systems was performed and is negative unless otherwise reported in HPI.  Physical Exam Updated Vital Signs BP 114/83 (BP Location: Right Arm)   Pulse (!) 123   Temp 98.7 F (37.1 C) (Oral)   Resp (!) 24   SpO2 100%  Physical Exam General: Male in mild resp distress HEENT: PERRLA, Sclera anicteric, MMM, trachea midline.  Cardiology: regular tachycardic rate, no murmurs/rubs/gallops.  Resp :Mild resp distress, increased resp effort, mild exp wheezing, no rhonchi/rales Abd: Soft, non-tender, non-distended. No rebound tenderness or guarding.  GU: Deferred. MSK: No peripheral edema or signs of trauma. Extremities without deformity or TTP.  Skin: warm, dry.  Neuro: A&Ox4, CNs II-XII grossly intact. MAEs. Sensation grossly intact.  Psych: Normal mood and affect.   ED Results / Procedures / Treatments   Labs (all labs ordered are listed, but only abnormal results are displayed) Labs Reviewed  CBC WITH DIFFERENTIAL/PLATELET - Abnormal; Notable for the following components:      Result Value   RBC 3.74 (*)    MCV 106.4 (*)    MCH 35.0 (*)    Platelets 141 (*)    Lymphs Abs 0.6 (*)    All other components within normal limits  COMPREHENSIVE METABOLIC PANEL WITH GFR - Abnormal; Notable for the following components:   Sodium 131 (*)    Potassium 3.2 (*)    Chloride 88 (*)    Glucose, Bld 124 (*)    Calcium  8.3 (*)    Albumin 3.1 (*)    All other components within normal limits  BLOOD GAS, VENOUS - Abnormal; Notable for the following components:   pO2, Ven 71 (*)    Bicarbonate 36.5 (*)    Acid-Base Excess 9.5 (*)    All other components within normal limits  MAGNESIUM  - Abnormal; Notable for the following components:   Magnesium  1.5 (*)    All other components within normal limits  I-STAT CHEM 8, ED - Abnormal; Notable for the following components:   Sodium 133 (*)    Potassium 3.4 (*)    Chloride 86 (*)    Glucose, Bld 123 (*)    Calcium , Ion 1.13 (*)     TCO2 34 (*)    All other components within normal limits  RESP PANEL BY RT-PCR (RSV, FLU A&B, COVID)  RVPGX2  CULTURE, BLOOD (ROUTINE X 2)  CULTURE, BLOOD (ROUTINE X 2)  EXPECTORATED SPUTUM ASSESSMENT W GRAM STAIN, RFLX TO RESP C  MRSA NEXT GEN BY PCR, NASAL  RESPIRATORY PANEL BY PCR  CULTURE, RESPIRATORY W GRAM STAIN  URINALYSIS, W/ REFLEX TO CULTURE (INFECTION SUSPECTED)  BRAIN NATRIURETIC PEPTIDE  PHOSPHORUS  MAGNESIUM   MAGNESIUM   I-STAT CG4 LACTIC ACID, ED  TROPONIN I (HIGH SENSITIVITY)  TROPONIN I (HIGH SENSITIVITY)    EKG  EKG Interpretation Date/Time:  Monday June 24 2023 19:31:38 EDT Ventricular Rate:  123 PR Interval:  138 QRS Duration:  93 QT Interval:  324 QTC Calculation: 464 R Axis:   75  Text Interpretation: Sinus tachycardia Confirmed by Franklyn Gills [phone]) on 06/24/2023 8:20:35 PM  Radiology CXR 1. Scattered patchy opacities, suspicious for multifocal pneumonia.   Procedures .Critical Care  Performed by: Franklyn Gills SAILOR, MD Authorized by: Franklyn Gills SAILOR, MD   Critical care provider statement:    Critical care time (minutes):  35   Critical care was necessary to treat or prevent imminent or life-threatening deterioration of the following conditions:  Respiratory failure   Critical care was time spent personally by me on the following activities:  Development of treatment plan with patient or surrogate, discussions with consultants, evaluation of patient's response to treatment, examination of patient, ordering and review of laboratory studies, ordering and review of radiographic studies, ordering and performing treatments and interventions, pulse oximetry, re-evaluation of patient's condition, review of old charts, obtaining history from patient or surrogate and ventilator management   Care discussed with: admitting provider       Medications Ordered in ED Medications  ipratropium-albuterol  (DUONEB) 0.5-2.5 (3) MG/3ML nebulizer solution 3 mL (3 mLs  Nebulization Given 06/24/23 2009)  azithromycin  (ZITHROMAX ) 500 mg in sodium chloride  0.9 % 250 mL IVPB (0 mg Intravenous Stopped 06/24/23 2235)  cefTRIAXone  (ROCEPHIN ) 1 g in sodium chloride  0.9 % 100 mL IVPB (0 g Intravenous Stopped 06/24/23 2103)  methylPREDNISolone  sodium succinate (SOLU-MEDROL ) 125 mg/2 mL injection 125 mg (125 mg Intravenous Given 06/24/23 2135)    ED Course/ Medical Decision Making/ A&P                          Medical Decision Making Amount and/or Complexity of Data Reviewed Labs: ordered. Decision-making details documented in ED Course. Radiology: ordered. Decision-making details documented in ED Course.  Risk Prescription drug management. Decision regarding hospitalization.    This patient presents to the ED for concern of SOB, this involves an extensive number of treatment options, and is a complaint that carries with it a high risk of complications and morbidity.  I considered the following differential and admission for this acute, potentially life threatening condition.   MDM:    DDX for dyspnea includes but is not limited to:  Patient with acute on chronic hypoxic respiratory failure.  He was hypoxic on EMS arrival to 80% and placed on a nonrebreather.  Likely COPD exacerbation started on high flow nasal cannula on arrival to 12 L/min and given several DuoNebs as well as Solu-Medrol  which do improve his symptoms.  His chest x-ray demonstrates multifocal pneumonia and he is given ceftriaxone  azithromycin .  He does report some chest pain which could be attributed to the pneumonia, and his EKG does not demonstrate signs of ischemia and his troponin is negative reassuringly.  He has not had any asymmetric leg swelling that would indicate PE, lower concern in clinical setting, had recent CTA on 610 that was negative for PE.  BNP is within normal limits.  No lower extremity indicate heart failure exacerbation.  WBC 8.  Lactate 1.2.  Flu COVID RSV  negative.    Clinical Course as of 06/27/23 1442  Mon Jun 24, 2023  2020 DG Chest Walker 1 View 1. Scattered patchy opacities, suspicious for multifocal pneumonia. [HN]  2026 O2 Flow Rate (L/min): 12 L/min [HN]  2106 Troponin I (High Sensitivity):  9 neg [HN]    Clinical Course User Index [HN] Franklyn Sid SAILOR, MD    Labs: I Ordered, and personally interpreted labs.  The pertinent results include: Those listed above  Imaging Studies ordered: I ordered imaging studies including chest x-ray I independently visualized and interpreted imaging. I agree with the radiologist interpretation  Additional history obtained from chart review.   Cardiac Monitoring: The patient was maintained on a cardiac monitor.  I personally viewed and interpreted the cardiac monitored which showed an underlying rhythm of: Sinus tachycardia  Reevaluation: After the interventions noted above, I reevaluated the patient and found that they have :improved  Social Determinants of Health:  lives independently  Disposition: Admitted to medicine  Co morbidities that complicate the patient evaluation  Past Medical History:  Diagnosis Date   COPD (chronic obstructive pulmonary disease) (HCC)    High cholesterol    Hypertension      Medicines Meds ordered this encounter  Medications   ipratropium-albuterol  (DUONEB) 0.5-2.5 (3) MG/3ML nebulizer solution 3 mL    I have reviewed the patients home medicines and have made adjustments as needed  Problem List / ED Course: Problem List Items Addressed This Visit       Respiratory   * (Principal) Multifocal pneumonia   Relevant Medications   albuterol  (PROVENTIL ) (2.5 MG/3ML) 0.083% nebulizer solution 2.5 mg   ipratropium-albuterol  (DUONEB) 0.5-2.5 (3) MG/3ML nebulizer solution 3 mL   cefTRIAXone  (ROCEPHIN ) 1 g in sodium chloride  0.9 % 100 mL IVPB   budesonide -glycopyrrolate -formoterol  (BREZTRI ) 160-9-4.8 MCG/ACT inhaler 2 puff   Other Visit Diagnoses        COPD exacerbation (HCC)    -  Primary   Relevant Medications   ipratropium-albuterol  (DUONEB) 0.5-2.5 (3) MG/3ML nebulizer solution 3 mL (Completed)   azithromycin  (ZITHROMAX ) 500 mg in sodium chloride  0.9 % 250 mL IVPB (Completed)   methylPREDNISolone  sodium succinate (SOLU-MEDROL ) 125 mg/2 mL injection 125 mg (Completed)   albuterol  (PROVENTIL ) (2.5 MG/3ML) 0.083% nebulizer solution 2.5 mg   ipratropium-albuterol  (DUONEB) 0.5-2.5 (3) MG/3ML nebulizer solution 3 mL   methylPREDNISolone  sodium succinate (SOLU-MEDROL ) 125 mg/2 mL injection 60 mg   azithromycin  (ZITHROMAX ) tablet 500 mg (Completed)   budesonide -glycopyrrolate -formoterol  (BREZTRI ) 160-9-4.8 MCG/ACT inhaler 2 puff                   This note was created using dictation software, which may contain spelling or grammatical errors.    Franklyn Sid SAILOR, MD 06/27/23 [phone]

## 2023-06-24 NOTE — ED Triage Notes (Signed)
 Pt presents to ED from home via EMS for SOB for over a month. Hx of COPD pt states is worsening. Pt has increased baseline oxygen  from 3L to 5L. Pt was 80% on 5L when EMS arrived- placed on 10L NRB.

## 2023-06-25 DIAGNOSIS — E871 Hypo-osmolality and hyponatremia: Secondary | ICD-10-CM | POA: Diagnosis present

## 2023-06-25 DIAGNOSIS — J189 Pneumonia, unspecified organism: Secondary | ICD-10-CM | POA: Diagnosis not present

## 2023-06-25 LAB — CBC
HCT: 38.8 % — ABNORMAL LOW (ref 39.0–52.0)
Hemoglobin: 12.6 g/dL — ABNORMAL LOW (ref 13.0–17.0)
MCH: 35.5 pg — ABNORMAL HIGH (ref 26.0–34.0)
MCHC: 32.5 g/dL (ref 30.0–36.0)
MCV: 109.3 fL — ABNORMAL HIGH (ref 80.0–100.0)
Platelets: 119 10*3/uL — ABNORMAL LOW (ref 150–400)
RBC: 3.55 MIL/uL — ABNORMAL LOW (ref 4.22–5.81)
RDW: 15 % (ref 11.5–15.5)
WBC: 5.7 10*3/uL (ref 4.0–10.5)
nRBC: 0 % (ref 0.0–0.2)

## 2023-06-25 LAB — RESPIRATORY PANEL BY PCR

## 2023-06-25 LAB — URINALYSIS, W/ REFLEX TO CULTURE (INFECTION SUSPECTED)
Bacteria, UA: NONE SEEN
Bilirubin Urine: NEGATIVE
Glucose, UA: NEGATIVE mg/dL
Hgb urine dipstick: NEGATIVE
Ketones, ur: NEGATIVE mg/dL
Leukocytes,Ua: NEGATIVE
Nitrite: NEGATIVE
Protein, ur: NEGATIVE mg/dL
Specific Gravity, Urine: 1.013 (ref 1.005–1.030)
pH: 5 (ref 5.0–8.0)

## 2023-06-25 LAB — BASIC METABOLIC PANEL WITH GFR
Anion gap: 9 (ref 5–15)
BUN: 12 mg/dL (ref 8–23)
CO2: 29 mmol/L (ref 22–32)
Calcium: 8.3 mg/dL — ABNORMAL LOW (ref 8.9–10.3)
Chloride: 96 mmol/L — ABNORMAL LOW (ref 98–111)
Creatinine, Ser: 0.58 mg/dL — ABNORMAL LOW (ref 0.61–1.24)
GFR, Estimated: >60 mL/min (ref >60)
Glucose, Bld: 168 mg/dL — ABNORMAL HIGH (ref 70–99)
Potassium: 4.3 mmol/L (ref 3.5–5.1)
Sodium: 134 mmol/L — ABNORMAL LOW (ref 135–145)

## 2023-06-25 LAB — MAGNESIUM: Magnesium: 1.5 mg/dL — ABNORMAL LOW (ref 1.7–2.4)

## 2023-06-25 LAB — MRSA NEXT GEN BY PCR, NASAL: MRSA by PCR Next Gen: NOT DETECTED

## 2023-06-25 LAB — PHOSPHORUS: Phosphorus: 3 mg/dL (ref 2.5–4.6)

## 2023-06-25 MED ORDER — ORAL CARE MOUTH RINSE
15.0000 mL | OROMUCOSAL | Status: DC
  Administered 2023-06-25 – 2023-06-26 (×4): 15 mL via OROMUCOSAL

## 2023-06-25 MED ORDER — ORAL CARE MOUTH RINSE
15.0000 mL | OROMUCOSAL | Status: DC | PRN
Start: 2023-06-25 — End: 2023-06-26

## 2023-06-25 MED ORDER — MAGNESIUM SULFATE 2 GM/50ML IV SOLN
2.0000 g | Freq: Once | INTRAVENOUS | Status: AC
  Administered 2023-06-25: 2 g via INTRAVENOUS
  Filled 2023-06-25: qty 50

## 2023-06-25 NOTE — Progress Notes (Signed)
 Flutter not given pt on continuous bipap at this time.

## 2023-06-25 NOTE — Hospital Course (Addendum)
 [name] [name]

## 2023-06-25 NOTE — Progress Notes (Signed)
 Pt taken off bipap and placed on 4L [place].  Pt is tolerating well.  Sats 96%, HR 94, RR 20.  RN updated.

## 2023-06-25 NOTE — Plan of Care (Signed)

## 2023-06-25 NOTE — H&P (Signed)
 History and Physical    [name] [name] WJX:[id number] DOB: 1945/02/14 DOA: 06/24/2023  PCP: Lanae Pinal, MD   Patient coming from: Home   Chief Complaint:  Chief Complaint  Patient presents with   Shortness of Breath    HPI: History limited due to acuity / resp distress  [name] [name] is a 78 y.o. 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 Reports that he has been dealing with SOB with minimal exertion over the past approximate month. However gradually worsening until prior to arrival had severe SOB even at rest, desaturating and required increase in his home O2 -> 5L. See additional EMS course, found hypoxic on arrival and placed on NRB.   Otherwise reports he has had recent cough over past week which is minimally productive with clear, sometimes scant yellow sputum. No other URI symptoms. No sick contacts. He has been experiencing intermittent chest pain and ultimately had a recent CTA PE on 6/10 which was negative for PE and there was no airspace disease at that time. Otherwise denies recent illness.     Review of Systems:  ROS  limited due to acuity / resp distress   Allergies  Allergen Reactions   Fluticasone -Salmeterol Shortness Of Breath and Other (See Comments)    Worsening shortness of breath..    Prior to Admission medications   Medication Sig Start Date End Date Taking? Authorizing Provider  albuterol  (VENTOLIN  HFA) 108 (90 Base) MCG/ACT inhaler Inhale 2 puffs into the lungs every 6 (six) hours as needed for wheezing or shortness of breath. Patient taking differently: Inhale 2 puffs into the lungs every 4 (four) hours as needed for shortness of breath or wheezing. 12/08/21  Yes Lind Repine, MD  ALPRAZolam  (XANAX ) 0.25 MG tablet Take 0.25 mg by mouth daily as needed for sleep or anxiety.   Yes [provider]  amLODipine  (NORVASC ) 5 MG tablet  Take 5 mg by mouth daily. 11/23/22  Yes [provider]  atorvastatin  (LIPITOR) 20 MG tablet Take 20 mg by mouth daily. 07/01/22  Yes [provider]  Budeson-Glycopyrrol-Formoterol  (BREZTRI  AEROSPHERE) 160-9-4.8 MCG/ACT AERO Inhale 2 puffs into the lungs in the morning and at bedtime. 02/11/23  Yes Antonio Baumgarten, NP  folic acid  (FOLVITE ) 1 MG tablet Take 1 mg by mouth daily. 11/01/22  Yes [provider]  furosemide  (LASIX ) 40 MG tablet Take 40 mg by mouth in the morning. 03/06/22  Yes [provider]  ipratropium-albuterol  (DUONEB) 0.5-2.5 (3) MG/3ML SOLN Take 3 mLs by nebulization daily. 03/29/23  Yes [provider]  methocarbamol  (ROBAXIN ) 500 MG tablet Take 1 tablet (500 mg total) by mouth every 8 (eight) hours as needed for muscle spasms. 03/24/23  Yes Modena Andes, MD  methotrexate  50 MG/2ML injection Inject 20 mg into the skin every Sunday.   Yes [provider]  Multiple Vitamins-Minerals (MULTIVITAMIN WITH MINERALS) tablet Take 1 tablet by mouth daily with breakfast.   Yes [provider]  naproxen (NAPROSYN) 500 MG tablet Take 500 mg by mouth 2 (two) times daily with a meal.   Yes [provider]  omeprazole (PRILOSEC OTC) 20 MG tablet Take 20 mg by mouth daily before breakfast.   Yes [provider]  OXYGEN  Inhale 4 L/min into the lungs continuous.   Yes [provider]  polyethylene glycol (MIRALAX  / GLYCOLAX ) 17 g packet Take 17  g by mouth daily as needed for mild constipation. 07/09/22  Yes Shalhoub, Merrill Abide, MD  Potassium Chloride  ER 20 MEQ TBCR Take 20 mEq by mouth daily. 02/02/22  Yes [provider]  sertraline  (ZOLOFT ) 100 MG tablet Take 100 mg by mouth daily. 02/22/23  Yes [provider]  TYLENOL  500 MG tablet Take 500 mg by mouth every 8 (eight) hours as needed for mild pain (pain score 1-3) or headache.   Yes [provider]  vitamin B-12 (CYANOCOBALAMIN ) 1000 MCG  tablet Take 1,000 mcg by mouth daily.   Yes [provider]  hydrOXYzine  (ATARAX ) 25 MG tablet TAKE 1 TABLET BY MOUTH EVERY 8 HOURS AS NEEDED FOR ANXIETY. Patient not taking: Reported on 06/24/2023 11/23/22   Antonio Baumgarten, NP  predniSONE  (DELTASONE ) 10 MG tablet 4 tabs x 5 days Patient not taking: Reported on 06/24/2023 06/14/23   Antonio Baumgarten, NP    Past Medical History:  Diagnosis Date   COPD (chronic obstructive pulmonary disease) (HCC)    High cholesterol    Hypertension     Past Surgical History:  Procedure Laterality Date   NASAL SINUS SURGERY  2007     reports that he quit smoking about 15 years ago. His smoking use included cigarettes. He started smoking about 55 years ago. He has a 80 pack-year smoking history. He has never been exposed to tobacco smoke. He has never used smokeless tobacco. He reports current alcohol use of about 12.0 standard drinks of alcohol per week. He reports that he does not use drugs.  Family History  Problem Relation Age of Onset   Esophageal cancer Father      Physical Exam: Vitals:   06/24/23 2200 06/24/23 2244 06/24/23 2326 06/24/23 2334  BP: (!) 142/76     Pulse: (!) 131 (!) 120    Resp: (!) 26 (!) 41    Temp:   98.3 F (36.8 C)   TempSrc:   Axillary   SpO2: 92% 99%  100%  Weight:      Height:        Gen: Awake, alert, in respiratory distress   CV: Regular, normal S1, S2, no murmurs  Resp: In respiratory distress, increased WOB with + accessory muscle use, tachypneic in 30-40s, speaking in 1-2 word sentences, desaturation with speaking. There is diffuse expiratory wheezes and very poor air movement.  Abd: Round, normoactive, nontender MSK: Symmetric, no edema  Skin: Venous stasis changes over lower ext, scattered ecchymosis over the arms. No other rashes or lesions to exposed skin  Neuro: Alert and interactive  Psych: in distress 2/2 respiratory condition    Data review:   Labs reviewed, notable for:   Na  131  K 3.2  Bicarb 31  HS trop 9  Lactate 1.2  WBC 8  Macrocytosis without anemia   Micro:  Results for orders placed or performed during the hospital encounter of 06/24/23  Resp panel by RT-PCR (RSV, Flu A&B, Covid) Anterior Nasal Swab     Status: None   Collection Time: 06/24/23  8:02 PM   Specimen: Anterior Nasal Swab  Result Value Ref Range Status   SARS Coronavirus 2 by RT PCR NEGATIVE NEGATIVE Final    Comment: (NOTE) SARS-CoV-2 target nucleic acids are NOT DETECTED.  The SARS-CoV-2 RNA is generally detectable in upper respiratory specimens during the acute phase of infection. The lowest concentration of SARS-CoV-2 viral copies this assay can detect is 138 copies/mL. A negative result does not  preclude SARS-Cov-2 infection and should not be used as the sole basis for treatment or other patient management decisions. A negative result may occur with  improper specimen collection/handling, submission of specimen other than nasopharyngeal swab, presence of viral mutation(s) within the areas targeted by this assay, and inadequate number of viral copies(<138 copies/mL). A negative result must be combined with clinical observations, patient history, and epidemiological information. The expected result is Negative.  Fact Sheet for Patients:  BloggerCourse.com  Fact Sheet for Healthcare Providers:  SeriousBroker.it  This test is no t yet approved or cleared by the [place]  FDA and  has been authorized for detection and/or diagnosis of SARS-CoV-2 by FDA under an Emergency Use Authorization (EUA). This EUA will remain  in effect (meaning this test can be used) for the duration of the COVID-19 declaration under Section 564(b)(1) of the Act, 21 U.S.C.section 360bbb-3(b)(1), unless the authorization is terminated  or revoked sooner.       Influenza A by PCR NEGATIVE NEGATIVE Final   Influenza B by PCR NEGATIVE NEGATIVE  Final    Comment: (NOTE) The Xpert Xpress SARS-CoV-2/FLU/RSV plus assay is intended as an aid in the diagnosis of influenza from Nasopharyngeal swab specimens and should not be used as a sole basis for treatment. Nasal washings and aspirates are unacceptable for Xpert Xpress SARS-CoV-2/FLU/RSV testing.  Fact Sheet for Patients: BloggerCourse.com  Fact Sheet for Healthcare Providers: SeriousBroker.it  This test is not yet approved or cleared by the [place]  FDA and has been authorized for detection and/or diagnosis of SARS-CoV-2 by FDA under an Emergency Use Authorization (EUA). This EUA will remain in effect (meaning this test can be used) for the duration of the COVID-19 declaration under Section 564(b)(1) of the Act, 21 U.S.C. section 360bbb-3(b)(1), unless the authorization is terminated or revoked.     Resp Syncytial Virus by PCR NEGATIVE NEGATIVE Final    Comment: (NOTE) Fact Sheet for Patients: BloggerCourse.com  Fact Sheet for Healthcare Providers: SeriousBroker.it  This test is not yet approved or cleared by the [place]  FDA and has been authorized for detection and/or diagnosis of SARS-CoV-2 by FDA under an Emergency Use Authorization (EUA). This EUA will remain in effect (meaning this test can be used) for the duration of the COVID-19 declaration under Section 564(b)(1) of the Act, 21 U.S.C. section 360bbb-3(b)(1), unless the authorization is terminated or revoked.  Performed at [facility], [address] [place], [place] [address]   MRSA Next Gen by PCR, Nasal     Status: None   Collection Time: 06/24/23 11:37 PM   Specimen: Nasal Mucosa; Nasal Swab  Result Value Ref Range Status   MRSA by PCR Next Gen NOT DETECTED NOT DETECTED Final    Comment: (NOTE) The GeneXpert MRSA Assay (FDA approved for NASAL specimens only), is one  component of a comprehensive MRSA colonization surveillance program. It is not intended to diagnose MRSA infection nor to guide or monitor treatment for MRSA infections. Test performance is not FDA approved in patients less than 34 years old. Performed at [facility], [address] [place], [place] [address]     Imaging reviewed:  [facility] Chest Port 1 View Result Date: 06/24/2023 EXAM: 1 VIEW XRAY OF THE CHEST 06/24/2023 07:45:00 PM COMPARISON: CT chest dated 06/18/2023. CLINICAL HISTORY: SOB hypoxia. SOB/hypoxia - Pt presents to ED from home via EMS for SOB for over a month. Hx of COPD pt states is worsening. Pt has increased baseline oxygen  from  3L to 5L. Pt was 80% on 5L when EMS arrived- placed on 10L NRB. FINDINGS: LUNGS AND PLEURA: Coarse interstitial markings and emphysematous changes. Superimposed patchy opacities in the left mid lung and right lower lung, suggesting multifocal pneumonia. No pleural effusion. No pneumothorax. HEART AND MEDIASTINUM: No acute abnormality of the cardiac and mediastinal silhouettes. Thoracic aortic atherosclerosis. BONES AND SOFT TISSUES: No acute osseous abnormality. IMPRESSION: 1. Scattered patchy opacities, suspicious for multifocal pneumonia. Electronically signed by: Zadie Herter MD 06/24/2023 07:50 PM EDT RP Workstation: [id number]   Personally reviewed CXR: ; agree with radiology interpretation above     EKG:  Personally reviewed, sinus tachycardia, borderline LAE, no acute ischemic changes.    EMS/ ED Course:  On EMS arrival hypoxic to 80% on 5L o2 and placed on NRB. Was later placed on midflow [place] 12L but due to respiratory distress on my evaluation transitioned to BiPAP. Treated with Methylprednisolone , Ceftriaxone , azithromycin , nebs in Ed    Assessment/Plan:  78 y.o. 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 Found to have  acute on chronic hypoxic respiratory failure in the setting of multifocal pneumonia and associated COPD exacerbation.   Acute on chronic hypoxic respiratory failure, requiring BiPAP  Multifocal pneumonia COPD exacerbation, severe  SIRS secondary to above; low concern for sepsis  Hx progressive SOB x 1 month, new cough x 1 week, and acute decompensation just PTA. Hypoxic on EMS arrival to 80% placed on NRB and then transitioned to midflow 12L but due to respiratory distress and work of breathing placed on BiPAP after my evaluation. Tachycardic into 130s -> improved to 100s once on BiPAP. Afebrile. Exam with diminished air movement and wheezing. WBC 8. Lactate 1.2. Flu/COVID/RSV neg. VBG after starting BiPAP 7.4 / 59. Had recent CTA on 6/10 to evaluate recent chest pain which was negative for PE, and there was no airspace disease at that time. CXR in ED with new onset of bilateral patchy airspace disease in the Left mid and right lower lung zones.  - Continuous BiPAP  - For now continue on Ceftriaxone  1 g IV daily,, azithromycin  500 mg daily; if fails to improve or worsening would broaden to Cefepime for pseudomonas with his recent hospitalization within 3 months and hx immunosuppresion.  -Continue methylprednisolone  60 mg IV every 12 hours until breathing stabilized then transition to Prednisone  to complete course  -Check MRSA nares, RVP, sputum culture -Continue home Breztri , Spiriva, DuoNebs every 6 hours scheduled, albuterol  every 4 hours as needed, incentive spirometer, flutter valve, encourage out of bed to chair. -Home O2 evaluation prior to discharge -Consider referral to pulmonary rehab  Hx Chest pain  Hx intermittent chest pain over past month. EKG without acute ischemic changes. HS trop negative x2. Recent CTA negative for PE outpatient. ? MSK related  -Symptomatic management Tylenol  as needed, NSAIDs as needed  Hyponatremia, mild asymptomatic ? Hypovolemic low solute, ? SIADH with pulm  pathology  -Trend BMP, can eval further with sOsm, uOsm, uNA if Na worsening.   Hypokalemia  Repleted   Chronic medical problems:  RA: On methotrexate  injection weekly. Continue folate  HFpEF, without acute exacerbation: Euvolemic by exam, BNP 35. Continue home Lasix  40 mg daily to maintain euvolemia  CAD by imaging/ HLD: continue home statin  HTN: Hold home Amlodipine  in setting of acute illness  Mood d/o: Continue home xanax  prn, sertraline .    Body mass  index is 28.17 kg/m.    DVT prophylaxis:  Lovenox  Code Status:  Full Code Diet:  Diet Orders (From admission, onward)     Start     Ordered   06/24/23 2233  Diet NPO time specified Except for: Sips with Meds, Other (See Comments)  Diet effective now       Comments: Sips clears  Question Answer Comment  Except for Sips with Meds   Except for Other (See Comments)      06/24/23 2236           Family Communication:  None   Consults:  None   Admission status:   Inpatient, Step Down Unit  Severity of Illness: The appropriate patient status for this patient is INPATIENT. Inpatient status is judged to be reasonable and necessary in order to provide the required intensity of service to ensure the patient's safety. The patient's presenting symptoms, physical exam findings, and initial radiographic and laboratory data in the context of their chronic comorbidities is felt to place them at high risk for further clinical deterioration. Furthermore, it is not anticipated that the patient will be medically stable for discharge from the hospital within 2 midnights of admission.   * I certify that at the point of admission it is my clinical judgment that the patient will require inpatient hospital care spanning beyond 2 midnights from the point of admission due to high intensity of service, high risk for further deterioration and high frequency of surveillance required.*   Arnulfo Larch, MD [facility]  How to contact the  [facility] Attending or Consulting provider 7A - 7P or covering provider during after hours 7P -7A, for this patient.  Check the care team in [facility] and look for a) attending/consulting TR[facility] provider listed and b) the TRH team listed Log into [email or web address] and use [facility]'s universal password to access. If you do not have the password, please contact the hospital operator. Locate the TR[facility] provider you are looking for under [facility] and page to a number that you can be directly reached. If you still have difficulty reaching the provider, please page the [facility] (Director on Call) for the Hospitalists listed on amion for assistance.  06/25/2023, 12:57 AM

## 2023-06-25 NOTE — Progress Notes (Addendum)
 PROGRESS NOTE    [name] [name]  HYQ:[id number] DOB: Jan 08, 1946 DOA: 06/24/2023 PCP: Lanae Pinal, MD    Brief Narrative:  [name] [name] is a 78 y.o. male with past medical history of severe COPD, chronic hypoxic respiratory failure on 4 L of oxygen  at baseline, rheumatoid arthritis, heart failure with preserved ejection fraction, CAD, hypertension, hyperlipidemia, mood disorder presented to hospital with progressive shortness of breath and dyspnea on exertion.  He had progressive dyspnea at the point that he was short of breath at rest and was desaturating and needed his oxygen  to be bumped up to 5 L.  EMS was called and patient was brought into the hospital on nonrebreather mask.  Has been having cough minimally productive for a week.  Patient was then admitted hospital for further evaluation and treatment     Assessment and Plan: Principal Problem:   Multifocal pneumonia Active Problems:   Acute on chronic hypoxic respiratory failure (HCC)   COPD with acute exacerbation (HCC)   Hyponatremia   Acute on chronic hypoxic respiratory failure, requiring BiPAP  Multifocal pneumonia COPD exacerbation, severe  SIRS secondary to above; low concern for sepsis  Hx progressive SOB x 1 month, new cough x 1 week, and acute decompensation just PTA. Hypoxic on EMS arrival to 80% placed on NRB and then transitioned to midflow 12L but due to respiratory distress and work of breathing was placed on BiPAP subsequently.  Initially tachycardic.  Lactate 1.2.  Flu COVID RSV is negative.  Recent CT on 1625 showed negative for PE.  Chest x-ray in the ED showed bilateral patchy airspace opacities in the left mid and right lower lung  zones.  Continue BiPAP.  Continue Rocephin  and Zithromax .  Continue Solu-Medrol  every hourly.  Continue home Breztri , Spiriva, DuoNebs every 6 hours scheduled, albuterol  every 4 hours as needed, incentive spirometer, flutter valve, encourage out of bed to chair.  Blood  cultures negative in less than 12 hours.  Will need home oxygen  evaluation prior to discharge.  Consider pulmonary rehab referral on discharge.   Atypical chest pain  Hx intermittent chest pain over past month. EKG without acute ischemic changes. HS trop negative x2. Recent CTA negative for PE outpatient.  Atypical musculoskeletal related.  Continue symptomatic care.   Hyponatremia, mild asymptomatic Will continue to monitor closely.  Latest sodium of 134.   Hypokalemia  improved.  Latest potassium of 4.3.  Hypomagnesemia.  Latest magnesium  1.5.  Will replace.  RA: On methotrexate  injection weekly. Continue folate   HFpEF, without acute exacerbation: Euvolemic by exam, BNP 35.  Continue Lasix  daily.    CAD by imaging/ Hyperlipidemia continue home statin   HTN: Amlodipine  on hold.  Mood d/o: Continue Xanax  as needed sertraline .     DVT prophylaxis: enoxaparin  (LOVENOX ) injection 40 mg Start: 06/25/23 1000   Code Status:     Code Status: Full Code  Disposition: Likely home with home health in 1 to 2 days  Status is: Inpatient Remains inpatient appropriate because: Respiratory distress on BiPAP, pending clinical improvement, IV antibiotics   Family Communication: None at bedside.  Spoke with the patient's daughter on the phone and updated her about the clinical condition of the patient.  Consultants:  None.  Procedures:  BiPAP  Antimicrobials:  Rocephin  Zithromax   Anti-infectives (From admission, onward)    Start     Dose/Rate Route Frequency Ordered Stop   06/25/23 2200  azithromycin  (ZITHROMAX ) tablet 500 mg        500 mg  Oral Daily at bedtime 06/24/23 2236 06/27/23 2159   06/25/23 2100  cefTRIAXone  (ROCEPHIN ) 1 g in sodium chloride  0.9 % 100 mL IVPB        1 g 200 mL/hr over 30 Minutes Intravenous Every 24 hours 06/24/23 2236 06/29/23 2059   06/24/23 2030  azithromycin  (ZITHROMAX ) 500 mg in sodium chloride  0.9 % 250 mL IVPB        500 mg 250 mL/hr over 60  Minutes Intravenous  Once 06/24/23 2025 06/24/23 2235   06/24/23 2030  cefTRIAXone  (ROCEPHIN ) 1 g in sodium chloride  0.9 % 100 mL IVPB        1 g 200 mL/hr over 30 Minutes Intravenous  Once 06/24/23 2025 06/24/23 2103        Subjective: Today, patient was seen and examined at bedside.  Still on BiPAP with moderate respite distress but able to verbalize.  Denies any pain has some cough which is mostly dry.  Complains of mild chest wall and back pain.  Denies any nausea vomiting fever chills or rigor.  Objective: Vitals:   06/25/23 1000 06/25/23 1017 06/25/23 1019 06/25/23 1100  BP: (!) 156/71   (!) 150/75  Pulse: 95 (!) 116 (!) 108 100  Resp: (!) 30 (!) 27 (!) 23 (!) 41  Temp:      TempSrc:      SpO2: 91% (!) 78% 94% 100%  Weight:      Height:        Intake/Output Summary (Last 24 hours) at 06/25/2023 1120 Last data filed at 06/25/2023 1034 Gross per 24 hour  Intake 592.2 ml  Output 1230 ml  Net -637.8 ml   Filed Weights   06/24/23 2042 06/25/23 0134  Weight: 91.6 kg 95.6 kg    Physical Examination: Body mass index is 29.4 kg/m.   General:  Average built, elderly male, on BiPAP from moderate respiratory distress. HENT:   No scleral pallor or icterus noted. Oral mucosa is moist.  Chest: Diminished breath sounds bilaterally.  Coarse breath sounds noted tachypnea CVS: S1 &S2 heard. No murmur.  Regular rate and rhythm. Abdomen: Soft, nontender, nondistended.  Bowel sounds are heard.   Extremities: No cyanosis, clubbing but with venous stasis changes over the lower extremities, peripheral pulses are palpable. Psych: Alert, awake and oriented, slightly anxious CNS:  No cranial nerve deficits.  Power equal in all extremities.   Skin: Warm and dry.  No rashes noted.  Data Reviewed:   CBC: Recent Labs  Lab 06/24/23 1930 06/24/23 1943 06/25/23 0312  WBC 8.2  --  5.7  NEUTROABS 6.4  --   --   HGB 13.1 14.3 12.6*  HCT 39.8 42.0 38.8*  MCV 106.4*  --  109.3*  PLT 141*   --  119*    Basic Metabolic Panel: Recent Labs  Lab 06/24/23 1930 06/24/23 1943 06/25/23 0312  NA 131* 133* 134*  K 3.2* 3.4* 4.3  CL 88* 86* 96*  CO2 31  --  29  GLUCOSE 124* 123* 168*  BUN 13 12 12   CREATININE 0.77 0.80 0.58*  CALCIUM  8.3*  --  8.3*  MG  --   --  1.5*  PHOS  --   --  3.0    Liver Function Tests: Recent Labs  Lab 06/24/23 1930  AST 22  ALT 16  ALKPHOS 107  BILITOT 0.7  PROT 6.7  ALBUMIN 3.1*     Radiology Studies: DG Chest Port 1 View Result Date: 06/24/2023 EXAM: 1 VIEW  XRAY OF THE CHEST 06/24/2023 07:45:00 PM COMPARISON: CT chest dated 06/18/2023. CLINICAL HISTORY: SOB hypoxia. SOB/hypoxia - Pt presents to ED from home via EMS for SOB for over a month. Hx of COPD pt states is worsening. Pt has increased baseline oxygen  from 3L to 5L. Pt was 80% on 5L when EMS arrived- placed on 10L NRB. FINDINGS: LUNGS AND PLEURA: Coarse interstitial markings and emphysematous changes. Superimposed patchy opacities in the left mid lung and right lower lung, suggesting multifocal pneumonia. No pleural effusion. No pneumothorax. HEART AND MEDIASTINUM: No acute abnormality of the cardiac and mediastinal silhouettes. Thoracic aortic atherosclerosis. BONES AND SOFT TISSUES: No acute osseous abnormality. IMPRESSION: 1. Scattered patchy opacities, suspicious for multifocal pneumonia. Electronically signed by: Zadie Herter MD 06/24/2023 07:50 PM EDT RP Workstation: [id number]      LOS: 1 day     Rosena Conradi, MD [facility] Available via Epic secure chat 7am-7pm After these hours, please refer to coverage provider listed on [email or web address] 06/25/2023, 11:20 AM

## 2023-06-25 NOTE — Plan of Care (Signed)
  Problem: Clinical Measurements: Goal: Respiratory complications will improve Outcome: Progressing   Problem: Nutrition: Goal: Adequate nutrition will be maintained Outcome: Progressing   Problem: Coping: Goal: Level of anxiety will decrease Outcome: Progressing   Problem: Elimination: Goal: Will not experience complications related to urinary retention Outcome: Progressing   Problem: Pain Managment: Goal: General experience of comfort will improve and/or be controlled Outcome: Progressing   Problem: Skin Integrity: Goal: Risk for impaired skin integrity will decrease Outcome: Progressing

## 2023-06-25 NOTE — Plan of Care (Signed)
  Problem: Clinical Measurements: Goal: Will remain free from infection Outcome: Not Progressing Goal: Respiratory complications will improve Outcome: Not Progressing   Problem: Activity: Goal: Risk for activity intolerance will decrease Outcome: Not Progressing   Problem: Elimination: Goal: Will not experience complications related to urinary retention Outcome: Not Progressing

## 2023-06-26 DIAGNOSIS — J189 Pneumonia, unspecified organism: Secondary | ICD-10-CM | POA: Diagnosis not present

## 2023-06-26 LAB — CBC
HCT: 38.4 % — ABNORMAL LOW (ref 39.0–52.0)
Hemoglobin: 12.5 g/dL — ABNORMAL LOW (ref 13.0–17.0)
MCH: 35.5 pg — ABNORMAL HIGH (ref 26.0–34.0)
MCHC: 32.6 g/dL (ref 30.0–36.0)
MCV: 109.1 fL — ABNORMAL HIGH (ref 80.0–100.0)
Platelets: 132 10*3/uL — ABNORMAL LOW (ref 150–400)
RBC: 3.52 MIL/uL — ABNORMAL LOW (ref 4.22–5.81)
RDW: 14.9 % (ref 11.5–15.5)
WBC: 5.6 10*3/uL (ref 4.0–10.5)
nRBC: 0 % (ref 0.0–0.2)

## 2023-06-26 LAB — BASIC METABOLIC PANEL WITH GFR
Anion gap: 9 (ref 5–15)
BUN: 15 mg/dL (ref 8–23)
CO2: 31 mmol/L (ref 22–32)
Calcium: 8.7 mg/dL — ABNORMAL LOW (ref 8.9–10.3)
Chloride: 93 mmol/L — ABNORMAL LOW (ref 98–111)
Creatinine, Ser: 0.63 mg/dL (ref 0.61–1.24)
GFR, Estimated: >60 mL/min (ref >60)
Glucose, Bld: 188 mg/dL — ABNORMAL HIGH (ref 70–99)
Potassium: 4 mmol/L (ref 3.5–5.1)
Sodium: 133 mmol/L — ABNORMAL LOW (ref 135–145)

## 2023-06-26 LAB — EXPECTORATED SPUTUM ASSESSMENT W GRAM STAIN, RFLX TO RESP C

## 2023-06-26 LAB — MAGNESIUM: Magnesium: 2.1 mg/dL (ref 1.7–2.4)

## 2023-06-26 MED ORDER — BISACODYL 10 MG RE SUPP
10.0000 mg | Freq: Once | RECTAL | Status: AC
  Administered 2023-06-26: 10 mg via RECTAL
  Filled 2023-06-26: qty 1

## 2023-06-26 MED ORDER — ORAL CARE MOUTH RINSE: 15.0000 mL | OROMUCOSAL | Status: DC | PRN

## 2023-06-26 MED ORDER — LIDOCAINE 4 % EX CREA
TOPICAL_CREAM | Freq: Every day | CUTANEOUS | Status: DC | PRN
  Administered 2023-06-26 – 2023-06-27 (×2): 1 via TOPICAL
  Filled 2023-06-26 (×2): qty 5

## 2023-06-26 MED ORDER — MAGNESIUM CITRATE PO SOLN
0.5000 | Freq: Once | ORAL | Status: AC
  Administered 2023-06-26: 0.5 via ORAL
  Filled 2023-06-26: qty 296

## 2023-06-26 NOTE — TOC Initial Note (Signed)
 Transition of Care [facility]) - Initial/Assessment Note    Patient Details  Name: [name] [name] MRN: [id number] Date of Birth: [birth year]  Transition of Care [facility]) CM/SW Contact:    Tessie Fila, RN Phone Number: 06/26/2023, 2:22 PM  Clinical Narrative:                 Pt is from home. Pt is POC is daughter [name] [phone]. Pt states he uses 3-4L O2 via [place] at baseline. Pt states he has RW, Cane, Rollator, and W/C in the home. Pt is active with [facility] for HHPT services. Spoke with pt about recommendation for STR, and he is in agreement for NCM to send referrals for SNF placement. TOC following.    Expected Discharge Plan: Skilled Nursing Facility Barriers to Discharge: Continued Medical Work up   Patient Goals and CMS Choice Patient states their goals for this hospitalization and ongoing recovery are:: Go to SNF and after return home CMS Medicare.gov Compare Post Acute Care list provided to:: Patient Choice offered to / list presented to : Patient [place] ownership interest in [facility].provided to:: Patient    Expected Discharge Plan and Services   Discharge Planning Services: CM Consult Post Acute Care Choice: Skilled Nursing Facility Living arrangements for the past 2 months: Apartment                 DME Arranged: N/A DME Agency: Lawson Prey ([facility])       HH Arranged: NA HH Agency: Other - See comment ([facility])        Prior Living Arrangements/Services Living arrangements for the past 2 months: Apartment Lives with:: Adult Children Patient language and need for interpreter reviewed:: Yes Do you feel safe going back to the place where you live?: Yes      Need for Family Participation in Patient Care: No (Comment) Care giver support system in place?: No (comment) Current home services: DME, Home PT Criminal Activity/Legal Involvement Pertinent to Current Situation/Hospitalization: No - Comment as  needed  Activities of Daily Living   ADL Screening (condition at time of admission) Independently performs ADLs?: Yes (appropriate for developmental age) Is the patient deaf or have difficulty hearing?: No Does the patient have difficulty seeing, even when wearing glasses/contacts?: No Does the patient have difficulty concentrating, remembering, or making decisions?: No  Permission Sought/Granted Permission sought to share information with : Family Supports, Oceanographer granted to share information with : Yes, Verbal Permission Granted  Share Information with NAME: [name],[name] (Daughter)  [phone]  Permission granted to share info w AGENCY: [facility]        Emotional Assessment Appearance:: Appears stated age Attitude/Demeanor/Rapport: Engaged Affect (typically observed): Accepting Orientation: : Oriented to Self, Oriented to Place, Oriented to  Time, Oriented to Situation Alcohol / Substance Use: Not Applicable Psych Involvement: No (comment)  Admission diagnosis:  COPD exacerbation (HCC) [J44.1] Multifocal pneumonia [J18.9] Patient Active Problem List   Diagnosis Date Noted   Hyponatremia 06/25/2023   Multifocal pneumonia 06/24/2023   COPD with acute exacerbation (HCC) 03/29/2023   Acute on chronic diastolic CHF (congestive heart failure) (HCC) 03/29/2023   Pleural effusion 03/23/2023   Rib fracture 03/22/2023   Abnormal CT lung screening 11/12/2022   Rheumatoid arthritis (HCC) 11/12/2022   Constipation 07/08/2022   GERD without esophagitis 07/08/2022   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   Shortness of breath 07/06/2022   Acute on chronic hypoxic respiratory failure (HCC) 07/04/2022  Alcohol abuse 07/04/2022   Macrocytosis 07/04/2022   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 AT [address] OF [facility] [address] [place] [address] Phone: [phone] Fax: [phone]     Social Drivers of Health (SDOH) Social History: SDOH Screenings   Food Insecurity: No Food Insecurity (06/25/2023)  Housing: Low Risk  (06/25/2023)  Transportation Needs: No Transportation Needs (06/25/2023)  Utilities: Not At Risk (06/25/2023)  Depression (PHQ2-9): Low Risk  (03/09/2019)  Social Connections: Socially Isolated (06/25/2023)  Tobacco Use: Medium Risk (06/24/2023)   SDOH Interventions:     Readmission Risk Interventions    06/26/2023    2:14 PM 03/31/2023    3:55 PM 07/08/2022   10:55 AM  Readmission Risk Prevention Plan  Post Dischage Appt   Complete  Medication Screening   Complete  Transportation Screening Complete Complete Complete  PCP or Specialist Appt within 3-5 Days  Complete   HRI or Home Care Consult  Complete   Social Work Consult for Recovery Care Planning/Counseling  Complete   Palliative Care Screening  Not Applicable   Medication Review Oceanographer) Complete Complete   PCP or Specialist appointment within 3-5 days of discharge Complete    HRI or Home Care Consult Complete    SW Recovery Care/Counseling Consult Complete    Palliative Care Screening Not Applicable    Skilled Nursing Facility Complete

## 2023-06-26 NOTE — Evaluation (Signed)
 Occupational Therapy Evaluation Patient Details Name: [name] [name] MRN: [id number] DOB: 1945-05-12 Today's Date: 06/26/2023   History of Present Illness   [name] [name] is a 78 y.o. male presented 06/24/23 with progressive SOB requiring NRB mask. Found to have multifocal pneumonia, COPD exacerbation, hyponatremia. XBJ:YNWGNF COPD, chronic hypoxic respiratory failure on 4 L of oxygen  at baseline, rheumatoid arthritis, heart failure with preserved ejection fraction, CAD, hypertension, hyperlipidemia, mood disorder     Clinical Impressions Patient is a 78 year old male who was admitted for above. Patient was living at home with daughter support at [facility] level. Patient was noted to be very limited with SOB during session impacting participation in ADLs. Patient was noted to have decreased functional activity tolerance, decreased endurance, decreased standing balance, decreased safety awareness, and decreased knowledge of AD/AE impacting participation in ADLs. Patient would continue to benefit from skilled OT services at this time while admitted and after d/c to address noted deficits in order to improve overall safety and independence in ADLs.       If plan is discharge home, recommend the following:   A lot of help with bathing/dressing/bathroom;Assistance with cooking/housework;Direct supervision/assist for medications management;Assist for transportation;Help with stairs or ramp for entrance;Direct supervision/assist for financial management;A little help with walking and/or transfers     Functional Status Assessment   Patient has had a recent decline in their functional status and demonstrates the ability to make significant improvements in function in a reasonable and predictable amount of time.     Equipment Recommendations   None recommended by OT      Precautions/Restrictions   Precautions Precautions: Fall Restrictions Weight Bearing Restrictions Per Provider  Order: No     Mobility Bed Mobility Overal bed mobility: Needs Assistance Bed Mobility: Supine to Sit     Supine to sit: Contact guard     General bed mobility comments: increased time cues to not hold breath    Transfers                          Balance Overall balance assessment: Mild deficits observed, not formally tested                                         ADL either performed or assessed with clinical judgement   ADL Overall ADL's : Needs assistance/impaired Eating/Feeding: Modified independent;Sitting   Grooming: Sitting;Modified independent   Upper Body Bathing: Sitting;Contact guard assist   Lower Body Bathing: Sitting/lateral leans;Minimal assistance Lower Body Bathing Details (indicate cue type and reason): able to reach feet but very fatiguing for SOB to attempt task. Upper Body Dressing : Contact guard assist;Sitting   Lower Body Dressing: Sitting/lateral leans;Minimal assistance   Toilet Transfer: Stand-pivot;Rolling walker (2 wheels);Minimal assistance;+2 for safety/equipment Toilet Transfer Details (indicate cue type and reason): with increased SOB noted with attempt. patient needing over 2 mins for deep breathing. Toileting- Clothing Manipulation and Hygiene: Sitting/lateral lean;Maximal assistance               Vision Patient Visual Report: No change from baseline              Pertinent Vitals/Pain Pain Assessment Pain Assessment: Faces Faces Pain Scale: Hurts a little bit Pain Location: abdomen     Extremity/Trunk Assessment Upper Extremity Assessment Upper Extremity Assessment: Overall WFL for tasks assessed   Lower Extremity  Assessment Lower Extremity Assessment: Generalized weakness   Cervical / Trunk Assessment Cervical / Trunk Assessment: Kyphotic   Communication Communication Communication: No apparent difficulties   Cognition Arousal: Alert Behavior During Therapy: WFL for tasks  assessed/performed Cognition: No apparent impairments         Following commands: Intact                  Home Living Family/patient expects to be discharged to:: Private residence Living Arrangements: Children Available Help at Discharge: Family;Available PRN/intermittently Type of Home: Apartment Home Access: Level entry     Home Layout: One level     Bathroom Shower/Tub: Chief Strategy Officer: Handicapped height     Home Equipment: Grab bars - toilet;Grab bars - tub/shower;Cane - single Librarian, academic (2 wheels);Rollator (4 wheels);Shower seat;Hand held shower head;Adaptive equipment;BSC/3in1 Adaptive Equipment: Reacher Additional Comments: on 3L home O2; daughter has been staying with family but she works, children are local and can help at times      Prior Functioning/Environment Prior Level of Function : Independent/Modified Independent;Driving             Mobility Comments: Uses SPC primarily, has not been driving recently ADLs Comments: 2x/week bathes, otherwise sponge bathes, dresses self. house keeper 1x/month, splits other cleaning with daughter    OT Problem List: Impaired balance (sitting and/or standing);Decreased knowledge of precautions;Decreased safety awareness;Decreased activity tolerance;Decreased knowledge of use of DME or AE   OT Treatment/Interventions: Self-care/ADL training;DME and/or AE instruction;Therapeutic activities;Balance training;Energy conservation;Patient/family education      OT Goals(Current goals can be found in the care plan section)   Acute Rehab OT Goals Patient Stated Goal: to get back home OT Goal Formulation: With patient Time For Goal Achievement: 07/10/23 Potential to Achieve Goals: Fair   OT Frequency:  Min 2X/week    Co-evaluation   Reason for Co-Treatment: For patient/therapist safety;To address functional/ADL transfers PT goals addressed during session: Mobility/safety with  mobility OT goals addressed during session: ADL's and self-care      AM-PAC OT 6 Clicks Daily Activity     Outcome Measure Help from another person eating meals?: A Little Help from another person taking care of personal grooming?: A Little Help from another person toileting, which includes using toliet, bedpan, or urinal?: A Lot Help from another person bathing (including washing, rinsing, drying)?: A Lot Help from another person to put on and taking off regular upper body clothing?: A Little Help from another person to put on and taking off regular lower body clothing?: A Lot 6 Click Score: 15   End of Session Equipment Utilized During Treatment: Rolling walker (2 wheels) Nurse Communication: Mobility status;Other (comment) (vitals during session)  Activity Tolerance: Patient limited by fatigue Patient left: in chair;with call bell/phone within reach;with chair alarm set  OT Visit Diagnosis: Unsteadiness on feet (R26.81);Other abnormalities of gait and mobility (R26.89);Muscle weakness (generalized) (M62.81)                Time:  -    Charges:  OT General Charges $OT Visit: 1 Visit OT Evaluation $OT Eval Moderate Complexity: 1 Mod  Kember Boch OTR/L, MS Acute Rehabilitation Department Office# [phone]   Jame Maze 06/26/2023, 1:04 PM

## 2023-06-26 NOTE — Evaluation (Signed)
 Physical Therapy Evaluation Patient Details Name: [name] [name] MRN: [id number] DOB: 06-Apr-1945 Today's Date: 06/26/2023  History of Present Illness  [name] [name] is a 78 y.o. male presented 06/24/23 with progressive SOB requiring NRB mask. Found to have multifocal pneumonia, COPD exacerbation, hyponatremia. WUJ:WJXBJY COPD, chronic hypoxic respiratory failure on 4 L of oxygen  at baseline, rheumatoid arthritis, heart failure with preserved ejection fraction, CAD, hypertension, hyperlipidemia, mood disorder  Clinical Impression  Pt admitted with above diagnosis.   Pt currently with functional limitations due to the deficits listed below (see PT Problem List). Pt will benefit from acute skilled PT to increase their independence and safety with mobility to allow discharge.   The patient presents with significant SOB with mobility on 5 LPM, dropped down to 83% with transfer to recliner. Recovered  to  90% with rest and PLB.  Patient's reports his daughter has been staying with him, but she works. Patient is  basically independent, limited  Ambulation most recently.  Patient will benefit from continued inpatient follow up therapy, <3 hours/day unless patient improves to be able to be home alone during the day.       If plan is discharge home, recommend the following: A little help with walking and/or transfers;Assistance with cooking/housework;A little help with bathing/dressing/bathroom;Help with stairs or ramp for entrance;Assist for transportation   Can travel by private vehicle   Yes    Equipment Recommendations None recommended by PT  Recommendations for Other Services       Functional Status Assessment Patient has had a recent decline in their functional status and demonstrates the ability to make significant improvements in function in a reasonable and predictable amount of time.     Precautions / Restrictions Precautions Precautions: Fall Precaution/Restrictions  Comments: monitor sats, very SOB Restrictions Weight Bearing Restrictions Per Provider Order: No      Mobility  Bed Mobility   Bed Mobility: Supine to Sit     Supine to sit: Contact guard     General bed mobility comments: increased time cues to not hold breath    Transfers Overall transfer level: Needs assistance Equipment used: Rolling walker (2 wheels) Transfers: Sit to/from Stand, Bed to chair/wheelchair/BSC Sit to Stand: Contact guard assist   Step pivot transfers: Supervision       General transfer comment: cues for PLB and limit talking. SPo2 on5 L dropped to 83%. Gradual return to 90%    Ambulation/Gait                  Stairs            Wheelchair Mobility     Tilt Bed    Modified Rankin (Stroke Patients Only)       Balance Overall balance assessment: Needs assistance Sitting-balance support: No upper extremity supported, Feet supported Sitting balance-Leahy Scale: Good     Standing balance support: During functional activity, Bilateral upper extremity supported, Reliant on assistive device for balance Standing balance-Leahy Scale: Fair                               Pertinent Vitals/Pain Pain Assessment Faces Pain Scale: Hurts a little bit Pain Location: abdomen Pain Descriptors / Indicators: Grimacing, Discomfort Pain Intervention(s): Monitored during session    Home Living Family/patient expects to be discharged to:: Private residence Living Arrangements: Children Available Help at Discharge: Family;Available PRN/intermittently Type of Home: Apartment Home Access: Level entry  Home Layout: One level Home Equipment: Grab bars - toilet;Grab bars - tub/shower;Cane - single Librarian, academic (2 wheels);Rollator (4 wheels);Shower seat;Hand held shower head;Adaptive equipment;BSC/3in1 Additional Comments: on 3L home O2; daughter has been staying with family but she works, children are local and can help at  times    Prior Function Prior Level of Function : Independent/Modified Independent;Driving             Mobility Comments: Uses SPC primarily, has not been driving recently ADLs Comments: 2x/week bathes, otherwise sponge bathes, dresses self. Programmer, applications 1x/month, splits other cleaning with daughter     Extremity/Trunk Assessment   Upper Extremity Assessment Upper Extremity Assessment: Overall WFL for tasks assessed    Lower Extremity Assessment Lower Extremity Assessment: Generalized weakness    Cervical / Trunk Assessment Cervical / Trunk Assessment: Kyphotic  Communication   Communication Communication: No apparent difficulties    Cognition Arousal: Alert Behavior During Therapy: WFL for tasks assessed/performed                             Following commands: Intact       Cueing       General Comments      Exercises     Assessment/Plan    PT Assessment Patient needs continued PT services  PT Problem List Decreased strength;Decreased activity tolerance;Decreased mobility;Decreased knowledge of precautions;Decreased safety awareness;Decreased knowledge of use of DME       PT Treatment Interventions DME instruction;Therapeutic exercise;Gait training;Functional mobility training;Therapeutic activities;Patient/family education    PT Goals (Current goals can be found in the Care Plan section)  Acute Rehab PT Goals Patient Stated Goal: to walk , go home PT Goal Formulation: With patient Time For Goal Achievement: 07/10/23 Potential to Achieve Goals: Good    Frequency Min 3X/week     Co-evaluation PT/OT/SLP Co-Evaluation/Treatment: Yes Reason for Co-Treatment: For patient/therapist safety;To address functional/ADL transfers PT goals addressed during session: Mobility/safety with mobility OT goals addressed during session: ADL's and self-care       AM-PAC PT 6 Clicks Mobility  Outcome Measure Help needed turning from your back to  your side while in a flat bed without using bedrails?: None Help needed moving from lying on your back to sitting on the side of a flat bed without using bedrails?: None Help needed moving to and from a bed to a chair (including a wheelchair)?: None Help needed standing up from a chair using your arms (e.g., wheelchair or bedside chair)?: A Little     6 Click Score: 15    End of Session Equipment Utilized During Treatment: Oxygen  Activity Tolerance: Treatment limited secondary to medical complications (Comment) (drop in SATS) Patient left: in chair;with call bell/phone within reach;with chair alarm set Nurse Communication: Mobility status PT Visit Diagnosis: Unsteadiness on feet (R26.81);Muscle weakness (generalized) (M62.81)    Time: 4098-1191 PT Time Calculation (min) (ACUTE ONLY): 17 min   Charges:   PT Evaluation $PT Eval Low Complexity: 1 Low   PT General Charges $$ ACUTE PT VISIT: 1 Visit         Abelina Hoes PT Acute Rehabilitation Services Office [phone]   Dareen Ebbing 06/26/2023, 1:09 PM

## 2023-06-26 NOTE — Progress Notes (Addendum)
 PROGRESS NOTE    [name] [name]  XBJ:[id number] DOB: [birth year] DOA: 06/24/2023 PCP: Lanae Pinal, MD    Brief Narrative:  [name] [name] is a 78 y.o. male with past medical history of severe COPD, chronic hypoxic respiratory failure on 4 L of oxygen  at baseline, rheumatoid arthritis, heart failure with preserved ejection fraction, CAD, hypertension, hyperlipidemia, mood disorder presented to hospital with progressive shortness of breath and dyspnea on exertion.  He had progressive dyspnea at the point that he was short of breath at rest and was desaturating and needed his oxygen  to be bumped up to 5 L.  EMS was called and patient was brought into the hospital on nonrebreather mask.  Has been having cough minimally productive for a week.  Patient was then admitted to the hospital for further evaluation and treatment     Assessment and Plan: Principal Problem:   Multifocal pneumonia Active Problems:   Acute on chronic hypoxic respiratory failure (HCC)   COPD with acute exacerbation (HCC)   Hyponatremia   Acute on chronic hypoxic respiratory failure, requiring BiPAP  Multifocal pneumonia COPD exacerbation, severe  SIRS secondary to above; low concern for sepsis  Hx progressive SOB x 1 month, new cough x 1 week, and acute decompensation just PTA. Hypoxic on EMS arrival to 80% placed on NRB and then transitioned to midflow 12L but due to respiratory distress and work of breathing was placed on BiPAP subsequently.  Initially tachycardic.  Lactate 1.2.  Flu COVID RSV is negative.  Recent CT on 01/14/23 showed negative for PE.  Chest x-ray in the ED showed bilateral patchy airspace opacities in the left mid and right lower lung  zones.  Continue BiPAP.  Continue Rocephin  and Zithromax .  Continue Solu-Medrol  every hourly.  Continue home Breztri , Spiriva, DuoNebs every 6 hours scheduled, albuterol  every 4 hours as needed, incentive spirometer, flutter valve, encourage out of bed to chair.   Blood cultures negative in 1 day..  Will need home oxygen  evaluation prior to discharge.  Consider pulmonary rehab referral on discharge.   Atypical chest pain  Hx intermittent chest pain over past month. EKG without acute ischemic changes. HS trop negative x2. Recent CTA negative for PE outpatient.  Atypical musculoskeletal related.  Continue symptomatic care.   Hyponatremia, mild asymptomatic Will continue to monitor closely.  Latest sodium of 133.   Hypokalemia  improved.  Latest potassium of 4.0  Hypomagnesemia.  Latest magnesium  2.1 after replacement.  RA: On methotrexate  injection weekly as outpatient. Continue folate   HFpEF, without acute exacerbation: Euvolemic by exam, BNP 35.  Continue Lasix  daily.    CAD by imaging/ Hyperlipidemia continue home statin   HTN: Amlodipine  on hold.  Will resume  Mood d/o: Continue Xanax  as needed sertraline .  Constipation and urinary retention.  Required In-N-Out cath.  Will get magnesium  citrate today.  Will ensure adequate bowel movements.  Debility, deconditioning.  PT OT has recommended skilled nursing facility placement.  Will consult TOC.     DVT prophylaxis: enoxaparin  (LOVENOX ) injection 40 mg Start: 06/25/23 1000   Code Status:     Code Status: Full Code  Disposition: Skilled nursing facility as per PT OT recommendation.   Status is: Inpatient Remains inpatient appropriate because: Respiratory distress , pending clinical improvement, IV antibiotics, need for rehabilitation.   Family Communication: .  Spoke with the patient's daughter on the phone on 06/25/2023  Consultants:  None.  Procedures:  BiPAP  Antimicrobials:  Rocephin  and Zithromax   Anti-infectives (From admission,  onward)    Start     Dose/Rate Route Frequency Ordered Stop   06/25/23 2200  azithromycin  (ZITHROMAX ) tablet 500 mg        500 mg Oral Daily at bedtime 06/24/23 2236 06/27/23 2159   06/25/23 2100  cefTRIAXone  (ROCEPHIN ) 1 g in sodium chloride   0.9 % 100 mL IVPB        1 g 200 mL/hr over 30 Minutes Intravenous Every 24 hours 06/24/23 2236 06/29/23 2059   06/24/23 2030  azithromycin  (ZITHROMAX ) 500 mg in sodium chloride  0.9 % 250 mL IVPB        500 mg 250 mL/hr over 60 Minutes Intravenous  Once 06/24/23 2025 06/24/23 2235   06/24/23 2030  cefTRIAXone  (ROCEPHIN ) 1 g in sodium chloride  0.9 % 100 mL IVPB        1 g 200 mL/hr over 30 Minutes Intravenous  Once 06/24/23 2025 06/24/23 2103        Subjective: Today, patient was seen and examined at bedside.  Complains of feeling little better today with breathing.  Had BiPAP in the nighttime.  Currently on 4 L of oxygen .  Has some cough.  Patient complains of constipation.  Had urinary retention and needed In-N-Out cath.  Objective: Vitals:   06/26/23 1300 06/26/23 1400 06/26/23 1449 06/26/23 1521  BP: (!) 142/58 (!) 158/56    Pulse: 97 88  98  Resp: (!) 25 (!) 26  (!) 34  Temp:      TempSrc:      SpO2: 94% 95% 98% 91%  Weight:      Height:        Intake/Output Summary (Last 24 hours) at 06/26/2023 1615 Last data filed at 06/26/2023 1340 Gross per 24 hour  Intake 253 ml  Output 1200 ml  Net -947 ml   Filed Weights   06/24/23 2042 06/25/23 0134 06/26/23 0822  Weight: 91.6 kg 95.6 kg 93.4 kg    Physical Examination: Body mass index is 28.72 kg/m.   General:  Average built, elderly male, on nasal cannula oxygen , not in obvious distress today HENT:   No scleral pallor or icterus noted. Oral mucosa is moist.  Chest: Diminished breath sounds bilaterally.  Coarse breath sounds  CVS: S1 &S2 heard. No murmur.  Regular rate and rhythm. Abdomen: Soft, nontender, nondistended.  Bowel sounds are heard.   Extremities: No cyanosis, clubbing but with venous stasis changes over the lower extremities, peripheral pulses are palpable. Psych: Alert, awake and oriented, slightly anxious CNS:  No cranial nerve deficits.  Power equal in all extremities.   Skin: Warm and dry.  No rashes  noted.  Data Reviewed:   CBC: Recent Labs  Lab 06/24/23 1930 06/24/23 1943 06/25/23 0312 06/26/23 0307  WBC 8.2  --  5.7 5.6  NEUTROABS 6.4  --   --   --   HGB 13.1 14.3 12.6* 12.5*  HCT 39.8 42.0 38.8* 38.4*  MCV 106.4*  --  109.3* 109.1*  PLT 141*  --  119* 132*    Basic Metabolic Panel: Recent Labs  Lab 06/24/23 1930 06/24/23 1943 06/25/23 0312 06/26/23 0307  NA 131* 133* 134* 133*  K 3.2* 3.4* 4.3 4.0  CL 88* 86* 96* 93*  CO2 31  --  29 31  GLUCOSE 124* 123* 168* 188*  BUN 13 12 12 15   CREATININE 0.77 0.80 0.58* 0.63  CALCIUM  8.3*  --  8.3* 8.7*  MG  --   --  1.5* 2.1  PHOS  --   --  3.0  --     Liver Function Tests: Recent Labs  Lab 06/24/23 1930  AST 22  ALT 16  ALKPHOS 107  BILITOT 0.7  PROT 6.7  ALBUMIN 3.1*     Radiology Studies: DG Chest Port 1 View Result Date: 06/24/2023 EXAM: 1 VIEW XRAY OF THE CHEST 06/24/2023 07:45:00 PM COMPARISON: CT chest dated 06/18/2023. CLINICAL HISTORY: SOB hypoxia. SOB/hypoxia - Pt presents to ED from home via EMS for SOB for over a month. Hx of COPD pt states is worsening. Pt has increased baseline oxygen  from 3L to 5L. Pt was 80% on 5L when EMS arrived- placed on 10L NRB. FINDINGS: LUNGS AND PLEURA: Coarse interstitial markings and emphysematous changes. Superimposed patchy opacities in the left mid lung and right lower lung, suggesting multifocal pneumonia. No pleural effusion. No pneumothorax. HEART AND MEDIASTINUM: No acute abnormality of the cardiac and mediastinal silhouettes. Thoracic aortic atherosclerosis. BONES AND SOFT TISSUES: No acute osseous abnormality. IMPRESSION: 1. Scattered patchy opacities, suspicious for multifocal pneumonia. Electronically signed by: Zadie Herter MD 06/24/2023 07:50 PM EDT RP Workstation: [id number]      LOS: 2 days     Rosena Conradi, MD [facility] Available via Epic secure chat 7am-7pm After these hours, please refer to coverage provider listed on  [email or web address] 06/26/2023, 4:15 PM

## 2023-06-27 DIAGNOSIS — J189 Pneumonia, unspecified organism: Secondary | ICD-10-CM | POA: Diagnosis not present

## 2023-06-27 LAB — CBC
HCT: 37.9 % — ABNORMAL LOW (ref 39.0–52.0)
Hemoglobin: 12.2 g/dL — ABNORMAL LOW (ref 13.0–17.0)
MCH: 35.1 pg — ABNORMAL HIGH (ref 26.0–34.0)
MCHC: 32.2 g/dL (ref 30.0–36.0)
MCV: 108.9 fL — ABNORMAL HIGH (ref 80.0–100.0)
Platelets: 150 10*3/uL (ref 150–400)
RBC: 3.48 MIL/uL — ABNORMAL LOW (ref 4.22–5.81)
RDW: 14.9 % (ref 11.5–15.5)
WBC: 7.8 10*3/uL (ref 4.0–10.5)
nRBC: 0 % (ref 0.0–0.2)

## 2023-06-27 LAB — BASIC METABOLIC PANEL WITH GFR
Anion gap: 10 (ref 5–15)
BUN: 15 mg/dL (ref 8–23)
CO2: 31 mmol/L (ref 22–32)
Calcium: 8.9 mg/dL (ref 8.9–10.3)
Chloride: 94 mmol/L — ABNORMAL LOW (ref 98–111)
Creatinine, Ser: 0.74 mg/dL (ref 0.61–1.24)
GFR, Estimated: >60 mL/min (ref >60)
Glucose, Bld: 179 mg/dL — ABNORMAL HIGH (ref 70–99)
Potassium: 4.3 mmol/L (ref 3.5–5.1)
Sodium: 135 mmol/L (ref 135–145)

## 2023-06-27 LAB — MAGNESIUM: Magnesium: 2.2 mg/dL (ref 1.7–2.4)

## 2023-06-27 MED ORDER — ALPRAZOLAM 0.25 MG PO TABS
0.2500 mg | ORAL_TABLET | Freq: Three times a day (TID) | ORAL | Status: DC | PRN
  Administered 2023-06-27 – 2023-07-01 (×8): 0.25 mg via ORAL
  Filled 2023-06-27 (×8): qty 1

## 2023-06-27 MED ORDER — AMLODIPINE BESYLATE 5 MG PO TABS
5.0000 mg | ORAL_TABLET | Freq: Every day | ORAL | Status: DC
  Administered 2023-06-27 – 2023-07-01 (×5): 5 mg via ORAL
  Filled 2023-06-27 (×5): qty 1

## 2023-06-27 MED ORDER — LORAZEPAM 2 MG/ML IJ SOLN: 0.5000 mg | Freq: Once | INTRAMUSCULAR | Status: DC

## 2023-06-27 NOTE — Plan of Care (Signed)

## 2023-06-27 NOTE — Progress Notes (Signed)
   06/27/23 1357  BiPAP/CPAP/SIPAP  BiPAP/CPAP/SIPAP Pt Type Adult  BiPAP/CPAP/SIPAP V60  Mask Type Full face mask  Mask Size Large  Set Rate 16 breaths/min  Respiratory Rate 39 breaths/min  IPAP 12 cmH20  EPAP 6 cmH2O  FiO2 (%) 45 %  Minute Ventilation 29.2  Leak 4  Peak Inspiratory Pressure (PIP) 15  Tidal Volume (Vt) 702  Patient Home Machine No  Patient Home Mask No  Patient Home Tubing No  Auto Titrate No  Press High Alarm 30 cmH2O  Press Low Alarm 5 cmH2O  Device Plugged into RED Power Outlet Yes  BiPAP/CPAP /SiPAP Vitals  Pulse Rate (!) 101  Resp (!) 39  SpO2 98 %  Bilateral Breath Sounds Diminished  MEWS Score/Color  MEWS Score 4  MEWS Score Color Red   Patient placed on BiPAP at this time due to having cyanotic lips per RN.

## 2023-06-27 NOTE — Plan of Care (Signed)
  Problem: Education: Goal: Knowledge of General Education information will improve Description: Including pain rating scale, medication(s)/side effects and non-pharmacologic comfort measures 06/27/2023 1930 by Tanja Fang, RN Outcome: Progressing 06/27/2023 1928 by Tanja Fang, RN Outcome: Progressing

## 2023-06-27 NOTE — Progress Notes (Signed)
   06/27/23 1705  BiPAP/CPAP/SIPAP  BiPAP/CPAP/SIPAP Pt Type Adult  BiPAP/CPAP/SIPAP V60  Mask Type Full face mask  Mask Size Large  Set Rate 16 breaths/min  Respiratory Rate 41 breaths/min  IPAP 12 cmH20  EPAP 6 cmH2O  FiO2 (%) 40 %  Minute Ventilation 27  Leak 10  Peak Inspiratory Pressure (PIP) 14  Tidal Volume (Vt) 659  Patient Home Machine No  Patient Home Mask No  Patient Home Tubing No  Auto Titrate No  Press High Alarm 30 cmH2O  Press Low Alarm 5 cmH2O  Device Plugged into RED Power Outlet Yes  BiPAP/CPAP /SiPAP Vitals  Pulse Rate (!) 113  Resp (!) 41  SpO2 98 %  Bilateral Breath Sounds Diminished  MEWS Score/Color  MEWS Score 5  MEWS Score Color Red   Placed on patient at this time for tachypnea and increase WOB. RN aware.

## 2023-06-27 NOTE — Progress Notes (Signed)
 PROGRESS NOTE    [name] [name]  ZOX:[id number] DOB: 22-May-1945 DOA: 06/24/2023 PCP: Lanae Pinal, MD    Brief Narrative:   [name] [name] is a 78 y.o. male with past medical history of severe COPD, chronic hypoxic respiratory failure on 4 L of oxygen  at baseline, rheumatoid arthritis, heart failure with preserved ejection fraction, CAD, hypertension, hyperlipidemia, mood disorder presented to hospital with progressive shortness of breath and dyspnea on exertion.  He had progressive dyspnea at the point that he was short of breath at rest and was desaturating and needed his oxygen  to be bumped up to 5 L.  EMS was called and patient was brought into the hospital on nonrebreather mask.  Has been having cough minimally productive for a week.  Patient was then admitted to the hospital for further evaluation and treatment     Assessment and Plan: Principal Problem:   Multifocal pneumonia Active Problems:   Acute on chronic hypoxic respiratory failure (HCC)   COPD with acute exacerbation (HCC)   Hyponatremia   Acute on chronic hypoxic respiratory failure, requiring BiPAP  Multifocal pneumonia COPD exacerbation, severe  SIRS secondary to above; low concern for sepsis  Hx progressive SOB x 1 month, new cough x 1 week, and acute decompensation just PTA. Hypoxic on EMS arrival to 80% placed on NRB and then transitioned to midflow 12L but due to respiratory distress and work of breathing was placed on BiPAP subsequently.  Initially tachycardic.  Lactate 1.2.  Flu COVID RSV is negative.  Recent CT on 01/14/23 showed negative for PE.  Chest x-ray in the ED showed bilateral patchy airspace opacities in the left mid and right lower lung  zones.  Continue BiPAP.  Continue Rocephin  and Zithromax .  Continue Solu-Medrol , home Breztri , Spiriva, DuoNebs every 6 hours scheduled, albuterol  every 4 hours as needed, incentive spirometer, flutter valve, encourage out of bed to chair.  Blood cultures  negative in 2 day.  Will need home oxygen  evaluation prior to discharge.  Consider pulmonary rehab referral on discharge.   Atypical chest pain  Hx intermittent chest pain over past month. EKG without acute ischemic changes. HS trop negative x2. Recent CTA negative for PE outpatient.  Atypical musculoskeletal related.  Continue symptomatic care.   Hyponatremia, mild asymptomatic Will continue to monitor closely.  Latest sodium of 133.   Hypokalemia  improved.  Latest potassium of 4.0  Hypomagnesemia.  Latest magnesium  2.1 after replacement.  RA: On methotrexate  injection weekly as outpatient. Continue folate   HFpEF, without acute exacerbation: Euvolemic by exam, BNP 35.  Continue Lasix  daily.  Has been having good diuresis.  Negative balance for 4476 mL so far.  Patient stated that he did gain some weight at home though.  Was seen by Dr. Dean Every in the past.  At this time we will continue with diuresis strict intake and output charting Daily weights.  CAD by imaging/ Hyperlipidemia continue home statin   HTN: Amlodipine  on hold.  Will resume  Mood disorder.  Continue Xanax  as needed sertraline .  Constipation and urinary retention.  Required In-N-Out cath.  Received magnesium  citrate with bowel movements.  Has been able to urinate on his own since In-N-Out cath.  Debility, deconditioning.  PT OT has recommended skilled nursing facility placement.  TOC has been consulted.     DVT prophylaxis: enoxaparin  (LOVENOX ) injection 40 mg Start: 06/25/23 1000   Code Status:     Code Status: Full Code  Disposition: Skilled nursing facility as per PT OT  recommendation likely in 1 to 2 days.  Status is: Inpatient  Remains inpatient appropriate because: , pending clinical improvement, IV antibiotics, need for rehabilitation.   Family Communication: .  Spoke with the patient's daughter on the phone on 06/25/2023  Consultants:  None.  Procedures:  BiPAP  Antimicrobials:  Rocephin  and  Zithromax   Anti-infectives (From admission, onward)    Start     Dose/Rate Route Frequency Ordered Stop   06/25/23 2200  azithromycin  (ZITHROMAX ) tablet 500 mg        500 mg Oral Daily at bedtime 06/24/23 2236 06/26/23 2202   06/25/23 2100  cefTRIAXone  (ROCEPHIN ) 1 g in sodium chloride  0.9 % 100 mL IVPB        1 g 200 mL/hr over 30 Minutes Intravenous Every 24 hours 06/24/23 2236 06/29/23 2059   06/24/23 2030  azithromycin  (ZITHROMAX ) 500 mg in sodium chloride  0.9 % 250 mL IVPB        500 mg 250 mL/hr over 60 Minutes Intravenous  Once 06/24/23 2025 06/24/23 2235   06/24/23 2030  cefTRIAXone  (ROCEPHIN ) 1 g in sodium chloride  0.9 % 100 mL IVPB        1 g 200 mL/hr over 30 Minutes Intravenous  Once 06/24/23 2025 06/24/23 2103      Subjective: Today, patient was seen and examined at bedside.  Has been been having some diuresis still feels short of breath and dyspneic but had used around 3 hours of BiPAP yesterday evening and required 1 hour of BiPAP this morning, currently on 4 L of oxygen .  Required In-N-Out cath yesterday but able to urinate now.  Objective: Vitals:   06/27/23 0817 06/27/23 0905 06/27/23 0908 06/27/23 0909  BP:    (!) 145/66  Pulse: 91 89 92 94  Resp: (!) 21 (!) 32 16 17  Temp:      TempSrc:      SpO2: 99% 99% 94% 94%  Weight:      Height:        Intake/Output Summary (Last 24 hours) at 06/27/2023 1144 Last data filed at 06/27/2023 1124 Gross per 24 hour  Intake 3 ml  Output 2675 ml  Net -2672 ml   Filed Weights   06/24/23 2042 06/25/23 0134 06/26/23 0822  Weight: 91.6 kg 95.6 kg 93.4 kg    Physical Examination: Body mass index is 28.72 kg/m.   General:  Average built, elderly male, on nasal cannula oxygen , not in obvious distress today HENT:   No scleral pallor or icterus noted. Oral mucosa is moist.  Chest: Diminished breath sounds bilaterally.,  Coarse breath sounds noted CVS: S1 &S2 heard. No murmur.  Regular rate and rhythm. Abdomen: Soft,  nontender, nondistended.  Bowel sounds are heard.   Extremities: No cyanosis, clubbing but with venous stasis changes over the lower extremities, peripheral pulses are palpable. Psych: Alert, awake and oriented, slightly anxious CNS:  No cranial nerve deficits.  Power equal in all extremities.   Skin: Warm and dry.  No rashes noted.  Data Reviewed:   CBC: Recent Labs  Lab 06/24/23 1930 06/24/23 1943 06/25/23 0312 06/26/23 0307 06/27/23 0312  WBC 8.2  --  5.7 5.6 7.8  NEUTROABS 6.4  --   --   --   --   HGB 13.1 14.3 12.6* 12.5* 12.2*  HCT 39.8 42.0 38.8* 38.4* 37.9*  MCV 106.4*  --  109.3* 109.1* 108.9*  PLT 141*  --  119* 132* 150    Basic Metabolic Panel: Recent Labs  Lab 06/24/23 1930 06/24/23 1943 06/25/23 0312 06/26/23 0307 06/27/23 0312  NA 131* 133* 134* 133* 135  K 3.2* 3.4* 4.3 4.0 4.3  CL 88* 86* 96* 93* 94*  CO2 31  --  29 31 31   GLUCOSE 124* 123* 168* 188* 179*  BUN 13 12 12 15 15   CREATININE 0.77 0.80 0.58* 0.63 0.74  CALCIUM  8.3*  --  8.3* 8.7* 8.9  MG  --   --  1.5* 2.1 2.2  PHOS  --   --  3.0  --   --     Liver Function Tests: Recent Labs  Lab 06/24/23 1930  AST 22  ALT 16  ALKPHOS 107  BILITOT 0.7  PROT 6.7  ALBUMIN 3.1*     Radiology Studies: No results found.     LOS: 3 days     Rosena Conradi, MD [facility] Available via Epic secure chat 7am-7pm After these hours, please refer to coverage provider listed on [email or web address] 06/27/2023, 11:44 AM

## 2023-06-27 NOTE — NC FL2 (Signed)
 [place]  MEDICAID FL2 LEVEL OF CARE FORM     IDENTIFICATION  Patient Name: [name] [name] [facility] Birthdate: [birth year] Sex: male Admission Date (Current Location): 06/24/2023  [facility] and [place] Number:  Producer, television/film/video and Address:  [facility],  [address] [place], [place] [address]      Provider Number: [id number]  Attending Physician Name and Address:  Rosena Conradi, MD  Relative Name and Phone Number:  Horice, Carrero (Daughter)  [phone]    Current Level of Care: Hospital Recommended Level of Care: Skilled Nursing Facility Prior Approval Number:    Date Approved/Denied:   PASRR Number: [id number]  Discharge Plan: SNF    Current Diagnoses: Patient Active Problem List   Diagnosis Date Noted   Hyponatremia 06/25/2023   Multifocal pneumonia 06/24/2023   COPD with acute exacerbation (HCC) 03/29/2023   Acute on chronic diastolic CHF (congestive heart failure) (HCC) 03/29/2023   Pleural effusion 03/23/2023   Rib fracture 03/22/2023   Abnormal CT lung screening 11/12/2022   Rheumatoid arthritis (HCC) 11/12/2022   Constipation 07/08/2022   GERD without esophagitis 07/08/2022   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   Shortness of breath 07/06/2022   Acute on chronic hypoxic respiratory failure (HCC) 07/04/2022   Alcohol abuse 07/04/2022   Macrocytosis 07/04/2022   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   Swelling of lower extremity 06/24/2020   Former smoker 09/04/2019   Coronary artery calcification seen on CT scan 08/12/2019   Essential hypertension 08/12/2019   Hyperlipidemia 08/12/2019   Chronic respiratory failure with hypoxia (HCC) 03/30/2019   COPD (chronic obstructive pulmonary disease) (HCC) 03/18/2018    Orientation RESPIRATION BLADDER Height & Weight     Self, Time, Situation, Place  O2 (4L) Continent Weight: 93.4 kg Height:  5' 11 (180.3 cm)  BEHAVIORAL SYMPTOMS/MOOD NEUROLOGICAL BOWEL  NUTRITION STATUS      Incontinent Diet (Regular)  AMBULATORY STATUS COMMUNICATION OF NEEDS Skin   Extensive Assist Verbally Normal                       Personal Care Assistance Level of Assistance  Bathing, Feeding, Dressing Bathing Assistance: Limited assistance Feeding assistance: Independent Dressing Assistance: Limited assistance     Functional Limitations Info  Sight, Hearing, Speech Sight Info: Impaired Hearing Info: Adequate Speech Info: Adequate    SPECIAL CARE FACTORS FREQUENCY  PT (By licensed PT), OT (By licensed OT)     PT Frequency: 5xwk OT Frequency: 5xwk            Contractures Contractures Info: Not present    Additional Factors Info  Code Status, Allergies Code Status Info: FULL Allergies Info: Fluticasone -salmeterol           Current Medications (06/27/2023):  This is the current hospital active medication list Current Facility-Administered Medications  Medication Dose Route Frequency Provider Last Rate Last Admin   acetaminophen  (TYLENOL ) tablet 1,000 mg  1,000 mg Oral Q6H PRN Arnulfo Larch, MD   1,000 mg at 06/26/23 1136   albuterol  (PROVENTIL ) (2.5 MG/3ML) 0.083% nebulizer solution 2.5 mg  2.5 mg Nebulization Q4H PRN Segars, Jonathan, MD   2.5 mg at 06/26/23 1216   ALPRAZolam  (XANAX ) tablet 0.25 mg  0.25 mg Oral Daily PRN Segars, Jonathan, MD   0.25 mg at 06/26/23 1136   amLODipine  (NORVASC ) tablet 5 mg  5 mg Oral Daily Pokhrel, Laxman, MD       atorvastatin  (LIPITOR) tablet 20 mg  20 mg Oral Daily Segars,  Arlyce Lambert, MD   20 mg at 06/27/23 1610   budesonide -glycopyrrolate-formoterol  (BREZTRI ) 160-9-4.8 MCG/ACT inhaler 2 puff  2 puff Inhalation BID Segars, Jonathan, MD   2 puff at 06/27/23 0907   cefTRIAXone  (ROCEPHIN ) 1 g in sodium chloride  0.9 % 100 mL IVPB  1 g Intravenous Q24H Segars, Jonathan, MD   Stopped at 06/26/23 2243   Chlorhexidine  Gluconate Cloth 2 % PADS 6 each  6 each Topical Daily Segars, Jonathan, MD   6 each at 06/27/23  0907   cyanocobalamin  (VITAMIN B12) tablet 1,000 mcg  1,000 mcg Oral Daily Segars, Jonathan, MD   1,000 mcg at 06/27/23 9604   enoxaparin  (LOVENOX ) injection 40 mg  40 mg Subcutaneous Q24H Segars, Jonathan, MD   40 mg at 06/27/23 0908   folic acid  (FOLVITE ) tablet 1 mg  1 mg Oral Daily Segars, Jonathan, MD   1 mg at 06/27/23 5409   furosemide  (LASIX ) tablet 40 mg  40 mg Oral q AM Segars, Jonathan, MD   40 mg at 06/27/23 0905   ipratropium-albuterol  (DUONEB) 0.5-2.5 (3) MG/3ML nebulizer solution 3 mL  3 mL Nebulization Q6H Segars, Arlyce Lambert, MD   3 mL at 06/27/23 0817   lidocaine  (LMX) 4 % cream   Topical Daily PRN Pokhrel, Laxman, MD   1 Application at 06/26/23 1824   melatonin tablet 6 mg  6 mg Oral QHS PRN Segars, Jonathan, MD   6 mg at 06/25/23 2033   methocarbamol  (ROBAXIN ) tablet 500 mg  500 mg Oral Q8H PRN Segars, Jonathan, MD       methylPREDNISolone  sodium succinate (SOLU-MEDROL ) 125 mg/2 mL injection 60 mg  60 mg Intravenous Q12H Segars, Jonathan, MD   60 mg at 06/27/23 0857   ondansetron  (ZOFRAN ) injection 4 mg  4 mg Intravenous Q6H PRN Segars, Arlyce Lambert, MD       Oral care mouth rinse  15 mL Mouth Rinse PRN Pokhrel, Laxman, MD       pantoprazole  (PROTONIX ) EC tablet 40 mg  40 mg Oral QAC breakfast Segars, Jonathan, MD   40 mg at 06/27/23 0905   polyethylene glycol (MIRALAX  / GLYCOLAX ) packet 17 g  17 g Oral Daily PRN Segars, Jonathan, MD   17 g at 06/25/23 2033   prochlorperazine (COMPAZINE) injection 10 mg  10 mg Intravenous Q6H PRN Segars, Arlyce Lambert, MD       sertraline  (ZOLOFT ) tablet 100 mg  100 mg Oral Daily Segars, Jonathan, MD   100 mg at 06/27/23 0905   sodium chloride  flush (NS) 0.9 % injection 3 mL  3 mL Intravenous Q12H Segars, Jonathan, MD   3 mL at 06/27/23 0908     Discharge Medications: Please see discharge summary for a list of discharge medications.  Relevant Imaging Results:  Relevant Lab Results:   Additional Information [id number]  Tessie Fila,  RN

## 2023-06-27 NOTE — Progress Notes (Signed)
   06/27/23 0817  BiPAP/CPAP/SIPAP  BiPAP/CPAP/SIPAP Pt Type Adult  BiPAP/CPAP/SIPAP V60  Mask Type Full face mask  Mask Size Large  Set Rate 16 breaths/min  Respiratory Rate 32 breaths/min  IPAP 12 cmH20  EPAP 6 cmH2O  FiO2 (%) 45 %  Minute Ventilation 18.2  Leak 11  Peak Inspiratory Pressure (PIP) 13  Tidal Volume (Vt) 493  Patient Home Machine No  Patient Home Mask No  Patient Home Tubing No  Auto Titrate No  Press High Alarm 30 cmH2O  Press Low Alarm 5 cmH2O  Device Plugged into RED Power Outlet Yes  BiPAP/CPAP /SiPAP Vitals  Pulse Rate 91  Resp (!) 21  SpO2 99 %  Bilateral Breath Sounds Diminished  MEWS Score/Color  MEWS Score 1  MEWS Score Color Green   Patient placed on BiPAP by RN at shift change due to patient calling out for help with increased WOB per RN. Patient is resting comfortably on the BiPAP at this time.

## 2023-06-28 DIAGNOSIS — J441 Chronic obstructive pulmonary disease with (acute) exacerbation: Secondary | ICD-10-CM | POA: Diagnosis not present

## 2023-06-28 DIAGNOSIS — J9621 Acute and chronic respiratory failure with hypoxia: Secondary | ICD-10-CM | POA: Diagnosis not present

## 2023-06-28 DIAGNOSIS — J189 Pneumonia, unspecified organism: Secondary | ICD-10-CM | POA: Diagnosis not present

## 2023-06-28 DIAGNOSIS — E871 Hypo-osmolality and hyponatremia: Secondary | ICD-10-CM | POA: Diagnosis not present

## 2023-06-28 LAB — CBC
HCT: 41.3 % (ref 39.0–52.0)
Hemoglobin: 13.2 g/dL (ref 13.0–17.0)
MCH: 35.2 pg — ABNORMAL HIGH (ref 26.0–34.0)
MCHC: 32 g/dL (ref 30.0–36.0)
MCV: 110.1 fL — ABNORMAL HIGH (ref 80.0–100.0)
Platelets: 148 10*3/uL — ABNORMAL LOW (ref 150–400)
RBC: 3.75 MIL/uL — ABNORMAL LOW (ref 4.22–5.81)
RDW: 15.3 % (ref 11.5–15.5)
WBC: 10.4 10*3/uL (ref 4.0–10.5)
nRBC: 0 % (ref 0.0–0.2)

## 2023-06-28 LAB — CULTURE, RESPIRATORY W GRAM STAIN: Gram Stain: NONE SEEN

## 2023-06-28 LAB — BASIC METABOLIC PANEL WITH GFR
Anion gap: 11 (ref 5–15)
BUN: 15 mg/dL (ref 8–23)
CO2: 31 mmol/L (ref 22–32)
Calcium: 9.1 mg/dL (ref 8.9–10.3)
Chloride: 93 mmol/L — ABNORMAL LOW (ref 98–111)
Creatinine, Ser: 0.73 mg/dL (ref 0.61–1.24)
GFR, Estimated: >60 mL/min (ref >60)
Glucose, Bld: 189 mg/dL — ABNORMAL HIGH (ref 70–99)
Potassium: 4.9 mmol/L (ref 3.5–5.1)
Sodium: 135 mmol/L (ref 135–145)

## 2023-06-28 LAB — MAGNESIUM: Magnesium: 2 mg/dL (ref 1.7–2.4)

## 2023-06-28 MED ORDER — SULFAMETHOXAZOLE-TRIMETHOPRIM 800-160 MG PO TABS
1.0000 | ORAL_TABLET | Freq: Two times a day (BID) | ORAL | Status: DC
  Administered 2023-06-28 – 2023-07-01 (×7): 1 via ORAL
  Filled 2023-06-28 (×7): qty 1

## 2023-06-28 NOTE — Progress Notes (Signed)
 Progress Note   Patient: [name] [name] [name]:[id number] DOB: November 24, 1945 DOA: 06/24/2023     4 DOS: the patient was seen and examined on 06/28/2023   Brief hospital course: [name] [name] is a 78 y.o. male with past medical history of severe COPD, chronic hypoxic respiratory failure on 4 L of oxygen  at baseline, rheumatoid arthritis, heart failure with preserved ejection fraction, CAD, hypertension, hyperlipidemia, mood disorder presented to hospital with progressive shortness of breath and dyspnea on exertion.  He had progressive dyspnea at the point that he was short of breath at rest and was desaturating and needed his oxygen  to be bumped up to 5 L.  EMS was called and patient was brought into the hospital on nonrebreather mask.  Has been having cough minimally productive for a week.  Patient was then admitted to the hospital for further evaluation and treatment    Assessment and Plan: Acute on chronic hypoxic respiratory failure, requiring BiPAP  Multifocal pneumonia COPD exacerbation, severe  SIRS secondary to above; low concern for sepsis  Hx progressive SOB x 1 month, new cough x 1 week, and acute decompensation just PTA. Hypoxic on EMS arrival to 80% placed on NRB and then transitioned to midflow 12L but due to respiratory distress and work of breathing was placed on BiPAP subsequently.  Initially tachycardic.  Lactate 1.2.  Flu COVID RSV is negative.  Recent CT on 01/14/23 showed negative for PE.  Chest x-ray in the ED showed bilateral patchy airspace opacities in the left mid and right lower lung  zones.  Continue BiPAP.  Continue Rocephin  and Zithromax .  Continue Solu-Medrol , home Breztri , Spiriva, DuoNebs every 6 hours scheduled, albuterol  every 4 hours as needed, incentive spirometer, flutter valve, encourage out of bed to chair.  Blood cultures negative in 2 day.  Will need home oxygen  evaluation prior to discharge.  Pulmonary rehab referral upon discharge. PT/ OT advised SNF for  STR.   Atypical chest pain  Hx intermittent chest pain over past month. EKG without acute ischemic changes. Troponin remained negative x2. Recent CTA negative for PE outpatient.  Possible musculoskeletal related.  Continue symptomatic care.   Hyponatremia, mild asymptomatic Improved. Continue to monitor. Latest 135.   Hypokalemia  Improved with supplementation.s   Hypomagnesemia.  Latest magnesium  2.0 after replacement.   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 Continue folate    HFpEF, without acute exacerbation: Euvolemic by exam, BNP 35.  Continue Lasix  daily.  Has been having good diuresis.  Negative fluid balance noted.   Was seen by Dr. Dean Every in the past.  At this time we will continue with diuresis strict intake and output charting Daily weights.   CAD by imaging/ Hyperlipidemia  continue home statin    Hypertension: Resumed Amlodipine .   Mood disorder.  Continue Xanax  as needed sertraline .   Constipation and urinary retention.   He had good bowel movement yesterday. Continue constipation regimen.   Debility, deconditioning.  PT/ OT recommended skilled nursing facility for STR placement.  TOC on board.     Out of bed to chair. Incentive spirometry. Nursing supportive care. Fall, aspiration precautions. Diet:  Diet Orders (From admission, onward)     Start     Ordered   06/25/23 1123  Diet regular Room service appropriate? Yes; Fluid consistency: Thin  Diet effective now       Question Answer Comment  Room service appropriate? Yes   Fluid consistency: Thin      06/25/23 1122  DVT prophylaxis: enoxaparin  (LOVENOX ) injection 40 mg Start: 06/25/23 1000  Level of care: Stepdown   Code Status: Full Code  Subjective: Patient is seen and examined today morning. He is on 6-8 L supplemental oxygen . Was on Bipap last night. Feels better. Did not get out of bed.   Physical Exam: Vitals:   06/28/23 1000 06/28/23  1100 06/28/23 1157 06/28/23 1300  BP: 129/60     Pulse: 93 91  (!) 102  Resp: 16 (!) 28  20  Temp:   98.8 F (37.1 C)   TempSrc:   Axillary   SpO2: 94% 95%  92%  Weight:      Height:        General - Elderly Caucasian male, moderate respiratory distress HEENT - PERRLA, EOMI, atraumatic head, non tender sinuses. Lung - decreased, basal rales, rhonchi, diffuse wheezes. Heart - S1, S2 heard, no murmurs, rubs, trace pedal edema. Abdomen - Soft, non tender, bowel sounds good Neuro - Alert, awake and oriented x 3, non focal exam. Skin - Warm and dry.  Data Reviewed:      Latest Ref Rng & Units 06/28/2023    3:13 AM 06/27/2023    3:12 AM 06/26/2023    3:07 AM  CBC  WBC 4.0 - 10.5 K/uL 10.4  7.8  5.6   Hemoglobin 13.0 - 17.0 g/dL 16.1  09.6  04.5   Hematocrit 39.0 - 52.0 % 41.3  37.9  38.4   Platelets 150 - 400 K/uL 148  150  132       Latest Ref Rng & Units 06/28/2023    3:13 AM 06/27/2023    3:12 AM 06/26/2023    3:07 AM  BMP  Glucose 70 - 99 mg/dL 409  811  914   BUN 8 - 23 mg/dL 15  15  15    Creatinine 0.61 - 1.24 mg/dL 7.82  9.56  2.13   Sodium 135 - 145 mmol/L 135  135  133   Potassium 3.5 - 5.1 mmol/L 4.9  4.3  4.0   Chloride 98 - 111 mmol/L 93  94  93   CO2 22 - 32 mmol/L 31  31  31    Calcium  8.9 - 10.3 mg/dL 9.1  8.9  8.7    No results found.  Family Communication: Discussed with patient, he understand and agree. All questions answered.  Disposition: Status is: Inpatient Remains inpatient appropriate because: severity of illness  Planned Discharge Destination: Rehab    MDM level 3 - Patient is requiring high flow [place], Bipap as needed. He remains weak, not getting out of bed, nor eating well. He is at risk for sudden clinical deterioration.  Author: Aisha Hove, MD 06/28/2023 2:30 PM Secure chat 7am to 7pm For on call review [email or web address].

## 2023-06-28 NOTE — TOC Progression Note (Addendum)
 Transition of Care [facility]) - Progression Note    Patient Details  Name: [name] [name] MRN: [id number] Date of Birth: November 28, 1945  Transition of Care [facility]) CM/SW Contact  Tessie Fila, RN Phone Number: 06/28/2023, 12:16 PM  Clinical Narrative:    NCM met with pt at bedside to discuss SNF bed offers. Pt verbalizes that he would like to accept the bed offer at [facility]. NCM spoke with Admissions Coordinator [place] and she states he will have a bed. Will start auth.  Addendum:  0865: Insurance auth started for SNF and PTAR, spoke with Debria Fang with [employer], awaiting approval.  The patient was presented with the following bed offers: [facility]  [address] [place] [place] [address] [phone]  1 star  [facility] [place] [facility]  [address] [place] [address] [phone]  4 stars  [facility]  [address], [place] [address] [phone]  1 star  [facility] Preferred SN[facility]  [address] [place] [address] [phone]  1 star  [facility]erred [facility]  [address] [place] [address] [phone]  3 stars  [facility] Preferred SN[facility]  [address] [place] [address] [phone]  5 stars     Expected Discharge Plan: Skilled Nursing Facility Barriers to Discharge: Continued Medical Work up  Expected Discharge Plan and Services   Discharge Planning Services: CM Consult Post Acute Care Choice: Skilled Nursing Facility Living arrangements for the past 2 months: Apartment                 DME Arranged: N/A DME Agency: Lawson Prey ([facility])       HH Arranged: NA HH Agency: Other - See comment ([facility])         Social Determinants of Health (SDOH) Interventions SDOH Screenings   Food Insecurity: No Food Insecurity (06/25/2023)  Housing: Low  Risk  (06/25/2023)  Transportation Needs: No Transportation Needs (06/25/2023)  Utilities: Not At Risk (06/25/2023)  Depression (PHQ2-9): Low Risk  (03/09/2019)  Social Connections: Socially Isolated (06/25/2023)  Tobacco Use: Medium Risk (06/24/2023)    Readmission Risk Interventions    06/26/2023    2:14 PM 03/31/2023    3:55 PM 07/08/2022   10:55 AM  Readmission Risk Prevention Plan  Post Dischage Appt   Complete  Medication Screening   Complete  Transportation Screening Complete Complete Complete  PCP or Specialist Appt within 3-5 Days  Complete   HRI or Home Care Consult  Complete   Social Work Consult for Recovery Care Planning/Counseling  Complete   Palliative Care Screening  Not Applicable   Medication Review Oceanographer) Complete Complete   PCP or Specialist appointment within 3-5 days of discharge Complete    HRI or Home Care Consult Complete    SW Recovery Care/Counseling Consult Complete    Palliative Care Screening Not Applicable    Skilled Nursing Facility Complete

## 2023-06-28 NOTE — Progress Notes (Signed)
 Physical Therapy Treatment Patient Details Name: [name] [name] MRN: [id number] DOB: 1945-08-16 Today's Date: 06/28/2023   History of Present Illness [name] [name] is a 78 y.o. male presented 06/24/23 with progressive SOB requiring NRB mask. Found to have multifocal pneumonia, COPD exacerbation, hyponatremia. EXB:MWUXLK COPD, chronic hypoxic respiratory failure on 4 L of oxygen  at baseline, rheumatoid arthritis, heart failure with preserved ejection fraction, CAD, hypertension, hyperlipidemia, mood disorder    PT Comments   Pt admitted with above diagnosis.  Pt currently with functional limitations due to the deficits listed below (see PT Problem List). Pt seated in recliner when PT arrived. Pt agreeable to therapy intervention and motivated to ambulate. Pt on 6 L/min throughout intervention and cues provided for pursed lip breathing and slowing respiration rate. Pt required CGA for sit to stand  from recliner, gait tasks with RW, CGA, cues, recliner close with 2 amb bouts 20 feet and then 25 feet with seated therapeutic rest break between, pt O2 saturation decreased to 87% and required 39s to recover to 90% and second amb bout desaturated to 88% and required 53s to recover to 90%. Pt left seated in recliner and all needs in place. Patient will benefit from continued inpatient follow up therapy, <3 hours/day.  Pt will benefit from acute skilled PT to increase their independence and safety with mobility to allow discharge.      If plan is discharge home, recommend the following: A little help with walking and/or transfers;Assistance with cooking/housework;A little help with bathing/dressing/bathroom;Help with stairs or ramp for entrance;Assist for transportation   Can travel by private vehicle     Yes  Equipment Recommendations  None recommended by PT    Recommendations for Other Services       Precautions / Restrictions Precautions Precautions: Fall Precaution/Restrictions  Comments: monitor sats, very SOB Restrictions Weight Bearing Restrictions Per Provider Order: No     Mobility  Bed Mobility               General bed mobility comments: pt seated in recliner when PT arrived and returned to recliner at end of session    Transfers Overall transfer level: Needs assistance Equipment used: Rolling walker (2 wheels) Transfers: Sit to/from Stand Sit to Stand: Contact guard assist           General transfer comment: pt required min cues and able to push to stand from recliner to RW    Ambulation/Gait Ambulation/Gait assistance: Contact guard assist Gait Distance (Feet): 20 Feet Assistive device: Rolling walker (2 wheels) Gait Pattern/deviations: Step-through pattern, Trunk flexed Gait velocity: decreased     General Gait Details: slight trunk flexion, short stride length and minimal foot clearance, min cues for posture and proper distance from Rw. pt on 6 L/min with mobility tasks   Stairs             Wheelchair Mobility     Tilt Bed    Modified Rankin (Stroke Patients Only)       Balance Overall balance assessment: Needs assistance Sitting-balance support: No upper extremity supported, Feet supported Sitting balance-Leahy Scale: Good     Standing balance support: During functional activity, Bilateral upper extremity supported, Reliant on assistive device for balance Standing balance-Leahy Scale: Fair                              Musician Communication: No apparent difficulties  Cognition Arousal: Alert Behavior During Therapy: [facility] for  tasks assessed/performed                             Following commands: Intact      Cueing    Exercises      General Comments        Pertinent Vitals/Pain Pain Assessment Pain Assessment: No/denies pain    Home Living                          Prior Function            PT Goals (current goals can now be found  in the care plan section) Acute Rehab PT Goals Patient Stated Goal: to walk , go home PT Goal Formulation: With patient Time For Goal Achievement: 07/10/23 Potential to Achieve Goals: Good Progress towards PT goals: Progressing toward goals    Frequency    Min 3X/week      PT Plan      Co-evaluation              AM-PAC PT 6 Clicks Mobility   Outcome Measure  Help needed turning from your back to your side while in a flat bed without using bedrails?: None Help needed moving from lying on your back to sitting on the side of a flat bed without using bedrails?: None Help needed moving to and from a bed to a chair (including a wheelchair)?: None Help needed standing up from a chair using your arms (e.g., wheelchair or bedside chair)?: A Little Help needed to walk in hospital room?: A Little Help needed climbing 3-5 steps with a railing? : Total 6 Click Score: 19    End of Session Equipment Utilized During Treatment: Oxygen ;Gait belt Activity Tolerance: Patient limited by fatigue Patient left: in chair;with call bell/phone within reach;with chair alarm set Nurse Communication: Mobility status PT Visit Diagnosis: Unsteadiness on feet (R26.81);Muscle weakness (generalized) (M62.81)     Time: 1610-9604 PT Time Calculation (min) (ACUTE ONLY): 21 min  Charges:    $Gait Training: 8-22 mins PT General Charges $$ ACUTE PT VISIT: 1 Visit                     Cary Clarks, PT Acute Rehab    Annalee Kiang 06/28/2023, 3:02 PM

## 2023-06-29 DIAGNOSIS — R0789 Other chest pain: Secondary | ICD-10-CM

## 2023-06-29 DIAGNOSIS — J9622 Acute and chronic respiratory failure with hypercapnia: Secondary | ICD-10-CM

## 2023-06-29 DIAGNOSIS — J9621 Acute and chronic respiratory failure with hypoxia: Secondary | ICD-10-CM | POA: Diagnosis not present

## 2023-06-29 DIAGNOSIS — E871 Hypo-osmolality and hyponatremia: Secondary | ICD-10-CM | POA: Diagnosis not present

## 2023-06-29 DIAGNOSIS — J441 Chronic obstructive pulmonary disease with (acute) exacerbation: Secondary | ICD-10-CM | POA: Diagnosis not present

## 2023-06-29 DIAGNOSIS — I5032 Chronic diastolic (congestive) heart failure: Secondary | ICD-10-CM

## 2023-06-29 DIAGNOSIS — J189 Pneumonia, unspecified organism: Secondary | ICD-10-CM | POA: Diagnosis not present

## 2023-06-29 DIAGNOSIS — J1569 Pneumonia due to other gram-negative bacteria: Principal | ICD-10-CM

## 2023-06-29 DIAGNOSIS — J449 Chronic obstructive pulmonary disease, unspecified: Secondary | ICD-10-CM

## 2023-06-29 LAB — CULTURE, RESPIRATORY W GRAM STAIN

## 2023-06-29 LAB — CULTURE, BLOOD (ROUTINE X 2)
Culture: NO GROWTH
Special Requests: ADEQUATE

## 2023-06-29 MED ORDER — PREDNISONE 20 MG PO TABS
40.0000 mg | ORAL_TABLET | Freq: Every day | ORAL | Status: DC
  Administered 2023-06-30 – 2023-07-01 (×2): 40 mg via ORAL
  Filled 2023-06-29 (×2): qty 2

## 2023-06-29 NOTE — TOC Progression Note (Signed)
 Transition of Care [facility]) - Progression Note    Patient Details  Name: [name] [name] MRN: [id number] Date of Birth: 1945-11-27  Transition of Care [facility]) CM/SW Contact  Sonda Manuella Quill, RN Phone Number: 06/29/2023, 9:43 AM  Clinical Narrative:    Notified by Jori, HTA pt has been approved for SNF for 7 days and ambulance transport; SNF Auth # [id number]; 5 business days given to complete transfer; ambulance Auth # [id number].   Expected Discharge Plan: Skilled Nursing Facility Barriers to Discharge: Continued Medical Work up  Expected Discharge Plan and Services   Discharge Planning Services: CM Consult Post Acute Care Choice: Skilled Nursing Facility Living arrangements for the past 2 months: Apartment                 DME Arranged: N/A DME Agency: Cam[place] ([facility])       HH Arranged: NA HH Agency: Other - See comment ([facility])         Social Determinants of Health (SDOH) Interventions SDOH Screenings   Food Insecurity: No Food Insecurity (06/25/2023)  Housing: Low Risk  (06/25/2023)  Transportation Needs: No Transportation Needs (06/25/2023)  Utilities: Not At Risk (06/25/2023)  Depression (PHQ2-9): Low Risk  (03/09/2019)  Social Connections: Socially Isolated (06/25/2023)  Tobacco Use: Medium Risk (06/24/2023)    Readmission Risk Interventions    06/26/2023    2:14 PM 03/31/2023    3:55 PM 07/08/2022   10:55 AM  Readmission Risk Prevention Plan  Post Dischage Appt   Complete  Medication Screening   Complete  Transportation Screening Complete Complete Complete  PCP or Specialist Appt within 3-5 Days  Complete   HRI or Home Care Consult  Complete   Social Work Consult for Recovery Care Planning/Counseling  Complete   Palliative Care Screening  Not Applicable   Medication Review Oceanographer) Complete Complete   PCP or Specialist appointment within 3-5 days of discharge Complete    HRI or Home Care Consult Complete    SW Recovery  Care/Counseling Consult Complete    Palliative Care Screening Not Applicable    Skilled Nursing Facility Complete

## 2023-06-29 NOTE — Progress Notes (Signed)
   06/29/23 0240  BiPAP/CPAP/SIPAP  BiPAP/CPAP/SIPAP Pt Type Adult  BiPAP/CPAP/SIPAP V60  Mask Type Full face mask  Dentures removed? Yes - Placed in denture cup  Mask Size Large  Set Rate 16 breaths/min  Respiratory Rate 22 breaths/min  IPAP 12 cmH20  EPAP 6 cmH2O  FiO2 (%) 40 %  Minute Ventilation 21.9  Leak 15  Peak Inspiratory Pressure (PIP) 14  Tidal Volume (Vt) 708  Patient Home Machine No  Patient Home Mask No  Patient Home Tubing No  Auto Titrate No  Press High Alarm 45 cmH2O  Press Low Alarm 5 cmH2O  Device Plugged into RED Power Outlet Yes  BiPAP/CPAP /SiPAP Vitals  Pulse Rate 82  Resp (!) 23  SpO2 99 %  Bilateral Breath Sounds Expiratory wheezes  MEWS Score/Color  MEWS Score 2  MEWS Score Color Yellow

## 2023-06-29 NOTE — Progress Notes (Signed)
 Progress Note   Patient: [name] [name] FMW:[id number] DOB: January 16, 1945 DOA: 06/24/2023     5 DOS: the patient was seen and examined on 06/29/2023   Brief hospital course: ZAYAN DELVECCHIO is a 78 y.o. male with past medical history of severe COPD, chronic hypoxic respiratory failure on 4 L of oxygen  at baseline, rheumatoid arthritis, heart failure with preserved ejection fraction, CAD, hypertension, hyperlipidemia, mood disorder presented to hospital with progressive shortness of breath and dyspnea on exertion.  He had progressive dyspnea at the point that he was short of breath at rest and was desaturating and needed his oxygen  to be bumped up to 5 L.  EMS was called and patient was brought into the hospital on nonrebreather mask.  Has been having cough minimally productive for a week.  Patient was then admitted to the hospital for further evaluation and treatment    Assessment and Plan: Acute on chronic hypoxic respiratory failure, requiring BiPAP  Multifocal pneumonia COPD exacerbation, severe  Hx progressive SOB x 1 month, new cough x 1 week, and acute decompensation just PTA. Hypoxic on EMS arrival to 80% placed on NRB and then transitioned to midflow 12L but due to respiratory distress and work of breathing was placed on BiPAP subsequently.  Initially tachycardic.  Lactate 1.2.  Flu COVID RSV is negative.  Recent CT on 01/14/23 showed negative for PE.  Chest x-ray in the ED showed bilateral patchy airspace opacities in the left mid and right lower lung zones.   Continue BiPAP as needed. Supplemental oxygen  weaned to 3L today from high flow 7L. Solu-Medrol  tapered to oral Prednisone . Continue home Breztri , Spiriva, DuoNebs every 6 hours scheduled, albuterol  every 4 hours as needed,  Encourage incentive spirometer, flutter valve, out of bed to chair.   Blood cultures negative in 2 day.  Sputum grew Stenotrophomonas, antibiotics changed to Bactrim . Pulmonary rehab referral upon  discharge. PT/ OT advised SNF for STR.   Atypical chest pain  Hx intermittent chest pain over past month. EKG without acute ischemic changes. Troponin remained negative x2. Recent CTA negative for PE outpatient.  Possible musculoskeletal related.  Continue symptomatic care.   Hyponatremia, mild asymptomatic Improved. Continue to monitor. Latest 135.   Hypokalemia  Improved with supplementation.s   Hypomagnesemia.  Latest magnesium  2.0 after replacement.   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 Continue folate    HFpEF, without acute exacerbation: Euvolemic by exam, BNP 35.  Continue Lasix  daily.  Has been having good diuresis.  Negative fluid balance noted.   Was seen by Dr. Wadie in the past.  At this time we will continue with home dose Lasix  40mg  daily. Continue to monitor strict intake and output charting, daily weights.   CAD by imaging/ Hyperlipidemia  continue home statin    Hypertension: Resumed Amlodipine .   Mood disorder.  Continue Xanax  as needed sertraline .   Constipation and urinary retention.   Continue constipation regimen.   Debility, deconditioning.  PT/ OT recommended skilled nursing facility for STR placement.  TOC on board.     Out of bed to chair. Incentive spirometry. Nursing supportive care. Fall, aspiration precautions. Diet:  Diet Orders (From admission, onward)     Start     Ordered   06/25/23 1123  Diet regular Room service appropriate? Yes; Fluid consistency: Thin  Diet effective now       Question Answer Comment  Room service appropriate? Yes   Fluid consistency: Thin      06/25/23  1122           DVT prophylaxis: enoxaparin  (LOVENOX ) injection 40 mg Start: 06/25/23 1000  Level of care: Stepdown   Code Status: Full Code  Subjective: Patient is seen and examined today morning. I weaned his supplemental oxygen  from 5L to 3L saturating 94%. Was on Bipap last night. Admits constipation. Eating  fair.  Physical Exam: Vitals:   06/29/23 0630 06/29/23 0700 06/29/23 0800 06/29/23 0900  BP:  (!) 119/47 (!) 114/49 (!) 114/54  Pulse: 79 81 79 94  Resp: (!) 23 (!) 21 20 (!) 30  Temp:   97.8 F (36.6 C)   TempSrc:   Oral   SpO2: 99% 98% 99% 95%  Weight:      Height:        General - Elderly Caucasian male, mild respiratory distress HEENT - PERRLA, EOMI, atraumatic head, non tender sinuses. Lung - decreased, basal rales, rhonchi, diffuse wheezes. Heart - S1, S2 heard, no murmurs, rubs, trace pedal edema. Abdomen - Soft, non tender, bowel sounds good Neuro - Alert, awake and oriented x 3, non focal exam. Skin - Warm and dry.  Data Reviewed:      Latest Ref Rng & Units 06/28/2023    3:13 AM 06/27/2023    3:12 AM 06/26/2023    3:07 AM  CBC  WBC 4.0 - 10.5 K/uL 10.4  7.8  5.6   Hemoglobin 13.0 - 17.0 g/dL 86.7  87.7  87.4   Hematocrit 39.0 - 52.0 % 41.3  37.9  38.4   Platelets 150 - 400 K/uL 148  150  132       Latest Ref Rng & Units 06/28/2023    3:13 AM 06/27/2023    3:12 AM 06/26/2023    3:07 AM  BMP  Glucose 70 - 99 mg/dL 810  820  811   BUN 8 - 23 mg/dL 15  15  15    Creatinine 0.61 - 1.24 mg/dL 9.26  9.25  9.36   Sodium 135 - 145 mmol/L 135  135  133   Potassium 3.5 - 5.1 mmol/L 4.9  4.3  4.0   Chloride 98 - 111 mmol/L 93  94  93   CO2 22 - 32 mmol/L 31  31  31    Calcium  8.9 - 10.3 mg/dL 9.1  8.9  8.7    No results found.  Family Communication: Discussed with patient, he understand and agree. All questions answered.  Disposition: Status is: Inpatient Remains inpatient appropriate because: severity of illness  Planned Discharge Destination: Rehab    Time spent 39 min.  Author: Concepcion Riser, MD 06/29/2023 11:34 AM Secure chat 7am to 7pm For on call review [email or web address]

## 2023-06-29 NOTE — Plan of Care (Signed)

## 2023-06-29 NOTE — Plan of Care (Signed)

## 2023-06-29 NOTE — Consult Note (Signed)
 NAME:  [name] [name], MRN:  [id number], DOB:  1945-05-05, LOS: 5 ADMISSION DATE:  06/24/2023, CONSULTATION DATE:  06/29/2023  REFERRING MD:  TRH, Nagendranath, CHIEF COMPLAINT:  pneumonia   History of Present Illness:  78 yo heavy ex-smoker whom I follow in the office for COPD & chronic resp failure with hypoxia He smoked about a pack per day  until he quit in 2010- about 40 pack years. -on portable oxygen  since 03/2019 -uses 2 L continuous at home and  POC 3 to 4 L when he goes out He has been experiencing chest tightness since May and worsening hypoxia.  We tried prednisone  courses outpatient and increased diuresis.  CT angiogram was obtained 06/18/2023 which did not show any evidence of PE or pneumonia.  He was finally hospitalized on 6/16, required up to 5 L of oxygen  at home.  On EMS arrival, required nonrebreather, 12 L oxygen  and transient BiPAP.  Reported cough productive of minimal clear sputum.  Chest x-ray showed scattered patchy opacities He was admitted with presumptive diagnosis of multifocal pneumonia treated with ceftriaxone /azithromycin  and for COPD exacerbation with IV Solu-Medrol  RVP negative, MRSA PCR negative, sputum culture showed stenotrophomonas  Pertinent  Medical History  Rheumatoid arthritis -on weekly methotrexate  injections HFpEF CAD 03/2023 hospitalization  x 2 >>The first was due to a fall resulting in rib fractures, which caused pain and shortness of breath. The patient was initially on five liters of oxygen  but was weaned down to three liters. The patient was readmitted within a week due to fluid buildup and required transient BiPAP. He was diuresed about nine liters and his BNP level decreased from 136 to 99.   Significant Hospital Events: Including procedures, antibiotic start and stop dates in addition to other pertinent events     Interim History / Subjective:  Visibly short of breath while talking although he reports much improvement from  admission Afebrile  On 3 L nasal cannula, saturation 89 to 92%  Objective    Blood pressure (!) 114/54, pulse 94, temperature 97.8 F (36.6 C), temperature source Oral, resp. rate (!) 30, height 5' 11 (1.803 m), weight 93.4 kg, SpO2 95%.    FiO2 (%):  [40 %] 40 %   Intake/Output Summary (Last 24 hours) at 06/29/2023 1147 Last data filed at 06/29/2023 0900 Gross per 24 hour  Intake 480 ml  Output 1815 ml  Net -1335 ml   Filed Weights   06/24/23 2042 06/25/23 0134 06/26/23 0822  Weight: 91.6 kg 95.6 kg 93.4 kg    Examination: General: Elderly man, sitting up in bed, nondistressed HENT: No JVD, no pallor, no lymphadenopathy Lungs: Decreased breath sounds bilateral, mild accessory muscle use while talking in full sentences Cardiovascular: S1-S2 regular Abdomen: Soft, nontender, no hepatosplenomegaly  Extremities: No edema no deformity Neuro: Alert, interactive, nonfocal  Labs show normal electrolytes, no leukocytosis Sputum culture shows stenotrophomonas   Resolved problem list   Assessment and Plan   HAP -stenotrophomonas COPD without exacerbation Acute on chronic, hypoxic and hypercarbic respiratory failure  - It is curious that he developed multifocal pneumonia between his last scan and his current admission.  He has been feeling worse since May hindsight I wonder if this was related to stenotrophomonas.  I am glad that we have found an etiology that can be treated.  - Suggest Bactrim  for 10 to 14 days -Prednisone  taper over 2 weeks - Triple bronchodilator therapy - Oxygen  to maintain saturations of 88% and above appears to be close  to his baseline - He feels that NIV helped him initially but doubts that he will tolerate this so would not pursue home NIV at this time   Best Practice (right click and Reselect all SmartList Selections daily)    Code Status:  full code Last date of multidisciplinary goals of care discussion [NA]  Labs   CBC: Recent Labs   Lab 06/24/23 1930 06/24/23 1943 06/25/23 0312 06/26/23 0307 06/27/23 0312 06/28/23 0313  WBC 8.2  --  5.7 5.6 7.8 10.4  NEUTROABS 6.4  --   --   --   --   --   HGB 13.1 14.3 12.6* 12.5* 12.2* 13.2  HCT 39.8 42.0 38.8* 38.4* 37.9* 41.3  MCV 106.4*  --  109.3* 109.1* 108.9* 110.1*  PLT 141*  --  119* 132* 150 148*    Basic Metabolic Panel: Recent Labs  Lab 06/24/23 1930 06/24/23 1943 06/25/23 0312 06/26/23 0307 06/27/23 0312 06/28/23 0313  NA 131* 133* 134* 133* 135 135  K 3.2* 3.4* 4.3 4.0 4.3 4.9  CL 88* 86* 96* 93* 94* 93*  CO2 31  --  29 31 31 31   GLUCOSE 124* 123* 168* 188* 179* 189*  BUN 13 12 12 15 15 15   CREATININE 0.77 0.80 0.58* 0.63 0.74 0.73  CALCIUM  8.3*  --  8.3* 8.7* 8.9 9.1  MG  --   --  1.5* 2.1 2.2 2.0  PHOS  --   --  3.0  --   --   --    GFR: Estimated Creatinine Clearance: 93.1 mL/min (by C-G formula based on SCr of 0.73 mg/dL). Recent Labs  Lab 06/24/23 1943 06/25/23 0312 06/26/23 0307 06/27/23 0312 06/28/23 0313  WBC  --  5.7 5.6 7.8 10.4  LATICACIDVEN 1.2  --   --   --   --     Liver Function Tests: Recent Labs  Lab 06/24/23 1930  AST 22  ALT 16  ALKPHOS 107  BILITOT 0.7  PROT 6.7  ALBUMIN 3.1*   No results for input(s): LIPASE, AMYLASE in the last 168 hours. No results for input(s): AMMONIA in the last 168 hours.  ABG    Component Value Date/Time   HCO3 36.5 (H) 06/24/2023 2243   TCO2 34 (H) 06/24/2023 1943   O2SAT 96.1 06/24/2023 2243     Coagulation Profile: No results for input(s): INR, PROTIME in the last 168 hours.  Cardiac Enzymes: No results for input(s): CKTOTAL, CKMB, CKMBINDEX, TROPONINI in the last 168 hours.  HbA1C: No results found for: HGBA1C  CBG: No results for input(s): GLUCAP in the last 168 hours.  Review of Systems:   Constitutional: negative for anorexia, fevers and sweats  Eyes: negative for irritation, redness and visual disturbance  Ears, nose, mouth, throat, and  face: negative for earaches, epistaxis, nasal congestion and sore throat  Respiratory: + for cough, dyspnea on exertion, sputum and wheezing  Cardiovascular: negative for chest pain, dyspnea, lower extremity edema, orthopnea, palpitations and syncope  Gastrointestinal: negative for abdominal pain, constipation, diarrhea, melena, nausea and vomiting  Genitourinary:negative for dysuria, frequency and hematuria  Hematologic/lymphatic: negative for bleeding, easy bruising and lymphadenopathy  Musculoskeletal:negative for arthralgias, muscle weakness and stiff joints  Neurological: negative for coordination problems, gait problems, headaches and weakness  Endocrine: negative for diabetic symptoms including polydipsia, polyuria and weight loss   Past Medical History:  He,  has a past medical history of COPD (chronic obstructive pulmonary disease) (HCC), High cholesterol, and Hypertension.  Surgical History:   Past Surgical History:  Procedure Laterality Date   NASAL SINUS SURGERY  2007     Social History:   reports that he quit smoking about 15 years ago. His smoking use included cigarettes. He started smoking about 55 years ago. He has a 80 pack-year smoking history. He has never been exposed to tobacco smoke. He has never used smokeless tobacco. He reports current alcohol use of about 12.0 standard drinks of alcohol per week. He reports that he does not use drugs.   Family History:  His family history includes Esophageal cancer in his father.   Allergies Allergies  Allergen Reactions   Fluticasone -Salmeterol Shortness Of Breath and Other (See Comments)    Worsening shortness of breath..     Home Medications  Prior to Admission medications   Medication Sig Start Date End Date Taking? Authorizing Provider  albuterol  (VENTOLIN  HFA) 108 (90 Base) MCG/ACT inhaler Inhale 2 puffs into the lungs every 6 (six) hours as needed for wheezing or shortness of breath. Patient taking  differently: Inhale 2 puffs into the lungs every 4 (four) hours as needed for shortness of breath or wheezing. 12/08/21  Yes Jude Harden GAILS, MD  ALPRAZolam  (XANAX ) 0.25 MG tablet Take 0.25 mg by mouth daily as needed for sleep or anxiety.   Yes [provider]  amLODipine  (NORVASC ) 5 MG tablet Take 5 mg by mouth daily. 11/23/22  Yes [provider]  atorvastatin  (LIPITOR) 20 MG tablet Take 20 mg by mouth daily. 07/01/22  Yes [provider]  Budeson-Glycopyrrol-Formoterol  (BREZTRI  AEROSPHERE) 160-9-4.8 MCG/ACT AERO Inhale 2 puffs into the lungs in the morning and at bedtime. 02/11/23  Yes Hope Almarie ORN, NP  folic acid  (FOLVITE ) 1 MG tablet Take 1 mg by mouth daily. 11/01/22  Yes [provider]  furosemide  (LASIX ) 40 MG tablet Take 40 mg by mouth in the morning. 03/06/22  Yes [provider]  ipratropium-albuterol  (DUONEB) 0.5-2.5 (3) MG/3ML SOLN Take 3 mLs by nebulization daily. 03/29/23  Yes [provider]  methocarbamol  (ROBAXIN ) 500 MG tablet Take 1 tablet (500 mg total) by mouth every 8 (eight) hours as needed for muscle spasms. 03/24/23  Yes Vernon Ranks, MD  methotrexate  50 MG/2ML injection Inject 20 mg into the skin every Sunday.   Yes [provider]  Multiple Vitamins-Minerals (MULTIVITAMIN WITH MINERALS) tablet Take 1 tablet by mouth daily with breakfast.   Yes [provider]  naproxen (NAPROSYN) 500 MG tablet Take 500 mg by mouth 2 (two) times daily with a meal.   Yes [provider]  omeprazole (PRILOSEC OTC) 20 MG tablet Take 20 mg by mouth daily before breakfast.   Yes [provider]  OXYGEN  Inhale 4 L/min into the lungs continuous.   Yes [provider]  polyethylene glycol (MIRALAX  / GLYCOLAX ) 17 g packet Take 17 g by mouth daily as needed for mild constipation. 07/09/22  Yes Shalhoub, Zachary PARAS, MD  Potassium Chloride  ER 20 MEQ TBCR Take 20 mEq by mouth daily. 02/02/22  Yes [provider]  sertraline  (ZOLOFT ) 100 MG tablet Take 100 mg by mouth daily. 02/22/23  Yes [provider]  TYLENOL  500 MG tablet Take 500 mg by mouth every 8 (eight) hours as needed for mild pain (pain score 1-3) or headache.   Yes [provider]  vitamin B-12 (CYANOCOBALAMIN ) 1000 MCG tablet Take 1,000 mcg by mouth daily.   Yes [provider]  hydrOXYzine  (ATARAX ) 25 MG tablet TAKE  1 TABLET BY MOUTH EVERY 8 HOURS AS NEEDED FOR ANXIETY. Patient not taking: Reported on 06/24/2023 11/23/22   Hope Almarie ORN, NP  predniSONE  (DELTASONE ) 10 MG tablet 4 tabs x 5 days Patient not taking: Reported on 06/24/2023 06/14/23   Hope Almarie ORN, NP      Harden Staff MD. FCCP. [facility] Pager : [phone]  If no response to pager , please call [phone] until 7 pm After 7:00 pm call Elink  [phone]   06/29/2023

## 2023-06-29 NOTE — Progress Notes (Signed)
 Physical Therapy Treatment Patient Details Name: [name] [name] MRN: [id number] DOB: [birth year] Today's Date: 06/29/2023   History of Present Illness [name] [name] is a 78 y.o. male presented 06/24/23 with progressive SOB requiring NRB mask. Found to have multifocal pneumonia, COPD exacerbation, hyponatremia. EFY:dzczmz COPD, chronic hypoxic respiratory failure on 4 L of oxygen  at baseline, rheumatoid arthritis, heart failure with preserved ejection fraction, CAD, hypertension, hyperlipidemia, mood disorder    PT Comments  Pt very motivated and with noted progress with activity tolerance this am.  Pt requiring increased time and several standing rest breaks to ambulate 75' twice with seated rest break between.  Pt on 6L O2 but with very unstable pleth on sat monitor varying between 72 and 94 and not correlating with pts level of dyspnea.  Pt pleased with progress and up in chair for bfast at session end.   If plan is discharge home, recommend the following: A little help with walking and/or transfers;Assistance with cooking/housework;A little help with bathing/dressing/bathroom;Help with stairs or ramp for entrance;Assist for transportation   Can travel by private vehicle     Yes  Equipment Recommendations  None recommended by PT    Recommendations for Other Services       Precautions / Restrictions Precautions Precautions: Fall Precaution/Restrictions Comments: monitor sats, very SOB Restrictions Weight Bearing Restrictions Per Provider Order: No     Mobility  Bed Mobility Overal bed mobility: Needs Assistance Bed Mobility: Supine to Sit     Supine to sit: Contact guard     General bed mobility comments: for safety    Transfers Overall transfer level: Needs assistance Equipment used: Rolling walker (2 wheels) Transfers: Sit to/from Stand Sit to Stand: Contact guard assist           General transfer comment: pt required min cues and able to push to stand  from EOB to RW    Ambulation/Gait Ambulation/Gait assistance: Contact guard assist Gait Distance (Feet): 75 Feet (75' twice with seated rest break) Assistive device: Rolling walker (2 wheels) Gait Pattern/deviations: Step-through pattern, Trunk flexed Gait velocity: decreased     General Gait Details: slight trunk flexion, short stride length and minimal foot clearance, min cues for posture and proper distance from Rw. pt on 6 L/min with mobility tasks   Stairs             Wheelchair Mobility     Tilt Bed    Modified Rankin (Stroke Patients Only)       Balance Overall balance assessment: Needs assistance Sitting-balance support: No upper extremity supported, Feet supported Sitting balance-Leahy Scale: Good     Standing balance support: During functional activity, Bilateral upper extremity supported, Reliant on assistive device for balance Standing balance-Leahy Scale: Fair                              Hotel manager: No apparent difficulties  Cognition Arousal: Alert Behavior During Therapy: WFL for tasks assessed/performed                             Following commands: Intact      Cueing    Exercises      General Comments        Pertinent Vitals/Pain Pain Assessment Pain Assessment: No/denies pain Faces Pain Scale: Hurts a little bit Pain Location: abdomen Pain Descriptors / Indicators: Grimacing, Discomfort Pain Intervention(s): Limited activity within  patient's tolerance    Home Living                          Prior Function            PT Goals (current goals can now be found in the care plan section) Acute Rehab PT Goals Patient Stated Goal: to walk , go home PT Goal Formulation: With patient Time For Goal Achievement: 07/10/23 Potential to Achieve Goals: Good Progress towards PT goals: Progressing toward goals    Frequency    Min 3X/week      PT Plan       Co-evaluation              AM-PAC PT 6 Clicks Mobility   Outcome Measure  Help needed turning from your back to your side while in a flat bed without using bedrails?: None Help needed moving from lying on your back to sitting on the side of a flat bed without using bedrails?: None Help needed moving to and from a bed to a chair (including a wheelchair)?: None Help needed standing up from a chair using your arms (e.g., wheelchair or bedside chair)?: A Little Help needed to walk in hospital room?: A Little Help needed climbing 3-5 steps with a railing? : Total 6 Click Score: 19    End of Session Equipment Utilized During Treatment: Oxygen ;Gait belt Activity Tolerance: Patient limited by fatigue Patient left: in chair;with call bell/phone within reach;with chair alarm set Nurse Communication: Mobility status PT Visit Diagnosis: Unsteadiness on feet (R26.81);Muscle weakness (generalized) (M62.81)     Time: 9176-9151 PT Time Calculation (min) (ACUTE ONLY): 25 min  Charges:    $Gait Training: 23-37 mins PT General Charges $$ ACUTE PT VISIT: 1 Visit                     Katrinka Acton PT Acute Rehabilitation Services Pager [phone] Office [phone]    Shekela Goodridge 06/29/2023, 2:56 PM

## 2023-06-30 DIAGNOSIS — J9621 Acute and chronic respiratory failure with hypoxia: Secondary | ICD-10-CM | POA: Diagnosis not present

## 2023-06-30 DIAGNOSIS — J441 Chronic obstructive pulmonary disease with (acute) exacerbation: Secondary | ICD-10-CM | POA: Diagnosis not present

## 2023-06-30 DIAGNOSIS — J189 Pneumonia, unspecified organism: Secondary | ICD-10-CM | POA: Diagnosis not present

## 2023-06-30 DIAGNOSIS — E871 Hypo-osmolality and hyponatremia: Secondary | ICD-10-CM | POA: Diagnosis not present

## 2023-06-30 LAB — CULTURE, BLOOD (ROUTINE X 2): Culture: NO GROWTH

## 2023-06-30 MED ORDER — SENNOSIDES-DOCUSATE SODIUM 8.6-50 MG PO TABS
2.0000 | ORAL_TABLET | Freq: Every day | ORAL | Status: DC
  Administered 2023-06-30: 2 via ORAL
  Filled 2023-06-30: qty 2

## 2023-06-30 MED ORDER — DOCUSATE SODIUM 100 MG PO CAPS
100.0000 mg | ORAL_CAPSULE | Freq: Two times a day (BID) | ORAL | Status: DC
  Administered 2023-06-30 – 2023-07-01 (×3): 100 mg via ORAL
  Filled 2023-06-30 (×3): qty 1

## 2023-06-30 NOTE — Plan of Care (Signed)
  Problem: Education: Goal: Knowledge of General Education information will improve Description Including pain rating scale, medication(s)/side effects and non-pharmacologic comfort measures Outcome: Progressing   Problem: Health Behavior/Discharge Planning: Goal: Ability to manage health-related needs will improve Outcome: Progressing   Problem: Clinical Measurements: Goal: Ability to maintain clinical measurements within normal limits will improve Outcome: Progressing Goal: Diagnostic test results will improve Outcome: Progressing Goal: Respiratory complications will improve Outcome: Progressing Goal: Cardiovascular complication will be avoided Outcome: Progressing   Problem: Activity: Goal: Risk for activity intolerance will decrease Outcome: Progressing   Problem: Nutrition: Goal: Adequate nutrition will be maintained Outcome: Progressing   Problem: Coping: Goal: Level of anxiety will decrease Outcome: Progressing

## 2023-06-30 NOTE — Progress Notes (Signed)
 NAME:  [name] [name], MRN:  [id number], DOB:  06-05-1945, LOS: 6 ADMISSION DATE:  06/24/2023, CONSULTATION DATE:  06/30/2023  REFERRING MD:  TRH, Nagendranath, CHIEF COMPLAINT:  pneumonia   History of Present Illness:  78 yo heavy ex-smoker whom I follow in the office for COPD & chronic resp failure with hypoxia He smoked about a pack per day  until he quit in 2010- about 40 pack years. -on portable oxygen  since 03/2019 -uses 2 L continuous at home and  POC 3 to 4 L when he goes out He has been experiencing chest tightness since May and worsening hypoxia.  We tried prednisone  courses outpatient and increased diuresis.  CT angiogram was obtained 06/18/2023 which did not show any evidence of PE or pneumonia.  He was finally hospitalized on 6/16, required up to 5 L of oxygen  at home.  On EMS arrival, required nonrebreather, 12 L oxygen  and transient BiPAP.  Reported cough productive of minimal clear sputum.  Chest x-ray showed scattered patchy opacities He was admitted with presumptive diagnosis of multifocal pneumonia treated with ceftriaxone /azithromycin  and for COPD exacerbation with IV Solu-Medrol  RVP negative, MRSA PCR negative, sputum culture showed stenotrophomonas  Pertinent  Medical History  Rheumatoid arthritis -on weekly methotrexate  injections HFpEF CAD 03/2023 hospitalization  x 2 >>The first was due to a fall resulting in rib fractures, which caused pain and shortness of breath. The patient was initially on five liters of oxygen  but was weaned down to three liters. The patient was readmitted within a week due to fluid buildup and required transient BiPAP. He was diuresed about nine liters and his BNP level decreased from 136 to 99.   Significant Hospital Events: Including procedures, antibiotic start and stop dates in addition to other pertinent events     Interim History / Subjective:  Back up to 6 L nasal cannula Feels better Cough is decreasing, minimal white  sputum  Objective    Blood pressure (!) 122/46, pulse 84, temperature 97.9 F (36.6 C), temperature source Oral, resp. rate (!) 32, height 5' 11 (1.803 m), weight 93.4 kg, SpO2 95%.    FiO2 (%):  [35 %-40 %] 35 %   Intake/Output Summary (Last 24 hours) at 06/30/2023 1000 Last data filed at 06/30/2023 0700 Gross per 24 hour  Intake --  Output 1550 ml  Net -1550 ml   Filed Weights   06/24/23 2042 06/25/23 0134 06/26/23 0822  Weight: 91.6 kg 95.6 kg 93.4 kg    Examination: General: Elderly man, sitting up in bed, no distress, receiving neb HENT: No JVD, no pallor, no lymphadenopathy Lungs: Decreased breath sounds bilateral, no rhonchi, decreased accessory muscle use Cardiovascular: S1-S2 regular Abdomen: Soft, nontender, no hepatosplenomegaly  Extremities: No edema no deformity Neuro: Alert, interactive, nonfocal, no asterixis  Labs show normal electrolytes, no leukocytosis Sputum culture shows stenotrophomonas   Resolved problem list   Assessment and Plan   HAP -stenotrophomonas COPD without exacerbation Acute on chronic, hypoxic and hypercarbic respiratory failure  - It is curious that he developed multifocal pneumonia between his clear CT scan on 6/10 and now.  He has been feeling worse since May hindsight I wonder if this was related to stenotrophomonas.   - Suggest Bactrim  for 10 to 14 days -Prednisone  taper over 2 weeks - Triple bronchodilator therapy - Oxygen  to maintain saturations of 88% and above appears to be close to his baseline - He feels that NIV helped him initially but doubts that he will tolerate this long-term,  in fact not able to use it much last night- so would not pursue home NIV at this time - Continue to taper oxygen , maintain saturation 90% and above  PCCM will be available as needed    Best Practice (right click and Reselect all SmartList Selections daily)    Code Status:  full code Last date of multidisciplinary goals of care  discussion [NA]  Labs   CBC: Recent Labs  Lab 06/24/23 1930 06/24/23 1943 06/25/23 0312 06/26/23 0307 06/27/23 0312 06/28/23 0313  WBC 8.2  --  5.7 5.6 7.8 10.4  NEUTROABS 6.4  --   --   --   --   --   HGB 13.1 14.3 12.6* 12.5* 12.2* 13.2  HCT 39.8 42.0 38.8* 38.4* 37.9* 41.3  MCV 106.4*  --  109.3* 109.1* 108.9* 110.1*  PLT 141*  --  119* 132* 150 148*    Basic Metabolic Panel: Recent Labs  Lab 06/24/23 1930 06/24/23 1943 06/25/23 0312 06/26/23 0307 06/27/23 0312 06/28/23 0313  NA 131* 133* 134* 133* 135 135  K 3.2* 3.4* 4.3 4.0 4.3 4.9  CL 88* 86* 96* 93* 94* 93*  CO2 31  --  29 31 31 31   GLUCOSE 124* 123* 168* 188* 179* 189*  BUN 13 12 12 15 15 15   CREATININE 0.77 0.80 0.58* 0.63 0.74 0.73  CALCIUM  8.3*  --  8.3* 8.7* 8.9 9.1  MG  --   --  1.5* 2.1 2.2 2.0  PHOS  --   --  3.0  --   --   --    GFR: Estimated Creatinine Clearance: 93.1 mL/min (by C-G formula based on SCr of 0.73 mg/dL). Recent Labs  Lab 06/24/23 1943 06/25/23 0312 06/26/23 0307 06/27/23 0312 06/28/23 0313  WBC  --  5.7 5.6 7.8 10.4  LATICACIDVEN 1.2  --   --   --   --     Liver Function Tests: Recent Labs  Lab 06/24/23 1930  AST 22  ALT 16  ALKPHOS 107  BILITOT 0.7  PROT 6.7  ALBUMIN 3.1*   No results for input(s): LIPASE, AMYLASE in the last 168 hours. No results for input(s): AMMONIA in the last 168 hours.  ABG    Component Value Date/Time   HCO3 36.5 (H) 06/24/2023 2243   TCO2 34 (H) 06/24/2023 1943   O2SAT 96.1 06/24/2023 2243     Coagulation Profile: No results for input(s): INR, PROTIME in the last 168 hours.  Cardiac Enzymes: No results for input(s): CKTOTAL, CKMB, CKMBINDEX, TROPONINI in the last 168 hours.  HbA1C: No results found for: HGBA1C  CBG: No results for input(s): GLUCAP in the last 168 hours.   Harden Staff MD. DEBE. [facility] Pager : [phone]  If no response to pager , please call [phone]  until 7 pm After 7:00 pm call Elink  [phone]   06/30/2023

## 2023-06-30 NOTE — Progress Notes (Signed)
 Progress Note   Patient: [name] [name] FMW:[id number] DOB: 01/29/1945 DOA: 06/24/2023     6 DOS: the patient was seen and examined on 06/30/2023   Brief hospital course: [name] [name] is a 78 y.o. male with past medical history of severe COPD, chronic hypoxic respiratory failure on 4 L of oxygen  at baseline, rheumatoid arthritis, heart failure with preserved ejection fraction, CAD, hypertension, hyperlipidemia, mood disorder presented to hospital with progressive shortness of breath and dyspnea on exertion.  He had progressive dyspnea at the point that he was short of breath at rest and was desaturating and needed his oxygen  to be bumped up to 5 L.  EMS was called and patient was brought into the hospital on nonrebreather mask.  Has been having cough minimally productive for a week.  Patient was then admitted to the hospital for further evaluation and treatment.    Assessment and Plan: Acute on chronic hypoxic respiratory failure, requiring BiPAP  Multifocal pneumonia COPD exacerbation, severe  Hx progressive SOB x 1 month, new cough x 1 week, and acute decompensation just PTA. Hypoxic on EMS arrival to 80% placed on NRB and then transitioned to midflow 12L but due to respiratory distress and work of breathing was placed on BiPAP subsequently.  Initially tachycardic.  Lactate 1.2.  Flu COVID RSV is negative.  Recent CT on 01/14/23 showed negative for PE.  Chest x-ray in the ED showed bilateral patchy airspace opacities in the left mid and right lower lung zones.   Continue BiPAP as needed. Supplemental oxygen  weaned to 3L today from high flow 7L. Solu-Medrol  tapered to oral Prednisone . Taper over 2 weeks. Continue home Breztri , Spiriva, DuoNebs every 6 hours scheduled, albuterol  every 4 hours as needed,  Blood cultures negative in 2 day.  Sputum grew Stenotrophomonas, continue Bactrim  for 10-14days. Encourage incentive spirometer, flutter valve, out of bed to chair.     Atypical chest  pain  Hx intermittent chest pain over past month. EKG without acute ischemic changes. Troponin remained negative x2. Recent CTA negative for PE outpatient.  Possible musculoskeletal related.  Continue symptomatic care.   Hyponatremia, mild  Improved. Continue to monitor. Latest 135.   Hypokalemia  Improved with supplementation. Check K level as he is on bactrim  therapy.   Hypomagnesemia.  Latest magnesium  2.0 after replacement.   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 Continue folate    HFpEF, without acute exacerbation: Euvolemic by exam, BNP 35.  Continue Lasix  daily.  Has been having good diuresis.  Negative fluid balance noted.   Was seen by Dr. Wadie in the past.  Continue home dose Lasix  40mg  daily. Continue to monitor strict intake and output charting, daily weights.   CAD by imaging/ Hyperlipidemia  continue home statin    Hypertension: BP stable, continue Amlodipine .   Mood disorder.  Continue Xanax  as needed sertraline .   Constipation and urinary retention.   Daily scheduled constipation regimen ordered.   Debility, deconditioning.  PT/ OT recommended skilled nursing facility for STR placement.  TOC on board.     Out of bed to chair. Incentive spirometry. Nursing supportive care. Fall, aspiration precautions. Diet:  Diet Orders (From admission, onward)     Start     Ordered   06/25/23 1123  Diet regular Room service appropriate? Yes; Fluid consistency: Thin  Diet effective now       Question Answer Comment  Room service appropriate? Yes   Fluid consistency: Thin      06/25/23  1122           DVT prophylaxis: enoxaparin  (LOVENOX ) injection 40 mg Start: 06/25/23 1000  Level of care: Stepdown   Code Status: Full Code  Subjective: Patient is seen and examined today morning. Continues to be on 5L supplemental o2, yesterday he was on 3L most of day. Asks for anxiety medication. Used BIPAP at night but only few hours.  Had a bowel movement today.  Physical Exam: Vitals:   06/30/23 0800 06/30/23 0830 06/30/23 0900 06/30/23 1000  BP: (!) 122/46  (!) 121/46 115/60  Pulse: 84  94 (!) 102  Resp: (!) 32  (!) 27 (!) 36  Temp:      TempSrc:      SpO2: 93% 95% (!) 88% 92%  Weight:      Height:        General - Elderly Caucasian male, mild respiratory distress HEENT - PERRLA, EOMI, atraumatic head, non tender sinuses. Lung - decreased, basal rales, rhonchi, diffuse wheezes. Heart - S1, S2 heard, no murmurs, rubs, trace pedal edema. Abdomen - Soft, non tender, bowel sounds good Neuro - Alert, awake and oriented x 3, non focal exam. Skin - Warm and dry.  Data Reviewed:      Latest Ref Rng & Units 06/28/2023    3:13 AM 06/27/2023    3:12 AM 06/26/2023    3:07 AM  CBC  WBC 4.0 - 10.5 K/uL 10.4  7.8  5.6   Hemoglobin 13.0 - 17.0 g/dL 86.7  87.7  87.4   Hematocrit 39.0 - 52.0 % 41.3  37.9  38.4   Platelets 150 - 400 K/uL 148  150  132       Latest Ref Rng & Units 06/28/2023    3:13 AM 06/27/2023    3:12 AM 06/26/2023    3:07 AM  BMP  Glucose 70 - 99 mg/dL 810  820  811   BUN 8 - 23 mg/dL 15  15  15    Creatinine 0.61 - 1.24 mg/dL 9.26  9.25  9.36   Sodium 135 - 145 mmol/L 135  135  133   Potassium 3.5 - 5.1 mmol/L 4.9  4.3  4.0   Chloride 98 - 111 mmol/L 93  94  93   CO2 22 - 32 mmol/L 31  31  31    Calcium  8.9 - 10.3 mg/dL 9.1  8.9  8.7    No results found.  Family Communication: Discussed with patient, he understand and agree. All questions answered.  Disposition: Status is: Inpatient Remains inpatient appropriate because: severity of illness, red mews.  Planned Discharge Destination: Rehab    Time spent 40 min.  Author: Concepcion Riser, MD 06/30/2023 10:56 AM Secure chat 7am to 7pm For on call review [email or web address].

## 2023-06-30 NOTE — Plan of Care (Signed)
 [name] [name] states he's feeling somewhat better but still decompensates rapidly with any exertion and requires several minutes to recover.  He states he's weaker than he was yesterday as well.

## 2023-06-30 NOTE — Progress Notes (Signed)
   06/30/23 0236  BiPAP/CPAP/SIPAP  $ Non-Invasive Ventilator  Non-Invasive Vent Subsequent  BiPAP/CPAP/SIPAP Pt Type Adult  BiPAP/CPAP/SIPAP V60  Mask Type Full face mask  Dentures removed? Yes - Placed in denture cup  Mask Size Large  Set Rate 16 breaths/min  Respiratory Rate 28 breaths/min  IPAP 12 cmH20  EPAP 6 cmH2O  FiO2 (%) 35 %  Peak Inspiratory Pressure (PIP) 14  Tidal Volume (Vt) 593  Patient Home Machine No  Patient Home Mask No  Patient Home Tubing No  Auto Titrate No  Press High Alarm 45 cmH2O  Press Low Alarm 8 cmH2O  Device Plugged into RED Power Outlet Yes  BiPAP/CPAP /SiPAP Vitals  Pulse Rate 86  Resp (!) 30  SpO2 99 %  MEWS Score/Color  MEWS Score 3  MEWS Score Color Yellow

## 2023-07-01 DIAGNOSIS — F410 Panic disorder [episodic paroxysmal anxiety] without agoraphobia: Secondary | ICD-10-CM

## 2023-07-01 DIAGNOSIS — I959 Hypotension, unspecified: Secondary | ICD-10-CM | POA: Diagnosis not present

## 2023-07-01 DIAGNOSIS — R2689 Other abnormalities of gait and mobility: Secondary | ICD-10-CM | POA: Diagnosis not present

## 2023-07-01 DIAGNOSIS — T380X5A Adverse effect of glucocorticoids and synthetic analogues, initial encounter: Secondary | ICD-10-CM | POA: Diagnosis present

## 2023-07-01 DIAGNOSIS — R739 Hyperglycemia, unspecified: Secondary | ICD-10-CM | POA: Diagnosis present

## 2023-07-01 DIAGNOSIS — J9611 Chronic respiratory failure with hypoxia: Secondary | ICD-10-CM | POA: Diagnosis not present

## 2023-07-01 DIAGNOSIS — J441 Chronic obstructive pulmonary disease with (acute) exacerbation: Secondary | ICD-10-CM | POA: Diagnosis present

## 2023-07-01 DIAGNOSIS — E785 Hyperlipidemia, unspecified: Secondary | ICD-10-CM | POA: Diagnosis not present

## 2023-07-01 DIAGNOSIS — K219 Gastro-esophageal reflux disease without esophagitis: Secondary | ICD-10-CM | POA: Diagnosis not present

## 2023-07-01 DIAGNOSIS — M069 Rheumatoid arthritis, unspecified: Secondary | ICD-10-CM | POA: Diagnosis present

## 2023-07-01 DIAGNOSIS — I2510 Atherosclerotic heart disease of native coronary artery without angina pectoris: Secondary | ICD-10-CM | POA: Diagnosis present

## 2023-07-01 DIAGNOSIS — Z87891 Personal history of nicotine dependence: Secondary | ICD-10-CM | POA: Diagnosis not present

## 2023-07-01 DIAGNOSIS — I5032 Chronic diastolic (congestive) heart failure: Secondary | ICD-10-CM | POA: Diagnosis present

## 2023-07-01 DIAGNOSIS — E7800 Pure hypercholesterolemia, unspecified: Secondary | ICD-10-CM | POA: Diagnosis present

## 2023-07-01 DIAGNOSIS — R0689 Other abnormalities of breathing: Secondary | ICD-10-CM | POA: Diagnosis not present

## 2023-07-01 DIAGNOSIS — E782 Mixed hyperlipidemia: Secondary | ICD-10-CM | POA: Diagnosis not present

## 2023-07-01 DIAGNOSIS — J439 Emphysema, unspecified: Secondary | ICD-10-CM | POA: Diagnosis not present

## 2023-07-01 DIAGNOSIS — R918 Other nonspecific abnormal finding of lung field: Secondary | ICD-10-CM | POA: Diagnosis not present

## 2023-07-01 DIAGNOSIS — M6281 Muscle weakness (generalized): Secondary | ICD-10-CM | POA: Diagnosis not present

## 2023-07-01 DIAGNOSIS — J9621 Acute and chronic respiratory failure with hypoxia: Secondary | ICD-10-CM | POA: Diagnosis present

## 2023-07-01 DIAGNOSIS — Z79899 Other long term (current) drug therapy: Secondary | ICD-10-CM | POA: Diagnosis not present

## 2023-07-01 DIAGNOSIS — R0602 Shortness of breath: Secondary | ICD-10-CM | POA: Diagnosis not present

## 2023-07-01 DIAGNOSIS — Z7401 Bed confinement status: Secondary | ICD-10-CM | POA: Diagnosis not present

## 2023-07-01 DIAGNOSIS — Z9981 Dependence on supplemental oxygen: Secondary | ICD-10-CM | POA: Diagnosis not present

## 2023-07-01 DIAGNOSIS — K5909 Other constipation: Secondary | ICD-10-CM | POA: Diagnosis present

## 2023-07-01 DIAGNOSIS — F32A Depression, unspecified: Secondary | ICD-10-CM | POA: Diagnosis not present

## 2023-07-01 DIAGNOSIS — R278 Other lack of coordination: Secondary | ICD-10-CM | POA: Diagnosis not present

## 2023-07-01 DIAGNOSIS — R0902 Hypoxemia: Secondary | ICD-10-CM | POA: Diagnosis not present

## 2023-07-01 DIAGNOSIS — I5033 Acute on chronic diastolic (congestive) heart failure: Secondary | ICD-10-CM | POA: Diagnosis not present

## 2023-07-01 DIAGNOSIS — F411 Generalized anxiety disorder: Secondary | ICD-10-CM | POA: Diagnosis present

## 2023-07-01 DIAGNOSIS — J90 Pleural effusion, not elsewhere classified: Secondary | ICD-10-CM | POA: Diagnosis not present

## 2023-07-01 DIAGNOSIS — I10 Essential (primary) hypertension: Secondary | ICD-10-CM | POA: Diagnosis not present

## 2023-07-01 DIAGNOSIS — Z8701 Personal history of pneumonia (recurrent): Secondary | ICD-10-CM | POA: Diagnosis not present

## 2023-07-01 DIAGNOSIS — R000 Tachycardia, unspecified: Secondary | ICD-10-CM | POA: Diagnosis not present

## 2023-07-01 DIAGNOSIS — J189 Pneumonia, unspecified organism: Secondary | ICD-10-CM | POA: Diagnosis not present

## 2023-07-01 DIAGNOSIS — R531 Weakness: Secondary | ICD-10-CM | POA: Diagnosis present

## 2023-07-01 DIAGNOSIS — G621 Alcoholic polyneuropathy: Secondary | ICD-10-CM | POA: Diagnosis not present

## 2023-07-01 DIAGNOSIS — E871 Hypo-osmolality and hyponatremia: Secondary | ICD-10-CM | POA: Diagnosis not present

## 2023-07-01 DIAGNOSIS — F419 Anxiety disorder, unspecified: Secondary | ICD-10-CM | POA: Diagnosis not present

## 2023-07-01 DIAGNOSIS — K5900 Constipation, unspecified: Secondary | ICD-10-CM | POA: Diagnosis not present

## 2023-07-01 DIAGNOSIS — I110 Hypertensive heart disease with heart failure: Secondary | ICD-10-CM | POA: Diagnosis present

## 2023-07-01 LAB — BASIC METABOLIC PANEL WITH GFR
Anion gap: 10 (ref 5–15)
BUN: 20 mg/dL (ref 8–23)
CO2: 32 mmol/L (ref 22–32)
Calcium: 8.9 mg/dL (ref 8.9–10.3)
Chloride: 94 mmol/L — ABNORMAL LOW (ref 98–111)
Creatinine, Ser: 0.76 mg/dL (ref 0.61–1.24)
GFR, Estimated: >60 mL/min (ref >60)
Glucose, Bld: 103 mg/dL — ABNORMAL HIGH (ref 70–99)
Potassium: 4.2 mmol/L (ref 3.5–5.1)
Sodium: 136 mmol/L (ref 135–145)

## 2023-07-01 MED ORDER — ALPRAZOLAM 0.25 MG PO TABS: 0.2500 mg | ORAL_TABLET | Freq: Three times a day (TID) | ORAL | 0 refills | Status: DC | PRN

## 2023-07-01 MED ORDER — DOCUSATE SODIUM 100 MG PO CAPS
100.0000 mg | ORAL_CAPSULE | Freq: Two times a day (BID) | ORAL | 0 refills | Status: DC
Start: 2023-07-01 — End: 2023-09-14

## 2023-07-01 MED ORDER — SALINE SPRAY 0.65 % NA SOLN
1.0000 | NASAL | Status: DC | PRN
  Filled 2023-07-01: qty 44

## 2023-07-01 MED ORDER — PREDNISONE 10 MG PO TABS: ORAL_TABLET | ORAL | 0 refills | Status: DC

## 2023-07-01 MED ORDER — MELATONIN 3 MG PO TABS: 3.0000 mg | ORAL_TABLET | Freq: Every evening | ORAL | 0 refills | Status: AC | PRN

## 2023-07-01 MED ORDER — SENNOSIDES-DOCUSATE SODIUM 8.6-50 MG PO TABS: 2.0000 | ORAL_TABLET | Freq: Every day | ORAL | 0 refills | Status: DC

## 2023-07-01 MED ORDER — SULFAMETHOXAZOLE-TRIMETHOPRIM 800-160 MG PO TABS: 1.0000 | ORAL_TABLET | Freq: Two times a day (BID) | ORAL | 0 refills | Status: DC

## 2023-07-01 NOTE — Discharge Summary (Signed)
 Physician Discharge Summary   Patient: [name] [name] MRN: [id number] DOB: Jul 10, 1945  Admit date:     06/24/2023  Discharge date: 07/01/23  Discharge Physician: Concepcion Riser   PCP: Regino Slater, MD   Recommendations at discharge:   PCP follow up in 1 week. Pulmonology follow up as scheduled  Discharge Diagnoses: Principal Problem:   Multifocal pneumonia Active Problems:   Acute on chronic hypoxic respiratory failure (HCC)   Essential hypertension   Generalized anxiety disorder with panic attacks   GERD without esophagitis   Chronic respiratory failure with hypoxia (HCC)   Hyperlipidemia   COPD with acute exacerbation (HCC)   Hyponatremia  Resolved Problems:   * No resolved hospital problems. *  Hospital Course: [name] [name] is a 79 y.o. male with past medical history of severe COPD, chronic hypoxic respiratory failure on 4 L of oxygen  at baseline, rheumatoid arthritis, heart failure with preserved ejection fraction, CAD, hypertension, hyperlipidemia, mood disorder presented to hospital with progressive shortness of breath and dyspnea on exertion.  He had progressive dyspnea at the point that he was short of breath at rest and was desaturating and needed his oxygen  to be bumped up to 5 L.  EMS was called and patient was brought into the hospital on nonrebreather mask.  Has been having cough minimally productive for a week.  Patient was then admitted to the hospital for further evaluation and treatment.  Patient admitted to SDU for close respiratory monitoring. He initially required high flow oxygen  12L, BIPAP, placed on Rocephin , azithro. Continued Solu-Medrol , home Breztri , Spiriva, DuoNebs every 6 hours as needed, incentive spirometer, flutter valve, encouraged out of bed to chair. He is also given anxiolytics. Patient gradually improved, sputum cultures grew stenotrophomonas, abx changed ot bactrim  for 14 days. Bipap tapered off, oxygen  weaned to 3-5L.  Steroids taper for 2 weeks per pulmonary, maintain saturation around 90%. Seen by PT/ OT recommended STR. Patient is hemodynamically stable to be discharged to SNF for rehab.    Assessment and Plan: Acute on chronic hypoxic respiratory failure, required HFNC, BiPAP. Multifocal pneumonia COPD exacerbation, severe  Hx progressive SOB x 1 month, new cough x 1 week, and acute decompensation just PTA. Hypoxic on EMS arrival to 80% placed on NRB and then transitioned to midflow 12L but due to respiratory distress and work of breathing was placed on BiPAP subsequently.  Initially tachycardic.  Lactate 1.2.  Flu COVID RSV is negative.  Recent CT on 01/14/23 showed negative for PE.  Chest x-ray in the ED showed bilateral patchy airspace opacities in the left mid and right lower lung zones.   Continue BiPAP as needed. Supplemental oxygen  weaned to 3L today from high flow 7L. Solu-Medrol  tapered to oral Prednisone . Taper over 2 weeks per pulmonolgy. Continue home Breztri , Spiriva, DuoNebs every 6 hours scheduled, albuterol  every 4 hours as needed,  Blood cultures negative in 2 day.  Sputum grew Stenotrophomonas, continue Bactrim  for 10-14days. Encourage incentive spirometer, flutter valve, out of bed to chair.   Advised 3L with rest, 5L with exertion, sleep. Advised PCP, pulmonary clinic follow up as scheduled.   Atypical chest pain  Hx intermittent chest pain over past month. EKG without acute ischemic changes. Troponin remained negative x2. Recent CTA negative for PE outpatient.  Possible musculoskeletal related.  Continue symptomatic care.   Hyponatremia, mild  Improved. Continue to monitor. Latest 135.   Hypokalemia  Improved with supplementation. Check K level as he is on bactrim  therapy outpatient.   Hypomagnesemia.  Improved after replacement.   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 Continue folate.   HFpEF, without acute exacerbation: Euvolemic by exam,  BNP 35.  Continue home dose Lasix  daily.   Was seen by Dr. Wadie in the past.  Continue to monitor strict intake and output charting, daily weights.   CAD by imaging/ Hyperlipidemia  Continue home statin    Hypertension: BP stable, continue Amlodipine .   Mood disorder.  Continue Xanax  as needed, sertraline .   Constipation and urinary retention.   Daily scheduled constipation regimen ordered.   Debility, deconditioning.  PT/ OT recommended skilled nursing facility for STR placement.  Getting our of bed to chair. Continue rehab at facility. Advised 3L with rest, 5L with exertion, sleep.        Consultants: Pulmonology Procedures performed: none  Disposition: Skilled nursing facility Diet recommendation:  Discharge Diet Orders (From admission, onward)     Start     Ordered   07/01/23 0000  Diet - low sodium heart healthy        07/01/23 1125           Cardiac diet DISCHARGE MEDICATION: Allergies as of 07/01/2023       Reactions   Fluticasone -salmeterol Shortness Of Breath, Other (See Comments)   Worsening shortness of breath..        Medication List     STOP taking these medications    hydrOXYzine  25 MG tablet Commonly known as: ATARAX    naproxen 500 MG tablet Commonly known as: NAPROSYN   Potassium Chloride  ER 20 MEQ Tbcr       TAKE these medications    albuterol  108 (90 Base) MCG/ACT inhaler Commonly known as: VENTOLIN  HFA Inhale 2 puffs into the lungs every 6 (six) hours as needed for wheezing or shortness of breath. What changed: when to take this   ALPRAZolam  0.25 MG tablet Commonly known as: XANAX  Take 1 tablet (0.25 mg total) by mouth 3 (three) times daily as needed for anxiety. What changed:  when to take this reasons to take this   amLODipine  5 MG tablet Commonly known as: NORVASC  Take 5 mg by mouth daily.   atorvastatin  20 MG tablet Commonly known as: LIPITOR Take 20 mg by mouth daily.   Breztri  Aerosphere 160-9-4.8  MCG/ACT Aero inhaler Generic drug: budesonide -glycopyrrolate -formoterol  Inhale 2 puffs into the lungs in the morning and at bedtime.   cyanocobalamin  1000 MCG tablet Commonly known as: VITAMIN B12 Take 1,000 mcg by mouth daily.   docusate sodium  100 MG capsule Commonly known as: COLACE Take 1 capsule (100 mg total) by mouth 2 (two) times daily.   folic acid  1 MG tablet Commonly known as: FOLVITE  Take 1 mg by mouth daily.   furosemide  40 MG tablet Commonly known as: LASIX  Take 40 mg by mouth in the morning.   ipratropium-albuterol  0.5-2.5 (3) MG/3ML Soln Commonly known as: DUONEB Take 3 mLs by nebulization daily.   melatonin 3 MG Tabs tablet Take 1 tablet (3 mg total) by mouth at bedtime as needed.   methocarbamol  500 MG tablet Commonly known as: ROBAXIN  Take 1 tablet (500 mg total) by mouth every 8 (eight) hours as needed for muscle spasms.   methotrexate  50 MG/2ML injection Inject 20 mg into the skin every Sunday.   multivitamin with minerals tablet Take 1 tablet by mouth daily with breakfast.   omeprazole 20 MG tablet Commonly known as: PRILOSEC OTC Take 20 mg by mouth daily before breakfast.   OXYGEN  Inhale  4 L/min into the lungs continuous.   polyethylene glycol 17 g packet Commonly known as: MIRALAX  / GLYCOLAX  Take 17 g by mouth daily as needed for mild constipation.   predniSONE  10 MG tablet Commonly known as: DELTASONE  Take 4 tablets (40 mg total) by mouth daily with breakfast for 3 days, THEN 3 tablets (30 mg total) daily with breakfast for 3 days, THEN 2 tablets (20 mg total) daily with breakfast for 3 days, THEN 1 tablet (10 mg total) daily with breakfast for 3 days. Start taking on: July 02, 2023 What changed: See the new instructions.   senna-docusate 8.6-50 MG tablet Commonly known as: Senokot-S Take 2 tablets by mouth at bedtime.   sertraline  100 MG tablet Commonly known as: ZOLOFT  Take 100 mg by mouth daily.   sulfamethoxazole -trimethoprim   800-160 MG tablet Commonly known as: BACTRIM  DS Take 1 tablet by mouth every 12 (twelve) hours for 10 days.   TYLENOL  500 MG tablet Generic drug: acetaminophen  Take 500 mg by mouth every 8 (eight) hours as needed for mild pain (pain score 1-3) or headache.               Durable Medical Equipment  (From admission, onward)           Start     Ordered   07/01/23 0000  For home use only DME oxygen        Comments: 3L with rest, 5L with sleep, exertion. Maintain O2 88-92%.  Question Answer Comment  Length of Need Lifetime   Liters per Minute 3   Frequency Continuous (stationary and portable oxygen  unit needed)   Oxygen  conserving device Yes   Oxygen  delivery system Gas      07/01/23 1125            Contact information for after-discharge care     Destination     [facility] .   Service: Skilled Nursing Contact information: [address] [place]  [address] [phone]                    Discharge Exam: Filed Weights   06/24/23 2042 06/25/23 0134 06/26/23 0822  Weight: 91.6 kg 95.6 kg 93.4 kg      07/01/2023   10:45 AM 07/01/2023   10:30 AM 07/01/2023   10:13 AM  Vitals with BMI  Pulse 100 99 93    General - Elderly Caucasian male, mild respiratory distress while sitting. HEENT - PERRLA, EOMI, atraumatic head, non tender sinuses. Lung - decreased, basal rales, rhonchi, diffuse wheezes improved. Heart - S1, S2 heard, no murmurs, rubs, trace pedal edema. Abdomen - Soft, non tender, bowel sounds good Neuro - Alert, awake and oriented x 3, non focal exam. Skin - Warm and dry.  Condition at discharge: stable  The results of significant diagnostics from this hospitalization (including imaging, microbiology, ancillary and laboratory) are listed below for reference.   Imaging Studies: DG Chest Port 1 View Result Date: 06/24/2023 EXAM: 1 VIEW XRAY OF THE CHEST 06/24/2023 07:45:00 PM COMPARISON: CT chest dated 06/18/2023.  CLINICAL HISTORY: SOB hypoxia. SOB/hypoxia - Pt presents to ED from home via EMS for SOB for over a month. Hx of COPD pt states is worsening. Pt has increased baseline oxygen  from 3L to 5L. Pt was 80% on 5L when EMS arrived- placed on 10L NRB. FINDINGS: LUNGS AND PLEURA: Coarse interstitial markings and emphysematous changes. Superimposed patchy opacities in the left mid lung and right lower lung, suggesting multifocal pneumonia. No  pleural effusion. No pneumothorax. HEART AND MEDIASTINUM: No acute abnormality of the cardiac and mediastinal silhouettes. Thoracic aortic atherosclerosis. BONES AND SOFT TISSUES: No acute osseous abnormality. IMPRESSION: 1. Scattered patchy opacities, suspicious for multifocal pneumonia. Electronically signed by: Pinkie Pebbles MD 06/24/2023 07:50 PM EDT RP Workstation: [id number]   CT Angio Chest W/Cm &/Or Wo Cm Result Date: 06/18/2023 CLINICAL DATA:  Hypoxia shortness of breath EXAM: CT ANGIOGRAPHY CHEST WITH CONTRAST TECHNIQUE: Multidetector CT imaging of the chest was performed using the standard protocol during bolus administration of intravenous contrast. Multiplanar CT image reconstructions and MIPs were obtained to evaluate the vascular anatomy. RADIATION DOSE REDUCTION: This exam was performed according to the departmental dose-optimization program which includes automated exposure control, adjustment of the mA and/or kV according to patient size and/or use of iterative reconstruction technique. CONTRAST:  75mL OMNIPAQUE  IOHEXOL  350 MG/ML SOLN COMPARISON:  Chest x-ray 06/10/2023, CT chest 03/22/2023, 10/04/2022 FINDINGS: Cardiovascular: Mild aortic atherosclerosis. No aneurysm. Coronary vascular calcification. Normal cardiac size. No pericardial effusion Mediastinum/Nodes: Patent trachea. No thyroid  mass. No suspicious lymph nodes. Esophagus within normal limits Lungs/Pleura: Advanced emphysema. No acute airspace disease, pleural effusion or pneumothorax. Mild  bronchiectasis in the right upper lobe and probable scarring. Stable 4 mm right middle lobe subpleural nodule on series 6, image 84, no specific imaging follow-up is recommended. Upper Abdomen: No acute finding Musculoskeletal: Interval moderate compression fracture at T7, involves the inferior endplate. Review of the MIP images confirms the above findings. IMPRESSION: 1. Negative for acute pulmonary embolus or aortic dissection. 2. Advanced emphysema. No acute airspace disease. 3. Interval moderate compression fracture at T7. 4. Aortic atherosclerosis. Aortic Atherosclerosis (ICD10-I70.0) and Emphysema (ICD10-J43.9). Electronically Signed   By: Luke Bun M.D.   On: 06/18/2023 16:39   DG Chest 2 View Result Date: 06/15/2023 CLINICAL DATA:  Worsening hypoxia EXAM: CHEST - 2 VIEW COMPARISON:  03/31/2023 FINDINGS: Frontal and lateral views of the chest demonstrate a stable cardiac silhouette. Emphysema and parenchymal lung scarring are again noted. There is persistent pulmonary vascular congestion, with decreased interstitial and ground-glass opacity seen previously. No effusion or pneumothorax. No acute bony abnormalities. IMPRESSION: 1. Central vascular congestion, with decreased interstitial and ground-glass opacities consistent with improved volume status. 2. Background emphysema. Electronically Signed   By: Ozell Daring M.D.   On: 06/15/2023 20:26    Microbiology: Results for orders placed or performed during the hospital encounter of 06/24/23  Culture, blood (Routine X 2) w Reflex to ID Panel     Status: None   Collection Time: 06/24/23  7:43 PM   Specimen: BLOOD  Result Value Ref Range Status   Specimen Description   Final    BLOOD RIGHT ANTECUBITAL Performed at [facility], [address] [place], [place] [address]    Special Requests   Final    BOTTLES DRAWN AEROBIC AND ANAEROBIC Blood Culture adequate volume Performed at [facility], [address] [place], [place] [address]    Culture   Final    NO GROWTH 5 DAYS Performed at [facility], [address] [place], [place] [address]    Report Status 06/29/2023 FINAL  Final  Resp panel by RT-PCR (RSV, Flu A&B, Covid) Anterior Nasal Swab     Status: None   Collection Time: 06/24/23  8:02 PM   Specimen: Anterior Nasal Swab  Result Value Ref Range Status   SARS Coronavirus 2 by RT PCR NEGATIVE NEGATIVE Final    Comment: (NOTE) SARS-CoV-2  target nucleic acids are NOT DETECTED.  The SARS-CoV-2 RNA is generally detectable in upper respiratory specimens during the acute phase of infection. The lowest concentration of SARS-CoV-2 viral copies this assay can detect is 138 copies/mL. A negative result does not preclude SARS-Cov-2 infection and should not be used as the sole basis for treatment or other patient management decisions. A negative result may occur with  improper specimen collection/handling, submission of specimen other than nasopharyngeal swab, presence of viral mutation(s) within the areas targeted by this assay, and inadequate number of viral copies(<138 copies/mL). A negative result must be combined with clinical observations, patient history, and epidemiological information. The expected result is Negative.  Fact Sheet for Patients:  BloggerCourse.com  Fact Sheet for Healthcare Providers:  SeriousBroker.it  This test is no t yet approved or cleared by the [place]  FDA and  has been authorized for detection and/or diagnosis of SARS-CoV-2 by FDA under an Emergency Use Authorization (EUA). This EUA will remain  in effect (meaning this test can be used) for the duration of the COVID-19 declaration under Section 564(b)(1) of the Act, 21 U.S.C.section 360bbb-3(b)(1), unless the authorization is terminated  or revoked sooner.       Influenza A by PCR NEGATIVE NEGATIVE Final   Influenza B by PCR  NEGATIVE NEGATIVE Final    Comment: (NOTE) The Xpert Xpress SARS-CoV-2/FLU/RSV plus assay is intended as an aid in the diagnosis of influenza from Nasopharyngeal swab specimens and should not be used as a sole basis for treatment. Nasal washings and aspirates are unacceptable for Xpert Xpress SARS-CoV-2/FLU/RSV testing.  Fact Sheet for Patients: BloggerCourse.com  Fact Sheet for Healthcare Providers: SeriousBroker.it  This test is not yet approved or cleared by the [place]  FDA and has been authorized for detection and/or diagnosis of SARS-CoV-2 by FDA under an Emergency Use Authorization (EUA). This EUA will remain in effect (meaning this test can be used) for the duration of the COVID-19 declaration under Section 564(b)(1) of the Act, 21 U.S.C. section 360bbb-3(b)(1), unless the authorization is terminated or revoked.     Resp Syncytial Virus by PCR NEGATIVE NEGATIVE Final    Comment: (NOTE) Fact Sheet for Patients: BloggerCourse.com  Fact Sheet for Healthcare Providers: SeriousBroker.it  This test is not yet approved or cleared by the [place]  FDA and has been authorized for detection and/or diagnosis of SARS-CoV-2 by FDA under an Emergency Use Authorization (EUA). This EUA will remain in effect (meaning this test can be used) for the duration of the COVID-19 declaration under Section 564(b)(1) of the Act, 21 U.S.C. section 360bbb-3(b)(1), unless the authorization is terminated or revoked.  Performed at [facility], [address] [place], [place] [address]   Respiratory (~20 pathogens) panel by PCR     Status: None   Collection Time: 06/24/23  8:02 PM   Specimen: Nasopharyngeal Swab; Respiratory  Result Value Ref Range Status   Adenovirus NOT DETECTED NOT DETECTED Final   Coronavirus 229E NOT DETECTED NOT DETECTED Final    Comment:  (NOTE) The Coronavirus on the Respiratory Panel, DOES NOT test for the novel  Coronavirus (2019 nCoV)    Coronavirus HKU1 NOT DETECTED NOT DETECTED Final   Coronavirus NL63 NOT DETECTED NOT DETECTED Final   Coronavirus OC43 NOT DETECTED NOT DETECTED Final   Metapneumovirus NOT DETECTED NOT DETECTED Final   Rhinovirus / Enterovirus NOT DETECTED NOT DETECTED Final   Influenza A NOT DETECTED NOT DETECTED Final   Influenza B NOT DETECTED NOT DETECTED  Final   Parainfluenza Virus 1 NOT DETECTED NOT DETECTED Final   Parainfluenza Virus 2 NOT DETECTED NOT DETECTED Final   Parainfluenza Virus 3 NOT DETECTED NOT DETECTED Final   Parainfluenza Virus 4 NOT DETECTED NOT DETECTED Final   Respiratory Syncytial Virus NOT DETECTED NOT DETECTED Final   Bordetella pertussis NOT DETECTED NOT DETECTED Final   Bordetella Parapertussis NOT DETECTED NOT DETECTED Final   Chlamydophila pneumoniae NOT DETECTED NOT DETECTED Final   Mycoplasma pneumoniae NOT DETECTED NOT DETECTED Final    Comment: Performed at [facility], [address], [place], [place] [address]  MRSA Next Gen by PCR, Nasal     Status: None   Collection Time: 06/24/23 11:37 PM   Specimen: Nasal Mucosa; Nasal Swab  Result Value Ref Range Status   MRSA by PCR Next Gen NOT DETECTED NOT DETECTED Final    Comment: (NOTE) The GeneXpert MRSA Assay (FDA approved for NASAL specimens only), is one component of a comprehensive MRSA colonization surveillance program. It is not intended to diagnose MRSA infection nor to guide or monitor treatment for MRSA infections. Test performance is not FDA approved in patients less than 24 years old. Performed at [facility], [address], [place], [place] [address]   Culture, blood (Routine X 2) w Reflex to ID Panel     Status: None   Collection Time: 06/25/23  3:12 AM   Specimen: BLOOD  Result Value Ref Range Status   Specimen Description   Final    BLOOD BLOOD RIGHT  HAND Performed at [facility], [address], [place], [place] [address]    Special Requests   Final    BOTTLES DRAWN AEROBIC AND ANAEROBIC Blood Culture results may not be optimal due to an inadequate volume of blood received in culture bottles Performed at [facility], [address] [place], [place] [address]    Culture   Final    NO GROWTH 5 DAYS Performed at [facility], [address] [place], [place] [address]    Report Status 06/30/2023 FINAL  Final  Expectorated Sputum Assessment w Gram Stain, Rflx to Resp Cult     Status: None   Collection Time: 06/26/23 12:10 PM   Specimen: Expectorated Sputum  Result Value Ref Range Status   Specimen Description EXPECTORATED SPUTUM  Final   Special Requests NONE  Final   Sputum evaluation   Final    THIS SPECIMEN IS ACCEPTABLE FOR SPUTUM CULTURE Performed at [facility], [address], [place], [place] [address]    Report Status 06/26/2023 FINAL  Final  Culture, Respiratory w Gram Stain     Status: None   Collection Time: 06/26/23 12:10 PM  Result Value Ref Range Status   Specimen Description   Final    EXPECTORATED SPUTUM Performed at [facility], [address], [place], [place] [address]    Special Requests   Final    NONE Reflexed from [id number] Performed at [facility], [address], [place], [place] [address]    Gram Stain   Final    NO WBC SEEN ABUNDANT GRAM NEGATIVE RODS Performed at [facility], [address], [place], [place] [address]    Culture ABUNDANT STENOTROPHOMONAS MALTOPHILIA  Final   Report Status 06/29/2023 FINAL  Final   Organism ID, Bacteria STENOTROPHOMONAS MALTOPHILIA  Final      Susceptibility   Stenotrophomonas maltophilia - MIC*  LEVOFLOXACIN 0.25 SENSITIVE Sensitive     TRIMETH /SULFA  <=20 SENSITIVE Sensitive     * ABUNDANT STENOTROPHOMONAS MALTOPHILIA    Labs: CBC: Recent  Labs  Lab 06/24/23 1930 06/24/23 1943 06/25/23 0312 06/26/23 0307 06/27/23 0312 06/28/23 0313  WBC 8.2  --  5.7 5.6 7.8 10.4  NEUTROABS 6.4  --   --   --   --   --   HGB 13.1 14.3 12.6* 12.5* 12.2* 13.2  HCT 39.8 42.0 38.8* 38.4* 37.9* 41.3  MCV 106.4*  --  109.3* 109.1* 108.9* 110.1*  PLT 141*  --  119* 132* 150 148*   Basic Metabolic Panel: Recent Labs  Lab 06/25/23 0312 06/26/23 0307 06/27/23 0312 06/28/23 0313 07/01/23 0314  NA 134* 133* 135 135 136  K 4.3 4.0 4.3 4.9 4.2  CL 96* 93* 94* 93* 94*  CO2 29 31 31 31  32  GLUCOSE 168* 188* 179* 189* 103*  BUN 12 15 15 15 20   CREATININE 0.58* 0.63 0.74 0.73 0.76  CALCIUM  8.3* 8.7* 8.9 9.1 8.9  MG 1.5* 2.1 2.2 2.0  --   PHOS 3.0  --   --   --   --    Liver Function Tests: Recent Labs  Lab 06/24/23 1930  AST 22  ALT 16  ALKPHOS 107  BILITOT 0.7  PROT 6.7  ALBUMIN 3.1*   CBG: No results for input(s): GLUCAP in the last 168 hours.  Discharge time spent: 40 minutes.  Signed: Concepcion Riser, MD [facility] 07/01/2023

## 2023-07-01 NOTE — Progress Notes (Signed)
 Physical Therapy Treatment Patient Details Name: [name] [name] MRN: [id number] DOB: Nov 06, 1945 Today's Date: 07/01/2023   History of Present Illness [name] [name] is a 78 y.o. male presented 06/24/23 with progressive SOB requiring NRB mask. Found to have multifocal pneumonia, COPD exacerbation, hyponatremia. EFY:dzczmz COPD, chronic hypoxic respiratory failure on 4 L of oxygen  at baseline, rheumatoid arthritis, heart failure with preserved ejection fraction, CAD, hypertension, hyperlipidemia, mood disorder    PT Comments  The patient is eager to get up to recliner. The patient is noted to desaturate down into 74% on 5 LPM. RN aware and increased to  6 L. Patient required several minutes to return to 88%. Noted 4/4 dyspnea.  Continue PT for mobility. Patient will benefit from continued inpatient follow up therapy, <3 hours/day       If plan is discharge home, recommend the following: A little help with walking and/or transfers;Assistance with cooking/housework;A little help with bathing/dressing/bathroom;Help with stairs or ramp for entrance;Assist for transportation   Can travel by private vehicle        Equipment Recommendations  None recommended by PT    Recommendations for Other Services       Precautions / Restrictions Precautions Precautions: Fall Precaution/Restrictions Comments: monitor sats, very SOB Restrictions Weight Bearing Restrictions Per Provider Order: No     Mobility  Bed Mobility Overal bed mobility: Needs Assistance Bed Mobility: Supine to Sit     Supine to sit: Contact guard     General bed mobility comments: for safety    Transfers Overall transfer level: Needs assistance Equipment used: Rolling walker (2 wheels) Transfers: Sit to/from Stand Sit to Stand: Contact guard assist   Step pivot transfers: Contact guard assist       General transfer comment: cues  to hold onto the RW to pivot    Ambulation/Gait                General Gait Details: desat to 70's, unable   Stairs             Wheelchair Mobility     Tilt Bed    Modified Rankin (Stroke Patients Only)       Balance Overall balance assessment: Needs assistance Sitting-balance support: No upper extremity supported, Feet supported Sitting balance-Leahy Scale: Good     Standing balance support: During functional activity, Bilateral upper extremity supported, Reliant on assistive device for balance Standing balance-Leahy Scale: Fair                              Hotel manager: No apparent difficulties  Cognition Arousal: Alert Behavior During Therapy: WFL for tasks assessed/performed                             Following commands: Intact      Cueing    Exercises      General Comments        Pertinent Vitals/Pain Pain Assessment Pain Assessment: No/denies pain    Home Living                          Prior Function            PT Goals (current goals can now be found in the care plan section) Progress towards PT goals: Progressing toward goals    Frequency    Min 3X/week  PT Plan      Co-evaluation              AM-PAC PT 6 Clicks Mobility   Outcome Measure  Help needed turning from your back to your side while in a flat bed without using bedrails?: None Help needed moving from lying on your back to sitting on the side of a flat bed without using bedrails?: None Help needed moving to and from a bed to a chair (including a wheelchair)?: A Little Help needed standing up from a chair using your arms (e.g., wheelchair or bedside chair)?: A Little Help needed to walk in hospital room?: Total Help needed climbing 3-5 steps with a railing? : Total 6 Click Score: 16    End of Session Equipment Utilized During Treatment: Oxygen  Activity Tolerance: Patient limited by fatigue;Treatment limited secondary to medical complications  (Comment) Patient left: in chair;with call bell/phone within reach;with chair alarm set Nurse Communication: Mobility status PT Visit Diagnosis: Unsteadiness on feet (R26.81);Muscle weakness (generalized) (M62.81)     Time: 9171-9160 PT Time Calculation (min) (ACUTE ONLY): 11 min  Charges:    $Therapeutic Activity: 8-22 mins PT General Charges $$ ACUTE PT VISIT: 1 Visit                     Darice Potters PT Acute Rehabilitation Services Office [phone]    Potters Darice Norris 07/01/2023, 12:31 PM

## 2023-07-01 NOTE — TOC Transition Note (Signed)
 Transition of Care [facility]) - Discharge Note   Patient Details  Name: [name] [name] MRN: [id number] Date of Birth: 1945/08/03  [facility] CM/SW Contact:  Jon ONEIDA Anon, RN Phone Number: 07/01/2023, 11:51 AM   Clinical Narrative:    Pt will discharge to [facility] SNF for STR. Pt and pt daughter notified of discharge plan and are in agreement. DC packet with scripts placed at RN station. RN given report to call at [phone] to rm [phone]. PT[facility] called at 1211 for transport to [facility]. There are no other TOC needs at this time.    Final next level of care: Skilled Nursing Facility Barriers to Discharge: No Barriers Identified   Patient Goals and CMS Choice Patient states their goals for this hospitalization and ongoing recovery are:: Go to SNF to get stronger and then return home CMS Medicare.gov Compare Post Acute Care list provided to:: Patient Choice offered to / list presented to : Patient [place] ownership interest in [facility].provided to:: Patient    Discharge Placement              Patient chooses bed at: [facility] Patient to be transferred to facility by: PTAR Name of family member notified: Ossie, Beltran (Daughter)  [phone] Patient and family notified of of transfer: 07/01/23  Discharge Plan and Services Additional resources added to the After Visit Summary for     Discharge Planning Services: CM Consult Post Acute Care Choice: Skilled Nursing Facility          DME Arranged: N/A DME Agency: NA       HH Arranged: NA HH Agency: NA        Social Drivers of Health (SDOH) Interventions SDOH Screenings   Food Insecurity: No Food Insecurity (06/25/2023)  Housing: Low Risk  (06/25/2023)  Transportation Needs: No Transportation Needs (06/25/2023)  Utilities: Not At Risk (06/25/2023)  Depression (PHQ2-9): Low Risk  (03/09/2019)  Social Connections: Socially Isolated (06/25/2023)  Tobacco Use: Medium Risk (06/24/2023)      Readmission Risk Interventions    06/26/2023    2:14 PM 03/31/2023    3:55 PM 07/08/2022   10:55 AM  Readmission Risk Prevention Plan  Post Dischage Appt   Complete  Medication Screening   Complete  Transportation Screening Complete Complete Complete  PCP or Specialist Appt within 3-5 Days  Complete   HRI or Home Care Consult  Complete   Social Work Consult for Recovery Care Planning/Counseling  Complete   Palliative Care Screening  Not Applicable   Medication Review Oceanographer) Complete Complete   PCP or Specialist appointment within 3-5 days of discharge Complete    HRI or Home Care Consult Complete    SW Recovery Care/Counseling Consult Complete    Palliative Care Screening Not Applicable    Skilled Nursing Facility Complete

## 2023-07-01 NOTE — Plan of Care (Signed)

## 2023-07-02 ENCOUNTER — Ambulatory Visit (HOSPITAL_BASED_OUTPATIENT_CLINIC_OR_DEPARTMENT_OTHER): Admitting: Primary Care

## 2023-07-03 ENCOUNTER — Telehealth (HOSPITAL_BASED_OUTPATIENT_CLINIC_OR_DEPARTMENT_OTHER): Payer: Self-pay

## 2023-07-03 NOTE — Telephone Encounter (Signed)
 Copied from CRM [id number]. Topic: General - Other >> Jul 03, 2023 11:54 AM [name] [name] wrote: Reason for CRM: Patient would like to inform Dr. Jude that he is still at [facility] and rehab and that he is not getting better yet.

## 2023-07-04 DIAGNOSIS — J441 Chronic obstructive pulmonary disease with (acute) exacerbation: Secondary | ICD-10-CM | POA: Diagnosis not present

## 2023-07-04 DIAGNOSIS — I5033 Acute on chronic diastolic (congestive) heart failure: Secondary | ICD-10-CM | POA: Diagnosis not present

## 2023-07-04 DIAGNOSIS — E871 Hypo-osmolality and hyponatremia: Secondary | ICD-10-CM | POA: Diagnosis not present

## 2023-07-04 DIAGNOSIS — J189 Pneumonia, unspecified organism: Secondary | ICD-10-CM | POA: Diagnosis not present

## 2023-07-05 ENCOUNTER — Emergency Department (HOSPITAL_COMMUNITY)

## 2023-07-05 ENCOUNTER — Other Ambulatory Visit: Payer: Self-pay

## 2023-07-05 ENCOUNTER — Inpatient Hospital Stay (HOSPITAL_COMMUNITY)
Admission: EM | Admit: 2023-07-05 | Discharge: 2023-07-15 | DRG: 189 | Disposition: A | Discharge status: Skilled Nursing Facility | Attending: Internal Medicine | Admitting: Internal Medicine

## 2023-07-05 DIAGNOSIS — Z9981 Dependence on supplemental oxygen: Secondary | ICD-10-CM | POA: Diagnosis not present

## 2023-07-05 DIAGNOSIS — Z8701 Personal history of pneumonia (recurrent): Secondary | ICD-10-CM

## 2023-07-05 DIAGNOSIS — I2510 Atherosclerotic heart disease of native coronary artery without angina pectoris: Secondary | ICD-10-CM | POA: Diagnosis present

## 2023-07-05 DIAGNOSIS — K5909 Other constipation: Secondary | ICD-10-CM | POA: Diagnosis present

## 2023-07-05 DIAGNOSIS — R000 Tachycardia, unspecified: Secondary | ICD-10-CM | POA: Diagnosis not present

## 2023-07-05 DIAGNOSIS — I5032 Chronic diastolic (congestive) heart failure: Secondary | ICD-10-CM | POA: Diagnosis not present

## 2023-07-05 DIAGNOSIS — K219 Gastro-esophageal reflux disease without esophagitis: Secondary | ICD-10-CM | POA: Diagnosis not present

## 2023-07-05 DIAGNOSIS — E7800 Pure hypercholesterolemia, unspecified: Secondary | ICD-10-CM | POA: Diagnosis present

## 2023-07-05 DIAGNOSIS — R739 Hyperglycemia, unspecified: Secondary | ICD-10-CM | POA: Diagnosis not present

## 2023-07-05 DIAGNOSIS — R531 Weakness: Secondary | ICD-10-CM | POA: Diagnosis present

## 2023-07-05 DIAGNOSIS — Z87891 Personal history of nicotine dependence: Secondary | ICD-10-CM | POA: Diagnosis not present

## 2023-07-05 DIAGNOSIS — J9621 Acute and chronic respiratory failure with hypoxia: Secondary | ICD-10-CM | POA: Diagnosis not present

## 2023-07-05 DIAGNOSIS — J439 Emphysema, unspecified: Secondary | ICD-10-CM | POA: Diagnosis not present

## 2023-07-05 DIAGNOSIS — J9611 Chronic respiratory failure with hypoxia: Secondary | ICD-10-CM | POA: Diagnosis present

## 2023-07-05 DIAGNOSIS — K5900 Constipation, unspecified: Secondary | ICD-10-CM | POA: Diagnosis present

## 2023-07-05 DIAGNOSIS — E782 Mixed hyperlipidemia: Secondary | ICD-10-CM | POA: Diagnosis not present

## 2023-07-05 DIAGNOSIS — F411 Generalized anxiety disorder: Secondary | ICD-10-CM | POA: Diagnosis present

## 2023-07-05 DIAGNOSIS — I10 Essential (primary) hypertension: Secondary | ICD-10-CM | POA: Diagnosis present

## 2023-07-05 DIAGNOSIS — Z79899 Other long term (current) drug therapy: Secondary | ICD-10-CM | POA: Diagnosis not present

## 2023-07-05 DIAGNOSIS — J449 Chronic obstructive pulmonary disease, unspecified: Secondary | ICD-10-CM | POA: Diagnosis not present

## 2023-07-05 DIAGNOSIS — Z7401 Bed confinement status: Secondary | ICD-10-CM | POA: Diagnosis not present

## 2023-07-05 DIAGNOSIS — J441 Chronic obstructive pulmonary disease with (acute) exacerbation: Principal | ICD-10-CM | POA: Diagnosis present

## 2023-07-05 DIAGNOSIS — R0689 Other abnormalities of breathing: Secondary | ICD-10-CM | POA: Diagnosis not present

## 2023-07-05 DIAGNOSIS — M069 Rheumatoid arthritis, unspecified: Secondary | ICD-10-CM | POA: Diagnosis present

## 2023-07-05 DIAGNOSIS — R41841 Cognitive communication deficit: Secondary | ICD-10-CM | POA: Diagnosis not present

## 2023-07-05 DIAGNOSIS — R0602 Shortness of breath: Secondary | ICD-10-CM | POA: Diagnosis not present

## 2023-07-05 DIAGNOSIS — I110 Hypertensive heart disease with heart failure: Secondary | ICD-10-CM | POA: Diagnosis present

## 2023-07-05 DIAGNOSIS — E785 Hyperlipidemia, unspecified: Secondary | ICD-10-CM | POA: Diagnosis present

## 2023-07-05 DIAGNOSIS — R278 Other lack of coordination: Secondary | ICD-10-CM | POA: Diagnosis not present

## 2023-07-05 DIAGNOSIS — J189 Pneumonia, unspecified organism: Secondary | ICD-10-CM | POA: Diagnosis not present

## 2023-07-05 DIAGNOSIS — R918 Other nonspecific abnormal finding of lung field: Secondary | ICD-10-CM | POA: Diagnosis not present

## 2023-07-05 DIAGNOSIS — R2681 Unsteadiness on feet: Secondary | ICD-10-CM | POA: Diagnosis not present

## 2023-07-05 DIAGNOSIS — I959 Hypotension, unspecified: Secondary | ICD-10-CM | POA: Diagnosis not present

## 2023-07-05 DIAGNOSIS — M6281 Muscle weakness (generalized): Secondary | ICD-10-CM | POA: Diagnosis not present

## 2023-07-05 DIAGNOSIS — T380X5A Adverse effect of glucocorticoids and synthetic analogues, initial encounter: Secondary | ICD-10-CM | POA: Diagnosis present

## 2023-07-05 LAB — COMPREHENSIVE METABOLIC PANEL WITH GFR
ALT: 25 U/L (ref 0–44)
AST: 25 U/L (ref 15–41)
Albumin: 3.2 g/dL — ABNORMAL LOW (ref 3.5–5.0)
Alkaline Phosphatase: 101 U/L (ref 38–126)
Anion gap: 10 (ref 5–15)
BUN: 14 mg/dL (ref 8–23)
CO2: 26 mmol/L (ref 22–32)
Calcium: 8.2 mg/dL — ABNORMAL LOW (ref 8.9–10.3)
Chloride: 99 mmol/L (ref 98–111)
Creatinine, Ser: 0.66 mg/dL (ref 0.61–1.24)
GFR, Estimated: >60 mL/min (ref >60)
Glucose, Bld: 196 mg/dL — ABNORMAL HIGH (ref 70–99)
Potassium: 4.4 mmol/L (ref 3.5–5.1)
Sodium: 135 mmol/L (ref 135–145)
Total Bilirubin: 0.8 mg/dL (ref 0.0–1.2)
Total Protein: 6.3 g/dL — ABNORMAL LOW (ref 6.5–8.1)

## 2023-07-05 LAB — CBC WITH DIFFERENTIAL/PLATELET
Abs Immature Granulocytes: 0.15 10*3/uL — ABNORMAL HIGH (ref 0.00–0.07)
Basophils Absolute: 0 10*3/uL (ref 0.0–0.1)
Basophils Relative: 0 %
Eosinophils Absolute: 0 10*3/uL (ref 0.0–0.5)
Eosinophils Relative: 0 %
HCT: 43 % (ref 39.0–52.0)
Hemoglobin: 14 g/dL (ref 13.0–17.0)
Immature Granulocytes: 2 %
Lymphocytes Relative: 5 %
Lymphs Abs: 0.4 10*3/uL — ABNORMAL LOW (ref 0.7–4.0)
MCH: 34.8 pg — ABNORMAL HIGH (ref 26.0–34.0)
MCHC: 32.6 g/dL (ref 30.0–36.0)
MCV: 107 fL — ABNORMAL HIGH (ref 80.0–100.0)
Monocytes Absolute: 0.2 10*3/uL (ref 0.1–1.0)
Monocytes Relative: 2 %
Neutro Abs: 7.4 10*3/uL (ref 1.7–7.7)
Neutrophils Relative %: 91 %
Platelets: 152 10*3/uL (ref 150–400)
RBC: 4.02 MIL/uL — ABNORMAL LOW (ref 4.22–5.81)
RDW: 15.5 % (ref 11.5–15.5)
WBC: 8.1 10*3/uL (ref 4.0–10.5)
nRBC: 0 % (ref 0.0–0.2)

## 2023-07-05 LAB — GLUCOSE, CAPILLARY: Glucose-Capillary: 261 mg/dL — ABNORMAL HIGH (ref 70–99)

## 2023-07-05 LAB — BLOOD GAS, VENOUS
Acid-Base Excess: 5.5 mmol/L — ABNORMAL HIGH (ref 0.0–2.0)
Bicarbonate: 31.1 mmol/L — ABNORMAL HIGH (ref 20.0–28.0)
O2 Saturation: 97.6 %
Patient temperature: 37
pCO2, Ven: 48 mmHg (ref 44–60)
pH, Ven: 7.42 (ref 7.25–7.43)
pO2, Ven: 78 mmHg — ABNORMAL HIGH (ref 32–45)

## 2023-07-05 MED ORDER — ONDANSETRON HCL 4 MG/2ML IJ SOLN: 4.0000 mg | Freq: Four times a day (QID) | INTRAMUSCULAR | Status: DC | PRN

## 2023-07-05 MED ORDER — AMLODIPINE BESYLATE 5 MG PO TABS
5.0000 mg | ORAL_TABLET | Freq: Every day | ORAL | Status: DC
  Administered 2023-07-06 – 2023-07-15 (×9): 5 mg via ORAL
  Filled 2023-07-05 (×10): qty 1

## 2023-07-05 MED ORDER — INSULIN ASPART 100 UNIT/ML IJ SOLN
0.0000 [IU] | Freq: Three times a day (TID) | INTRAMUSCULAR | Status: DC
  Administered 2023-07-06 – 2023-07-07 (×2): 2 [IU] via SUBCUTANEOUS
  Administered 2023-07-08: 3 [IU] via SUBCUTANEOUS
  Administered 2023-07-12: 5 [IU] via SUBCUTANEOUS
  Administered 2023-07-13 – 2023-07-14 (×2): 3 [IU] via SUBCUTANEOUS

## 2023-07-05 MED ORDER — ONDANSETRON HCL 4 MG PO TABS: 4.0000 mg | ORAL_TABLET | Freq: Four times a day (QID) | ORAL | Status: DC | PRN

## 2023-07-05 MED ORDER — ALPRAZOLAM 0.25 MG PO TABS
0.2500 mg | ORAL_TABLET | Freq: Three times a day (TID) | ORAL | Status: DC | PRN
  Administered 2023-07-05 – 2023-07-15 (×24): 0.25 mg via ORAL
  Filled 2023-07-05 (×26): qty 1

## 2023-07-05 MED ORDER — ATORVASTATIN CALCIUM 10 MG PO TABS
20.0000 mg | ORAL_TABLET | Freq: Every day | ORAL | Status: DC
  Administered 2023-07-06 – 2023-07-15 (×10): 20 mg via ORAL
  Filled 2023-07-05 (×10): qty 2

## 2023-07-05 MED ORDER — ENOXAPARIN SODIUM 40 MG/0.4ML IJ SOSY
40.0000 mg | PREFILLED_SYRINGE | INTRAMUSCULAR | Status: DC
  Administered 2023-07-05 – 2023-07-14 (×10): 40 mg via SUBCUTANEOUS
  Filled 2023-07-05 (×10): qty 0.4

## 2023-07-05 MED ORDER — DOCUSATE SODIUM 100 MG PO CAPS
100.0000 mg | ORAL_CAPSULE | Freq: Two times a day (BID) | ORAL | Status: DC
  Administered 2023-07-05 – 2023-07-14 (×16): 100 mg via ORAL
  Filled 2023-07-05 (×19): qty 1

## 2023-07-05 MED ORDER — SENNOSIDES-DOCUSATE SODIUM 8.6-50 MG PO TABS
2.0000 | ORAL_TABLET | Freq: Every day | ORAL | Status: DC
  Administered 2023-07-05 – 2023-07-14 (×8): 2 via ORAL
  Filled 2023-07-05 (×9): qty 2

## 2023-07-05 MED ORDER — SENNOSIDES-DOCUSATE SODIUM 8.6-50 MG PO TABS: 1.0000 | ORAL_TABLET | Freq: Every evening | ORAL | Status: DC | PRN

## 2023-07-05 MED ORDER — SULFAMETHOXAZOLE-TRIMETHOPRIM 800-160 MG PO TABS
1.0000 | ORAL_TABLET | Freq: Two times a day (BID) | ORAL | Status: AC
  Administered 2023-07-05 – 2023-07-11 (×13): 1 via ORAL
  Filled 2023-07-05 (×13): qty 1

## 2023-07-05 MED ORDER — ACETAMINOPHEN 325 MG PO TABS
650.0000 mg | ORAL_TABLET | Freq: Four times a day (QID) | ORAL | Status: DC | PRN
  Administered 2023-07-06 – 2023-07-15 (×11): 650 mg via ORAL
  Filled 2023-07-05 (×11): qty 2

## 2023-07-05 MED ORDER — IPRATROPIUM-ALBUTEROL 0.5-2.5 (3) MG/3ML IN SOLN
3.0000 mL | Freq: Four times a day (QID) | RESPIRATORY_TRACT | Status: DC
  Administered 2023-07-06 – 2023-07-12 (×24): 3 mL via RESPIRATORY_TRACT
  Filled 2023-07-05 (×23): qty 3

## 2023-07-05 MED ORDER — FUROSEMIDE 40 MG PO TABS
40.0000 mg | ORAL_TABLET | Freq: Every day | ORAL | Status: DC
  Administered 2023-07-06 – 2023-07-15 (×10): 40 mg via ORAL
  Filled 2023-07-05 (×7): qty 1
  Filled 2023-07-05: qty 2
  Filled 2023-07-05 (×2): qty 1

## 2023-07-05 MED ORDER — BUDESON-GLYCOPYRROL-FORMOTEROL 160-9-4.8 MCG/ACT IN AERO
2.0000 | INHALATION_SPRAY | Freq: Two times a day (BID) | RESPIRATORY_TRACT | Status: DC
  Administered 2023-07-05 – 2023-07-15 (×20): 2 via RESPIRATORY_TRACT
  Filled 2023-07-05 (×2): qty 5.9

## 2023-07-05 MED ORDER — METHYLPREDNISOLONE SODIUM SUCC 40 MG IJ SOLR
40.0000 mg | Freq: Two times a day (BID) | INTRAMUSCULAR | Status: DC
  Administered 2023-07-05 – 2023-07-10 (×11): 40 mg via INTRAVENOUS
  Filled 2023-07-05 (×11): qty 1

## 2023-07-05 MED ORDER — ALBUTEROL SULFATE (2.5 MG/3ML) 0.083% IN NEBU
15.0000 mg/h | INHALATION_SOLUTION | Freq: Once | RESPIRATORY_TRACT | Status: AC
  Administered 2023-07-05: 15 mg/h via RESPIRATORY_TRACT
  Filled 2023-07-05: qty 3

## 2023-07-05 MED ORDER — ALBUTEROL SULFATE (2.5 MG/3ML) 0.083% IN NEBU
INHALATION_SOLUTION | RESPIRATORY_TRACT | Status: AC
  Filled 2023-07-05: qty 15

## 2023-07-05 MED ORDER — SODIUM CHLORIDE 0.9 % IV SOLN
500.0000 mg | INTRAVENOUS | Status: AC
  Administered 2023-07-05 – 2023-07-08 (×4): 500 mg via INTRAVENOUS
  Filled 2023-07-05 (×4): qty 5

## 2023-07-05 MED ORDER — MELATONIN 3 MG PO TABS
3.0000 mg | ORAL_TABLET | Freq: Every evening | ORAL | Status: DC | PRN
  Administered 2023-07-07 – 2023-07-14 (×8): 3 mg via ORAL
  Filled 2023-07-05 (×9): qty 1

## 2023-07-05 MED ORDER — ACETAMINOPHEN 650 MG RE SUPP: 650.0000 mg | Freq: Four times a day (QID) | RECTAL | Status: DC | PRN

## 2023-07-05 MED ORDER — MAGNESIUM SULFATE 2 GM/50ML IV SOLN
2.0000 g | Freq: Once | INTRAVENOUS | Status: AC
  Administered 2023-07-05: 2 g via INTRAVENOUS
  Filled 2023-07-05: qty 50

## 2023-07-05 NOTE — H&P (Signed)
 History and Physical  Severin Bou Bitterman FMW:[id number] DOB: [birth year] DOA: 07/05/2023  PCP: Regino Slater, MD   Chief Complaint: Shortness of breath  HPI: JAQUON GINGERICH is a 78 y.o. 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 Patient was recently hospitalized for multifocal pneumonia and COPD exacerbation from 6/16 to 6/23 and discharged to rehab facility. Patient states he has had gradual worsening of his shortness of breath since discharge from the hospital.  His oxygen  levels do drop to the 70s while working with physical therapy.  He has significant been hypoxic to the mid to high 80s with minimal exertion such as walking from his bedroom to the bathroom.  He endorsed a dry cough, chest tightness and some wheezing but denies any fevers, chills, abdominal pain, nausea, vomiting or dizziness.  On EMS arrival, patient was found to be very labored with SpO2 in the low 90s, down to 70s with exertion.  Patient was given 10 albuterol , 0.5 Atrovent, and 125 mg IV Solu-Medrol   ED Course: Initial vitals show temp 98.2, RR 38, HR 107, BP 165/68, SpO2 95% initially on BiPAP at 40% FiO2, transitioned to high flow [place] on 7 L [place] with SpO2 of 91%. Initial labs significant for WBC 8.1, normal kidney function, VBG shows pH 7.42, PCO248, PO278 and bicarb 31. EKG shows sinus tach. CXR shows persistent diffuse interstitial coarsening with unchanged asymmetric opacity in the left lung. Pt received IV mag 2 g x 1 and albuterol  neb.  Patient was placed on BiPAP briefly due to tachypnea and increased work of breathing.  He was transitioned to high flow nasal cannula and TRH was consulted for admission.   Review of Systems: Please see HPI for pertinent positives and negatives. A complete 10 system review of systems are otherwise  negative.  Past Medical History:  Diagnosis Date   COPD (chronic obstructive pulmonary disease) (HCC)    High cholesterol    Hypertension    Past Surgical History:  Procedure Laterality Date   NASAL SINUS SURGERY  2007   Social History:  reports that he quit smoking about 15 years ago. His smoking use included cigarettes. He started smoking about 55 years ago. He has a 80 pack-year smoking history. He has never been exposed to tobacco smoke. He has never used smokeless tobacco. He reports current alcohol use of about 12.0 standard drinks of alcohol per week. He reports that he does not use drugs.  Allergies  Allergen Reactions   Fluticasone -Salmeterol Shortness Of Breath and Other (See Comments)    Worsening shortness of breath..    Family History  Problem Relation Age of Onset   Esophageal cancer Father      Prior to Admission medications   Medication Sig Start Date End Date Taking? Authorizing Provider  albuterol  (VENTOLIN  HFA) 108 (90 Base) MCG/ACT inhaler Inhale 2 puffs into the lungs every 6 (six) hours as needed for wheezing or shortness of breath. Patient taking differently: Inhale 2 puffs into the lungs every 4 (four) hours as needed for shortness of breath or wheezing. 12/08/21   Jude Harden GAILS, MD  ALPRAZolam  (XANAX ) 0.25 MG tablet Take 1 tablet (0.25 mg total) by mouth 3 (three) times daily as needed for anxiety. 07/01/23   Darci Pore, MD  amLODipine  (NORVASC ) 5 MG tablet Take 5 mg by mouth daily. 11/23/22  [provider]  atorvastatin  (LIPITOR) 20 MG tablet Take 20 mg by mouth daily. 07/01/22   [provider]  Budeson-Glycopyrrol-Formoterol  (BREZTRI  AEROSPHERE) 160-9-4.8 MCG/ACT AERO Inhale 2 puffs into the lungs in the morning and at bedtime. 02/11/23   Hope Almarie ORN, NP  docusate sodium  (COLACE) 100 MG capsule Take 1 capsule (100 mg total) by mouth 2 (two) times daily. 07/01/23   Darci Pore, MD  folic acid  (FOLVITE ) 1 MG  tablet Take 1 mg by mouth daily. 11/01/22   [provider]  furosemide  (LASIX ) 40 MG tablet Take 40 mg by mouth in the morning. 03/06/22   [provider]  ipratropium-albuterol  (DUONEB) 0.5-2.5 (3) MG/3ML SOLN Take 3 mLs by nebulization daily. 03/29/23   [provider]  melatonin 3 MG TABS tablet Take 1 tablet (3 mg total) by mouth at bedtime as needed. 07/01/23 07/31/23  Darci Pore, MD  methocarbamol  (ROBAXIN ) 500 MG tablet Take 1 tablet (500 mg total) by mouth every 8 (eight) hours as needed for muscle spasms. 03/24/23   Vernon Ranks, MD  methotrexate  50 MG/2ML injection Inject 20 mg into the skin every Sunday.    [provider]  Multiple Vitamins-Minerals (MULTIVITAMIN WITH MINERALS) tablet Take 1 tablet by mouth daily with breakfast.    [provider]  omeprazole (PRILOSEC OTC) 20 MG tablet Take 20 mg by mouth daily before breakfast.    [provider]  OXYGEN  Inhale 4 L/min into the lungs continuous.    [provider]  polyethylene glycol (MIRALAX  / GLYCOLAX ) 17 g packet Take 17 g by mouth daily as needed for mild constipation. 07/09/22   Shalhoub, Zachary PARAS, MD  predniSONE  (DELTASONE ) 10 MG tablet Take 4 tablets (40 mg total) by mouth daily with breakfast for 3 days, THEN 3 tablets (30 mg total) daily with breakfast for 3 days, THEN 2 tablets (20 mg total) daily with breakfast for 3 days, THEN 1 tablet (10 mg total) daily with breakfast for 3 days. 07/02/23 07/14/23  Darci Pore, MD  senna-docusate (SENOKOT-S) 8.6-50 MG tablet Take 2 tablets by mouth at bedtime. 07/01/23   Darci Pore, MD  sertraline  (ZOLOFT ) 100 MG tablet Take 100 mg by mouth daily. 02/22/23   [provider]  sulfamethoxazole -trimethoprim  (BACTRIM  DS) 800-160 MG tablet Take 1 tablet by mouth every 12 (twelve) hours for 10 days. 07/01/23 07/11/23  Darci Pore, MD  TYLENOL  500 MG tablet Take 500 mg by mouth every 8 (eight)  hours as needed for mild pain (pain score 1-3) or headache.    [provider]  vitamin B-12 (CYANOCOBALAMIN ) 1000 MCG tablet Take 1,000 mcg by mouth daily.    [provider]    Physical Exam: BP (!) 165/68 (BP Location: Left Arm)   Pulse (!) 107   Temp 98.2 F (36.8 C) (Oral)   Resp (!) 38   SpO2 95%  General: Pleasant, well-appearing elderly man laying in bed.  In mild respiratory distress. HEENT: [place]/AT. Anicteric sclera CV: Tachycardic.  Regular rhythm. No murmurs, rubs, or gallops. No LE edema Pulmonary: On HFNC at 7 L. Tachypneic. Conversational dyspnea and increased work of breathing. No accessory muscle use. Mild expiratory wheezing in the upper lung fields.  Decreased air movement throughout. Abdominal: Soft, nontender, nondistended. Normal bowel sounds. Extremities: Palpable radial and DP pulses. Normal ROM. Skin: Warm and dry. No obvious rash or lesions. Neuro: A&Ox3. Moves all extremities. Normal sensation to light touch. No focal deficit. Psych: Normal mood and affect  Labs on Admission:  Basic Metabolic Panel: Recent Labs  Lab 07/01/23 0314 07/05/23 1455  NA 136 135  K 4.2 4.4  CL 94* 99  CO2 32 26  GLUCOSE 103* 196*  BUN 20 14  CREATININE 0.76 0.66  CALCIUM  8.9 8.2*   Liver Function Tests: Recent Labs  Lab 07/05/23 1455  AST 25  ALT 25  ALKPHOS 101  BILITOT 0.8  PROT 6.3*  ALBUMIN 3.2*   No results for input(s): LIPASE, AMYLASE in the last 168 hours. No results for input(s): AMMONIA in the last 168 hours. CBC: Recent Labs  Lab 07/05/23 1455  WBC 8.1  NEUTROABS 7.4  HGB 14.0  HCT 43.0  MCV 107.0*  PLT 152   Cardiac Enzymes: No results for input(s): CKTOTAL, CKMB, CKMBINDEX, TROPONINI in the last 168 hours. BNP (last 3 results) Recent Labs    03/29/23 1742 04/02/23 1043 06/24/23 1930  BNP 136.4* 99.7 35.6    ProBNP (last 3 results) No results for input(s): PROBNP in the last 8760  hours.  CBG: No results for input(s): GLUCAP in the last 168 hours.  Radiological Exams on Admission: DG Chest Port 1 View Result Date: 07/05/2023 CLINICAL DATA:  Shortness of breath. EXAM: PORTABLE CHEST 1 VIEW COMPARISON:  06/24/2023. FINDINGS: Stable cardiomediastinal silhouette. Persistent diffuse interstitial coarsening with emphysema. Similar asymmetric patchy opacity in the left lung. No pleural effusion or pneumothorax. No acute osseous abnormality. IMPRESSION: Persistent diffuse interstitial coarsening with similar asymmetric opacity in the left lung, concerning for superimposed infectious/inflammatory process. Electronically Signed   By: Harrietta Sherry M.D.   On: 07/05/2023 15:57   Assessment/Plan Garen Woolbright Zell is a 78 y.o. 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 - CXR with persistent infiltrate but slightly improved - S/p IV solumedrol, IV mag and multiple DuoNebs - Start IV Solu-Medrol  40 mg every 12 hours - Start daily IV azithromycin  500 mg x 4 days - Continue Breztri  and scheduled DuoNebs  - Check full respiratory viral panel - Continue supplemental O2, wean as able - As needed BiPAP  # Recent history of multifocal pneumonia - Chest imaging on 6/16 showed multifocal pneumonia, repeat imaging on admission shows mild improvement - Sputum cultures grew stenotrophomonas, patient discharged home on  14 days of Bactrim  - Continue Bactrim , day 4/10 - Trend CBC, fever curve  # Hyperglycemia - In the setting of steroid use, no documented history of diabetes - Blood sugar elevated to 196 on CMP - SSI with meals, CBG monitoring  # HFpEF - Last TTE on 06/2022 showed EF 65-70%, G1DD but no valvular abnormalities. - Patient euvolemic on exam - Continue Lasix   # HTN - BP elevated with SBP in the 160s - Continue amlodipine   # CAD # HLD - Continue atorvastatin   # GAD - Continue sertraline  and as needed Xanax   # Rheumatoid arthritis - Continue weekly methotrexate   # Chronic constipation - Reports last bowel movement yesterday, no abdominal pain or distention -  Continue daily Colace and Senokot-S  # Generalized weakness - PT/OT eval and treat  DVT prophylaxis: Lovenox      Code Status: Full Code  Consults called: None  Family Communication: Discussed admission with spouse at bedside  Severity of Illness: The appropriate patient status for this patient is INPATIENT. Inpatient status is judged to be reasonable and necessary in order to provide the required intensity of service to ensure the patient's safety. The patient's presenting symptoms, physical exam findings, and initial radiographic and laboratory data in the context of their chronic comorbidities is felt to place them at high risk for further clinical deterioration. Furthermore, it is not anticipated that the patient will be medically stable for discharge from the hospital within 2 midnights of admission.   * I certify that at the point of admission it is my clinical judgment that the patient will require inpatient hospital care spanning beyond 2 midnights from the point of admission due to high intensity of service, high risk for further deterioration and high frequency of surveillance required.*  Level of care: Progressive   This record has been created using Conservation officer, historic buildings. Errors have been sought  and corrected, but may not always be located. Such creation errors do not reflect on the standard of care.   Lou Claretta HERO, MD 07/05/2023, 4:21 PM [facility] Pager: [phone] Isaiah 41:10   If 7PM-7AM, please contact night-coverage [email or web address] Password [email or web address]

## 2023-07-05 NOTE — Progress Notes (Signed)
   07/05/23 2318  BiPAP/CPAP/SIPAP  BiPAP/CPAP/SIPAP Pt Type Adult  BiPAP/CPAP/SIPAP SERVO  Mask Type Full face mask  Dentures removed? Not applicable  Mask Size Medium  Set Rate 15 breaths/min  Respiratory Rate 26 breaths/min  IPAP 10 cmH20  EPAP 5 cmH2O  FiO2 (%) 40 %  Minute Ventilation 17.7  Leak 26  Peak Inspiratory Pressure (PIP) 15  Tidal Volume (Vt) 726  Patient Home Machine No  Patient Home Mask No  Patient Home Tubing No  Auto Titrate No  Press High Alarm 30 cmH2O  Press Low Alarm 2 cmH2O  Device Plugged into RED Power Outlet Yes  Oxygen  Percent 40 %  BiPAP/CPAP /SiPAP Vitals  Pulse Rate 95  Resp (!) 26  SpO2 96 %  MEWS Score/Color  MEWS Score 2  MEWS Score Color Yellow

## 2023-07-05 NOTE — ED Provider Notes (Signed)
 [name] AT [facility] Provider Note   CSN: [id number] Arrival date & time: 07/05/23  1409     Patient presents with: Shortness of Breath   [name] [name] is a 78 y.o. male.   Pt is a 77y/o male with history of severe COPD, chronic hypoxic respiratory failure on 4 L of oxygen  at baseline, rheumatoid arthritis, heart failure with preserved ejection fraction, CAD, hypertension, hyperlipidemia, mood disorder who is currently at rehab and is returning today due to worsening shortness of breath.  [name] [name] was discharged from the hospital on Monday and reports that since leaving [name] [name] has just gradually been getting worse and today was the worst.  [name] [name] has continued to have a dry cough with occasional sputum production and is still taking antibiotics.  [name] [name] has not had a fever, abdominal pain or vomiting.  EMS report when they arrived today his sats were in the 70s on his home 3 L.  [name] [name] was started on albuterol  and Atrovent a total of 10 and 0.5 and given 125 of Solu-Medrol .  Initially they report his breath sounds were very tight but now [name] [name] is wheezing more.  Patient is still complaining of tightness in his chest and shortness of breath.   Shortness of Breath      Prior to Admission medications   Medication Sig Start Date End Date Taking? Authorizing Provider  albuterol  (VENTOLIN  HFA) 108 (90 Base) MCG/ACT inhaler Inhale 2 puffs into the lungs every 6 (six) hours as needed for wheezing or shortness of breath. Patient taking differently: Inhale 2 puffs into the lungs every 4 (four) hours as needed for shortness of breath or wheezing. 12/08/21   Jude Harden GAILS, MD  ALPRAZolam  (XANAX ) 0.25 MG tablet Take 1 tablet (0.25 mg total) by mouth 3 (three) times daily as needed for anxiety. 07/01/23   Darci Pore, MD  amLODipine  (NORVASC ) 5 MG tablet Take 5 mg by mouth daily. 11/23/22   [provider]  atorvastatin  (LIPITOR) 20 MG tablet Take 20 mg by mouth  daily. 07/01/22   [provider]  Budeson-Glycopyrrol-Formoterol  (BREZTRI  AEROSPHERE) 160-9-4.8 MCG/ACT AERO Inhale 2 puffs into the lungs in the morning and at bedtime. 02/11/23   Hope Almarie ORN, NP  docusate sodium  (COLACE) 100 MG capsule Take 1 capsule (100 mg total) by mouth 2 (two) times daily. 07/01/23   Darci Pore, MD  folic acid  (FOLVITE ) 1 MG tablet Take 1 mg by mouth daily. 11/01/22   [provider]  furosemide  (LASIX ) 40 MG tablet Take 40 mg by mouth in the morning. 03/06/22   [provider]  ipratropium-albuterol  (DUONEB) 0.5-2.5 (3) MG/3ML SOLN Take 3 mLs by nebulization daily. 03/29/23   [provider]  melatonin 3 MG TABS tablet Take 1 tablet (3 mg total) by mouth at bedtime as needed. 07/01/23 07/31/23  Darci Pore, MD  methocarbamol  (ROBAXIN ) 500 MG tablet Take 1 tablet (500 mg total) by mouth every 8 (eight) hours as needed for muscle spasms. 03/24/23   Vernon Ranks, MD  methotrexate  50 MG/2ML injection Inject 20 mg into the skin every Sunday.    [provider]  Multiple Vitamins-Minerals (MULTIVITAMIN WITH MINERALS) tablet Take 1 tablet by mouth daily with breakfast.    [provider]  omeprazole (PRILOSEC OTC) 20 MG tablet Take 20 mg by mouth daily before breakfast.    [provider]  OXYGEN  Inhale 4 L/min into the lungs continuous.    [provider]  polyethylene glycol (  MIRALAX  / GLYCOLAX ) 17 g packet Take 17 g by mouth daily as needed for mild constipation. 07/09/22   Shalhoub, Zachary PARAS, MD  predniSONE  (DELTASONE ) 10 MG tablet Take 4 tablets (40 mg total) by mouth daily with breakfast for 3 days, THEN 3 tablets (30 mg total) daily with breakfast for 3 days, THEN 2 tablets (20 mg total) daily with breakfast for 3 days, THEN 1 tablet (10 mg total) daily with breakfast for 3 days. 07/02/23 07/14/23  Darci Pore, MD  senna-docusate (SENOKOT-S) 8.6-50 MG tablet Take 2 tablets by mouth  at bedtime. 07/01/23   Darci Pore, MD  sertraline  (ZOLOFT ) 100 MG tablet Take 100 mg by mouth daily. 02/22/23   [provider]  sulfamethoxazole -trimethoprim  (BACTRIM  DS) 800-160 MG tablet Take 1 tablet by mouth every 12 (twelve) hours for 10 days. 07/01/23 07/11/23  Darci Pore, MD  TYLENOL  500 MG tablet Take 500 mg by mouth every 8 (eight) hours as needed for mild pain (pain score 1-3) or headache.    [provider]  vitamin B-12 (CYANOCOBALAMIN ) 1000 MCG tablet Take 1,000 mcg by mouth daily.    [provider]    Allergies: Fluticasone -salmeterol    Review of Systems  Respiratory:  Positive for shortness of breath.     Updated Vital Signs BP (!) 165/68 (BP Location: Left Arm)   Pulse (!) 107   Temp 98.2 F (36.8 C) (Oral)   Resp (!) 38   SpO2 95%   Physical Exam Vitals and nursing note reviewed.  Constitutional:      General: [name] [name] is not in acute distress.    Appearance: [name] [name] is well-developed.  HENT:     Head: Normocephalic and atraumatic.   Eyes:     Conjunctiva/sclera: Conjunctivae normal.     Pupils: Pupils are equal, round, and reactive to light.    Cardiovascular:     Rate and Rhythm: Normal rate and regular rhythm.     Heart sounds: No murmur heard. Pulmonary:     Effort: Tachypnea and accessory muscle usage present. No respiratory distress.     Breath sounds: Wheezing present. No rales.  Abdominal:     General: There is no distension.     Palpations: Abdomen is soft.     Tenderness: There is no abdominal tenderness. There is no guarding or rebound.   Musculoskeletal:        General: No tenderness. Normal range of motion.     Cervical back: Normal range of motion and neck supple.     Right lower leg: No edema.     Left lower leg: No edema.   Skin:    General: Skin is warm and dry.     Findings: No erythema or rash.   Neurological:     Mental Status: [name] [name] is alert and oriented to person, place, and time. Mental  status is at baseline.   Psychiatric:        Behavior: Behavior normal.     (all labs ordered are listed, but only abnormal results are displayed) Labs Reviewed  CBC WITH DIFFERENTIAL/PLATELET - Abnormal; Notable for the following components:      Result Value   RBC 4.02 (*)    MCV 107.0 (*)    MCH 34.8 (*)    Lymphs Abs 0.4 (*)    Abs Immature Granulocytes 0.15 (*)    All other components within normal limits  COMPREHENSIVE METABOLIC PANEL WITH GFR - Abnormal; Notable for the following components:   Glucose,  Bld 196 (*)    Calcium  8.2 (*)    Total Protein 6.3 (*)    Albumin 3.2 (*)    All other components within normal limits  BLOOD GAS, VENOUS - Abnormal; Notable for the following components:   pO2, Ven 78 (*)    Bicarbonate 31.1 (*)    Acid-Base Excess 5.5 (*)    All other components within normal limits    EKG: EKG Interpretation Date/Time:  Friday July 05 2023 14:52:56 EDT Ventricular Rate:  112 PR Interval:  131 QRS Duration:  87 QT Interval:  333 QTC Calculation: 455 R Axis:   33  Text Interpretation: Sinus tachycardia Abnormal inferior Q waves No significant change since last tracing Confirmed by Doretha Folks ([id number]) on 07/05/2023 3:17:42 PM  Radiology: [facility] Chest Port 1 View Result Date: 07/05/2023 CLINICAL DATA:  Shortness of breath. EXAM: PORTABLE CHEST 1 VIEW COMPARISON:  06/24/2023. FINDINGS: Stable cardiomediastinal silhouette. Persistent diffuse interstitial coarsening with emphysema. Similar asymmetric patchy opacity in the left lung. No pleural effusion or pneumothorax. No acute osseous abnormality. IMPRESSION: Persistent diffuse interstitial coarsening with similar asymmetric opacity in the left lung, concerning for superimposed infectious/inflammatory process. Electronically Signed   By: Harrietta Sherry M.D.   On: 07/05/2023 15:57     Procedures   Medications Ordered in the ED  albuterol  (PROVENTIL ) (2.5 MG/3ML) 0.083% nebulizer solution (   Canceled Entry 07/05/23 1516)  albuterol  (PROVENTIL ) (2.5 MG/3ML) 0.083% nebulizer solution (15 mg/hr Nebulization Given 07/05/23 1454)  magnesium  sulfate IVPB 2 g 50 mL (2 g Intravenous New Bag/Given 07/05/23 1451)                                    Medical Decision Making Amount and/or Complexity of Data Reviewed Labs: ordered. Decision-making details documented in ED Course. Radiology: ordered and independent interpretation performed. Decision-making details documented in ED Course. ECG/medicine tests: ordered and independent interpretation performed. Decision-making details documented in ED Course.  Risk Prescription drug management.   Pt with multiple medical problems and comorbidities and presenting today with a complaint that caries a high risk for morbidity and mortality.  Here today with shortness of breath.  Concern for recurrent COPD exacerbation.  Patient did have multifocal pneumonia but is still taking antibiotics at this time and is denying significant infectious symptoms.  [name] [name] is tachypneic, accessory muscle use and wheezing diffusely.  [name] [name] has already had albuterol , Atrovent, Solu-Medrol .  Patient was placed on BiPAP, continuous albuterol  and magnesium  given here.  Low suspicion for PE or worsening pneumonia.  No findings to suggest fluid overload resulting from CHF.  4:20 PM I independently interpreted patient's labs and EKG.  EKG without acute findings other than sinus tachycardia.  CBC within normal limits, CMP without acute findings, blood gas with normal CO2.  I have independently visualized and interpreted pt's images today. Chest x-ray with persistent opacities slightly improved from prior.  Radiology reports persistent diffuse interstitial coarsening and similar asymmetric appearance concerning for inflammation or infection.  On repeat evaluation patient's work of breathing and wheezing is significantly improved.  Will try him off BiPAP to see how [name] [name] does.  Patient will need  admission for the above issues.  Concern for COPD exacerbation.  Spoke with the patient and [name] [name] is agreeable to this plan.  Consulted hospitalist for admission.  CRITICAL CARE Performed by: Jodi Criscuolo Total critical care time: 30 minutes Critical care time was exclusive  of separately billable procedures and treating other patients. Critical care was necessary to treat or prevent imminent or life-threatening deterioration. Critical care was time spent personally by me on the following activities: development of treatment plan with patient and/or surrogate as well as nursing, discussions with consultants, evaluation of patient's response to treatment, examination of patient, obtaining history from patient or surrogate, ordering and performing treatments and interventions, ordering and review of laboratory studies, ordering and review of radiographic studies, pulse oximetry and re-evaluation of patient's condition.       Final diagnoses:  COPD exacerbation (HCC)  Acute on chronic respiratory failure with hypoxia [facility])    ED Discharge Orders     [name]          Doretha Folks, MD 07/05/23 1620

## 2023-07-05 NOTE — Progress Notes (Signed)
   07/05/23 1454  BiPAP/CPAP/SIPAP  $ Non-Invasive Ventilator  Non-Invasive Vent Set Up;Non-Invasive Vent Initial  $ Face Mask Medium Yes  BiPAP/CPAP/SIPAP Pt Type Adult  BiPAP/CPAP/SIPAP (S)  SERVO  Mask Type Full face mask  Dentures removed? Not applicable  Mask Size Medium  Set Rate (S)  15 breaths/min  Respiratory Rate 33 breaths/min  IPAP (S)  10 cmH20  EPAP (S)  5 cmH2O  FiO2 (%) (S)  40 %  Flow Rate 0.9 lpm  Minute Ventilation 18.4  Leak 20  Peak Inspiratory Pressure (PIP) 16  Tidal Volume (Vt) 803  Patient Home Machine No  Patient Home Mask No  Patient Home Tubing No  Auto Titrate No  Press High Alarm 25 cmH2O  Nasal massage performed No (comment)  CPAP/SIPAP surface wiped down Yes  Device Plugged into RED Power Outlet Yes  BiPAP/CPAP /SiPAP Vitals  Pulse Rate (!) 107  Resp (!) 38  SpO2 95 %  Bilateral Breath Sounds Diminished  MEWS Score/Color  MEWS Score 4  MEWS Score Color Red

## 2023-07-05 NOTE — ED Triage Notes (Signed)
 Pt BIBA from [facility] for shob, was recently dc from here with pna. Progressively worse since then. Very labored on scene, somewhat better after 10 albuterol , .5 atrovent. Worse with exertion. Diminished both sides, more on left. 125mg  solumedrol, 20ga LW. Baseline 4-5L [place]  SpO2 low 90s, down to 70s with exertion. 98% on neb HR 115 Rr 30s

## 2023-07-06 DIAGNOSIS — J9621 Acute and chronic respiratory failure with hypoxia: Secondary | ICD-10-CM | POA: Diagnosis not present

## 2023-07-06 DIAGNOSIS — J441 Chronic obstructive pulmonary disease with (acute) exacerbation: Secondary | ICD-10-CM | POA: Diagnosis not present

## 2023-07-06 LAB — HEMOGLOBIN A1C
Hgb A1c MFr Bld: 5.5 % (ref 4.8–5.6)
Mean Plasma Glucose: 111.15 mg/dL

## 2023-07-06 LAB — BASIC METABOLIC PANEL WITH GFR
Anion gap: 10 (ref 5–15)
BUN: 16 mg/dL (ref 8–23)
CO2: 32 mmol/L (ref 22–32)
Calcium: 9.2 mg/dL (ref 8.9–10.3)
Chloride: 97 mmol/L — ABNORMAL LOW (ref 98–111)
Creatinine, Ser: 0.53 mg/dL — ABNORMAL LOW (ref 0.61–1.24)
GFR, Estimated: >60 mL/min (ref >60)
Glucose, Bld: 120 mg/dL — ABNORMAL HIGH (ref 70–99)
Potassium: 4.4 mmol/L (ref 3.5–5.1)
Sodium: 139 mmol/L (ref 135–145)

## 2023-07-06 LAB — CBC
HCT: 39.3 % (ref 39.0–52.0)
Hemoglobin: 12.9 g/dL — ABNORMAL LOW (ref 13.0–17.0)
MCH: 35.2 pg — ABNORMAL HIGH (ref 26.0–34.0)
MCHC: 32.8 g/dL (ref 30.0–36.0)
MCV: 107.4 fL — ABNORMAL HIGH (ref 80.0–100.0)
Platelets: 149 10*3/uL — ABNORMAL LOW (ref 150–400)
RBC: 3.66 MIL/uL — ABNORMAL LOW (ref 4.22–5.81)
RDW: 15.5 % (ref 11.5–15.5)
WBC: 12 10*3/uL — ABNORMAL HIGH (ref 4.0–10.5)
nRBC: 0 % (ref 0.0–0.2)

## 2023-07-06 LAB — GLUCOSE, CAPILLARY
Glucose-Capillary: 115 mg/dL — ABNORMAL HIGH (ref 70–99)
Glucose-Capillary: 150 mg/dL — ABNORMAL HIGH (ref 70–99)
Glucose-Capillary: 120 mg/dL — ABNORMAL HIGH (ref 70–99)
Glucose-Capillary: 103 mg/dL — ABNORMAL HIGH (ref 70–99)

## 2023-07-06 NOTE — Progress Notes (Signed)
  Progress Note   Patient: [name] [name] FMW:[id number] DOB: [birth year] DOA: 07/05/2023     1 DOS: the patient was seen and examined on 07/06/2023   Brief hospital course: 78 y.o. 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 - CXR with persistent infiltrate but slightly improved - Cont IV Solu-Medrol  40 mg every 12 hours - On daily IV azithromycin  500 mg x 4 days - Continue Breztri  and scheduled DuoNebs  - respiratory viral panel neg - Continue supplemental O2, wean as able - As needed BiPAP   # Recent history of multifocal pneumonia - Chest imaging on 6/16 showed multifocal pneumonia, repeat imaging on admission shows mild improvement - Sputum cultures grew stenotrophomonas, patient discharged home on 14 days of Bactrim  - Continue Bactrim , day 5/10 - Trend CBC, fever curve   # Hyperglycemia - In the setting of steroid use, no documented history of diabetes - cont SSI as needed   # HFpEF - Last TTE on 06/2022 showed EF 65-70%, G1DD but no valvular abnormalities. - Patient euvolemic on exam - Continue Lasix    # HTN - BP elevated with SBP in the 160s - Continue amlodipine    # CAD # HLD - Continue atorvastatin    # GAD - Continue sertraline  and as needed Xanax    # Rheumatoid arthritis - Continue weekly methotrexate    # Chronic constipation - Reports last bowel movement yesterday,  no abdominal pain or distention - Continue daily Colace and Senokot-S   # Generalized weakness - PT/OT consulted      Subjective: Reports breathing better  Physical Exam: Vitals:   07/06/23 0729 07/06/23 0817 07/06/23 1151 07/06/23 1509  BP: 114/62     Pulse: 85     Resp: 20     Temp: 97.7 F (36.5 C)     TempSrc: Oral     SpO2: 100% 100% 95% 93%  Weight:      Height:       General exam: Awake, laying in bed, in nad Respiratory system: Normal respiratory effort, decreased BS Cardiovascular system: regular rate, s1, s2 Gastrointestinal system: Soft, nondistended, positive BS Central nervous system: CN2-12 grossly intact, strength intact Extremities: Perfused, no clubbing Skin: Normal skin turgor, no notable skin lesions seen Psychiatry: Mood normal // no visual hallucinations   Data Reviewed:  Labs reviewed: Na 139, K 4.4, Cr 0.53, wBC 12.0, Hgb 12.9, Plts 149  Family Communication: Pt in room, family at bedside  Disposition: Status is: Inpatient Remains inpatient appropriate because: severity of illness  Planned Discharge Destination: Unclear at this time, pending PT eval     Author: Garnette Pelt, MD 07/06/2023 4:37 PM  For on call review [email or web address].

## 2023-07-06 NOTE — Progress Notes (Signed)
   07/06/23 2301  BiPAP/CPAP/SIPAP  BiPAP/CPAP/SIPAP Pt Type Adult  BiPAP/CPAP/SIPAP SERVO  Mask Type Full face mask  Dentures removed? Not applicable  Mask Size Medium  Set Rate 15 breaths/min  Respiratory Rate 30 breaths/min  IPAP 10 cmH20  EPAP 5 cmH2O  FiO2 (%) 40 %  Minute Ventilation 13.9  Leak 34  Peak Inspiratory Pressure (PIP) 14  Tidal Volume (Vt) 730  Patient Home Machine No  Patient Home Mask No  Patient Home Tubing No  Auto Titrate No  Press High Alarm 30 cmH2O  Press Low Alarm 5 cmH2O  Device Plugged into RED Power Outlet Yes  Oxygen  Percent 40 %  BiPAP/CPAP /SiPAP Vitals  Pulse Rate 96  Resp (!) 30  SpO2 95 %  MEWS Score/Color  MEWS Score 2  MEWS Score Color Yellow

## 2023-07-06 NOTE — TOC Initial Note (Signed)
 Transition of Care [facility]) - Initial/Assessment Note    Patient Details  Name: [name] [name] MRN: [id number] Date of Birth: 07/07/1945  Transition of [facility] CM/SW Contact:    Sonda Manuella Quill, RN Phone Number: 07/06/2023, 4:50 PM  Clinical Narrative:                 Beatris w/ pt in room; pt says he was previously at [facility]; he says he is not returning at d/c; he would like another facility if SNF recc; he identified POC dtr [employer] [phone]); transportation TBD; pt verified insurance/PCP; he denied SDOH risks; pt says he has cane, walker, Rollator, wheelchair, shower chair; pt says he previously had HHPT w/ [facility];he has  home oxygen  w/ [employer]; awaiting PT/OT evals; TOC is following.  Expected Discharge Plan: Skilled Nursing Facility Barriers to Discharge: Continued Medical Work up   Patient Goals and CMS Choice Patient states their goals for this hospitalization and ongoing recovery are:: SNF CMS Medicare.gov Compare Post Acute Care list provided to:: Patient    ownership interest in [facility].provided to:: Patient    Expected Discharge Plan and Services   Discharge Planning Services: CM Consult   Living arrangements for the past 2 months: Skilled Nursing Facility                                      Prior Living Arrangements/Services Living arrangements for the past 2 months: Skilled Nursing Facility Lives with:: Facility Resident Patient language and need for interpreter reviewed:: Yes Do you feel safe going back to the place where you live?: Yes      Need for Family Participation in Patient Care: Yes (Comment) Care giver support system in place?: Yes (comment) Current home services: DME (cane, walker, wheelchair, shower chair; previous HHPT w/ [facility]; home oxygen  w/ [employer]) Criminal Activity/Legal Involvement Pertinent to Current Situation/Hospitalization: No - Comment as needed  Activities of  Daily Living   ADL Screening (condition at time of admission) Independently performs ADLs?: Yes (appropriate for developmental age) Is the patient deaf or have difficulty hearing?: No Does the patient have difficulty seeing, even when wearing glasses/contacts?: No Does the patient have difficulty concentrating, remembering, or making decisions?: No  Permission Sought/Granted      Share Information with NAME: Case Manager     Permission granted to share info w Relationship: [name] [name] (dtr) [phone]     Emotional Assessment Appearance:: Appears stated age Attitude/Demeanor/Rapport: Gracious Affect (typically observed): Accepting Orientation: : Oriented to Self, Oriented to Place, Oriented to  Time, Oriented to Situation Alcohol / Substance Use: Not Applicable Psych Involvement: No (comment)  Admission diagnosis:  COPD exacerbation (HCC) [J44.1] Acute on chronic respiratory failure with hypoxia (HCC) [J96.21] Patient Active Problem List   Diagnosis Date Noted   COPD exacerbation (HCC) 07/05/2023   Hyponatremia 06/25/2023   Multifocal pneumonia 06/24/2023   COPD with acute exacerbation (HCC) 03/29/2023   Acute on chronic diastolic CHF (congestive heart failure) (HCC) 03/29/2023   Pleural effusion 03/23/2023   Rib fracture 03/22/2023   Abnormal CT lung screening 11/12/2022   Rheumatoid arthritis (HCC) 11/12/2022   Constipation 07/08/2022   GERD without esophagitis 07/08/2022   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   Shortness of breath 07/06/2022   Acute on chronic respiratory failure with hypoxia (HCC) 07/04/2022   Alcohol abuse 07/04/2022   Macrocytosis 07/04/2022   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 AT [address] OF [facility] [address] [place] [address] Phone: [phone] Fax: [phone]     Social Drivers of Health (SDOH) Social History: SDOH Screenings   Food Insecurity: No Food Insecurity (07/06/2023)  Housing: Low Risk  (07/06/2023)  Transportation Needs: No Transportation Needs (07/06/2023)  Utilities: Not At Risk (07/06/2023)  Depression (PHQ2-9): Low Risk  (03/09/2019)  Social Connections: Socially Isolated (07/06/2023)  Tobacco Use: Medium Risk (06/24/2023)   SDOH Interventions: Food Insecurity Interventions: Intervention Not Indicated, Inpatient TOC Housing Interventions: Intervention Not Indicated, Inpatient TOC Transportation Interventions: Intervention Not Indicated, Inpatient TOC Utilities Interventions: Intervention Not Indicated, Inpatient TOC   Readmission Risk Interventions    07/06/2023    4:46 PM 06/26/2023    2:14 PM 03/31/2023    3:55 PM  Readmission Risk Prevention Plan  Transportation Screening Complete Complete Complete  PCP or Specialist Appt within 3-5 Days Complete  Complete  HRI or Home Care Consult Complete  Complete  Social Work Consult for Recovery Care Planning/Counseling Complete  Complete  Palliative Care Screening Not Applicable  Not Applicable  Medication Review Oceanographer) Complete Complete Complete  PCP or Specialist appointment within 3-5 days of discharge  Complete   HRI or Home Care Consult  Complete   SW Recovery Care/Counseling Consult  Complete   Palliative Care Screening  Not Applicable   Skilled Nursing Facility  Complete

## 2023-07-06 NOTE — Hospital Course (Addendum)
 78 y.o. 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

## 2023-07-06 NOTE — Evaluation (Signed)
 Physical Therapy Evaluation Patient Details Name: [name] [name] MRN: [id number] DOB: 22-Dec-1945 Today's Date: 07/06/2023  History of Present Illness  Pt is a 78 y.o. male admitted for COPD exacerbation and acute on chronic respiratory failure with hypoxia.  Patient was recently hospitalized for multifocal pneumonia and COPD exacerbation from 6/16 to 6/23 and discharged to rehab facility. EFY:dzczmz COPD, chronic hypoxic respiratory failure on 4 L of oxygen  at baseline, rheumatoid arthritis, heart failure with preserved ejection fraction, CAD, hypertension, hyperlipidemia, generalized anxiety disorder  Clinical Impression  Pt admitted with above diagnosis.  Pt currently with functional limitations due to the deficits listed below (see PT Problem List). Pt will benefit from acute skilled PT to increase their independence and safety with mobility to allow discharge.  Pt sitting in recliner on arrival and eager to ambulate.  Pt donned his shoes from home and SPO2 dropped into 70s so required brief recovery prior to ambulating.  Pt provided with CGA for safety and reports some dizziness however not significant and did not worsen.  SPO2 monitored with ambulation (see ambulation section below) and pt currently requiring at least 6L HFNC for ambulation.  Pt reports he had started ambulating approx 50 ft with PT in rehab and currently plans to return to SNF to complete rehab upon d/c.         If plan is discharge home, recommend the following: A little help with walking and/or transfers;Assistance with cooking/housework;A little help with bathing/dressing/bathroom;Help with stairs or ramp for entrance;Assist for transportation   Can travel by private vehicle   Yes    Equipment Recommendations None recommended by PT  Recommendations for Other Services       Functional Status Assessment Patient has had a recent decline in their functional status and demonstrates the ability to make significant  improvements in function in a reasonable and predictable amount of time.     Precautions / Restrictions Precautions Precautions: Fall Precaution/Restrictions Comments: monitor sats, currently on 5L at rest and 6L with activity (HFNC)      Mobility  Bed Mobility               General bed mobility comments: pt in recliner on arrival, SpO2 94% on 5L HFNC at rest    Transfers Overall transfer level: Needs assistance Equipment used: Rolling walker (2 wheels) Transfers: Sit to/from Stand Sit to Stand: Contact guard assist           General transfer comment: good use of UEs to self assist    Ambulation/Gait Ambulation/Gait assistance: Contact guard assist Gait Distance (Feet): 40 Feet (x2) Assistive device: Rolling walker (2 wheels) Gait Pattern/deviations: Step-through pattern, Decreased stride length       General Gait Details: verbal cues for use of RW, 40'x 2 with standing rest break; SpO2 dropped to 85% on 6L with first standing rest break and then 79% on 6L HFNC upon returning to recliner; improved to 95% within 3 minutes and pt placed back on 5L  Stairs            Wheelchair Mobility     Tilt Bed    Modified Rankin (Stroke Patients Only)       Balance Overall balance assessment: Needs assistance Sitting-balance support: No upper extremity supported, Feet supported Sitting balance-Leahy Scale: Good     Standing balance support: No upper extremity supported Standing balance-Leahy Scale: Fair  Pertinent Vitals/Pain Pain Assessment Pain Assessment: No/denies pain Pain Intervention(s): Monitored during session, Repositioned    Home Living Family/patient expects to be discharged to:: Skilled nursing facility Living Arrangements: Children Available Help at Discharge: Family;Available PRN/intermittently Type of Home: Apartment Home Access: Level entry       Home Layout: One level Home Equipment:  Grab bars - toilet;Grab bars - tub/shower;Cane - single Librarian, academic (2 wheels);Rollator (4 wheels);Shower seat;Hand held shower head;Adaptive equipment;BSC/3in1 Additional Comments: on 3L home O2; daughter has been staying with family but she works, children are local and can help at times    Prior Function Prior Level of Function : Independent/Modified Independent;Driving (all information prior to last admission; pt admitted from SNF and plans to return to SNF)             Mobility Comments: Uses SPC primarily, has not been driving recently ADLs Comments: 2x/week bathes, otherwise sponge bathes, dresses self. Programmer, applications 1x/month, splits other cleaning with daughter     Extremity/Trunk Assessment        Lower Extremity Assessment Lower Extremity Assessment: Generalized weakness       Communication   Communication Communication: No apparent difficulties    Cognition Arousal: Alert Behavior During Therapy: WFL for tasks assessed/performed   PT - Cognitive impairments: No apparent impairments                         Following commands: Intact       Cueing       General Comments      Exercises     Assessment/Plan    PT Assessment Patient needs continued PT services  PT Problem List Decreased strength;Cardiopulmonary status limiting activity;Decreased activity tolerance;Decreased mobility;Decreased knowledge of use of DME       PT Treatment Interventions DME instruction;Therapeutic exercise;Gait training;Functional mobility training;Therapeutic activities;Patient/family education    PT Goals (Current goals can be found in the Care Plan section)  Acute Rehab PT Goals PT Goal Formulation: With patient Time For Goal Achievement: 07/20/23 Potential to Achieve Goals: Good    Frequency Min 3X/week     Co-evaluation               AM-PAC PT 6 Clicks Mobility  Outcome Measure Help needed turning from your back to your side while in  a flat bed without using bedrails?: None Help needed moving from lying on your back to sitting on the side of a flat bed without using bedrails?: None Help needed moving to and from a bed to a chair (including a wheelchair)?: A Little Help needed standing up from a chair using your arms (e.g., wheelchair or bedside chair)?: A Little Help needed to walk in hospital room?: A Lot Help needed climbing 3-5 steps with a railing? : A Lot 6 Click Score: 18    End of Session Equipment Utilized During Treatment: Gait belt;Oxygen  Activity Tolerance: Patient tolerated treatment well Patient left: in chair;with call bell/phone within reach Nurse Communication: Mobility status PT Visit Diagnosis: Difficulty in walking, not elsewhere classified (R26.2)    Time: 8566-8545 PT Time Calculation (min) (ACUTE ONLY): 21 min   Charges:   PT Evaluation $PT Eval Low Complexity: 1 Low   PT General Charges $$ ACUTE PT VISIT: 1 Visit        Tari KLEIN, DPT Physical Therapist Acute Rehabilitation Services Office: [phone]   Tari CROME [place] 07/06/2023, 4:48 PM

## 2023-07-07 ENCOUNTER — Encounter (HOSPITAL_COMMUNITY): Payer: Self-pay | Admitting: Student

## 2023-07-07 DIAGNOSIS — J9621 Acute and chronic respiratory failure with hypoxia: Secondary | ICD-10-CM | POA: Diagnosis not present

## 2023-07-07 DIAGNOSIS — J441 Chronic obstructive pulmonary disease with (acute) exacerbation: Secondary | ICD-10-CM | POA: Diagnosis not present

## 2023-07-07 LAB — CBC
HCT: 40.2 % (ref 39.0–52.0)
Hemoglobin: 13 g/dL (ref 13.0–17.0)
MCH: 35 pg — ABNORMAL HIGH (ref 26.0–34.0)
MCHC: 32.3 g/dL (ref 30.0–36.0)
MCV: 108.4 fL — ABNORMAL HIGH (ref 80.0–100.0)
Platelets: 157 10*3/uL (ref 150–400)
RBC: 3.71 MIL/uL — ABNORMAL LOW (ref 4.22–5.81)
RDW: 15.6 % — ABNORMAL HIGH (ref 11.5–15.5)
WBC: 9.6 10*3/uL (ref 4.0–10.5)
nRBC: 0 % (ref 0.0–0.2)

## 2023-07-07 LAB — COMPREHENSIVE METABOLIC PANEL WITH GFR
ALT: 22 U/L (ref 0–44)
AST: 20 U/L (ref 15–41)
Albumin: 3.3 g/dL — ABNORMAL LOW (ref 3.5–5.0)
Alkaline Phosphatase: 103 U/L (ref 38–126)
Anion gap: 13 (ref 5–15)
BUN: 14 mg/dL (ref 8–23)
CO2: 30 mmol/L (ref 22–32)
Calcium: 9.3 mg/dL (ref 8.9–10.3)
Chloride: 93 mmol/L — ABNORMAL LOW (ref 98–111)
Creatinine, Ser: 0.61 mg/dL (ref 0.61–1.24)
GFR, Estimated: >60 mL/min (ref >60)
Glucose, Bld: 139 mg/dL — ABNORMAL HIGH (ref 70–99)
Potassium: 4.9 mmol/L (ref 3.5–5.1)
Sodium: 136 mmol/L (ref 135–145)
Total Bilirubin: 0.8 mg/dL (ref 0.0–1.2)
Total Protein: 6.3 g/dL — ABNORMAL LOW (ref 6.5–8.1)

## 2023-07-07 LAB — GLUCOSE, CAPILLARY
Glucose-Capillary: 126 mg/dL — ABNORMAL HIGH (ref 70–99)
Glucose-Capillary: 109 mg/dL — ABNORMAL HIGH (ref 70–99)
Glucose-Capillary: 115 mg/dL — ABNORMAL HIGH (ref 70–99)
Glucose-Capillary: 110 mg/dL — ABNORMAL HIGH (ref 70–99)

## 2023-07-07 NOTE — Progress Notes (Signed)
 OT Cancellation Note  Patient Details Name: [name] [name] MRN: [id number] DOB: Apr 20, 1945   Cancelled Treatment:     OT attempted initial evaluation this am. Upon OT arrival, respiratory just started with breathing treatment and then nursing administering meds. OT will continue to follow acutely to complete eval as soon as schedule allows.   Gerardo Caiazzo OT/L Acute Rehabilitation Department  [phone]    07/07/2023, 2:43 PM

## 2023-07-07 NOTE — Progress Notes (Addendum)
   07/07/23 1143  Oxygen  Therapy/Pulse Ox  O2 Device (S)  HFNC (Changed to 4 L [place], pt normally wears 3 L @ home.)   Resumed regular nasal cannula, goal = 3 L [place] baseline.

## 2023-07-07 NOTE — Progress Notes (Signed)
  Progress Note   Patient: [name] [name] FMW:[id number] DOB: 08/17/1945 DOA: 07/05/2023     2 DOS: the patient was seen and examined on 07/07/2023   Brief hospital course: 78 y.o. 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 - CXR with persistent infiltrate but slightly improved - Cont IV Solu-Medrol  40 mg every 12 hours - On daily IV azithromycin  500 mg x 4 days - Continue Breztri  and scheduled DuoNebs  - respiratory viral panel neg - O2 requirement has improved to 4LNC, pt's baseline   # Recent history of multifocal pneumonia - Chest imaging on 6/16 showed multifocal pneumonia, repeat imaging on admission shows mild improvement - Sputum cultures grew stenotrophomonas, patient discharged home on 14 days of Bactrim  - Continue Bactrim , day 6/10   # Hyperglycemia - In the setting of steroid use, no documented history of diabetes - cont SSI as needed   # HFpEF - Last TTE on 06/2022 showed EF 65-70%, G1DD but no valvular abnormalities. - Patient euvolemic on exam - Continue Lasix    # HTN - BP elevated with SBP in the 160s - Continue amlodipine    # CAD # HLD - Continue atorvastatin    # GAD - Continue sertraline  and as needed Xanax    # Rheumatoid arthritis - Continue weekly methotrexate    # Chronic constipation - Reports last bowel movement yesterday, no abdominal pain or  distention - Continue daily Colace and Senokot-S   # Generalized weakness - PT/OT consulted, recs for SNF      Subjective: States breathing much better now. Asking about stopping bipap  Physical Exam: Vitals:   07/07/23 0857 07/07/23 1143 07/07/23 1216 07/07/23 1516  BP: (!) 144/66  125/60   Pulse:   93   Resp:   (!) 21   Temp:   98.3 F (36.8 C)   TempSrc:   Oral   SpO2:  97% 93% 93%  Weight:      Height:       General exam: Conversant, in no acute distress Respiratory system: normal chest rise, clear, no audible wheezing Cardiovascular system: regular rhythm, s1-s2 Gastrointestinal system: Nondistended, nontender, pos BS Central nervous system: No seizures, no tremors Extremities: No cyanosis, no joint deformities Skin: No rashes, no pallor Psychiatry: Affect normal // no auditory hallucinations   Data Reviewed:  Labs reviewed: Na 136, K 4.9, Cr 0.61, WBC 9.6, hgb 13.0, Plts 157  Family Communication: Pt in room, family not at bedside  Disposition: Status is: Inpatient Remains inpatient appropriate because: severity of illness  Planned Discharge Destination: Skilled nursing facility     Author: Garnette Pelt, MD 07/07/2023 4:57 PM  For on call review [email or web address].

## 2023-07-08 DIAGNOSIS — J9621 Acute and chronic respiratory failure with hypoxia: Secondary | ICD-10-CM | POA: Diagnosis not present

## 2023-07-08 DIAGNOSIS — J441 Chronic obstructive pulmonary disease with (acute) exacerbation: Secondary | ICD-10-CM | POA: Diagnosis not present

## 2023-07-08 LAB — COMPREHENSIVE METABOLIC PANEL WITH GFR
ALT: 20 U/L (ref 0–44)
AST: 14 U/L — ABNORMAL LOW (ref 15–41)
Albumin: 3.3 g/dL — ABNORMAL LOW (ref 3.5–5.0)
Alkaline Phosphatase: 103 U/L (ref 38–126)
Anion gap: 10 (ref 5–15)
BUN: 17 mg/dL (ref 8–23)
CO2: 30 mmol/L (ref 22–32)
Calcium: 9.1 mg/dL (ref 8.9–10.3)
Chloride: 95 mmol/L — ABNORMAL LOW (ref 98–111)
Creatinine, Ser: 0.69 mg/dL (ref 0.61–1.24)
GFR, Estimated: >60 mL/min
Glucose, Bld: 137 mg/dL — ABNORMAL HIGH (ref 70–99)
Potassium: 4.4 mmol/L (ref 3.5–5.1)
Sodium: 135 mmol/L (ref 135–145)
Total Bilirubin: 0.7 mg/dL (ref 0.0–1.2)
Total Protein: 6.1 g/dL — ABNORMAL LOW (ref 6.5–8.1)

## 2023-07-08 LAB — CBC
HCT: 42.8 % (ref 39.0–52.0)
Hemoglobin: 13.7 g/dL (ref 13.0–17.0)
MCH: 34.6 pg — ABNORMAL HIGH (ref 26.0–34.0)
MCHC: 32 g/dL (ref 30.0–36.0)
MCV: 108.1 fL — ABNORMAL HIGH (ref 80.0–100.0)
Platelets: 180 K/uL (ref 150–400)
RBC: 3.96 MIL/uL — ABNORMAL LOW (ref 4.22–5.81)
RDW: 15.5 % (ref 11.5–15.5)
WBC: 10.9 K/uL — ABNORMAL HIGH (ref 4.0–10.5)
nRBC: 0 % (ref 0.0–0.2)

## 2023-07-08 LAB — GLUCOSE, CAPILLARY
Glucose-Capillary: 113 mg/dL — ABNORMAL HIGH (ref 70–99)
Glucose-Capillary: 115 mg/dL — ABNORMAL HIGH (ref 70–99)
Glucose-Capillary: 140 mg/dL — ABNORMAL HIGH (ref 70–99)
Glucose-Capillary: 154 mg/dL — ABNORMAL HIGH (ref 70–99)
Glucose-Capillary: 106 mg/dL — ABNORMAL HIGH (ref 70–99)

## 2023-07-08 MED ORDER — PANTOPRAZOLE SODIUM 40 MG PO TBEC
40.0000 mg | DELAYED_RELEASE_TABLET | Freq: Every day | ORAL | Status: DC
  Administered 2023-07-08 – 2023-07-15 (×8): 40 mg via ORAL
  Filled 2023-07-08 (×8): qty 1

## 2023-07-08 MED ORDER — IPRATROPIUM-ALBUTEROL 0.5-2.5 (3) MG/3ML IN SOLN: 3.0000 mL | RESPIRATORY_TRACT | Status: DC | PRN

## 2023-07-08 MED ORDER — SERTRALINE HCL 100 MG PO TABS
100.0000 mg | ORAL_TABLET | Freq: Every day | ORAL | Status: DC
  Administered 2023-07-08 – 2023-07-15 (×8): 100 mg via ORAL
  Filled 2023-07-08 (×9): qty 1

## 2023-07-08 NOTE — Telephone Encounter (Signed)
 Pt was hospitalized.

## 2023-07-08 NOTE — NC FL2 (Signed)
 [place]  MEDICAID FL2 LEVEL OF CARE FORM     IDENTIFICATION  Patient Name: [name] [name] [facility] Birthdate: [birth year] Sex: male Admission Date (Current Location): 07/05/2023  [facility] and [place] Number:  Producer, television/film/video and Address:  [facility],  [address], [place] [address]      Provider Number: [id number]  Attending Physician Name and Address:  Cindy Garnette POUR, MD  Relative Name and Phone Number:  Ole Maisel(dtr)[phone]    Current Level of Care: Hospital Recommended Level of Care: Skilled Nursing Facility Prior Approval Number:    Date Approved/Denied:   PASRR Number: [id number]  Discharge Plan: SNF    Current Diagnoses: Patient Active Problem List   Diagnosis Date Noted   COPD exacerbation (HCC) 07/05/2023   Hyponatremia 06/25/2023   Multifocal pneumonia 06/24/2023   COPD with acute exacerbation (HCC) 03/29/2023   Acute on chronic diastolic CHF (congestive heart failure) (HCC) 03/29/2023   Pleural effusion 03/23/2023   Rib fracture 03/22/2023   Abnormal CT lung screening 11/12/2022   Rheumatoid arthritis (HCC) 11/12/2022   Constipation 07/08/2022   GERD without esophagitis 07/08/2022   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   Shortness of breath 07/06/2022   Acute on chronic respiratory failure with hypoxia (HCC) 07/04/2022   Alcohol abuse 07/04/2022   Macrocytosis 07/04/2022   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   Swelling of lower extremity 06/24/2020   Former smoker 09/04/2019   Coronary artery calcification seen on CT scan 08/12/2019   Essential hypertension 08/12/2019   Hyperlipidemia 08/12/2019   Chronic respiratory failure with hypoxia (HCC) 03/30/2019   COPD (chronic obstructive pulmonary disease) (HCC) 03/18/2018    Orientation RESPIRATION BLADDER Height & Weight     Self, Time, Situation, Place  O2 Continent Weight: 86.9 kg Height:  5' 11 (180.3 cm)  BEHAVIORAL  SYMPTOMS/MOOD NEUROLOGICAL BOWEL NUTRITION STATUS      Continent Diet (Regular)  AMBULATORY STATUS COMMUNICATION OF NEEDS Skin   Limited Assist Verbally Normal                       Personal Care Assistance Level of Assistance  Bathing, Feeding, Dressing Bathing Assistance: Limited assistance         Functional Limitations Info  Sight, Hearing, Speech Sight Info: Adequate Hearing Info: Adequate Speech Info: Adequate    SPECIAL CARE FACTORS FREQUENCY  PT (By licensed PT), OT (By licensed OT)     PT Frequency: 5x week OT Frequency: 5x week            Contractures Contractures Info: Not present    Additional Factors Info  Code Status, Allergies Code Status Info: Full Allergies Info: Fluticasone -salmeterol           Current Medications (07/08/2023):  This is the current hospital active medication list Current Facility-Administered Medications  Medication Dose Route Frequency Provider Last Rate Last Admin   acetaminophen  (TYLENOL ) tablet 650 mg  650 mg Oral Q6H PRN Amponsah, Prosper M, MD   650 mg at 07/07/23 0023   Or   acetaminophen  (TYLENOL ) suppository 650 mg  650 mg Rectal Q6H PRN Lou Claretta HERO, MD       ALPRAZolam  (XANAX ) tablet 0.25 mg  0.25 mg Oral TID PRN Amponsah, Prosper M, MD   0.25 mg at 07/08/23 1023   amLODipine  (NORVASC ) tablet 5 mg  5 mg Oral Daily Amponsah, Prosper M, MD   5 mg at 07/07/23 0857   atorvastatin  (LIPITOR) tablet  20 mg  20 mg Oral Daily Amponsah, Prosper M, MD   20 mg at 07/08/23 0854   azithromycin  (ZITHROMAX ) 500 mg in sodium chloride  0.9 % 250 mL IVPB  500 mg Intravenous Q24H Amponsah, Prosper M, MD 250 mL/hr at 07/07/23 2142 500 mg at 07/07/23 2142   budesonide -glycopyrrolate -formoterol  (BREZTRI ) 160-9-4.8 MCG/ACT inhaler 2 puff  2 puff Inhalation BID Lou Claretta HERO, MD   2 puff at 07/08/23 0744   docusate sodium  (COLACE) capsule 100 mg  100 mg Oral BID Amponsah, Prosper M, MD   100 mg at 07/08/23 0854   enoxaparin   (LOVENOX ) injection 40 mg  40 mg Subcutaneous Q24H Amponsah, Prosper M, MD   40 mg at 07/07/23 2128   furosemide  (LASIX ) tablet 40 mg  40 mg Oral Daily Amponsah, Prosper M, MD   40 mg at 07/08/23 0854   insulin aspart (novoLOG) injection 0-15 Units  0-15 Units Subcutaneous TID WC Amponsah, Prosper M, MD   2 Units at 07/07/23 0841   ipratropium-albuterol  (DUONEB) 0.5-2.5 (3) MG/3ML nebulizer solution 3 mL  3 mL Nebulization QID Lou Claretta HERO, MD   3 mL at 07/08/23 1128   melatonin tablet 3 mg  3 mg Oral QHS PRN Amponsah, Prosper M, MD   3 mg at 07/07/23 2134   methylPREDNISolone  sodium succinate (SOLU-MEDROL ) 40 mg/mL injection 40 mg  40 mg Intravenous Q12H Amponsah, Prosper M, MD   40 mg at 07/08/23 9147   ondansetron  (ZOFRAN ) tablet 4 mg  4 mg Oral Q6H PRN Amponsah, Prosper M, MD       Or   ondansetron  (ZOFRAN ) injection 4 mg  4 mg Intravenous Q6H PRN Amponsah, Prosper M, MD       pantoprazole  (PROTONIX ) EC tablet 40 mg  40 mg Oral Daily Cindy Garnette POUR, MD   40 mg at 07/08/23 1010   senna-docusate (Senokot-S) tablet 1 tablet  1 tablet Oral QHS PRN Lou Claretta HERO, MD       senna-docusate (Senokot-S) tablet 2 tablet  2 tablet Oral QHS Lou Claretta HERO, MD   2 tablet at 07/07/23 2128   sertraline  (ZOLOFT ) tablet 100 mg  100 mg Oral Daily Cindy Garnette POUR, MD   100 mg at 07/08/23 1011   sulfamethoxazole -trimethoprim  (BACTRIM  DS) 800-160 MG per tablet 1 tablet  1 tablet Oral Q12H Lou Claretta HERO, MD   1 tablet at 07/08/23 9145     Discharge Medications: Please see discharge summary for a list of discharge medications.  Relevant Imaging Results:  Relevant Lab Results:   Additional Information SS#[id number]  Mirl Hillery, [place], [place]

## 2023-07-08 NOTE — Evaluation (Signed)
 Occupational Therapy Evaluation Patient Details Name: [name] [name] MRN: [id number] DOB: 1945-03-19 Today's Date: 07/08/2023   History of Present Illness   Pt is a 78 y.o. male admitted for COPD exacerbation and acute on chronic respiratory failure with hypoxia.  Patient was recently hospitalized for multifocal pneumonia and COPD exacerbation from 6/16 to 6/23 and discharged to rehab facility. EFY:dzczmz COPD, chronic hypoxic respiratory failure on 4 L of oxygen  at baseline, rheumatoid arthritis, heart failure with preserved ejection fraction, CAD, hypertension, hyperlipidemia, generalized anxiety disorder     Clinical Impressions PTA, patient was receiving rehab and progressing with PT/OT. Readmitted for COPD exacerbation. Currently, patient presents with deficits outlined below (see OT problem list for details) most significantly decreased activity tolerance and balance and generalized muscle weakness limiting BADL's and functional mobility. Patient highly motivated to progress and requires ongoign skilled OT while in Acute hospital setting to progress function. Patient will benefit from continued inpatient follow up therapy, <3 hours/day.       If plan is discharge home, recommend the following:   A lot of help with bathing/dressing/bathroom;Assistance with cooking/housework;Direct supervision/assist for medications management;Assist for transportation;Help with stairs or ramp for entrance;Direct supervision/assist for financial management;A little help with walking and/or transfers     Functional Status Assessment   Patient has had a recent decline in their functional status and demonstrates the ability to make significant improvements in function in a reasonable and predictable amount of time.     Equipment Recommendations   None recommended by OT      Precautions/Restrictions   Precautions Precautions: Fall Precaution/Restrictions Comments: monitor sats, currently  on 4L at rest and 5L with activity O2 via [place] Restrictions Weight Bearing Restrictions Per Provider Order: No     Mobility Bed Mobility Overal bed mobility: Needs Assistance Bed Mobility: Supine to Sit     Supine to sit: Contact guard, Supervision     General bed mobility comments: min cues    Transfers Overall transfer level: Needs assistance Equipment used: Rolling walker (2 wheels) Transfers: Sit to/from Stand, Bed to chair/wheelchair/BSC Sit to Stand: Contact guard assist     Step pivot transfers: Contact guard assist     General transfer comment: min cues for hand placement and breathing integration for short stepping to chair with RW and brief ADL standing      Balance Overall balance assessment: Needs assistance Sitting-balance support: No upper extremity supported, Feet supported Sitting balance-Leahy Scale: Good     Standing balance support: No upper extremity supported Standing balance-Leahy Scale: Fair                             ADL either performed or assessed with clinical judgement   ADL Overall ADL's : Needs assistance/impaired Eating/Feeding: Modified independent;Sitting   Grooming: Sitting;Modified independent   Upper Body Bathing: Sitting;Contact guard assist   Lower Body Bathing: Sitting/lateral leans;Minimal assistance Lower Body Bathing Details (indicate cue type and reason): able to reach feet but very fatiguing for SOB to attempt task. Upper Body Dressing : Contact guard assist;Sitting   Lower Body Dressing: Sitting/lateral leans;Minimal assistance   Toilet Transfer: Rolling walker (2 wheels);Minimal assistance;BSC/3in1 Statistician Details (indicate cue type and reason): min cues for breathing integration . Toileting- Clothing Manipulation and Hygiene: Sitting/lateral lean;Minimal assistance Toileting - Clothing Manipulation Details (indicate cue type and reason): urinal use with dist S     Functional mobility during  ADLs: Contact guard assist;Rolling walker (2  wheels) General ADL Comments: overall activity tolerance limited     Vision Patient Visual Report: No change from baseline Vision Assessment?: No apparent visual deficits            Pertinent Vitals/Pain Pain Assessment Pain Assessment: No/denies pain Faces Pain Scale: Hurts a little bit Pain Location: abdomen Pain Descriptors / Indicators: Grimacing, Discomfort     Extremity/Trunk Assessment Upper Extremity Assessment Upper Extremity Assessment: Right hand dominant;Generalized weakness   Lower Extremity Assessment Lower Extremity Assessment: Generalized weakness   Cervical / Trunk Assessment Cervical / Trunk Assessment: Kyphotic   Communication Communication Communication: No apparent difficulties   Cognition Arousal: Alert Behavior During Therapy: WFL for tasks assessed/performed Cognition: No apparent impairments                               Following commands: Intact       Cueing  General Comments   Cueing Techniques: Verbal cues  4 ltrs reg O2 via [place] with sats remaining >92% during session   Exercises Exercises: Other exercises (foam grasp ball B hands with breathing integ)        Home Living Family/patient expects to be discharged to:: Skilled nursing facility Living Arrangements: Children Available Help at Discharge: Family;Available PRN/intermittently Type of Home: Apartment Home Access: Level entry     Home Layout: One level     Bathroom Shower/Tub: Chief Strategy Officer: Handicapped height     Home Equipment: Grab bars - toilet;Grab bars - tub/shower;Cane - single Librarian, academic (2 wheels);Rollator (4 wheels);Shower seat;Hand held shower head;Adaptive equipment;BSC/3in1 Adaptive Equipment: Reacher Additional Comments: on 3L home O2; daughter has been staying with family but she works, children are local and can help at times      Prior Functioning/Environment  Prior Level of Function : Independent/Modified Independent;Driving             Mobility Comments: Uses SPC primarily, has not been driving recently ADLs Comments: 2x/week bathes, otherwise sponge bathes, dresses self. house keeper 1x/month, splits other cleaning with daughter    OT Problem List: Impaired balance (sitting and/or standing);Decreased knowledge of precautions;Decreased safety awareness;Decreased activity tolerance;Decreased knowledge of use of DME or AE   OT Treatment/Interventions: Self-care/ADL training;DME and/or AE instruction;Therapeutic activities;Balance training;Energy conservation;Patient/family education      OT Goals(Current goals can be found in the care plan section)   Acute Rehab OT Goals Patient Stated Goal: to give me time to recover my stamina OT Goal Formulation: With patient Time For Goal Achievement: 07/10/23 Potential to Achieve Goals: Fair   OT Frequency:  Min 2X/week    Co-evaluation   Reason for Co-Treatment: For patient/therapist safety;To address functional/ADL transfers PT goals addressed during session: Mobility/safety with mobility OT goals addressed during session: ADL's and self-care      AM-PAC OT 6 Clicks Daily Activity     Outcome Measure Help from another person eating meals?: A Little Help from another person taking care of personal grooming?: A Little Help from another person toileting, which includes using toliet, bedpan, or urinal?: A Lot Help from another person bathing (including washing, rinsing, drying)?: A Lot Help from another person to put on and taking off regular upper body clothing?: A Little Help from another person to put on and taking off regular lower body clothing?: A Lot 6 Click Score: 15   End of Session Equipment Utilized During Treatment: Rolling walker (2 wheels) Nurse Communication: Mobility status;Other (comment) (  vitals during session)  Activity Tolerance: Patient limited by fatigue Patient  left: in chair;with call bell/phone within reach;with chair alarm set  OT Visit Diagnosis: Unsteadiness on feet (R26.81);Other abnormalities of gait and mobility (R26.89);Muscle weakness (generalized) (M62.81)                Time: 9148-9080 OT Time Calculation (min): 28 min Charges:  OT General Charges $OT Visit: 1 Visit OT Evaluation $OT Eval Low Complexity: 1 Low OT Treatments $Self Care/Home Management : 8-22 mins Griffey Nicasio OT/L Acute Rehabilitation Department  [phone]  07/08/2023, 10:42 AM

## 2023-07-08 NOTE — TOC Progression Note (Signed)
 Transition of Care [facility]) - Progression Note    Patient Details  Name: RAYSHAWN MANEY MRN: [id number] Date of Birth: 12/11/1945  Transition of Care [facility]) CM/SW Contact  Alvie Speltz, Nathanel, RN Phone Number: 07/08/2023, 1:15 PM  Clinical Narrative:   Faxed out for ST SNF-noted does not want to return back to [facility]. Await bed offers prior getting auth from H[facility]. Left vm w/[name](dtr) await call back.    Expected Discharge Plan: Skilled Nursing Facility Barriers to Discharge: Continued Medical Work up  Expected Discharge Plan and Services   Discharge Planning Services: CM Consult   Living arrangements for the past 2 months: Skilled Nursing Facility                                       Social Determinants of Health (SDOH) Interventions SDOH Screenings   Food Insecurity: No Food Insecurity (07/06/2023)  Housing: Low Risk  (07/06/2023)  Transportation Needs: No Transportation Needs (07/06/2023)  Utilities: Not At Risk (07/06/2023)  Depression (PHQ2-9): Low Risk  (03/09/2019)  Social Connections: Socially Isolated (07/06/2023)  Tobacco Use: Medium Risk (07/07/2023)    Readmission Risk Interventions    07/06/2023    4:46 PM 06/26/2023    2:14 PM 03/31/2023    3:55 PM  Readmission Risk Prevention Plan  Transportation Screening Complete Complete Complete  PCP or Specialist Appt within 3-5 Days Complete  Complete  HRI or Home Care Consult Complete  Complete  Social Work Consult for Recovery Care Planning/Counseling Complete  Complete  Palliative Care Screening Not Applicable  Not Applicable  Medication Review Oceanographer) Complete Complete Complete  PCP or Specialist appointment within 3-5 days of discharge  Complete   HRI or Home Care Consult  Complete   SW Recovery Care/Counseling Consult  Complete   Palliative Care Screening  Not Applicable   Skilled Nursing Facility  Complete

## 2023-07-08 NOTE — Progress Notes (Deleted)
 PATIENT DISCHARGE NOTE  Discharge instructions and report was reviewed with the nurse that will be assuming care for the patient at [facility]. The patient is alert and oriented at baseline at this time of discharge. At this time, the patient doesn't appear nor has any complaint of complications or distress. PTAR scheduled to transport patient to facility via ambulance.  IV removed and gauze/tape dressing applied to the site. Tele box was also removed and returned to 4E Nurses Station.

## 2023-07-08 NOTE — Progress Notes (Signed)
  Progress Note   Patient: [name] [name] FMW:[id number] DOB: 02/04/1945 DOA: 07/05/2023     3 DOS: the patient was seen and examined on 07/08/2023   Brief hospital course: 78 y.o. 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 - CXR with persistent infiltrate but slightly improved - Cont IV Solu-Medrol  40 mg every 12 hours, likely begin wean tomorrow - On daily IV azithromycin  500 mg x 4 days - Continue Breztri  and scheduled DuoNebs  - respiratory viral panel neg - O2 requirement has improved to baseline 3LNC   # Recent history of multifocal pneumonia - Chest imaging on 6/16 showed multifocal pneumonia, repeat imaging on admission shows mild improvement - Sputum cultures grew stenotrophomonas, patient discharged home on 14 days of Bactrim  - Continue Bactrim , day 7/10   # Hyperglycemia - In the setting of steroid use, no documented history of diabetes - cont SSI as needed   # HFpEF - Last TTE on 06/2022 showed EF 65-70%, G1DD but no valvular abnormalities. - Patient euvolemic on exam - Continue Lasix    # HTN - BP elevated with SBP in the 160s - Continue amlodipine    # CAD # HLD - Continue atorvastatin    # GAD - Continue sertraline  and as needed Xanax    # Rheumatoid arthritis - Continue weekly methotrexate    # Chronic constipation - Reports last bowel movement yesterday, no  abdominal pain or distention - Continue daily Colace and Senokot-S   # Generalized weakness - PT/OT consulted, recs for SNF      Subjective: Weaned to baseline 3LNC. When seen, pt still complaining of sob  Physical Exam: Vitals:   07/08/23 0854 07/08/23 1128 07/08/23 1248 07/08/23 1514  BP: (!) 114/56  (!) 140/66   Pulse:   92   Resp:   20   Temp:   98.7 F (37.1 C)   TempSrc:   Oral   SpO2:  97% 91% 97%  Weight:      Height:       General exam: Awake, laying in bed, in nad Respiratory system: Normal respiratory effort, decreased BS Cardiovascular system: regular rate, s1, s2 Gastrointestinal system: Soft, nondistended, positive BS Central nervous system: CN2-12 grossly intact, strength intact Extremities: Perfused, no clubbing Skin: Normal skin turgor, no notable skin lesions seen Psychiatry: Mood normal // no visual hallucinations   Data Reviewed:  Labs reviewed: Na 135, K 4.4, Cr 0.69, WBC 10.9, hgb 13.7, Plts 180  Family Communication: Pt in room, family not at bedside  Disposition: Status is: Inpatient Remains inpatient appropriate because: severity of illness  Planned Discharge Destination: Skilled nursing facility     Author: Garnette Pelt, MD 07/08/2023 5:14 PM  For on call review [email or web address].

## 2023-07-08 NOTE — NC FL2 (Signed)
 [place]  MEDICAID FL2 LEVEL OF CARE FORM     IDENTIFICATION  Patient Name: [name] [name] [facility] Birthdate: 04-Jul-1945 Sex: male Admission Date (Current Location): 07/05/2023  [facility] and [place] Number:  Producer, television/film/video and Address:  [facility],  [address], [place] [address]      Provider Number: [id number]  Attending Physician Name and Address:  Cindy Garnette POUR, MD  Relative Name and Phone Number:  Ole Popper(dtr)[phone]    Current Level of Care: Hospital Recommended Level of Care: Skilled Nursing Facility Prior Approval Number:    Date Approved/Denied:   PASRR Number: [id number]  Discharge Plan: SNF    Current Diagnoses: Patient Active Problem List   Diagnosis Date Noted   COPD exacerbation (HCC) 07/05/2023   Hyponatremia 06/25/2023   Multifocal pneumonia 06/24/2023   COPD with acute exacerbation (HCC) 03/29/2023   Acute on chronic diastolic CHF (congestive heart failure) (HCC) 03/29/2023   Pleural effusion 03/23/2023   Rib fracture 03/22/2023   Abnormal CT lung screening 11/12/2022   Rheumatoid arthritis (HCC) 11/12/2022   Constipation 07/08/2022   GERD without esophagitis 07/08/2022   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   Shortness of breath 07/06/2022   Acute on chronic respiratory failure with hypoxia (HCC) 07/04/2022   Alcohol abuse 07/04/2022   Macrocytosis 07/04/2022   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   Swelling of lower extremity 06/24/2020   Former smoker 09/04/2019   Coronary artery calcification seen on CT scan 08/12/2019   Essential hypertension 08/12/2019   Hyperlipidemia 08/12/2019   Chronic respiratory failure with hypoxia (HCC) 03/30/2019   COPD (chronic obstructive pulmonary disease) (HCC) 03/18/2018    Orientation RESPIRATION BLADDER Height & Weight     Self, Time, Situation, Place  Normal Continent Weight: 86.9 kg Height:  5' 11 (180.3 cm)  BEHAVIORAL  SYMPTOMS/MOOD NEUROLOGICAL BOWEL NUTRITION STATUS      Continent Diet (Regular)  AMBULATORY STATUS COMMUNICATION OF NEEDS Skin   Limited Assist Verbally Normal                       Personal Care Assistance Level of Assistance  Bathing, Feeding, Dressing Bathing Assistance: Limited assistance         Functional Limitations Info  Sight, Hearing, Speech Sight Info: Adequate Hearing Info: Adequate Speech Info: Adequate    SPECIAL CARE FACTORS FREQUENCY  PT (By licensed PT), OT (By licensed OT)     PT Frequency: 5x week OT Frequency: 5x week            Contractures Contractures Info: Not present    Additional Factors Info  Code Status, Allergies Code Status Info: Full Allergies Info: Fluticasone -salmeterol           Current Medications (07/08/2023):  This is the current hospital active medication list Current Facility-Administered Medications  Medication Dose Route Frequency Provider Last Rate Last Admin   acetaminophen  (TYLENOL ) tablet 650 mg  650 mg Oral Q6H PRN Amponsah, Prosper M, MD   650 mg at 07/07/23 0023   Or   acetaminophen  (TYLENOL ) suppository 650 mg  650 mg Rectal Q6H PRN Lou Claretta HERO, MD       ALPRAZolam  (XANAX ) tablet 0.25 mg  0.25 mg Oral TID PRN Amponsah, Prosper M, MD   0.25 mg at 07/08/23 1023   amLODipine  (NORVASC ) tablet 5 mg  5 mg Oral Daily Amponsah, Prosper M, MD   5 mg at 07/07/23 0857   atorvastatin  (LIPITOR) tablet  20 mg  20 mg Oral Daily Amponsah, Prosper M, MD   20 mg at 07/08/23 9145   azithromycin  (ZITHROMAX ) 500 mg in sodium chloride  0.9 % 250 mL IVPB  500 mg Intravenous Q24H Amponsah, Prosper M, MD 250 mL/hr at 07/07/23 2142 500 mg at 07/07/23 2142   budesonide -glycopyrrolate -formoterol  (BREZTRI ) 160-9-4.8 MCG/ACT inhaler 2 puff  2 puff Inhalation BID Lou Claretta HERO, MD   2 puff at 07/08/23 0744   docusate sodium  (COLACE) capsule 100 mg  100 mg Oral BID Amponsah, Prosper M, MD   100 mg at 07/08/23 0854   enoxaparin   (LOVENOX ) injection 40 mg  40 mg Subcutaneous Q24H Amponsah, Prosper M, MD   40 mg at 07/07/23 2128   furosemide  (LASIX ) tablet 40 mg  40 mg Oral Daily Amponsah, Prosper M, MD   40 mg at 07/08/23 0854   insulin aspart (novoLOG) injection 0-15 Units  0-15 Units Subcutaneous TID WC Amponsah, Prosper M, MD   2 Units at 07/07/23 9158   ipratropium-albuterol  (DUONEB) 0.5-2.5 (3) MG/3ML nebulizer solution 3 mL  3 mL Nebulization QID Lou Claretta HERO, MD   3 mL at 07/08/23 1128   melatonin tablet 3 mg  3 mg Oral QHS PRN Amponsah, Prosper M, MD   3 mg at 07/07/23 2134   methylPREDNISolone  sodium succinate (SOLU-MEDROL ) 40 mg/mL injection 40 mg  40 mg Intravenous Q12H Amponsah, Prosper M, MD   40 mg at 07/08/23 9147   ondansetron  (ZOFRAN ) tablet 4 mg  4 mg Oral Q6H PRN Amponsah, Prosper M, MD       Or   ondansetron  (ZOFRAN ) injection 4 mg  4 mg Intravenous Q6H PRN Amponsah, Prosper M, MD       pantoprazole  (PROTONIX ) EC tablet 40 mg  40 mg Oral Daily Cindy Garnette POUR, MD   40 mg at 07/08/23 1010   senna-docusate (Senokot-S) tablet 1 tablet  1 tablet Oral QHS PRN Lou Claretta HERO, MD       senna-docusate (Senokot-S) tablet 2 tablet  2 tablet Oral QHS Lou Claretta HERO, MD   2 tablet at 07/07/23 2128   sertraline  (ZOLOFT ) tablet 100 mg  100 mg Oral Daily Cindy Garnette POUR, MD   100 mg at 07/08/23 1011   sulfamethoxazole -trimethoprim  (BACTRIM  DS) 800-160 MG per tablet 1 tablet  1 tablet Oral Q12H Lou Claretta HERO, MD   1 tablet at 07/08/23 9145     Discharge Medications: Please see discharge summary for a list of discharge medications.  Relevant Imaging Results:  Relevant Lab Results:   Additional Information SS#[id number]  [name] [name], [name] [name], [place]

## 2023-07-08 NOTE — Plan of Care (Signed)

## 2023-07-09 DIAGNOSIS — J441 Chronic obstructive pulmonary disease with (acute) exacerbation: Secondary | ICD-10-CM | POA: Diagnosis not present

## 2023-07-09 DIAGNOSIS — J9621 Acute and chronic respiratory failure with hypoxia: Secondary | ICD-10-CM | POA: Diagnosis not present

## 2023-07-09 LAB — COMPREHENSIVE METABOLIC PANEL WITH GFR
ALT: 19 U/L (ref 0–44)
AST: 17 U/L (ref 15–41)
Albumin: 3.2 g/dL — ABNORMAL LOW (ref 3.5–5.0)
Alkaline Phosphatase: 103 U/L (ref 38–126)
Anion gap: 11 (ref 5–15)
BUN: 16 mg/dL (ref 8–23)
CO2: 29 mmol/L (ref 22–32)
Calcium: 9.4 mg/dL (ref 8.9–10.3)
Chloride: 96 mmol/L — ABNORMAL LOW (ref 98–111)
Creatinine, Ser: 0.65 mg/dL (ref 0.61–1.24)
GFR, Estimated: >60 mL/min (ref >60)
Glucose, Bld: 137 mg/dL — ABNORMAL HIGH (ref 70–99)
Potassium: 4.3 mmol/L (ref 3.5–5.1)
Sodium: 136 mmol/L (ref 135–145)
Total Bilirubin: 0.7 mg/dL (ref 0.0–1.2)
Total Protein: 6.1 g/dL — ABNORMAL LOW (ref 6.5–8.1)

## 2023-07-09 LAB — CBC
HCT: 43.3 % (ref 39.0–52.0)
Hemoglobin: 14.1 g/dL (ref 13.0–17.0)
MCH: 34.9 pg — ABNORMAL HIGH (ref 26.0–34.0)
MCHC: 32.6 g/dL (ref 30.0–36.0)
MCV: 107.2 fL — ABNORMAL HIGH (ref 80.0–100.0)
Platelets: 187 10*3/uL (ref 150–400)
RBC: 4.04 MIL/uL — ABNORMAL LOW (ref 4.22–5.81)
RDW: 15.7 % — ABNORMAL HIGH (ref 11.5–15.5)
WBC: 10.5 10*3/uL (ref 4.0–10.5)
nRBC: 0 % (ref 0.0–0.2)

## 2023-07-09 LAB — GLUCOSE, CAPILLARY
Glucose-Capillary: 99 mg/dL (ref 70–99)
Glucose-Capillary: 109 mg/dL — ABNORMAL HIGH (ref 70–99)
Glucose-Capillary: 123 mg/dL — ABNORMAL HIGH (ref 70–99)
Glucose-Capillary: 121 mg/dL — ABNORMAL HIGH (ref 70–99)

## 2023-07-09 MED ORDER — CARBAMIDE PEROXIDE 6.5 % OT SOLN
5.0000 [drp] | Freq: Two times a day (BID) | OTIC | Status: DC
  Administered 2023-07-09 – 2023-07-14 (×9): 5 [drp] via OTIC
  Filled 2023-07-09: qty 15

## 2023-07-09 MED ORDER — NEOMYCIN-POLYMYXIN-HC 3.5-10000-1 OT SOLN
3.0000 [drp] | Freq: Four times a day (QID) | OTIC | Status: DC
  Administered 2023-07-09 – 2023-07-15 (×19): 3 [drp] via OTIC
  Filled 2023-07-09: qty 10

## 2023-07-09 MED ORDER — POLYETHYLENE GLYCOL 3350 17 G PO PACK
17.0000 g | PACK | Freq: Two times a day (BID) | ORAL | Status: DC
  Administered 2023-07-09 – 2023-07-14 (×9): 17 g via ORAL
  Filled 2023-07-09 (×11): qty 1

## 2023-07-09 NOTE — TOC Progression Note (Signed)
 Transition of Care [facility]) - Progression Note    Patient Details  Name: [name] [name] MRN: [id number] Date of Birth: 12/09/1945  Transition of [facility]) CM/SW Contact  Salvador Coupe, Nathanel, RN Phone Number: 07/09/2023, 11:19 AM  Clinical Narrative:   Bed offers given await choice prior auth. GORMAN mana Provider                           Stars Request Status Services Address Phone Fax Patient Preferred  [facility]          1 Accepted -- [address], [place] [address] [phone] --  [facility]                                   2 Accepted -- [address], [place] [address] [phone] --  [facility]                                1 Accepted -- [address] [place] [address] [phone] --  [facility] 1 Accepted -- [address], [place] [address] [phone] --  [facility] Preferred SNF  Accepted -- [address] [place] [address] [phone] --  [facility] Preferred [facility]                1 Accepted -- [address] [place] [address] [phone] --  [facility] Preferred SNF  2 Accepted -- [address], [place] [address] [phone] --              Expected Discharge Plan: Skilled Nursing Facility Barriers to Discharge: Continued Medical Work up  Expected Discharge Plan and Services   Discharge Planning Services: CM Consult   Living arrangements for the past 2 months: Skilled Nursing Facility                                       Social Determinants of Health (SDOH) Interventions SDOH Screenings   Food Insecurity: No Food Insecurity (07/06/2023)  Housing: Low Risk  (07/06/2023)  Transportation Needs: No Transportation Needs (07/06/2023)  Utilities: Not At Risk (07/06/2023)  Depression (PHQ2-9): Low Risk   (03/09/2019)  Social Connections: Socially Isolated (07/06/2023)  Tobacco Use: Medium Risk (07/07/2023)    Readmission Risk Interventions    07/06/2023    4:46 PM 06/26/2023    2:14 PM 03/31/2023    3:55 PM  Readmission Risk Prevention Plan  Transportation Screening Complete Complete Complete  PCP or Specialist Appt within 3-5 Days Complete  Complete  HRI or Home Care Consult Complete  Complete  Social Work Consult for Recovery Care Planning/Counseling Complete  Complete  Palliative Care Screening Not Applicable  Not Applicable  Medication Review Oceanographer) Complete Complete Complete  PCP or Specialist appointment within 3-5 days of discharge  Complete   HRI or Home Care Consult  Complete   SW Recovery Care/Counseling Consult  Complete   Palliative Care Screening  Not Applicable   Skilled Nursing Facility  Complete

## 2023-07-09 NOTE — Progress Notes (Signed)
  Progress Note   Patient: [name] [name] FMW:[id number] DOB: 06-13-1945 DOA: 07/05/2023     4 DOS: the patient was seen and examined on 07/09/2023   Brief hospital course: 78 y.o. 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 - CXR with persistent infiltrate but slightly improved - Cont IV Solu-Medrol  40 mg every 12 hours, will wean to solumedrol 40mg  once daily starting 7/2 - completed azithromycin  - Continue Breztri  and scheduled DuoNebs  - respiratory viral panel neg - O2 requirement has improved to baseline 3LNC, however pt still has subjective sob   # Recent history of multifocal pneumonia - Chest imaging on 6/16 showed multifocal pneumonia, repeat imaging on admission shows mild improvement - Sputum cultures grew stenotrophomonas, patient was discharged home on 14 days of Bactrim  - Continue Bactrim , currently day 8/10 remaining   # Hyperglycemia - In the setting of steroid use, no documented history of diabetes - cont SSI as needed   # HFpEF - Last TTE on 06/2022 showed EF 65-70%, G1DD but no valvular abnormalities. - Patient euvolemic on exam - Continue Lasix    # HTN - BP elevated with SBP in the 160s - Continue amlodipine    # CAD # HLD - Continue atorvastatin    # GAD - Continue sertraline  and as needed Xanax    # Rheumatoid arthritis - Continue weekly methotrexate    #  Chronic constipation - Reports only small BM recently. States last true BM was almost one week ago - Continue daily Colace and Senokot-S - Will add scheduled BID miralax . If still no results, may need enema   # Generalized weakness - PT/OT consulted, recs for SNF      Subjective: complaining of constipation  Physical Exam: Vitals:   07/09/23 0801 07/09/23 0958 07/09/23 1138 07/09/23 1227  BP:  (!) 144/68  (!) 146/71  Pulse:    (!) 103  Resp:    20  Temp:    97.6 F (36.4 C)  TempSrc:    Oral  SpO2: 97%  97% (!) 89%  Weight:      Height:       General exam: Conversant, in no acute distress Respiratory system: normal chest rise, clear, decreased BS Cardiovascular system: regular rhythm, s1-s2 Gastrointestinal system: Nondistended, nontender, pos BS Central nervous system: No seizures, no tremors Extremities: No cyanosis, no joint deformities Skin: No rashes, no pallor Psychiatry: Affect normal // no auditory hallucinations   Data Reviewed:  Labs reviewed: Na 136, K 4.3, Cr 0.65, Alk phos 103, AST 17, ALT 19, WBC 10.5, Hgb 14.1, Plts 187  Family Communication: Pt in room, family not at bedside  Disposition: Status is: Inpatient Remains inpatient appropriate because: severity of illness  Planned Discharge Destination: Skilled nursing facility     Author: Garnette Pelt, MD 07/09/2023 3:30 PM  For on call review [email or web address].

## 2023-07-09 NOTE — Progress Notes (Signed)
 Physical Therapy Treatment Patient Details Name: [name] [name] MRN: [id number] DOB: 03/13/1945 Today's Date: 07/09/2023   History of Present Illness Pt is a 78 y.o. male admitted for COPD exacerbation and acute on chronic respiratory failure with hypoxia.  Patient was recently hospitalized for multifocal pneumonia and COPD exacerbation from 6/16 to 6/23 and discharged to rehab facility. EFY:dzczmz COPD, chronic hypoxic respiratory failure on 4 L of oxygen  at baseline, rheumatoid arthritis, heart failure with preserved ejection fraction, CAD, hypertension, hyperlipidemia, generalized anxiety disorder    PT Comments  Pt on BSC when PT arrives, has completed BM and agreeable to progressing activity tolerance as able today. He is able to stand x3-5 mins for pericare with RW, but requires rest break following. Pt on 3L during this task and notes inc SOB, able to recover with sitting. Pt repeats task from EOB with walker height adjusted for efficiency of UE WB, standing with weight shifting x2-3 mins before needing rest break, inc to 4L with good recovery in sitting. Third trial on 4L results in 2 mins standing tolerance with desats to 84% on 4L following side steps towards HOB, able to recover to 93%, weaned to 3L with pt maintaining 90% at rest with breathing more normalized. Pt denies further concerns or questions, remains highly motivated. Based on pt PLOF, level of support, and current functional status, pt will benefit from skilled physical therapy 5 times per week at dc for safe progression of functional mobility.     If plan is discharge home, recommend the following: A little help with walking and/or transfers;Assistance with cooking/housework;A little help with bathing/dressing/bathroom;Help with stairs or ramp for entrance;Assist for transportation   Can travel by private vehicle     Yes  Equipment Recommendations  None recommended by PT    Recommendations for Other Services        Precautions / Restrictions Precautions Precautions: Fall Precaution/Restrictions Comments: monitor sats, currently on 3L at rest and 4L with activity O2 via [place] Restrictions Weight Bearing Restrictions Per Provider Order: No     Mobility  Bed Mobility Overal bed mobility: Needs Assistance Bed Mobility: Sit to Supine       Sit to supine: Supervision   General bed mobility comments: none required    Transfers Overall transfer level: Needs assistance Equipment used: Rolling walker (2 wheels) Transfers: Sit to/from Stand, Bed to chair/wheelchair/BSC Sit to Stand: Supervision   Step pivot transfers: Supervision       General transfer comment: cued for pacing and to not exceed capacity, allow for recovery, addressed standing tolerance on 3-4L O2    Ambulation/Gait                   Stairs             Wheelchair Mobility     Tilt Bed    Modified Rankin (Stroke Patients Only)       Balance Overall balance assessment: Needs assistance Sitting-balance support: No upper extremity supported, Feet supported Sitting balance-Leahy Scale: Good     Standing balance support: No upper extremity supported, During functional activity Standing balance-Leahy Scale: Good Standing balance comment: Pt able to stand x3-5 mins before needing to sit and recover, completes 2 additional bouts with fading time in standing                            Communication Communication Communication: No apparent difficulties  Cognition Arousal: Alert Behavior During Therapy:  WFL for tasks assessed/performed   PT - Cognitive impairments: No apparent impairments                         Following commands: Intact      Cueing Cueing Techniques: Verbal cues  Exercises      General Comments        Pertinent Vitals/Pain Pain Assessment Pain Assessment: No/denies pain Pain Intervention(s): Monitored during session    Home Living                           Prior Function            PT Goals (current goals can now be found in the care plan section) Acute Rehab PT Goals Patient Stated Goal: to walk , go home PT Goal Formulation: With patient Time For Goal Achievement: 07/20/23 Potential to Achieve Goals: Good Progress towards PT goals: Progressing toward goals    Frequency    Min 3X/week      PT Plan      Co-evaluation              AM-PAC PT 6 Clicks Mobility   Outcome Measure  Help needed turning from your back to your side while in a flat bed without using bedrails?: None Help needed moving from lying on your back to sitting on the side of a flat bed without using bedrails?: None Help needed moving to and from a bed to a chair (including a wheelchair)?: A Little Help needed standing up from a chair using your arms (e.g., wheelchair or bedside chair)?: A Little Help needed to walk in hospital room?: A Little Help needed climbing 3-5 steps with a railing? : A Lot 6 Click Score: 19    End of Session Equipment Utilized During Treatment: Gait belt;Oxygen  Activity Tolerance: Patient tolerated treatment well Patient left: with call bell/phone within reach;in bed;with bed alarm set Nurse Communication: Mobility status PT Visit Diagnosis: Difficulty in walking, not elsewhere classified (R26.2)     Time: 8579-8556 PT Time Calculation (min) (ACUTE ONLY): 23 min  Charges:    $Therapeutic Activity: 23-37 mins PT General Charges $$ ACUTE PT VISIT: 1 Visit                     [name] Acute Rehabilitation Services Office: [phone] 07/09/2023    Stann DELENA Ohara 07/09/2023, 2:54 PM

## 2023-07-09 NOTE — Plan of Care (Signed)

## 2023-07-10 DIAGNOSIS — J9621 Acute and chronic respiratory failure with hypoxia: Secondary | ICD-10-CM | POA: Diagnosis not present

## 2023-07-10 DIAGNOSIS — R739 Hyperglycemia, unspecified: Secondary | ICD-10-CM

## 2023-07-10 DIAGNOSIS — K5900 Constipation, unspecified: Secondary | ICD-10-CM

## 2023-07-10 DIAGNOSIS — I5032 Chronic diastolic (congestive) heart failure: Secondary | ICD-10-CM

## 2023-07-10 DIAGNOSIS — E782 Mixed hyperlipidemia: Secondary | ICD-10-CM | POA: Diagnosis not present

## 2023-07-10 DIAGNOSIS — J189 Pneumonia, unspecified organism: Secondary | ICD-10-CM

## 2023-07-10 DIAGNOSIS — J441 Chronic obstructive pulmonary disease with (acute) exacerbation: Secondary | ICD-10-CM | POA: Diagnosis not present

## 2023-07-10 DIAGNOSIS — J9611 Chronic respiratory failure with hypoxia: Secondary | ICD-10-CM

## 2023-07-10 DIAGNOSIS — F411 Generalized anxiety disorder: Secondary | ICD-10-CM

## 2023-07-10 DIAGNOSIS — I2510 Atherosclerotic heart disease of native coronary artery without angina pectoris: Secondary | ICD-10-CM

## 2023-07-10 DIAGNOSIS — M069 Rheumatoid arthritis, unspecified: Secondary | ICD-10-CM

## 2023-07-10 DIAGNOSIS — I10 Essential (primary) hypertension: Secondary | ICD-10-CM

## 2023-07-10 LAB — COMPREHENSIVE METABOLIC PANEL WITH GFR
ALT: 23 U/L (ref 0–44)
AST: 17 U/L (ref 15–41)
Albumin: 3.4 g/dL — ABNORMAL LOW (ref 3.5–5.0)
Alkaline Phosphatase: 111 U/L (ref 38–126)
Anion gap: 10 (ref 5–15)
BUN: 14 mg/dL (ref 8–23)
CO2: 31 mmol/L (ref 22–32)
Calcium: 9.3 mg/dL (ref 8.9–10.3)
Chloride: 94 mmol/L — ABNORMAL LOW (ref 98–111)
Creatinine, Ser: 0.82 mg/dL (ref 0.61–1.24)
GFR, Estimated: >60 mL/min (ref >60)
Glucose, Bld: 124 mg/dL — ABNORMAL HIGH (ref 70–99)
Potassium: 4.3 mmol/L (ref 3.5–5.1)
Sodium: 135 mmol/L (ref 135–145)
Total Bilirubin: 0.7 mg/dL (ref 0.0–1.2)
Total Protein: 6.3 g/dL — ABNORMAL LOW (ref 6.5–8.1)

## 2023-07-10 LAB — CBC
HCT: 44.4 % (ref 39.0–52.0)
Hemoglobin: 14.7 g/dL (ref 13.0–17.0)
MCH: 35.1 pg — ABNORMAL HIGH (ref 26.0–34.0)
MCHC: 33.1 g/dL (ref 30.0–36.0)
MCV: 106 fL — ABNORMAL HIGH (ref 80.0–100.0)
Platelets: 182 10*3/uL (ref 150–400)
RBC: 4.19 MIL/uL — ABNORMAL LOW (ref 4.22–5.81)
RDW: 15.7 % — ABNORMAL HIGH (ref 11.5–15.5)
WBC: 9.2 10*3/uL (ref 4.0–10.5)
nRBC: 0 % (ref 0.0–0.2)

## 2023-07-10 LAB — GLUCOSE, CAPILLARY
Glucose-Capillary: 86 mg/dL (ref 70–99)
Glucose-Capillary: 102 mg/dL — ABNORMAL HIGH (ref 70–99)
Glucose-Capillary: 116 mg/dL — ABNORMAL HIGH (ref 70–99)
Glucose-Capillary: 133 mg/dL — ABNORMAL HIGH (ref 70–99)

## 2023-07-10 MED ORDER — METHYLPREDNISOLONE SODIUM SUCC 40 MG IJ SOLR
40.0000 mg | Freq: Every day | INTRAMUSCULAR | Status: DC
  Administered 2023-07-11: 40 mg via INTRAVENOUS
  Filled 2023-07-10: qty 1

## 2023-07-10 NOTE — TOC Progression Note (Addendum)
 Transition of Care [facility]) - Progression Note    Patient Details  Name: [name] [name] MRN: [id number] Date of Birth: 23-Aug-1945  Transition [facility]) CM/SW Contact  Lilinoe Acklin, Nathanel, RN Phone Number: 07/10/2023, 9:35 AM  Clinical Narrative: Patient chose [address]-will contact [employer] to initiate auth.await outcome.   -2:39p-confirmed [employer] rep Tammy received vm & is working on [employer] outcome for insurance auth to [name], [name].    Expected Discharge Plan: Skilled Nursing Facility Barriers to Discharge: Insurance Authorization  Expected Discharge Plan and Services   Discharge Planning Services: CM Consult   Living arrangements for the past 2 months: Skilled Nursing Facility                                       Social Determinants of Health (SDOH) Interventions SDOH Screenings   Food Insecurity: No Food Insecurity (07/06/2023)  Housing: Low Risk  (07/06/2023)  Transportation Needs: No Transportation Needs (07/06/2023)  Utilities: Not At Risk (07/06/2023)  Depression (PHQ2-9): Low Risk  (03/09/2019)  Social Connections: Socially Isolated (07/06/2023)  Tobacco Use: Medium Risk (07/07/2023)    Readmission Risk Interventions    07/06/2023    4:46 PM 06/26/2023    2:14 PM 03/31/2023    3:55 PM  Readmission Risk Prevention Plan  Transportation Screening Complete Complete Complete  PCP or Specialist Appt within 3-5 Days Complete  Complete  HRI or Home Care Consult Complete  Complete  Social Work Consult for Recovery Care Planning/Counseling Complete  Complete  Palliative Care Screening Not Applicable  Not Applicable  Medication Review Oceanographer) Complete Complete Complete  PCP or Specialist appointment within 3-5 days of discharge  Complete   HRI or Home Care Consult  Complete   SW Recovery Care/Counseling Consult  Complete   Palliative Care Screening  Not Applicable   Skilled Nursing Facility  Complete

## 2023-07-10 NOTE — Plan of Care (Signed)

## 2023-07-10 NOTE — Progress Notes (Signed)
 PROGRESS NOTE    [name] [name]  FMW:[id number] DOB: [birth year] DOA: 07/05/2023 PCP: Regino Slater, MD    Chief Complaint  Patient presents with   Shortness of Breath    Brief Narrative:  78 y.o. male with medical history significant for severe COPD, chronic hypoxemic respiratory failure on 4L O2, RA, HFpEF, CAD by imaging, HTN, HLD, and generalized anxiety disorder who presents from rehab facility via EMS for progressive shortness of breath and admitted for COPD exacerbation and acute on chronic respiratory failure with hypoxia    Assessment & Plan:   Principal Problem:   Acute on chronic respiratory failure with hypoxia (HCC) Active Problems:   COPD exacerbation (HCC)   Essential hypertension   Generalized anxiety disorder   Constipation   Chronic respiratory failure with hypoxia (HCC)   Coronary artery calcification seen on CT scan   Hyperlipidemia   Rheumatoid arthritis (HCC)   Multifocal pneumonia   Hyperglycemia   Chronic diastolic CHF (congestive heart failure) (HCC)  #1 acute on chronic respiratory failure with hypoxia secondary to acute severe COPD exacerbation -Patient with end-stage COPD presented with progressive shortness of breath, hypoxia worsening from baseline. - Patient noted to have wheezing poor air movement on auscultation required BiPAP briefly while in the ED due to increased work of breathing. - Chest x-ray with persistent infiltrate but slightly improved. - Patient clinical improvement. -Hypoxia improved currently at baseline with 3 L nasal cannula.  Patient noted with some subjective shortness of breath. - Continue IV Solu-Medrol  40 mg every 12 hours and taper down to IV Solu-Medrol  40 mg daily. - Status post full course of azithromycin . - Continue Breztri , scheduled DuoNebs, PPI. - Supportive care. - Outpatient follow-up with primary pulmonologist on discharge.  2.  Recent history of multifocal pneumonia -Patient noted to have chest  x-ray imaging from 616 with multifocal pneumonia repeat chest x-ray on admission showing mild improvement. - Sputum cultures grew stenotrophomonas and patient noted to have been discharged home on 14 days of Bactrim . - Continue Bactrim  currently day 9/10 to complete course of treatment.  3.  Hyperglycemia -Secondary to steroids. - SSI as needed.  4.  HFpEF -2D echo from 06/2022 with a EF of 65 to 70%, G1 DD no valvular abnormalities. - Currently euvolemic on examination and compensated. - Continue home regimen Lasix .  5.  Hypertension  - Continue Lasix , Norvasc .  6.  CAD/hyperlipidemia -Stable. - Continue atorvastatin .  7.  Generalized anxiety disorder -Continue sertraline , Xanax  as needed.  8.  Rheumatoid arthritis -Continue methotrexate  weekly.  9.  Chronic constipation -Patient states had a good bowel movement yesterday. - Continue MiraLAX  twice daily, Colace twice daily, Senokot-S nightly.  10.  Generalized weakness -Patient seen by PT OT who are recommending SNF placement.   DVT prophylaxis: Lovenox  Code Status: Full Family Communication: Updated patient.  No family at bedside. Disposition: SNF when bed available hopefully in the next 24 hours.  Status is: Inpatient Remains inpatient appropriate because: Severity of illness   Consultants:  None  Procedures:  Chest x-ray 07/05/2023   Antimicrobials:  Anti-infectives (From admission, onward)    Start     Dose/Rate Route Frequency Ordered Stop   07/05/23 2200  sulfamethoxazole -trimethoprim  (BACTRIM  DS) 800-160 MG per tablet 1 tablet        1 tablet Oral Every 12 hours 07/05/23 1734     07/05/23 2100  azithromycin  (ZITHROMAX ) 500 mg in sodium chloride  0.9 % 250 mL IVPB        500  mg 250 mL/hr over 60 Minutes Intravenous Every 24 hours 07/05/23 1728 07/08/23 2154         Subjective: Sitting up in bed.  Feels shortness of breath and wheezing has improved.  States he is back to his chronic O2 of 3 L nasal  cannula.  Objective: Vitals:   07/10/23 0830 07/10/23 1128 07/10/23 1646 07/10/23 2024  BP:  (!) 146/72  (!) 155/73  Pulse:  95  97  Resp:  20  18  Temp:  (!) 97.5 F (36.4 C)  97.8 F (36.6 C)  TempSrc:  Oral  Oral  SpO2: 91% 94% 94% 90%  Weight:      Height:        Intake/Output Summary (Last 24 hours) at 07/10/2023 2028 Last data filed at 07/10/2023 1630 Gross per 24 hour  Intake 476 ml  Output 1150 ml  Net -674 ml   Filed Weights   07/06/23 0428  Weight: 86.9 kg    Examination:  General exam: NAD. Respiratory system: Minimal expiratory wheezing.  No crackles.  No rhonchi.  Fair air movement.  Cardiovascular system: S1 & S2 heard, RRR. No JVD, murmurs, rubs, gallops or clicks. No pedal edema. Gastrointestinal system: Abdomen is nondistended, soft and nontender. No organomegaly or masses felt. Normal bowel sounds heard. Central nervous system: Alert and oriented. No focal neurological deficits. Extremities: Symmetric 5 x 5 power. Skin: No rashes, lesions or ulcers Psychiatry: Judgement and insight appear normal. Mood & affect appropriate.     Data Reviewed: I have personally reviewed following labs and imaging studies  CBC: Recent Labs  Lab 07/05/23 1455 07/06/23 0732 07/07/23 0544 07/08/23 0535 07/09/23 0513 07/10/23 0524  WBC 8.1 12.0* 9.6 10.9* 10.5 9.2  NEUTROABS 7.4  --   --   --   --   --   HGB 14.0 12.9* 13.0 13.7 14.1 14.7  HCT 43.0 39.3 40.2 42.8 43.3 44.4  MCV 107.0* 107.4* 108.4* 108.1* 107.2* 106.0*  PLT 152 149* 157 180 187 182    Basic Metabolic Panel: Recent Labs  Lab 07/06/23 0732 07/07/23 0544 07/08/23 0535 07/09/23 0513 07/10/23 0524  NA 139 136 135 136 135  K 4.4 4.9 4.4 4.3 4.3  CL 97* 93* 95* 96* 94*  CO2 32 30 30 29 31   GLUCOSE 120* 139* 137* 137* 124*  BUN 16 14 17 16 14   CREATININE 0.53* 0.61 0.69 0.65 0.82  CALCIUM  9.2 9.3 9.1 9.4 9.3    GFR: Estimated Creatinine Clearance: 82.9 mL/min (by C-G formula based on SCr  of 0.82 mg/dL).  Liver Function Tests: Recent Labs  Lab 07/05/23 1455 07/07/23 0544 07/08/23 0535 07/09/23 0513 07/10/23 0524  AST 25 20 14* 17 17  ALT 25 22 20 19 23   ALKPHOS 101 103 103 103 111  BILITOT 0.8 0.8 0.7 0.7 0.7  PROT 6.3* 6.3* 6.1* 6.1* 6.3*  ALBUMIN 3.2* 3.3* 3.3* 3.2* 3.4*    CBG: Recent Labs  Lab 07/09/23 2014 07/10/23 0733 07/10/23 1125 07/10/23 1629 07/10/23 2024  GLUCAP 121* 86 102* 116* 133*     No results found for this or any previous visit (from the past 240 hours).       Radiology Studies: No results found.      Scheduled Meds:  amLODipine   5 mg Oral Daily   atorvastatin   20 mg Oral Daily   budesonide -glycopyrrolate -formoterol   2 puff Inhalation BID   carbamide peroxide  5 drop Right EAR BID   docusate sodium   100 mg Oral BID   enoxaparin  (LOVENOX ) injection  40 mg Subcutaneous Q24H   furosemide   40 mg Oral Daily   insulin aspart  0-15 Units Subcutaneous TID WC   ipratropium-albuterol   3 mL Nebulization QID   methylPREDNISolone  (SOLU-MEDROL ) injection  40 mg Intravenous Q12H   neomycin-polymyxin-hydrocortisone  3 drop Right EAR Q6H   pantoprazole   40 mg Oral Daily   polyethylene glycol  17 g Oral BID   senna-docusate  2 tablet Oral QHS   sertraline   100 mg Oral Daily   sulfamethoxazole -trimethoprim   1 tablet Oral Q12H   Continuous Infusions:   LOS: 5 days    Time spent: 40 mins    Toribio Hummer, MD [facility]   To contact the attending provider between 7A-7P or the covering provider during after hours 7P-7A, please log into the web site [email or web address] and access using universal [place] password for that web site. If you do not have the password, please call the hospital operator.  07/10/2023, 8:28 PM

## 2023-07-11 DIAGNOSIS — J441 Chronic obstructive pulmonary disease with (acute) exacerbation: Secondary | ICD-10-CM | POA: Diagnosis not present

## 2023-07-11 DIAGNOSIS — J9611 Chronic respiratory failure with hypoxia: Secondary | ICD-10-CM | POA: Diagnosis not present

## 2023-07-11 DIAGNOSIS — J9621 Acute and chronic respiratory failure with hypoxia: Secondary | ICD-10-CM | POA: Diagnosis not present

## 2023-07-11 DIAGNOSIS — F411 Generalized anxiety disorder: Secondary | ICD-10-CM | POA: Diagnosis not present

## 2023-07-11 LAB — GLUCOSE, CAPILLARY
Glucose-Capillary: 88 mg/dL (ref 70–99)
Glucose-Capillary: 103 mg/dL — ABNORMAL HIGH (ref 70–99)
Glucose-Capillary: 138 mg/dL — ABNORMAL HIGH (ref 70–99)
Glucose-Capillary: 146 mg/dL — ABNORMAL HIGH (ref 70–99)

## 2023-07-11 LAB — BASIC METABOLIC PANEL WITH GFR
Anion gap: 9 (ref 5–15)
BUN: 15 mg/dL (ref 8–23)
CO2: 31 mmol/L (ref 22–32)
Calcium: 9.1 mg/dL (ref 8.9–10.3)
Chloride: 95 mmol/L — ABNORMAL LOW (ref 98–111)
Creatinine, Ser: 0.99 mg/dL (ref 0.61–1.24)
GFR, Estimated: >60 mL/min (ref >60)
Glucose, Bld: 120 mg/dL — ABNORMAL HIGH (ref 70–99)
Potassium: 4.6 mmol/L (ref 3.5–5.1)
Sodium: 135 mmol/L (ref 135–145)

## 2023-07-11 LAB — CBC
HCT: 43 % (ref 39.0–52.0)
Hemoglobin: 14.1 g/dL (ref 13.0–17.0)
MCH: 34.5 pg — ABNORMAL HIGH (ref 26.0–34.0)
MCHC: 32.8 g/dL (ref 30.0–36.0)
MCV: 105.1 fL — ABNORMAL HIGH (ref 80.0–100.0)
Platelets: 187 10*3/uL (ref 150–400)
RBC: 4.09 MIL/uL — ABNORMAL LOW (ref 4.22–5.81)
RDW: 15.4 % (ref 11.5–15.5)
WBC: 7.8 10*3/uL (ref 4.0–10.5)
nRBC: 0 % (ref 0.0–0.2)

## 2023-07-11 MED ORDER — ONDANSETRON HCL 4 MG PO TABS: 4.0000 mg | ORAL_TABLET | Freq: Four times a day (QID) | ORAL | 0 refills | Status: DC | PRN

## 2023-07-11 MED ORDER — CARBAMIDE PEROXIDE 6.5 % OT SOLN: 5.0000 [drp] | Freq: Two times a day (BID) | OTIC | Status: AC

## 2023-07-11 MED ORDER — NEOMYCIN-POLYMYXIN-HC 3.5-10000-1 OT SOLN: 3.0000 [drp] | Freq: Four times a day (QID) | OTIC | Status: AC

## 2023-07-11 MED ORDER — IPRATROPIUM-ALBUTEROL 0.5-2.5 (3) MG/3ML IN SOLN: 3.0000 mL | Freq: Three times a day (TID) | RESPIRATORY_TRACT | Status: AC

## 2023-07-11 MED ORDER — ALPRAZOLAM 0.25 MG PO TABS: 0.2500 mg | ORAL_TABLET | Freq: Three times a day (TID) | ORAL | 0 refills | Status: AC | PRN

## 2023-07-11 MED ORDER — POLYETHYLENE GLYCOL 3350 17 G PO PACK: 17.0000 g | PACK | Freq: Two times a day (BID) | ORAL | Status: DC

## 2023-07-11 MED ORDER — PREDNISONE 20 MG PO TABS
60.0000 mg | ORAL_TABLET | Freq: Every day | ORAL | Status: AC
  Administered 2023-07-12 – 2023-07-14 (×3): 60 mg via ORAL
  Filled 2023-07-11 (×3): qty 3

## 2023-07-11 MED ORDER — PREDNISONE 20 MG PO TABS: ORAL_TABLET | ORAL | 0 refills | Status: DC

## 2023-07-11 NOTE — Progress Notes (Signed)
 Physical Therapy Treatment Patient Details Name: [name] [name] MRN: [id number] DOB: 1945/04/10 Today's Date: 07/11/2023   History of Present Illness Pt is a 78 y.o. male admitted for COPD exacerbation and acute on chronic respiratory failure with hypoxia.  Patient was recently hospitalized for multifocal pneumonia and COPD exacerbation from 6/16 to 6/23 and discharged to rehab facility. EFY:dzczmz COPD, chronic hypoxic respiratory failure on 4 L of oxygen  at baseline, rheumatoid arthritis, heart failure with preserved ejection fraction, CAD, hypertension, hyperlipidemia, generalized anxiety disorder    PT Comments  Pt very pleasant and eager to participate.  Pt able to ambulate short distance in hallway with standing rest break.  Pt currently on 3L at rest and 4L with activity (see SPO2 in mobility section below).  Pt with increased work of breathing and 3/4 dyspnea with minimal exertion.  Pt reports he anticipates d/c to SNF.  *Pt encouraged to perform short bouts of activity due to saturations and dyspnea so reviewed seated exercises to perform after taking a break from ambulation today.  Pt has been monitoring his saturations with his home pulse oximeter and reports understanding.     If plan is discharge home, recommend the following: A little help with walking and/or transfers;Assistance with cooking/housework;A little help with bathing/dressing/bathroom;Help with stairs or ramp for entrance;Assist for transportation   Can travel by private vehicle     Yes  Equipment Recommendations  None recommended by PT    Recommendations for Other Services       Precautions / Restrictions Precautions Precautions: Fall Precaution/Restrictions Comments: monitor sats, currently on 3L at rest and 4L with activity O2 via [place] Restrictions Weight Bearing Restrictions Per Provider Order: No     Mobility  Bed Mobility               General bed mobility comments: pt in recliner     Transfers Overall transfer level: Needs assistance Equipment used: Rolling walker (2 wheels) Transfers: Sit to/from Stand Sit to Stand: Supervision           General transfer comment: pt donned his shoes, requires rest break to improve SPO2 prior to ambulation, pt currently on 3L at rest and SpO2 97%, dropped to 87% with donning shoes; applied 4L for ambulation    Ambulation/Gait Ambulation/Gait assistance: Contact guard assist Gait Distance (Feet): 40 Feet (x2) Assistive device: Rolling walker (2 wheels) Gait Pattern/deviations: Step-through pattern, Decreased stride length       General Gait Details: verbal cues for use of RW, 40'x 2 with standing rest break; SpO2 dropped to 83% on 4L with first standing rest break and then 86% on 4L [place] upon returning to recliner (improved to 91% within 2 minutes of rest and pt returned to previous resting 3L)   Stairs             Wheelchair Mobility     Tilt Bed    Modified Rankin (Stroke Patients Only)       Balance                                            Communication Communication Communication: No apparent difficulties  Cognition Arousal: Alert Behavior During Therapy: WFL for tasks assessed/performed   PT - Cognitive impairments: No apparent impairments  Following commands: Intact      Cueing Cueing Techniques: Verbal cues  Exercises      General Comments        Pertinent Vitals/Pain Pain Assessment Pain Assessment: No/denies pain    Home Living                          Prior Function            PT Goals (current goals can now be found in the care plan section) Progress towards PT goals: Progressing toward goals    Frequency    Min 3X/week      PT Plan      Co-evaluation              AM-PAC PT 6 Clicks Mobility   Outcome Measure  Help needed turning from your back to your side while in a flat bed without  using bedrails?: None Help needed moving from lying on your back to sitting on the side of a flat bed without using bedrails?: None Help needed moving to and from a bed to a chair (including a wheelchair)?: A Little Help needed standing up from a chair using your arms (e.g., wheelchair or bedside chair)?: A Little Help needed to walk in hospital room?: A Little Help needed climbing 3-5 steps with a railing? : A Lot 6 Click Score: 19    End of Session Equipment Utilized During Treatment: Gait belt;Oxygen  Activity Tolerance: Patient tolerated treatment well Patient left: in chair;with call bell/phone within reach Nurse Communication: Mobility status PT Visit Diagnosis: Difficulty in walking, not elsewhere classified (R26.2)     Time: 8591-8574 PT Time Calculation (min) (ACUTE ONLY): 17 min  Charges:    $Gait Training: 8-22 mins PT General Charges $$ ACUTE PT VISIT: 1 Visit                     Tari KLEIN, DPT Physical Therapist Acute Rehabilitation Services Office: [phone]    Tari CROME [place] 07/11/2023, 3:31 PM

## 2023-07-11 NOTE — TOC Progression Note (Signed)
 Transition of Care [facility]) - Progression Note    Patient Details  Name: [name] [name] MRN: [id number] Date of Birth: Jun 24, 1945  Transition of Care [facility]) CM/SW Contact  Tiffanyann Deroo, Nathanel, RN Phone Number: 07/11/2023, 10:45 AM  Clinical Narrative: TC HTA rep Stephanie-still being reviewed no outcome-await outcome for auth to [facility], & [facility].      Expected Discharge Plan: Skilled Nursing Facility Barriers to Discharge: Insurance Authorization  Expected Discharge Plan and Services   Discharge Planning Services: CM Consult   Living arrangements for the past 2 months: Skilled Nursing Facility                                       Social Determinants of Health (SDOH) Interventions SDOH Screenings   Food Insecurity: No Food Insecurity (07/06/2023)  Housing: Low Risk  (07/06/2023)  Transportation Needs: No Transportation Needs (07/06/2023)  Utilities: Not At Risk (07/06/2023)  Depression (PHQ2-9): Low Risk  (03/09/2019)  Social Connections: Socially Isolated (07/06/2023)  Tobacco Use: Medium Risk (07/07/2023)    Readmission Risk Interventions    07/06/2023    4:46 PM 06/26/2023    2:14 PM 03/31/2023    3:55 PM  Readmission Risk Prevention Plan  Transportation Screening Complete Complete Complete  PCP or Specialist Appt within 3-5 Days Complete  Complete  HRI or Home Care Consult Complete  Complete  Social Work Consult for Recovery Care Planning/Counseling Complete  Complete  Palliative Care Screening Not Applicable  Not Applicable  Medication Review Oceanographer) Complete Complete Complete  PCP or Specialist appointment within 3-5 days of discharge  Complete   HRI or Home Care Consult  Complete   SW Recovery Care/Counseling Consult  Complete   Palliative Care Screening  Not Applicable   Skilled Nursing Facility  Complete

## 2023-07-11 NOTE — TOC Progression Note (Addendum)
 Transition of Care [facility]) - Progression Note    Patient Details  Name: [name] [name] MRN: [id number] Date of Birth: 07/15/1945  [facility]) CM/SW Contact  Fraya Ueda, Nathanel, RN Phone Number: 07/11/2023, 2:23 PM  Clinical Narrative: HTA rep Corean- clinicals has been sent to med review-await outcome even though d/c summary placed. bed delay placed. MD updated.  -3:16p Per [facility] [address] rep Darrian if auth received from HTA bed available today or tomorrow.    Expected Discharge Plan: Skilled Nursing Facility Barriers to Discharge: Insurance Authorization  Expected Discharge Plan and Services   Discharge Planning Services: CM Consult   Living arrangements for the past 2 months: Skilled Nursing Facility Expected Discharge Date: 07/11/23                                     Social Determinants of Health (SDOH) Interventions SDOH Screenings   Food Insecurity: No Food Insecurity (07/06/2023)  Housing: Low Risk  (07/06/2023)  Transportation Needs: No Transportation Needs (07/06/2023)  Utilities: Not At Risk (07/06/2023)  Depression (PHQ2-9): Low Risk  (03/09/2019)  Social Connections: Socially Isolated (07/06/2023)  Tobacco Use: Medium Risk (07/07/2023)    Readmission Risk Interventions    07/06/2023    4:46 PM 06/26/2023    2:14 PM 03/31/2023    3:55 PM  Readmission Risk Prevention Plan  Transportation Screening Complete Complete Complete  PCP or Specialist Appt within 3-5 Days Complete  Complete  HRI or Home Care Consult Complete  Complete  Social Work Consult for Recovery Care Planning/Counseling Complete  Complete  Palliative Care Screening Not Applicable  Not Applicable  Medication Review Oceanographer) Complete Complete Complete  PCP or Specialist appointment within 3-5 days of discharge  Complete   HRI or Home Care Consult  Complete   SW Recovery Care/Counseling Consult  Complete   Palliative Care Screening  Not Applicable   Skilled Nursing  Facility  Complete

## 2023-07-11 NOTE — Plan of Care (Signed)

## 2023-07-11 NOTE — Plan of Care (Signed)
  Problem: Education: Goal: Individualized Educational Video(s) Outcome: Progressing   Problem: Fluid Volume: Goal: Ability to maintain a balanced intake and output will improve Outcome: Progressing   Problem: Health Behavior/Discharge Planning: Goal: Ability to manage health-related needs will improve Outcome: Progressing

## 2023-07-11 NOTE — Discharge Summary (Addendum)
 Physician Discharge Summary  [name] [name] [facility] FMW:[id number] DOB: 1945/07/17 DOA: 07/05/2023  PCP: Regino Slater, MD  Admit date: 07/05/2023 Discharge date: 07/11/2023  Time spent: 60 minutes  Recommendations for Outpatient Follow-up:  Follow-up with MD at skilled nursing facility.  Patient will need a basic metabolic profile done in 1 week to follow-up on electrolytes and renal function. Follow-up with Dr. Jude, pulmonology in 1 to 2 weeks for further management of COPD and chronic hypoxic respiratory failure.Will need evaluation on follow-up to assess whether patient will benefit from nightly BiPAP.   Discharge Diagnoses:  Principal Problem:   Acute on chronic respiratory failure with hypoxia (HCC) Active Problems:   COPD exacerbation (HCC)   Essential hypertension   Generalized anxiety disorder   Constipation   Chronic respiratory failure with hypoxia (HCC)   Coronary artery calcification seen on CT scan   Hyperlipidemia   Rheumatoid arthritis (HCC)   Multifocal pneumonia   Hyperglycemia   Chronic diastolic CHF (congestive heart failure) (HCC)   Discharge Condition: Stable and improved.  Diet recommendation: Regular  Filed Weights   07/06/23 0428  Weight: 86.9 kg    History of present illness:  HPI per Dr.Amponsah Seena [name] [name] is a 78 y.o. 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 Patient was recently hospitalized for multifocal pneumonia and COPD exacerbation from 6/16 to 6/23 and discharged to rehab facility. Patient states he has had gradual worsening of his shortness of breath since discharge from the hospital.  His oxygen  levels do drop to the 70s while working with physical therapy.  He has significant been hypoxic to the mid to high 80s with minimal exertion such as walking from his  bedroom to the bathroom.  He endorsed a dry cough, chest tightness and some wheezing but denies any fevers, chills, abdominal pain, nausea, vomiting or dizziness.   On EMS arrival, patient was found to be very labored with SpO2 in the low 90s, down to 70s with exertion.  Patient was given 10 albuterol , 0.5 Atrovent, and 125 mg IV Solu-Medrol    ED Course: Initial vitals show temp 98.2, RR 38, HR 107, BP 165/68, SpO2 95% initially on BiPAP at 40% FiO2, transitioned to high flow Vinco on 7 L Lehr with SpO2 of 91%. Initial labs significant for WBC 8.1, normal kidney function, VBG shows pH 7.42, PCO248, PO278 and bicarb 31. EKG shows sinus tach. CXR shows persistent diffuse interstitial coarsening with unchanged asymmetric opacity in the left lung. Pt received IV mag 2 g x 1 and albuterol  neb.  Patient was placed on BiPAP briefly due to tachypnea and increased work of breathing.  He was transitioned to high flow nasal cannula and TRH was consulted for admission.    Hospital Course:  #1 acute on chronic respiratory failure with hypoxia secondary to acute severe COPD exacerbation -Patient with end-stage COPD presented with progressive shortness of breath, hypoxia worsening from baseline. - Patient noted to have wheezing poor air movement on auscultation required BiPAP briefly while in the ED due to increased work of breathing. - Chest x-ray with persistent infiltrate but slightly improved. - Patient was placed on IV Solu-Medrol , Breztri , scheduled DuoNebs, PPI with clinical improvement during the hospitalization with improvement of hypoxia back to baseline of 3 L nasal cannula. -Patient completed a course of azithromycin  during the hospitalization. -Patient was discharged  on a steroid taper. -Outpatient follow-up with primary pulmonologist.   2.  Recent history of multifocal pneumonia -Patient noted to have chest x-ray imaging from 616 with multifocal pneumonia repeat chest x-ray on admission showing mild  improvement. - Sputum cultures grew stenotrophomonas and patient noted to have been discharged home on 14 days of Bactrim . - Patient completed course of Bactrim  during the hospitalization.   -No further antibiotics needed on discharge.    3.  Hyperglycemia -Secondary to steroids. - Patient placed on SSI as needed.   4.  HFpEF -2D echo from 06/2022 with a EF of 65 to 70%, G1 DD no valvular abnormalities. - Patient noted to be euvolemic during the hospitalization and maintained on home regimen Lasix .     5.  Hypertension  - Controlled on home regimen Lasix , Norvasc .   6.  CAD/hyperlipidemia -Stable. - Patient maintained on home regimen atorvastatin .   7.  Generalized anxiety disorder - Patient maintained on home regimen sertraline , Xanax  as needed.   8.  Rheumatoid arthritis - Patient's methotrexate  was held and will be resumed postdischarge.    9.  Chronic constipation - Patient placed on bowel regimen of MiraLAX  twice daily, Colace twice daily and Senokot-S nightly with good results.   - Patient be discharged on bowel regimen.    10.  Generalized weakness -Patient seen by PT /OT who are recommending SNF placement. - Patient with discharge to SNF.      Procedures: Chest x-ray 07/05/2023  Consultations: None  Discharge Exam: Vitals:   07/11/23 1248 07/11/23 1314  BP:  (!) 114/59  Pulse:  96  Resp:  16  Temp:  98.2 F (36.8 C)  SpO2: 97% 96%    General: NAD Cardiovascular: RRR no murmurs rubs or gallops.  No JVD.  No lower extremity edema. Respiratory: Clear to auscultation bilaterally.  No wheezes, no crackles, no rhonchi.  Fair air movement.  Speaking in full sentences.  Discharge Instructions   Discharge Instructions     Diet general   Complete by: As directed    Increase activity slowly   Complete by: As directed    Pulmonary Visit   Complete by: As directed    HFU   Reason for referral: Other Pulmonary      Allergies as of 07/11/2023        Reactions   Fluticasone -salmeterol Shortness Of Breath, Other (See Comments)   Worsening shortness of breath..        Medication List     STOP taking these medications    sulfamethoxazole -trimethoprim  800-160 MG tablet Commonly known as: BACTRIM  DS       TAKE these medications    albuterol  108 (90 Base) MCG/ACT inhaler Commonly known as: VENTOLIN  HFA Inhale 2 puffs into the lungs every 6 (six) hours as needed for wheezing or shortness of breath.   ALPRAZolam  0.25 MG tablet Commonly known as: XANAX  Take 1 tablet (0.25 mg total) by mouth 3 (three) times daily as needed for anxiety.   amLODipine  5 MG tablet Commonly known as: NORVASC  Take 5 mg by mouth daily.   atorvastatin  20 MG tablet Commonly known as: LIPITOR Take 20 mg by mouth daily.   Breztri  Aerosphere 160-9-4.8 MCG/ACT Aero inhaler Generic drug: budesonide -glycopyrrolate -formoterol  Inhale 2 puffs into the lungs in the morning and at bedtime.   carbamide peroxide 6.5 % OTIC solution Commonly known as: DEBROX Place 5 drops into the right ear 2 (two) times daily for 7 days.   cyanocobalamin  1000 MCG tablet  Commonly known as: VITAMIN B12 Take 1,000 mcg by mouth daily.   docusate sodium  100 MG capsule Commonly known as: COLACE Take 1 capsule (100 mg total) by mouth 2 (two) times daily.   folic acid  1 MG tablet Commonly known as: FOLVITE  Take 1 mg by mouth daily.   furosemide  40 MG tablet Commonly known as: LASIX  Take 40 mg by mouth in the morning.   ipratropium-albuterol  0.5-2.5 (3) MG/3ML Soln Commonly known as: DUONEB Take 3 mLs by nebulization 3 (three) times daily. What changed: when to take this   melatonin 3 MG Tabs tablet Take 1 tablet (3 mg total) by mouth at bedtime as needed.   methocarbamol  500 MG tablet Commonly known as: ROBAXIN  Take 1 tablet (500 mg total) by mouth every 8 (eight) hours as needed for muscle spasms.   methotrexate  50 MG/2ML injection Inject 20 mg into the skin  every Sunday.   multivitamin with minerals tablet Take 1 tablet by mouth daily with breakfast.   neomycin -polymyxin-hydrocortisone OTIC solution Commonly known as: CORTISPORIN  Place 3 drops into the right ear every 6 (six) hours for 7 days.   omeprazole 20 MG tablet Commonly known as: PRILOSEC OTC Take 20 mg by mouth daily before breakfast.   ondansetron  4 MG tablet Commonly known as: ZOFRAN  Take 1 tablet (4 mg total) by mouth every 6 (six) hours as needed for nausea.   OXYGEN  Inhale 4 L/min into the lungs continuous.   polyethylene glycol 17 g packet Commonly known as: MIRALAX  / GLYCOLAX  Take 17 g by mouth 2 (two) times daily. What changed:  when to take this reasons to take this   predniSONE  20 MG tablet Commonly known as: DELTASONE  Take 3 tablets (60 mg total) by mouth daily before breakfast for 3 days, THEN 2 tablets (40 mg total) daily before breakfast for 3 days, THEN 1 tablet (20 mg total) daily before breakfast for 3 days. Start taking on: July 12, 2023 What changed:  medication strength See the new instructions.   senna-docusate 8.6-50 MG tablet Commonly known as: Senokot-S Take 2 tablets by mouth at bedtime.   sertraline  100 MG tablet Commonly known as: ZOLOFT  Take 100 mg by mouth daily.   TYLENOL  500 MG tablet Generic drug: acetaminophen  Take 500 mg by mouth every 8 (eight) hours as needed for mild pain (pain score 1-3) or headache.       Allergies  Allergen Reactions   Fluticasone -Salmeterol Shortness Of Breath and Other (See Comments)    Worsening shortness of breath..    Follow-up Information     MD AT SNF Follow up.          Jude Harden GAILS, MD. Schedule an appointment as soon as possible for a visit in 2 week(s).   Specialty: Pulmonary Disease Why: Follow-up in 1 to 2 weeks Contact information: [address] [place] [employer] [place] [address] [phone]                  The results of significant diagnostics from this  hospitalization (including imaging, microbiology, ancillary and laboratory) are listed below for reference.    Significant Diagnostic Studies: DG Chest Port 1 View Result Date: 07/05/2023 CLINICAL DATA:  Shortness of breath. EXAM: PORTABLE CHEST 1 VIEW COMPARISON:  06/24/2023. FINDINGS: Stable cardiomediastinal silhouette. Persistent diffuse interstitial coarsening with emphysema. Similar asymmetric patchy opacity in the left lung. No pleural effusion or pneumothorax. No acute osseous abnormality. IMPRESSION: Persistent diffuse interstitial coarsening with similar asymmetric opacity in the left lung, concerning for  superimposed infectious/inflammatory process. Electronically Signed   By: Harrietta Sherry M.D.   On: 07/05/2023 15:57   DG Chest Port 1 View Result Date: 06/24/2023 EXAM: 1 VIEW XRAY OF THE CHEST 06/24/2023 07:45:00 PM COMPARISON: CT chest dated 06/18/2023. CLINICAL HISTORY: SOB hypoxia. SOB/hypoxia - Pt presents to ED from home via EMS for SOB for over a month. Hx of COPD pt states is worsening. Pt has increased baseline oxygen  from 3L to 5L. Pt was 80% on 5L when EMS arrived- placed on 10L NRB. FINDINGS: LUNGS AND PLEURA: Coarse interstitial markings and emphysematous changes. Superimposed patchy opacities in the left mid lung and right lower lung, suggesting multifocal pneumonia. No pleural effusion. No pneumothorax. HEART AND MEDIASTINUM: No acute abnormality of the cardiac and mediastinal silhouettes. Thoracic aortic atherosclerosis. BONES AND SOFT TISSUES: No acute osseous abnormality. IMPRESSION: 1. Scattered patchy opacities, suspicious for multifocal pneumonia. Electronically signed by: Pinkie Pebbles MD 06/24/2023 07:50 PM EDT RP Workstation: [id number]   CT Angio Chest W/Cm &/Or Wo Cm Result Date: 06/18/2023 CLINICAL DATA:  Hypoxia shortness of breath EXAM: CT ANGIOGRAPHY CHEST WITH CONTRAST TECHNIQUE: Multidetector CT imaging of the chest was performed using the standard protocol  during bolus administration of intravenous contrast. Multiplanar CT image reconstructions and MIPs were obtained to evaluate the vascular anatomy. RADIATION DOSE REDUCTION: This exam was performed according to the departmental dose-optimization program which includes automated exposure control, adjustment of the mA and/or kV according to patient size and/or use of iterative reconstruction technique. CONTRAST:  75mL OMNIPAQUE  IOHEXOL  350 MG/ML SOLN COMPARISON:  Chest x-ray 06/10/2023, CT chest 03/22/2023, 10/04/2022 FINDINGS: Cardiovascular: Mild aortic atherosclerosis. No aneurysm. Coronary vascular calcification. Normal cardiac size. No pericardial effusion Mediastinum/Nodes: Patent trachea. No thyroid  mass. No suspicious lymph nodes. Esophagus within normal limits Lungs/Pleura: Advanced emphysema. No acute airspace disease, pleural effusion or pneumothorax. Mild bronchiectasis in the right upper lobe and probable scarring. Stable 4 mm right middle lobe subpleural nodule on series 6, image 84, no specific imaging follow-up is recommended. Upper Abdomen: No acute finding Musculoskeletal: Interval moderate compression fracture at T7, involves the inferior endplate. Review of the MIP images confirms the above findings. IMPRESSION: 1. Negative for acute pulmonary embolus or aortic dissection. 2. Advanced emphysema. No acute airspace disease. 3. Interval moderate compression fracture at T7. 4. Aortic atherosclerosis. Aortic Atherosclerosis (ICD10-I70.0) and Emphysema (ICD10-J43.9). Electronically Signed   By: Luke Bun M.D.   On: 06/18/2023 16:39    Microbiology: No results found for this or any previous visit (from the past 240 hours).   Labs: Basic Metabolic Panel: Recent Labs  Lab 07/07/23 0544 07/08/23 0535 07/09/23 0513 07/10/23 0524 07/11/23 0456  NA 136 135 136 135 135  K 4.9 4.4 4.3 4.3 4.6  CL 93* 95* 96* 94* 95*  CO2 30 30 29 31 31   GLUCOSE 139* 137* 137* 124* 120*  BUN 14 17 16 14 15    CREATININE 0.61 0.69 0.65 0.82 0.99  CALCIUM  9.3 9.1 9.4 9.3 9.1   Liver Function Tests: Recent Labs  Lab 07/05/23 1455 07/07/23 0544 07/08/23 0535 07/09/23 0513 07/10/23 0524  AST 25 20 14* 17 17  ALT 25 22 20 19 23   ALKPHOS 101 103 103 103 111  BILITOT 0.8 0.8 0.7 0.7 0.7  PROT 6.3* 6.3* 6.1* 6.1* 6.3*  ALBUMIN 3.2* 3.3* 3.3* 3.2* 3.4*   No results for input(s): LIPASE, AMYLASE in the last 168 hours. No results for input(s): AMMONIA in the last 168 hours. CBC: Recent Labs  Lab  07/05/23 1455 07/06/23 0732 07/07/23 0544 07/08/23 0535 07/09/23 0513 07/10/23 0524 07/11/23 0456  WBC 8.1   < > 9.6 10.9* 10.5 9.2 7.8  NEUTROABS 7.4  --   --   --   --   --   --   HGB 14.0   < > 13.0 13.7 14.1 14.7 14.1  HCT 43.0   < > 40.2 42.8 43.3 44.4 43.0  MCV 107.0*   < > 108.4* 108.1* 107.2* 106.0* 105.1*  PLT 152   < > 157 180 187 182 187   < > = values in this interval not displayed.   Cardiac Enzymes: No results for input(s): CKTOTAL, CKMB, CKMBINDEX, TROPONINI in the last 168 hours. BNP: BNP (last 3 results) Recent Labs    03/29/23 1742 04/02/23 1043 06/24/23 1930  BNP 136.4* 99.7 35.6    ProBNP (last 3 results) No results for input(s): PROBNP in the last 8760 hours.  CBG: Recent Labs  Lab 07/10/23 1125 07/10/23 1629 07/10/23 2024 07/11/23 0751 07/11/23 1115  GLUCAP 102* 116* 133* 88 103*       Signed:  Toribio Hummer MD.  [facility] 07/11/2023, 2:19 PM

## 2023-07-12 DIAGNOSIS — K5900 Constipation, unspecified: Secondary | ICD-10-CM | POA: Diagnosis not present

## 2023-07-12 DIAGNOSIS — E782 Mixed hyperlipidemia: Secondary | ICD-10-CM | POA: Diagnosis not present

## 2023-07-12 DIAGNOSIS — J441 Chronic obstructive pulmonary disease with (acute) exacerbation: Secondary | ICD-10-CM | POA: Diagnosis not present

## 2023-07-12 DIAGNOSIS — J9621 Acute and chronic respiratory failure with hypoxia: Secondary | ICD-10-CM | POA: Diagnosis not present

## 2023-07-12 LAB — GLUCOSE, CAPILLARY
Glucose-Capillary: 86 mg/dL (ref 70–99)
Glucose-Capillary: 110 mg/dL — ABNORMAL HIGH (ref 70–99)
Glucose-Capillary: 218 mg/dL — ABNORMAL HIGH (ref 70–99)
Glucose-Capillary: 135 mg/dL — ABNORMAL HIGH (ref 70–99)

## 2023-07-12 MED ORDER — IPRATROPIUM-ALBUTEROL 0.5-2.5 (3) MG/3ML IN SOLN
3.0000 mL | Freq: Three times a day (TID) | RESPIRATORY_TRACT | Status: DC
  Administered 2023-07-12 – 2023-07-15 (×10): 3 mL via RESPIRATORY_TRACT
  Filled 2023-07-12 (×10): qty 3

## 2023-07-12 NOTE — Progress Notes (Signed)
 PROGRESS NOTE    [name] [name]  FMW:[id number] DOB: 1945/06/07 DOA: 07/05/2023 PCP: Regino Slater, MD    Chief Complaint  Patient presents with   Shortness of Breath    Brief Narrative:  78 y.o. male with medical history significant for severe COPD, chronic hypoxemic respiratory failure on 4L O2, RA, HFpEF, CAD by imaging, HTN, HLD, and generalized anxiety disorder who presents from rehab facility via EMS for progressive shortness of breath and admitted for COPD exacerbation and acute on chronic respiratory failure with hypoxia    Assessment & Plan:   Principal Problem:   Acute on chronic respiratory failure with hypoxia (HCC) Active Problems:   COPD exacerbation (HCC)   Essential hypertension   Generalized anxiety disorder   Constipation   Chronic respiratory failure with hypoxia (HCC)   Coronary artery calcification seen on CT scan   Hyperlipidemia   Rheumatoid arthritis (HCC)   Multifocal pneumonia   Hyperglycemia   Chronic diastolic CHF (congestive heart failure) (HCC)  #1 acute on chronic respiratory failure with hypoxia secondary to acute severe COPD exacerbation -Patient with end-stage COPD presented with progressive shortness of breath, hypoxia worsening from baseline. - Patient noted to have wheezing poor air movement on auscultation required BiPAP briefly while in the ED due to increased work of breathing. - Chest x-ray with persistent infiltrate but slightly improved. - Patient clinical improvement. -Hypoxia improved currently at baseline with 3 L nasal cannula.  Patient noted with some subjective shortness of breath. - Transition from IV Solu-Medrol  to oral prednisone  taper.   - Status post full course of azithromycin . - Continue Breztri , scheduled DuoNebs, PPI. - Supportive care. - Outpatient follow-up with primary pulmonologist on discharge.  2.  Recent history of multifocal pneumonia -Patient noted to have chest x-ray imaging from 616 with  multifocal pneumonia repeat chest x-ray on admission showing mild improvement. - Sputum cultures grew stenotrophomonas and patient noted to have been discharged home on 14 days of Bactrim . - Status post completion of Bactrim  on 07/11/2023.  3.  Hyperglycemia -Secondary to steroids. - SSI as needed.  4.  HFpEF -2D echo from 06/2022 with a EF of 65 to 70%, G1 DD no valvular abnormalities. - Currently euvolemic on examination and compensated. - Continue home regimen Lasix .  5.  Hypertension  - Norvasc , Lasix .    6.  CAD/hyperlipidemia -Stable. - Continue atorvastatin .  7.  Generalized anxiety disorder - Sertraline , Xanax  as needed.   8.  Rheumatoid arthritis -Continue methotrexate  weekly.  9.  Chronic constipation - Continue current bowel regimen of MiraLAX  twice daily, senna Colace twice daily, Senokot-S nightly.    10.  Generalized weakness -Patient seen by PT OT who are recommending SNF placement.   DVT prophylaxis: Lovenox  Code Status: Full Family Communication: Updated patient.  No family at bedside. Disposition: SNF when bed available hopefully in the next 24 hours.  Status is: Inpatient Remains inpatient appropriate because: Severity of illness   Consultants:  None  Procedures:  Chest x-ray 07/05/2023   Antimicrobials:  Anti-infectives (From admission, onward)    Start     Dose/Rate Route Frequency Ordered Stop   07/05/23 2200  sulfamethoxazole -trimethoprim  (BACTRIM  DS) 800-160 MG per tablet 1 tablet        1 tablet Oral Every 12 hours 07/05/23 1734 07/11/23 2213   07/05/23 2100  azithromycin  (ZITHROMAX ) 500 mg in sodium chloride  0.9 % 250 mL IVPB        500 mg 250 mL/hr over 60 Minutes Intravenous Every 24  hours 07/05/23 1728 07/08/23 2154         Subjective: Patient lying in bed.  Stated had a significant bowel movement this morning.  Denies any chest pain.  Feels shortness of breath is improving and close to baseline.    Objective: Vitals:    07/12/23 0740 07/12/23 0900 07/12/23 1100 07/12/23 1223  BP:    (!) 160/81  Pulse:    (!) 107  Resp:    (!) 24  Temp:      TempSrc:      SpO2: 97% 98% (!) 86% (!) 87%  Weight:      Height:        Intake/Output Summary (Last 24 hours) at 07/12/2023 1253 Last data filed at 07/12/2023 1232 Gross per 24 hour  Intake 800 ml  Output 1000 ml  Net -200 ml   Filed Weights   07/06/23 0428  Weight: 86.9 kg    Examination:  General exam: NAD. Respiratory system: Minimal expiratory wheezing.  No crackles.  Fair air movement.  Speaking in full sentences.  Cardiovascular system: Regular rate rhythm no murmurs rubs or gallops.  No JVD.  No pitting lower extremity edema.  Gastrointestinal system: Abdomen is soft, nontender, nondistended, positive bowel sounds.  No rebound.  No guarding. Central nervous system: Alert and oriented.  Moving extremities spontaneously.  No focal neurological deficits. Extremities: Symmetric 5 x 5 power. Skin: No rashes, lesions or ulcers Psychiatry: Judgement and insight appear normal. Mood & affect appropriate.     Data Reviewed: I have personally reviewed following labs and imaging studies  CBC: Recent Labs  Lab 07/05/23 1455 07/06/23 0732 07/07/23 0544 07/08/23 0535 07/09/23 0513 07/10/23 0524 07/11/23 0456  WBC 8.1   < > 9.6 10.9* 10.5 9.2 7.8  NEUTROABS 7.4  --   --   --   --   --   --   HGB 14.0   < > 13.0 13.7 14.1 14.7 14.1  HCT 43.0   < > 40.2 42.8 43.3 44.4 43.0  MCV 107.0*   < > 108.4* 108.1* 107.2* 106.0* 105.1*  PLT 152   < > 157 180 187 182 187   < > = values in this interval not displayed.    Basic Metabolic Panel: Recent Labs  Lab 07/07/23 0544 07/08/23 0535 07/09/23 0513 07/10/23 0524 07/11/23 0456  NA 136 135 136 135 135  K 4.9 4.4 4.3 4.3 4.6  CL 93* 95* 96* 94* 95*  CO2 30 30 29 31 31   GLUCOSE 139* 137* 137* 124* 120*  BUN 14 17 16 14 15   CREATININE 0.61 0.69 0.65 0.82 0.99  CALCIUM  9.3 9.1 9.4 9.3 9.1     GFR: Estimated Creatinine Clearance: 68.7 mL/min (by C-G formula based on SCr of 0.99 mg/dL).  Liver Function Tests: Recent Labs  Lab 07/05/23 1455 07/07/23 0544 07/08/23 0535 07/09/23 0513 07/10/23 0524  AST 25 20 14* 17 17  ALT 25 22 20 19 23   ALKPHOS 101 103 103 103 111  BILITOT 0.8 0.8 0.7 0.7 0.7  PROT 6.3* 6.3* 6.1* 6.1* 6.3*  ALBUMIN 3.2* 3.3* 3.3* 3.2* 3.4*    CBG: Recent Labs  Lab 07/11/23 1115 07/11/23 1637 07/11/23 2050 07/12/23 0718 07/12/23 1102  GLUCAP 103* 138* 146* 86 110*     No results found for this or any previous visit (from the past 240 hours).       Radiology Studies: No results found.      Scheduled Meds:  amLODipine   5 mg Oral Daily   atorvastatin   20 mg Oral Daily   budesonide -glycopyrrolate -formoterol   2 puff Inhalation BID   carbamide peroxide  5 drop Right EAR BID   docusate sodium   100 mg Oral BID   enoxaparin  (LOVENOX ) injection  40 mg Subcutaneous Q24H   furosemide   40 mg Oral Daily   insulin  aspart  0-15 Units Subcutaneous TID WC   ipratropium-albuterol   3 mL Nebulization TID   neomycin -polymyxin-hydrocortisone  3 drop Right EAR Q6H   pantoprazole   40 mg Oral Daily   polyethylene glycol  17 g Oral BID   predniSONE   60 mg Oral QAC breakfast   senna-docusate  2 tablet Oral QHS   sertraline   100 mg Oral Daily   Continuous Infusions:   LOS: 7 days    Time spent: 40 mins    Toribio Hummer, MD [facility]   To contact the attending provider between 7A-7P or the covering provider during after hours 7P-7A, please log into the web site [email or web address] and access using universal [place] password for that web site. If you do not have the password, please call the hospital operator.  07/12/2023, 12:53 PM

## 2023-07-12 NOTE — TOC Progression Note (Signed)
 Transition of Care [facility]) - Progression Note    Patient Details  Name: [name] MRN: [id number] Date of Birth: 05-Mar-1945  Transition of Care [facility]) CM/SW Contact  Sonda Manuella Quill, RN Phone Number: 07/12/2023, 10:08 AM  Clinical Narrative:    Notified that Debbie/ [employer] states Dr Janit is requesting Peer to Peer by 12 noon p#[phone] Case Reference#[id number]; Dr Sebastian notified via secure chat.   Expected Discharge Plan: Skilled Nursing Facility Barriers to Discharge: Insurance Authorization  Expected Discharge Plan and Services   Discharge Planning Services: CM Consult   Living arrangements for the past 2 months: Skilled Nursing Facility Expected Discharge Date: 07/11/23                                     Social Determinants of Health (SDOH) Interventions SDOH Screenings   Food Insecurity: No Food Insecurity (07/06/2023)  Housing: Low Risk  (07/06/2023)  Transportation Needs: No Transportation Needs (07/06/2023)  Utilities: Not At Risk (07/06/2023)  Depression (PHQ2-9): Low Risk  (03/09/2019)  Social Connections: Socially Isolated (07/06/2023)  Tobacco Use: Medium Risk (07/07/2023)    Readmission Risk Interventions    07/06/2023    4:46 PM 06/26/2023    2:14 PM 03/31/2023    3:55 PM  Readmission Risk Prevention Plan  Transportation Screening Complete Complete Complete  PCP or Specialist Appt within 3-5 Days Complete  Complete  HRI or Home Care Consult Complete  Complete  Social Work Consult for Recovery Care Planning/Counseling Complete  Complete  Palliative Care Screening Not Applicable  Not Applicable  Medication Review Oceanographer) Complete Complete Complete  PCP or Specialist appointment within 3-5 days of discharge  Complete   HRI or Home Care Consult  Complete   SW Recovery Care/Counseling Consult  Complete   Palliative Care Screening  Not Applicable   Skilled Nursing Facility  Complete

## 2023-07-13 DIAGNOSIS — J9611 Chronic respiratory failure with hypoxia: Secondary | ICD-10-CM | POA: Diagnosis not present

## 2023-07-13 DIAGNOSIS — J189 Pneumonia, unspecified organism: Secondary | ICD-10-CM | POA: Diagnosis not present

## 2023-07-13 DIAGNOSIS — J9621 Acute and chronic respiratory failure with hypoxia: Secondary | ICD-10-CM | POA: Diagnosis not present

## 2023-07-13 DIAGNOSIS — J441 Chronic obstructive pulmonary disease with (acute) exacerbation: Secondary | ICD-10-CM | POA: Diagnosis not present

## 2023-07-13 LAB — GLUCOSE, CAPILLARY
Glucose-Capillary: 83 mg/dL (ref 70–99)
Glucose-Capillary: 101 mg/dL — ABNORMAL HIGH (ref 70–99)
Glucose-Capillary: 192 mg/dL — ABNORMAL HIGH (ref 70–99)
Glucose-Capillary: 173 mg/dL — ABNORMAL HIGH (ref 70–99)

## 2023-07-13 NOTE — Progress Notes (Signed)
 Physical Therapy Treatment Patient Details Name: [name] [name] MRN: [id number] DOB: April 17, 1945 Today's Date: 07/13/2023   History of Present Illness Pt is a 78 y.o. male admitted for COPD exacerbation and acute on chronic respiratory failure with hypoxia.  Patient was recently hospitalized for multifocal pneumonia and COPD exacerbation from 6/16 to 6/23 and discharged to rehab facility. EFY:dzczmz COPD, chronic hypoxic respiratory failure on 4 L of oxygen  at baseline, rheumatoid arthritis, heart failure with preserved ejection fraction, CAD, hypertension, hyperlipidemia, generalized anxiety disorder    PT Comments  Pt assisted with ambulating short distance in hallway.  Pt with increased work of breathing (RR up to 40s) and drop of SPO2 to 83% on 4L O2 [place] with ambulation.  Pt required seated rest break today during ambulation.  Dr. Sebastian in to assess pt at end of session and reports increasing liters with activity is okay (pt has been trying to stay at his baseline).  Pt does well with monitoring his breathing and checking saturations with his pulse oximeter from home.  Pt reports he anticipates d/c to SNF pending insurance.    If plan is discharge home, recommend the following: A little help with walking and/or transfers;Assistance with cooking/housework;A little help with bathing/dressing/bathroom;Help with stairs or ramp for entrance;Assist for transportation   Can travel by private vehicle     Yes  Equipment Recommendations  None recommended by PT    Recommendations for Other Services       Precautions / Restrictions Precautions Precautions: Fall Precaution/Restrictions Comments: monitor sats, currently on 3L at rest and 4L with activity O2 via [place]     Mobility  Bed Mobility               General bed mobility comments: pt in recliner    Transfers Overall transfer level: Needs assistance Equipment used: Rolling walker (2 wheels) Transfers: Sit to/from Stand Sit  to Stand: Supervision           General transfer comment: pt donned his shoes, requires rest break to improve SPO2 prior to ambulation, pt currently on 3L at rest, SpO2 dropped to 85% with donning shoes; applied 4L for ambulation    Ambulation/Gait Ambulation/Gait assistance: Contact guard assist Gait Distance (Feet): 40 Feet (x2) Assistive device: Rollator (4 wheels) Gait Pattern/deviations: Step-through pattern, Decreased stride length Gait velocity: decreased     General Gait Details: utilized rollator today and required seated rest break, 40'x 2; SpO2 dropped to 83% on 4L with first standing rest break and then 83% on 4L [place] upon returning to recliner (improved to 91% within 3 minutes of rest and pt returned to previous resting 3L)   Stairs             Wheelchair Mobility     Tilt Bed    Modified Rankin (Stroke Patients Only)       Balance                                            Communication Communication Communication: No apparent difficulties  Cognition Arousal: Alert Behavior During Therapy: WFL for tasks assessed/performed   PT - Cognitive impairments: No apparent impairments                         Following commands: Intact      Cueing Cueing Techniques: Verbal cues  Exercises  General Comments        Pertinent Vitals/Pain Pain Assessment Pain Assessment: No/denies pain    Home Living                          Prior Function            PT Goals (current goals can now be found in the care plan section) Progress towards PT goals: Progressing toward goals    Frequency    Min 3X/week      PT Plan      Co-evaluation              AM-PAC PT 6 Clicks Mobility   Outcome Measure  Help needed turning from your back to your side while in a flat bed without using bedrails?: None Help needed moving from lying on your back to sitting on the side of a flat bed without using  bedrails?: None Help needed moving to and from a bed to a chair (including a wheelchair)?: A Little Help needed standing up from a chair using your arms (e.g., wheelchair or bedside chair)?: A Little Help needed to walk in hospital room?: A Little Help needed climbing 3-5 steps with a railing? : A Lot 6 Click Score: 19    End of Session Equipment Utilized During Treatment: Gait belt;Oxygen  Activity Tolerance: Patient tolerated treatment well Patient left: with call bell/phone within reach;in chair Nurse Communication: Mobility status PT Visit Diagnosis: Difficulty in walking, not elsewhere classified (R26.2)     Time: 1007-1030 PT Time Calculation (min) (ACUTE ONLY): 23 min  Charges:    $Gait Training: 23-37 mins PT General Charges $$ ACUTE PT VISIT: 1 Visit                    Tari KLEIN, DPT Physical Therapist Acute Rehabilitation Services Office: [phone]   Kati L Payson 07/13/2023, 2:03 PM

## 2023-07-13 NOTE — Progress Notes (Signed)
 PROGRESS NOTE    [name] [name]  FMW:[id number] DOB: 02/20/1945 DOA: 07/05/2023 PCP: Regino Slater, MD    Chief Complaint  Patient presents with   Shortness of Breath    Brief Narrative:  78 y.o. male with medical history significant for severe COPD, chronic hypoxemic respiratory failure on 4L O2, RA, HFpEF, CAD by imaging, HTN, HLD, and generalized anxiety disorder who presents from rehab facility via EMS for progressive shortness of breath and admitted for COPD exacerbation and acute on chronic respiratory failure with hypoxia    Assessment & Plan:   Principal Problem:   Acute on chronic respiratory failure with hypoxia (HCC) Active Problems:   COPD exacerbation (HCC)   Essential hypertension   Generalized anxiety disorder   Constipation   Chronic respiratory failure with hypoxia (HCC)   Coronary artery calcification seen on CT scan   Hyperlipidemia   Rheumatoid arthritis (HCC)   Multifocal pneumonia   Hyperglycemia   Chronic diastolic CHF (congestive heart failure) (HCC)  #1 acute on chronic respiratory failure with hypoxia secondary to acute severe COPD exacerbation -Patient with end-stage COPD presented with progressive shortness of breath, hypoxia worsening from baseline. - Patient noted to have wheezing poor air movement on auscultation required BiPAP briefly while in the ED due to increased work of breathing. - Chest x-ray with persistent infiltrate but slightly improved. - Patient clinical improvement. -Hypoxia improved currently at baseline with 3 L nasal cannula.  Patient noted with some subjective shortness of breath. - Was on IV Solu-Medrol  and transitioned to prednisone  taper. - Status post full course of azithromycin . - Continue Breztri , scheduled DuoNebs, PPI. - Supportive care. - Outpatient follow-up with primary pulmonologist on discharge.  2.  Recent history of multifocal pneumonia -Patient noted to have chest x-ray imaging from 616 with  multifocal pneumonia repeat chest x-ray on admission showing mild improvement. - Sputum cultures grew stenotrophomonas and patient noted to have been discharged home on 14 days of Bactrim . - Status post completion of Bactrim  on 07/11/2023.  3.  Hyperglycemia -Secondary to steroids. - SSI as needed.  4.  HFpEF -2D echo from 06/2022 with a EF of 65 to 70%, G1 DD no valvular abnormalities. - Currently euvolemic on examination and compensated. - Continue home regimen Lasix .  5.  Hypertension  - Continue Lasix , Norvasc .   6.  CAD/hyperlipidemia -Stable. - Continue atorvastatin .  7.  Generalized anxiety disorder - Continue sertraline , Xanax  as needed.     8.  Rheumatoid arthritis -Continue methotrexate  weekly.  9.  Chronic constipation - Continue current bowel regimen of MiraLAX  twice daily, senna Colace twice daily, Senokot-S nightly.    10.  Generalized weakness - PT OT recommending SNF placement.    DVT prophylaxis: Lovenox  Code Status: Full Family Communication: Updated patient.  No family at bedside. Disposition: Patient medically stable.  SNF when bed available.  Status is: Inpatient Remains inpatient appropriate because: Severity of illness   Consultants:  None  Procedures:  Chest x-ray 07/05/2023   Antimicrobials:  Anti-infectives (From admission, onward)    Start     Dose/Rate Route Frequency Ordered Stop   07/05/23 2200  sulfamethoxazole -trimethoprim  (BACTRIM  DS) 800-160 MG per tablet 1 tablet        1 tablet Oral Every 12 hours 07/05/23 1734 07/11/23 2213   07/05/23 2100  azithromycin  (ZITHROMAX ) 500 mg in sodium chloride  0.9 % 250 mL IVPB        500 mg 250 mL/hr over 60 Minutes Intravenous Every 24 hours 07/05/23 1728  07/08/23 2154         Subjective: Sitting up in chair just returned from ambulating hallway with physical therapy.  Feels shortness of breath is at baseline.  Denies any chest pain.  Tolerating current diet.   Objective: Vitals:    07/13/23 0401 07/13/23 0812 07/13/23 0922 07/13/23 1146  BP: 129/68  (!) 160/62 (!) 142/57  Pulse: 83  89 83  Resp: 20     Temp: 98.6 F (37 C)     TempSrc:      SpO2: 92% 91%    Weight:      Height:        Intake/Output Summary (Last 24 hours) at 07/13/2023 1215 Last data filed at 07/13/2023 1100 Gross per 24 hour  Intake 630 ml  Output 1000 ml  Net -370 ml   Filed Weights   07/06/23 0428  Weight: 86.9 kg    Examination:  General exam: NAD. Respiratory system: Minimal expiratory wheezing.  No crackles.  Fair air movement.  Speaking in full sentences.  No use of accessory muscles of respiration.  Cardiovascular system: RRR no murmurs rubs or gallops.  No JVD.  No pitting lower extremity edema. Gastrointestinal system: Abdomen is soft, nontender, nondistended, positive bowel sounds.  No rebound.  No guarding.   Central nervous system: Alert and oriented.  Moving extremities spontaneously.  No focal neurological deficits. Extremities: Symmetric 5 x 5 power. Skin: No rashes, lesions or ulcers Psychiatry: Judgement and insight appear normal. Mood & affect appropriate.     Data Reviewed: I have personally reviewed following labs and imaging studies  CBC: Recent Labs  Lab 07/07/23 0544 07/08/23 0535 07/09/23 0513 07/10/23 0524 07/11/23 0456  WBC 9.6 10.9* 10.5 9.2 7.8  HGB 13.0 13.7 14.1 14.7 14.1  HCT 40.2 42.8 43.3 44.4 43.0  MCV 108.4* 108.1* 107.2* 106.0* 105.1*  PLT 157 180 187 182 187    Basic Metabolic Panel: Recent Labs  Lab 07/07/23 0544 07/08/23 0535 07/09/23 0513 07/10/23 0524 07/11/23 0456  NA 136 135 136 135 135  K 4.9 4.4 4.3 4.3 4.6  CL 93* 95* 96* 94* 95*  CO2 30 30 29 31 31   GLUCOSE 139* 137* 137* 124* 120*  BUN 14 17 16 14 15   CREATININE 0.61 0.69 0.65 0.82 0.99  CALCIUM  9.3 9.1 9.4 9.3 9.1    GFR: Estimated Creatinine Clearance: 68.7 mL/min (by C-G formula based on SCr of 0.99 mg/dL).  Liver Function Tests: Recent Labs  Lab  07/07/23 0544 07/08/23 0535 07/09/23 0513 07/10/23 0524  AST 20 14* 17 17  ALT 22 20 19 23   ALKPHOS 103 103 103 111  BILITOT 0.8 0.7 0.7 0.7  PROT 6.3* 6.1* 6.1* 6.3*  ALBUMIN 3.3* 3.3* 3.2* 3.4*    CBG: Recent Labs  Lab 07/12/23 1102 07/12/23 1610 07/12/23 2033 07/13/23 0725 07/13/23 1158  GLUCAP 110* 218* 135* 83 101*     No results found for this or any previous visit (from the past 240 hours).       Radiology Studies: No results found.      Scheduled Meds:  amLODipine   5 mg Oral Daily   atorvastatin   20 mg Oral Daily   budesonide -glycopyrrolate -formoterol   2 puff Inhalation BID   carbamide peroxide  5 drop Right EAR BID   docusate sodium   100 mg Oral BID   enoxaparin  (LOVENOX ) injection  40 mg Subcutaneous Q24H   furosemide   40 mg Oral Daily   insulin  aspart  0-15 Units  Subcutaneous TID WC   ipratropium-albuterol   3 mL Nebulization TID   neomycin -polymyxin-hydrocortisone  3 drop Right EAR Q6H   pantoprazole   40 mg Oral Daily   polyethylene glycol  17 g Oral BID   predniSONE   60 mg Oral QAC breakfast   senna-docusate  2 tablet Oral QHS   sertraline   100 mg Oral Daily   Continuous Infusions:   LOS: 8 days    Time spent: 35 mins    Toribio Hummer, MD [facility]   To contact the attending provider between 7A-7P or the covering provider during after hours 7P-7A, please log into the web site [email or web address] and access using universal [place] password for that web site. If you do not have the password, please call the hospital operator.  07/13/2023, 12:15 PM

## 2023-07-13 NOTE — TOC Progression Note (Signed)
 Transition of Care [facility]) - Progression Note    Patient Details  Name: [name] [name] MRN: [id number] Date of Birth: 1945/01/21  [facility] CM/SW Contact  Sonda Manuella Quill, RN Phone Number: 07/13/2023, 1:53 PM  Clinical Narrative:    Beat[place] w/ Tammy at [facility]; she says ins auth for SNF approved for 7 days, auth # [id number], and ins auth for ambulance, auth # [id number]; LVM for admissions director; awaiting return call; pt notified; LVM for dtr [facility] [phone]); Dr Sebastian notified via secure chat.   Expected Discharge Plan: Skilled Nursing Facility Barriers to Discharge: Insurance Authorization  Expected Discharge Plan and Services   Discharge Planning Services: CM Consult   Living arrangements for the past 2 months: Skilled Nursing Facility Expected Discharge Date: 07/11/23                                     Social Determinants of Health (SDOH) Interventions SDOH Screenings   Food Insecurity: No Food Insecurity (07/06/2023)  Housing: Low Risk  (07/06/2023)  Transportation Needs: No Transportation Needs (07/06/2023)  Utilities: Not At Risk (07/06/2023)  Depression (PHQ2-9): Low Risk  (03/09/2019)  Social Connections: Socially Isolated (07/06/2023)  Tobacco Use: Medium Risk (07/07/2023)    Readmission Risk Interventions    07/06/2023    4:46 PM 06/26/2023    2:14 PM 03/31/2023    3:55 PM  Readmission Risk Prevention Plan  Transportation Screening Complete Complete Complete  PCP or Specialist Appt within 3-5 Days Complete  Complete  HRI or Home Care Consult Complete  Complete  Social Work Consult for Recovery Care Planning/Counseling Complete  Complete  Palliative Care Screening Not Applicable  Not Applicable  Medication Review Oceanographer) Complete Complete Complete  PCP or Specialist appointment within 3-5 days of discharge  Complete   HRI or Home Care Consult  Complete   SW Recovery Care/Counseling Consult  Complete    Palliative Care Screening  Not Applicable   Skilled Nursing Facility  Complete

## 2023-07-14 DIAGNOSIS — J9621 Acute and chronic respiratory failure with hypoxia: Secondary | ICD-10-CM | POA: Diagnosis not present

## 2023-07-14 DIAGNOSIS — J189 Pneumonia, unspecified organism: Secondary | ICD-10-CM | POA: Diagnosis not present

## 2023-07-14 DIAGNOSIS — J9611 Chronic respiratory failure with hypoxia: Secondary | ICD-10-CM | POA: Diagnosis not present

## 2023-07-14 DIAGNOSIS — J441 Chronic obstructive pulmonary disease with (acute) exacerbation: Secondary | ICD-10-CM | POA: Diagnosis not present

## 2023-07-14 LAB — GLUCOSE, CAPILLARY
Glucose-Capillary: 77 mg/dL (ref 70–99)
Glucose-Capillary: 106 mg/dL — ABNORMAL HIGH (ref 70–99)
Glucose-Capillary: 185 mg/dL — ABNORMAL HIGH (ref 70–99)
Glucose-Capillary: 144 mg/dL — ABNORMAL HIGH (ref 70–99)
Glucose-Capillary: 139 mg/dL — ABNORMAL HIGH (ref 70–99)

## 2023-07-14 MED ORDER — OXYCODONE HCL 5 MG PO TABS
5.0000 mg | ORAL_TABLET | ORAL | Status: DC | PRN
  Filled 2023-07-14: qty 1

## 2023-07-14 NOTE — Progress Notes (Signed)
   07/14/23 0002  BiPAP/CPAP/SIPAP  Reason BIPAP/CPAP not in use  (bipap not in room)  BiPAP/CPAP /SiPAP Vitals  Resp 18  MEWS Score/Color  MEWS Score 0  MEWS Score Color [place]

## 2023-07-14 NOTE — Progress Notes (Signed)
 Mobility Specialist - Progress Note   07/14/23 1123  Mobility  Activity Ambulated with assistance in hallway  Level of Assistance Standby assist, set-up cues, supervision of patient - no hands on  Assistive Device Front wheel walker  Distance Ambulated (ft) 275 ft  Activity Response Tolerated well  Mobility Referral Yes  Mobility visit 1 Mobility  Mobility Specialist Start Time (ACUTE ONLY) 1105  Mobility Specialist Stop Time (ACUTE ONLY) 1123  Mobility Specialist Time Calculation (min) (ACUTE ONLY) 18 min   Pt received in bed and agreeable to mobility. During ambulation, pt desat to 87% on 6L. Took a standing rest break allowing SpO2 to come back up to 90%. Pt took x2 standing rest breaks d/t dyspnea with exertion. No complaints during session. Pt to bed after session with all needs met.    Pre-mobility: 99 HR, 99% SpO2 (4L [place] seated) During mobility: 112 HR, 87-90% SpO2 (6L [place]) Post-mobility: 97 HR, 90% SPO2 (4L )  Chief Technology Officer

## 2023-07-14 NOTE — Progress Notes (Signed)
 PROGRESS NOTE    [name] [name]  FMW:[id number] DOB: [birth year] DOA: 07/05/2023 PCP: Regino Slater, MD    Chief Complaint  Patient presents with   Shortness of Breath    Brief Narrative:  78 y.o. male with medical history significant for severe COPD, chronic hypoxemic respiratory failure on 4L O2, RA, HFpEF, CAD by imaging, HTN, HLD, and generalized anxiety disorder who presents from rehab facility via EMS for progressive shortness of breath and admitted for COPD exacerbation and acute on chronic respiratory failure with hypoxia    Assessment & Plan:   Principal Problem:   Acute on chronic respiratory failure with hypoxia (HCC) Active Problems:   COPD exacerbation (HCC)   Essential hypertension   Generalized anxiety disorder   Constipation   Chronic respiratory failure with hypoxia (HCC)   Coronary artery calcification seen on CT scan   Hyperlipidemia   Rheumatoid arthritis (HCC)   Multifocal pneumonia   Hyperglycemia   Chronic diastolic CHF (congestive heart failure) (HCC)  #1 acute on chronic respiratory failure with hypoxia secondary to acute severe COPD exacerbation -Patient with end-stage COPD presented with progressive shortness of breath, hypoxia worsening from baseline. - Patient noted to have wheezing poor air movement on auscultation required BiPAP briefly while in the ED due to increased work of breathing. - Chest x-ray with persistent infiltrate but slightly improved. - Patient clinical improvement. -Hypoxia improved currently at baseline with 3 L nasal cannula.  Patient noted with some subjective shortness of breath. - Was on IV Solu-Medrol  and transitioned to prednisone  taper. - Status post full course of azithromycin . - Continue Breztri , scheduled DuoNebs, PPI. - Supportive care. - Outpatient follow-up with primary pulmonologist on discharge.  2.  Recent history of multifocal pneumonia -Patient noted to have chest x-ray imaging from 616 with  multifocal pneumonia repeat chest x-ray on admission showing mild improvement. - Sputum cultures grew stenotrophomonas and patient noted to have been discharged home on 14 days of Bactrim . - Status post completion of Bactrim  on 07/11/2023.  3.  Hyperglycemia -Secondary to steroids. - SSI as needed.  4.  HFpEF -2D echo from 06/2022 with a EF of 65 to 70%, G1 DD no valvular abnormalities. - Currently euvolemic on examination and compensated. - Continue home regimen Lasix .  5.  Hypertension  - Norvasc , Lasix .   6.  CAD/hyperlipidemia -Stable. - Continue atorvastatin .  7.  Generalized anxiety disorder - Continue sertraline , Xanax  as needed.   8.  Rheumatoid arthritis -Continue methotrexate  weekly.  9.  Chronic constipation - Continue bowel regimen of MiraLAX  twice daily, Senokot-S nightly, Colace twice daily.   10.  Generalized weakness - PT/ OT recommending SNF placement.    DVT prophylaxis: Lovenox  Code Status: Full Family Communication: Updated patient.  No family at bedside. Disposition: Patient medically stable.  SNF when bed available.  Status is: Inpatient Remains inpatient appropriate because: Severity of illness   Consultants:  None  Procedures:  Chest x-ray 07/05/2023   Antimicrobials:  Anti-infectives (From admission, onward)    Start     Dose/Rate Route Frequency Ordered Stop   07/05/23 2200  sulfamethoxazole -trimethoprim  (BACTRIM  DS) 800-160 MG per tablet 1 tablet        1 tablet Oral Every 12 hours 07/05/23 1734 07/11/23 2213   07/05/23 2100  azithromycin  (ZITHROMAX ) 500 mg in sodium chloride  0.9 % 250 mL IVPB        500 mg 250 mL/hr over 60 Minutes Intravenous Every 24 hours 07/05/23 1728 07/08/23 2154  Subjective: Sitting up in recliner.  Noted to have been ambulating and working with physical therapy early on.  Denies any chest pain.  Shortness of breath at baseline.  Tolerating current diet.    Objective: Vitals:   07/14/23 0649  07/14/23 0735 07/14/23 1003 07/14/23 1034  BP:   (!) 150/63 (!) 150/63  Pulse:   87   Resp: 18     Temp:   (!) 97 F (36.1 C)   TempSrc:   Oral   SpO2:  91% 96%   Weight:      Height:        Intake/Output Summary (Last 24 hours) at 07/14/2023 1253 Last data filed at 07/14/2023 0854 Gross per 24 hour  Intake 600 ml  Output 300 ml  Net 300 ml   Filed Weights   07/06/23 0428  Weight: 86.9 kg    Examination:  General exam: NAD. Respiratory system: Lungs clear to auscultation bilaterally.  No wheezes, no crackles, no rhonchi.  Fair air movement.  Speaking in full sentences.   Cardiovascular system: Regular rate rhythm no murmurs rubs or gallops.  No JVD.  No pitting lower extremity edema.  Gastrointestinal system: Abdomen is soft, nontender, nondistended, positive bowel sounds.  No rebound.  No guarding.  Central nervous system: Alert and oriented.  Moving extremities spontaneously.  No focal neurological deficits. Extremities: Symmetric 5 x 5 power. Skin: No rashes, lesions or ulcers Psychiatry: Judgement and insight appear normal. Mood & affect appropriate.     Data Reviewed: I have personally reviewed following labs and imaging studies  CBC: Recent Labs  Lab 07/08/23 0535 07/09/23 0513 07/10/23 0524 07/11/23 0456  WBC 10.9* 10.5 9.2 7.8  HGB 13.7 14.1 14.7 14.1  HCT 42.8 43.3 44.4 43.0  MCV 108.1* 107.2* 106.0* 105.1*  PLT 180 187 182 187    Basic Metabolic Panel: Recent Labs  Lab 07/08/23 0535 07/09/23 0513 07/10/23 0524 07/11/23 0456  NA 135 136 135 135  K 4.4 4.3 4.3 4.6  CL 95* 96* 94* 95*  CO2 30 29 31 31   GLUCOSE 137* 137* 124* 120*  BUN 17 16 14 15   CREATININE 0.69 0.65 0.82 0.99  CALCIUM  9.1 9.4 9.3 9.1    GFR: Estimated Creatinine Clearance: 68.7 mL/min (by C-G formula based on SCr of 0.99 mg/dL).  Liver Function Tests: Recent Labs  Lab 07/08/23 0535 07/09/23 0513 07/10/23 0524  AST 14* 17 17  ALT 20 19 23   ALKPHOS 103 103 111   BILITOT 0.7 0.7 0.7  PROT 6.1* 6.1* 6.3*  ALBUMIN 3.3* 3.2* 3.4*    CBG: Recent Labs  Lab 07/13/23 1158 07/13/23 1658 07/13/23 2021 07/14/23 0745 07/14/23 1149  GLUCAP 101* 192* 173* 77 106*     No results found for this or any previous visit (from the past 240 hours).       Radiology Studies: No results found.      Scheduled Meds:  amLODipine   5 mg Oral Daily   atorvastatin   20 mg Oral Daily   budesonide -glycopyrrolate -formoterol   2 puff Inhalation BID   carbamide peroxide  5 drop Right EAR BID   docusate sodium   100 mg Oral BID   enoxaparin  (LOVENOX ) injection  40 mg Subcutaneous Q24H   furosemide   40 mg Oral Daily   insulin  aspart  0-15 Units Subcutaneous TID WC   ipratropium-albuterol   3 mL Nebulization TID   neomycin -polymyxin-hydrocortisone  3 drop Right EAR Q6H   pantoprazole   40 mg Oral Daily  polyethylene glycol  17 g Oral BID   senna-docusate  2 tablet Oral QHS   sertraline   100 mg Oral Daily   Continuous Infusions:   LOS: 9 days    Time spent: 35 mins    Toribio Hummer, MD [facility]   To contact the attending provider between 7A-7P or the covering provider during after hours 7P-7A, please log into the web site [email or web address] and access using universal [place] password for that web site. If you do not have the password, please call the hospital operator.  07/14/2023, 12:53 PM

## 2023-07-15 DIAGNOSIS — D849 Immunodeficiency, unspecified: Secondary | ICD-10-CM | POA: Diagnosis not present

## 2023-07-15 DIAGNOSIS — M069 Rheumatoid arthritis, unspecified: Secondary | ICD-10-CM | POA: Diagnosis not present

## 2023-07-15 DIAGNOSIS — J188 Other pneumonia, unspecified organism: Secondary | ICD-10-CM | POA: Diagnosis not present

## 2023-07-15 DIAGNOSIS — F411 Generalized anxiety disorder: Secondary | ICD-10-CM | POA: Diagnosis not present

## 2023-07-15 DIAGNOSIS — R2689 Other abnormalities of gait and mobility: Secondary | ICD-10-CM | POA: Diagnosis not present

## 2023-07-15 DIAGNOSIS — K219 Gastro-esophageal reflux disease without esophagitis: Secondary | ICD-10-CM | POA: Diagnosis not present

## 2023-07-15 DIAGNOSIS — J9610 Chronic respiratory failure, unspecified whether with hypoxia or hypercapnia: Secondary | ICD-10-CM | POA: Diagnosis not present

## 2023-07-15 DIAGNOSIS — I5030 Unspecified diastolic (congestive) heart failure: Secondary | ICD-10-CM | POA: Diagnosis not present

## 2023-07-15 DIAGNOSIS — I110 Hypertensive heart disease with heart failure: Secondary | ICD-10-CM | POA: Diagnosis not present

## 2023-07-15 DIAGNOSIS — J449 Chronic obstructive pulmonary disease, unspecified: Secondary | ICD-10-CM | POA: Diagnosis not present

## 2023-07-15 DIAGNOSIS — R2681 Unsteadiness on feet: Secondary | ICD-10-CM | POA: Diagnosis not present

## 2023-07-15 DIAGNOSIS — R41841 Cognitive communication deficit: Secondary | ICD-10-CM | POA: Diagnosis not present

## 2023-07-15 DIAGNOSIS — J9611 Chronic respiratory failure with hypoxia: Secondary | ICD-10-CM | POA: Diagnosis not present

## 2023-07-15 DIAGNOSIS — Z7952 Long term (current) use of systemic steroids: Secondary | ICD-10-CM | POA: Diagnosis not present

## 2023-07-15 DIAGNOSIS — J441 Chronic obstructive pulmonary disease with (acute) exacerbation: Secondary | ICD-10-CM | POA: Diagnosis not present

## 2023-07-15 DIAGNOSIS — M6281 Muscle weakness (generalized): Secondary | ICD-10-CM | POA: Diagnosis not present

## 2023-07-15 DIAGNOSIS — E099 Drug or chemical induced diabetes mellitus without complications: Secondary | ICD-10-CM | POA: Diagnosis not present

## 2023-07-15 DIAGNOSIS — Z9981 Dependence on supplemental oxygen: Secondary | ICD-10-CM | POA: Diagnosis not present

## 2023-07-15 DIAGNOSIS — I5032 Chronic diastolic (congestive) heart failure: Secondary | ICD-10-CM | POA: Diagnosis not present

## 2023-07-15 DIAGNOSIS — I959 Hypotension, unspecified: Secondary | ICD-10-CM | POA: Diagnosis not present

## 2023-07-15 DIAGNOSIS — N189 Chronic kidney disease, unspecified: Secondary | ICD-10-CM | POA: Diagnosis not present

## 2023-07-15 DIAGNOSIS — R278 Other lack of coordination: Secondary | ICD-10-CM | POA: Diagnosis not present

## 2023-07-15 DIAGNOSIS — I509 Heart failure, unspecified: Secondary | ICD-10-CM | POA: Diagnosis not present

## 2023-07-15 DIAGNOSIS — I10 Essential (primary) hypertension: Secondary | ICD-10-CM | POA: Diagnosis not present

## 2023-07-15 DIAGNOSIS — Z7401 Bed confinement status: Secondary | ICD-10-CM | POA: Diagnosis not present

## 2023-07-15 DIAGNOSIS — R739 Hyperglycemia, unspecified: Secondary | ICD-10-CM | POA: Diagnosis not present

## 2023-07-15 DIAGNOSIS — E785 Hyperlipidemia, unspecified: Secondary | ICD-10-CM | POA: Diagnosis not present

## 2023-07-15 DIAGNOSIS — J9621 Acute and chronic respiratory failure with hypoxia: Secondary | ICD-10-CM | POA: Diagnosis not present

## 2023-07-15 LAB — GLUCOSE, CAPILLARY
Glucose-Capillary: 88 mg/dL (ref 70–99)
Glucose-Capillary: 108 mg/dL — ABNORMAL HIGH (ref 70–99)
Glucose-Capillary: 149 mg/dL — ABNORMAL HIGH (ref 70–99)

## 2023-07-15 MED ORDER — PREDNISONE 20 MG PO TABS
40.0000 mg | ORAL_TABLET | Freq: Every day | ORAL | Status: DC
  Administered 2023-07-15: 40 mg via ORAL
  Filled 2023-07-15: qty 2

## 2023-07-15 MED ORDER — OXYCODONE HCL 5 MG PO TABS: 5.0000 mg | ORAL_TABLET | ORAL | 0 refills | Status: DC | PRN

## 2023-07-15 MED ORDER — PREDNISONE 20 MG PO TABS: ORAL_TABLET | ORAL | 0 refills | Status: AC

## 2023-07-15 NOTE — Discharge Summary (Signed)
 Physician Discharge Summary  [place] [facility] FMW:[id number] DOB: 1945/05/06 DOA: 07/05/2023  PCP: Regino Slater, MD  Admit date: 07/05/2023 Discharge date: 07/15/2023  Time spent: 60 minutes  Recommendations for Outpatient Follow-up:  Follow-up with MD at skilled nursing facility.  Patient will need a basic metabolic profile done in 1 week to follow-up on electrolytes and renal function.  Patient would likely benefit from outpatient palliative care referral. Follow-up with Dr. Jude, pulmonology in 1 to 2 weeks for further management of COPD and chronic hypoxic respiratory failure.Will need evaluation on follow-up to assess whether patient will benefit from nightly BiPAP.   Discharge Diagnoses:  Principal Problem:   Acute on chronic respiratory failure with hypoxia (HCC) Active Problems:   COPD exacerbation (HCC)   Essential hypertension   Generalized anxiety disorder   Constipation   Chronic respiratory failure with hypoxia (HCC)   Coronary artery calcification seen on CT scan   Hyperlipidemia   Rheumatoid arthritis (HCC)   Multifocal pneumonia   Hyperglycemia   Chronic diastolic CHF (congestive heart failure) (HCC)   Discharge Condition: Stable and improved.  Diet recommendation: Regular  Filed Weights   07/06/23 0428  Weight: 86.9 kg    History of present illness:  HPI per Dr.Amponsah [name] Gainey is a 78 y.o. 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 Patient was recently hospitalized for multifocal pneumonia and COPD exacerbation from 6/16 to 6/23 and discharged to rehab facility. Patient states he has had gradual worsening of his shortness of breath since discharge from the hospital.  His oxygen  levels do drop to the 70s while working with physical therapy.  He has significant been hypoxic  to the mid to high 80s with minimal exertion such as walking from his bedroom to the bathroom.  He endorsed a dry cough, chest tightness and some wheezing but denies any fevers, chills, abdominal pain, nausea, vomiting or dizziness.   On EMS arrival, patient was found to be very labored with SpO2 in the low 90s, down to 70s with exertion.  Patient was given 10 albuterol , 0.5 Atrovent, and 125 mg IV Solu-Medrol    ED Course: Initial vitals show temp 98.2, RR 38, HR 107, BP 165/68, SpO2 95% initially on BiPAP at 40% FiO2, transitioned to high flow [place] on 7 L [place] with SpO2 of 91%. Initial labs significant for WBC 8.1, normal kidney function, VBG shows pH 7.42, PCO248, PO278 and bicarb 31. EKG shows sinus tach. CXR shows persistent diffuse interstitial coarsening with unchanged asymmetric opacity in the left lung. Pt received IV mag 2 g x 1 and albuterol  neb.  Patient was placed on BiPAP briefly due to tachypnea and increased work of breathing.  He was transitioned to high flow nasal cannula and TRH was consulted for admission.    Hospital Course:  #1 acute on chronic respiratory failure with hypoxia secondary to acute severe COPD exacerbation -Patient with end-stage COPD presented with progressive shortness of breath, hypoxia worsening from baseline. - Patient noted to have wheezing poor air movement on auscultation required BiPAP briefly while in the ED due to increased work of breathing. - Chest x-ray with persistent infiltrate but slightly improved. - Patient was placed on IV Solu-Medrol , Breztri , scheduled DuoNebs, PPI with clinical improvement during the hospitalization with improvement of hypoxia back to baseline of 3 L nasal cannula. -Patient completed  a course of azithromycin  during the hospitalization. -Patient was discharged on a steroid taper. -Outpatient follow-up with primary pulmonologist.   2.  Recent history of multifocal pneumonia -Patient noted to have chest x-ray imaging from 616 with  multifocal pneumonia repeat chest x-ray on admission showing mild improvement. - Sputum cultures grew stenotrophomonas and patient noted to have been discharged home on 14 days of Bactrim . - Patient completed course of Bactrim  during the hospitalization.   -No further antibiotics needed on discharge.    3.  Hyperglycemia -Secondary to steroids. - Patient placed on SSI as needed.   4.  HFpEF -2D echo from 06/2022 with a EF of 65 to 70%, G1 DD no valvular abnormalities. - Patient noted to be euvolemic during the hospitalization and maintained on home regimen Lasix .     5.  Hypertension  - Controlled on home regimen Lasix , Norvasc .   6.  CAD/hyperlipidemia -Stable. - Patient maintained on home regimen atorvastatin .   7.  Generalized anxiety disorder - Patient maintained on home regimen sertraline , Xanax  as needed.   8.  Rheumatoid arthritis - Patient's methotrexate  was held and will be resumed postdischarge.    9.  Chronic constipation - Patient placed on bowel regimen of MiraLAX  twice daily, Colace twice daily and Senokot-S nightly with good results.   - Patient will be discharged on bowel regimen.    10.  Generalized weakness -Patient seen by PT /OT who are recommending SNF placement. - Patient will be discharged to SNF.      Procedures: Chest x-ray 07/05/2023  Consultations: None  Discharge Exam: Vitals:   07/15/23 1040 07/15/23 1146  BP:  (!) 140/60  Pulse:  86  Resp:  (!) 21  Temp:  97.8 F (36.6 C)  SpO2: 94% 95%    General: NAD Cardiovascular: RRR no murmurs rubs or gallops.  No JVD.  No lower extremity edema. Respiratory: Clear to auscultation bilaterally.  No wheezes, no crackles, no rhonchi.  Fair air movement.  Speaking in full sentences.  Discharge Instructions   Discharge Instructions     Diet general   Complete by: As directed    Increase activity slowly   Complete by: As directed    Pulmonary Visit   Complete by: As directed    HFU    Reason for referral: Other Pulmonary      Allergies as of 07/15/2023       Reactions   Fluticasone -salmeterol Shortness Of Breath, Other (See Comments)   Worsening shortness of breath..        Medication List     STOP taking these medications    sulfamethoxazole -trimethoprim  800-160 MG tablet Commonly known as: BACTRIM  DS       TAKE these medications    albuterol  108 (90 Base) MCG/ACT inhaler Commonly known as: VENTOLIN  HFA Inhale 2 puffs into the lungs every 6 (six) hours as needed for wheezing or shortness of breath.   ALPRAZolam  0.25 MG tablet Commonly known as: XANAX  Take 1 tablet (0.25 mg total) by mouth 3 (three) times daily as needed for anxiety.   amLODipine  5 MG tablet Commonly known as: NORVASC  Take 5 mg by mouth daily.   atorvastatin  20 MG tablet Commonly known as: LIPITOR Take 20 mg by mouth daily.   Breztri  Aerosphere 160-9-4.8 MCG/ACT Aero inhaler Generic drug: budesonide -glycopyrrolate -formoterol  Inhale 2 puffs into the lungs in the morning and at bedtime.   carbamide peroxide 6.5 % OTIC solution Commonly known as: DEBROX Place 5 drops into the right ear  2 (two) times daily for 7 days.   cyanocobalamin  1000 MCG tablet Commonly known as: VITAMIN B12 Take 1,000 mcg by mouth daily.   docusate sodium  100 MG capsule Commonly known as: COLACE Take 1 capsule (100 mg total) by mouth 2 (two) times daily.   folic acid  1 MG tablet Commonly known as: FOLVITE  Take 1 mg by mouth daily.   furosemide  40 MG tablet Commonly known as: LASIX  Take 40 mg by mouth in the morning.   ipratropium-albuterol  0.5-2.5 (3) MG/3ML Soln Commonly known as: DUONEB Take 3 mLs by nebulization 3 (three) times daily. What changed: when to take this   melatonin 3 MG Tabs tablet Take 1 tablet (3 mg total) by mouth at bedtime as needed.   methocarbamol  500 MG tablet Commonly known as: ROBAXIN  Take 1 tablet (500 mg total) by mouth every 8 (eight) hours as needed for  muscle spasms.   methotrexate  50 MG/2ML injection Inject 20 mg into the skin every Sunday.   multivitamin with minerals tablet Take 1 tablet by mouth daily with breakfast.   neomycin -polymyxin-hydrocortisone OTIC solution Commonly known as: CORTISPORIN  Place 3 drops into the right ear every 6 (six) hours for 7 days.   omeprazole 20 MG tablet Commonly known as: PRILOSEC OTC Take 20 mg by mouth daily before breakfast.   ondansetron  4 MG tablet Commonly known as: ZOFRAN  Take 1 tablet (4 mg total) by mouth every 6 (six) hours as needed for nausea.   oxyCODONE  5 MG immediate release tablet Commonly known as: Oxy IR/ROXICODONE  Take 1 tablet (5 mg total) by mouth every 4 (four) hours as needed for moderate pain (pain score 4-6).   OXYGEN  Inhale 4 L/min into the lungs continuous.   polyethylene glycol 17 g packet Commonly known as: MIRALAX  / GLYCOLAX  Take 17 g by mouth 2 (two) times daily. What changed:  when to take this reasons to take this   predniSONE  20 MG tablet Commonly known as: DELTASONE  Take 2 tablets (40 mg total) by mouth daily before breakfast for 3 days, THEN 1 tablet (20 mg total) daily before breakfast for 3 days. Start taking on: July 15, 2023 What changed:  medication strength See the new instructions.   senna-docusate 8.6-50 MG tablet Commonly known as: Senokot-S Take 2 tablets by mouth at bedtime.   sertraline  100 MG tablet Commonly known as: ZOLOFT  Take 100 mg by mouth daily.   TYLENOL  500 MG tablet Generic drug: acetaminophen  Take 500 mg by mouth every 8 (eight) hours as needed for mild pain (pain score 1-3) or headache.       Allergies  Allergen Reactions   Fluticasone -Salmeterol Shortness Of Breath and Other (See Comments)    Worsening shortness of breath..    Follow-up Information     MD AT SNF Follow up.          Jude Harden GAILS, MD. Schedule an appointment as soon as possible for a visit in 2 week(s).   Specialty: Pulmonary  Disease Why: Follow-up in 1 to 2 weeks Contact information: [address] [place] [address] [phone]                  The results of significant diagnostics from this hospitalization (including imaging, microbiology, ancillary and laboratory) are listed below for reference.    Significant Diagnostic Studies: DG Chest Port 1 View Result Date: 07/05/2023 CLINICAL DATA:  Shortness of breath. EXAM: PORTABLE CHEST 1 VIEW COMPARISON:  06/24/2023. FINDINGS: Stable cardiomediastinal silhouette. Persistent diffuse interstitial coarsening  with emphysema. Similar asymmetric patchy opacity in the left lung. No pleural effusion or pneumothorax. No acute osseous abnormality. IMPRESSION: Persistent diffuse interstitial coarsening with similar asymmetric opacity in the left lung, concerning for superimposed infectious/inflammatory process. Electronically Signed   By: Harrietta Sherry M.D.   On: 07/05/2023 15:57   DG Chest Port 1 View Result Date: 06/24/2023 EXAM: 1 VIEW XRAY OF THE CHEST 06/24/2023 07:45:00 PM COMPARISON: CT chest dated 06/18/2023. CLINICAL HISTORY: SOB hypoxia. SOB/hypoxia - Pt presents to ED from home via EMS for SOB for over a month. Hx of COPD pt states is worsening. Pt has increased baseline oxygen  from 3L to 5L. Pt was 80% on 5L when EMS arrived- placed on 10L NRB. FINDINGS: LUNGS AND PLEURA: Coarse interstitial markings and emphysematous changes. Superimposed patchy opacities in the left mid lung and right lower lung, suggesting multifocal pneumonia. No pleural effusion. No pneumothorax. HEART AND MEDIASTINUM: No acute abnormality of the cardiac and mediastinal silhouettes. Thoracic aortic atherosclerosis. BONES AND SOFT TISSUES: No acute osseous abnormality. IMPRESSION: 1. Scattered patchy opacities, suspicious for multifocal pneumonia. Electronically signed by: Pinkie Pebbles MD 06/24/2023 07:50 PM EDT RP Workstation: [id number]   CT Angio Chest W/Cm &/Or Wo  Cm Result Date: 06/18/2023 CLINICAL DATA:  Hypoxia shortness of breath EXAM: CT ANGIOGRAPHY CHEST WITH CONTRAST TECHNIQUE: Multidetector CT imaging of the chest was performed using the standard protocol during bolus administration of intravenous contrast. Multiplanar CT image reconstructions and MIPs were obtained to evaluate the vascular anatomy. RADIATION DOSE REDUCTION: This exam was performed according to the departmental dose-optimization program which includes automated exposure control, adjustment of the mA and/or kV according to patient size and/or use of iterative reconstruction technique. CONTRAST:  75mL OMNIPAQUE  IOHEXOL  350 MG/ML SOLN COMPARISON:  Chest x-ray 06/10/2023, CT chest 03/22/2023, 10/04/2022 FINDINGS: Cardiovascular: Mild aortic atherosclerosis. No aneurysm. Coronary vascular calcification. Normal cardiac size. No pericardial effusion Mediastinum/Nodes: Patent trachea. No thyroid  mass. No suspicious lymph nodes. Esophagus within normal limits Lungs/Pleura: Advanced emphysema. No acute airspace disease, pleural effusion or pneumothorax. Mild bronchiectasis in the right upper lobe and probable scarring. Stable 4 mm right middle lobe subpleural nodule on series 6, image 84, no specific imaging follow-up is recommended. Upper Abdomen: No acute finding Musculoskeletal: Interval moderate compression fracture at T7, involves the inferior endplate. Review of the MIP images confirms the above findings. IMPRESSION: 1. Negative for acute pulmonary embolus or aortic dissection. 2. Advanced emphysema. No acute airspace disease. 3. Interval moderate compression fracture at T7. 4. Aortic atherosclerosis. Aortic Atherosclerosis (ICD10-I70.0) and Emphysema (ICD10-J43.9). Electronically Signed   By: Luke Bun M.D.   On: 06/18/2023 16:39    Microbiology: No results found for this or any previous visit (from the past 240 hours).   Labs: Basic Metabolic Panel: Recent Labs  Lab 07/09/23 0513  07/10/23 0524 07/11/23 0456  NA 136 135 135  K 4.3 4.3 4.6  CL 96* 94* 95*  CO2 29 31 31   GLUCOSE 137* 124* 120*  BUN 16 14 15   CREATININE 0.65 0.82 0.99  CALCIUM  9.4 9.3 9.1   Liver Function Tests: Recent Labs  Lab 07/09/23 0513 07/10/23 0524  AST 17 17  ALT 19 23  ALKPHOS 103 111  BILITOT 0.7 0.7  PROT 6.1* 6.3*  ALBUMIN 3.2* 3.4*   No results for input(s): LIPASE, AMYLASE in the last 168 hours. No results for input(s): AMMONIA in the last 168 hours. CBC: Recent Labs  Lab 07/09/23 0513 07/10/23 0524 07/11/23 0456  WBC 10.5 9.2  7.8  HGB 14.1 14.7 14.1  HCT 43.3 44.4 43.0  MCV 107.2* 106.0* 105.1*  PLT 187 182 187   Cardiac Enzymes: No results for input(s): CKTOTAL, CKMB, CKMBINDEX, TROPONINI in the last 168 hours. BNP: BNP (last 3 results) Recent Labs    03/29/23 1742 04/02/23 1043 06/24/23 1930  BNP 136.4* 99.7 35.6    ProBNP (last 3 results) No results for input(s): PROBNP in the last 8760 hours.  CBG: Recent Labs  Lab 07/14/23 1657 07/14/23 2014 07/14/23 2047 07/15/23 0723 07/15/23 1145  GLUCAP 185* 144* 139* 88 108*       Signed:  Toribio Hummer MD.  [facility] 07/15/2023, 1:22 PM

## 2023-07-15 NOTE — TOC Progression Note (Addendum)
 Transition of Care [facility]) - Progression Note    Patient Details  Name: [name] [name] MRN: [id number] Date of Birth: 1945/09/26  Transition of Care [facility]) CM/SW Contact  Barabara Motz, Nathanel, RN Phone Number: 07/15/2023, 9:42 AM  Clinical Narrative:  Per prior notes we have auth-if stable I will check for bed availability with [address]. MD updated.  -9:52a Per [address] [address] rep bed available today , not tomorrow. MD updated.    Expected Discharge Plan: Skilled Nursing Facility Barriers to Discharge: Continued Medical Work up  Expected Discharge Plan and Services   Discharge Planning Services: CM Consult   Living arrangements for the past 2 months: Skilled Nursing Facility Expected Discharge Date: 07/11/23                                     Social Determinants of Health (SDOH) Interventions SDOH Screenings   Food Insecurity: No Food Insecurity (07/06/2023)  Housing: Low Risk  (07/06/2023)  Transportation Needs: No Transportation Needs (07/06/2023)  Utilities: Not At Risk (07/06/2023)  Depression (PHQ2-9): Low Risk  (03/09/2019)  Social Connections: Socially Isolated (07/06/2023)  Tobacco Use: Medium Risk (07/07/2023)    Readmission Risk Interventions    07/06/2023    4:46 PM 06/26/2023    2:14 PM 03/31/2023    3:55 PM  Readmission Risk Prevention Plan  Transportation Screening Complete Complete Complete  PCP or Specialist Appt within 3-5 Days Complete  Complete  HRI or Home Care Consult Complete  Complete  Social Work Consult for Recovery Care Planning/Counseling Complete  Complete  Palliative Care Screening Not Applicable  Not Applicable  Medication Review Oceanographer) Complete Complete Complete  PCP or Specialist appointment within 3-5 days of discharge  Complete   HRI or Home Care Consult  Complete   SW Recovery Care/Counseling Consult  Complete   Palliative Care Screening  Not Applicable   Skilled Nursing Facility  Complete

## 2023-07-15 NOTE — Progress Notes (Signed)
 Mobility Specialist - Progress Note   07/15/23 0932  Oxygen  Therapy  SpO2 (!) 88 %  O2 Device Nasal Cannula  O2 Flow Rate (L/min) 4 L/min  Patient Activity (if Appropriate) Ambulating  Mobility  Activity Ambulated with assistance in hallway  Level of Assistance Standby assist, set-up cues, supervision of patient - no hands on  Assistive Device Front wheel walker  Distance Ambulated (ft) 250 ft  Activity Response Tolerated well  Mobility Referral Yes  Mobility visit 1 Mobility  Mobility Specialist Start Time (ACUTE ONLY) [id number]  Mobility Specialist Stop Time (ACUTE ONLY) 0930  Mobility Specialist Time Calculation (min) (ACUTE ONLY) 21 min   Pt received in bed and agreeable to mobility. During ambulation, pt desat to 88%. Per nurse approval, bumped pt up to 6L. SpO2 to 91%. Pt took x2 standing rest breaks d/t SOB. Dyspnea with exertion. No complaints during session. Pt to recliner after session with all needs met.    Pre-mobility: 94%  SpO2 (4L [place]) During mobility: 88-91% SpO2 (4L-6L [place]) Post-mobility: 990% SPO2 (4L [place])  Chief Technology Officer

## 2023-07-15 NOTE — TOC Transition Note (Signed)
 Transition of Care [facility]) - Discharge Note   Patient Details  Name: YVAN DORITY MRN: [id number] Date of Birth: [birth year]  Transition of Care [facility]) CM/SW Contact:  Bascom Service, RN Phone Number: 07/15/2023, 1:30 PM   Clinical Narrative: d/c summary sent to [address] rep Darrian accepted-going ro rm#[id number], report #[phone]. [facility] called. No further CM needs.      Final next level of care: Skilled Nursing Facility Barriers to Discharge: Continued Medical Work up   Patient Goals and CMS Choice Patient states their goals for this hospitalization and ongoing recovery are:: SNF CMS Medicare.gov Compare Post Acute Care list provided to:: Patient   [place] ownership interest in [facility].provided to:: Patient    Discharge Placement              Patient chooses bed at: [facility] Patient to be transferred to facility by: [facility] Name of family member notified: Patient Patient and family notified of of transfer: 07/15/23  Discharge Plan and Services Additional resources added to the After Visit Summary for     Discharge Planning Services: CM Consult                                 Social Drivers of Health (SDOH) Interventions SDOH Screenings   Food Insecurity: No Food Insecurity (07/06/2023)  Housing: Low Risk  (07/06/2023)  Transportation Needs: No Transportation Needs (07/06/2023)  Utilities: Not At Risk (07/06/2023)  Depression (PHQ2-9): Low Risk  (03/09/2019)  Social Connections: Socially Isolated (07/06/2023)  Tobacco Use: Medium Risk (07/07/2023)     Readmission Risk Interventions    07/06/2023    4:46 PM 06/26/2023    2:14 PM 03/31/2023    3:55 PM  Readmission Risk Prevention Plan  Transportation Screening Complete Complete Complete  PCP or Specialist Appt within 3-5 Days Complete  Complete  HRI or Home Care Consult Complete  Complete  Social Work Consult for Recovery Care Planning/Counseling Complete  Complete  Palliative Care  Screening Not Applicable  Not Applicable  Medication Review Oceanographer) Complete Complete Complete  PCP or Specialist appointment within 3-5 days of discharge  Complete   HRI or Home Care Consult  Complete   SW Recovery Care/Counseling Consult  Complete   Palliative Care Screening  Not Applicable   Skilled Nursing Facility  Complete

## 2023-07-16 DIAGNOSIS — M069 Rheumatoid arthritis, unspecified: Secondary | ICD-10-CM | POA: Diagnosis not present

## 2023-07-16 DIAGNOSIS — I10 Essential (primary) hypertension: Secondary | ICD-10-CM | POA: Diagnosis not present

## 2023-07-16 DIAGNOSIS — R2689 Other abnormalities of gait and mobility: Secondary | ICD-10-CM | POA: Diagnosis not present

## 2023-07-16 DIAGNOSIS — J9611 Chronic respiratory failure with hypoxia: Secondary | ICD-10-CM | POA: Diagnosis not present

## 2023-07-16 DIAGNOSIS — M6281 Muscle weakness (generalized): Secondary | ICD-10-CM | POA: Diagnosis not present

## 2023-07-16 DIAGNOSIS — Z7952 Long term (current) use of systemic steroids: Secondary | ICD-10-CM | POA: Diagnosis not present

## 2023-07-16 DIAGNOSIS — I5032 Chronic diastolic (congestive) heart failure: Secondary | ICD-10-CM | POA: Diagnosis not present

## 2023-07-16 DIAGNOSIS — D849 Immunodeficiency, unspecified: Secondary | ICD-10-CM | POA: Diagnosis not present

## 2023-07-16 DIAGNOSIS — J9621 Acute and chronic respiratory failure with hypoxia: Secondary | ICD-10-CM | POA: Diagnosis not present

## 2023-07-16 DIAGNOSIS — J441 Chronic obstructive pulmonary disease with (acute) exacerbation: Secondary | ICD-10-CM | POA: Diagnosis not present

## 2023-07-16 DIAGNOSIS — I5030 Unspecified diastolic (congestive) heart failure: Secondary | ICD-10-CM | POA: Diagnosis not present

## 2023-07-16 DIAGNOSIS — F411 Generalized anxiety disorder: Secondary | ICD-10-CM | POA: Diagnosis not present

## 2023-07-18 DIAGNOSIS — F411 Generalized anxiety disorder: Secondary | ICD-10-CM | POA: Diagnosis not present

## 2023-07-18 DIAGNOSIS — I10 Essential (primary) hypertension: Secondary | ICD-10-CM | POA: Diagnosis not present

## 2023-07-18 DIAGNOSIS — M069 Rheumatoid arthritis, unspecified: Secondary | ICD-10-CM | POA: Diagnosis not present

## 2023-07-18 DIAGNOSIS — Z7952 Long term (current) use of systemic steroids: Secondary | ICD-10-CM | POA: Diagnosis not present

## 2023-07-18 DIAGNOSIS — J9621 Acute and chronic respiratory failure with hypoxia: Secondary | ICD-10-CM | POA: Diagnosis not present

## 2023-07-18 DIAGNOSIS — I5030 Unspecified diastolic (congestive) heart failure: Secondary | ICD-10-CM | POA: Diagnosis not present

## 2023-07-18 DIAGNOSIS — J441 Chronic obstructive pulmonary disease with (acute) exacerbation: Secondary | ICD-10-CM | POA: Diagnosis not present

## 2023-07-19 DIAGNOSIS — M6281 Muscle weakness (generalized): Secondary | ICD-10-CM | POA: Diagnosis not present

## 2023-07-19 DIAGNOSIS — D849 Immunodeficiency, unspecified: Secondary | ICD-10-CM | POA: Diagnosis not present

## 2023-07-19 DIAGNOSIS — I5032 Chronic diastolic (congestive) heart failure: Secondary | ICD-10-CM | POA: Diagnosis not present

## 2023-07-19 DIAGNOSIS — J9611 Chronic respiratory failure with hypoxia: Secondary | ICD-10-CM | POA: Diagnosis not present

## 2023-07-19 DIAGNOSIS — F411 Generalized anxiety disorder: Secondary | ICD-10-CM | POA: Diagnosis not present

## 2023-07-19 DIAGNOSIS — J441 Chronic obstructive pulmonary disease with (acute) exacerbation: Secondary | ICD-10-CM | POA: Diagnosis not present

## 2023-07-19 DIAGNOSIS — R2689 Other abnormalities of gait and mobility: Secondary | ICD-10-CM | POA: Diagnosis not present

## 2023-07-19 DIAGNOSIS — M069 Rheumatoid arthritis, unspecified: Secondary | ICD-10-CM | POA: Diagnosis not present

## 2023-07-22 DIAGNOSIS — I5030 Unspecified diastolic (congestive) heart failure: Secondary | ICD-10-CM | POA: Diagnosis not present

## 2023-07-22 DIAGNOSIS — J449 Chronic obstructive pulmonary disease, unspecified: Secondary | ICD-10-CM | POA: Diagnosis not present

## 2023-07-22 DIAGNOSIS — M069 Rheumatoid arthritis, unspecified: Secondary | ICD-10-CM | POA: Diagnosis not present

## 2023-07-22 DIAGNOSIS — F411 Generalized anxiety disorder: Secondary | ICD-10-CM | POA: Diagnosis not present

## 2023-07-22 DIAGNOSIS — I10 Essential (primary) hypertension: Secondary | ICD-10-CM | POA: Diagnosis not present

## 2023-07-22 DIAGNOSIS — J188 Other pneumonia, unspecified organism: Secondary | ICD-10-CM | POA: Diagnosis not present

## 2023-07-23 DIAGNOSIS — R2689 Other abnormalities of gait and mobility: Secondary | ICD-10-CM | POA: Diagnosis not present

## 2023-07-23 DIAGNOSIS — J441 Chronic obstructive pulmonary disease with (acute) exacerbation: Secondary | ICD-10-CM | POA: Diagnosis not present

## 2023-07-23 DIAGNOSIS — M6281 Muscle weakness (generalized): Secondary | ICD-10-CM | POA: Diagnosis not present

## 2023-07-23 DIAGNOSIS — D849 Immunodeficiency, unspecified: Secondary | ICD-10-CM | POA: Diagnosis not present

## 2023-07-23 DIAGNOSIS — F411 Generalized anxiety disorder: Secondary | ICD-10-CM | POA: Diagnosis not present

## 2023-07-23 DIAGNOSIS — M069 Rheumatoid arthritis, unspecified: Secondary | ICD-10-CM | POA: Diagnosis not present

## 2023-07-23 DIAGNOSIS — J9611 Chronic respiratory failure with hypoxia: Secondary | ICD-10-CM | POA: Diagnosis not present

## 2023-07-23 DIAGNOSIS — I5032 Chronic diastolic (congestive) heart failure: Secondary | ICD-10-CM | POA: Diagnosis not present

## 2023-07-24 DIAGNOSIS — E099 Drug or chemical induced diabetes mellitus without complications: Secondary | ICD-10-CM | POA: Diagnosis not present

## 2023-07-24 DIAGNOSIS — J188 Other pneumonia, unspecified organism: Secondary | ICD-10-CM | POA: Diagnosis not present

## 2023-07-24 DIAGNOSIS — Z7952 Long term (current) use of systemic steroids: Secondary | ICD-10-CM | POA: Diagnosis not present

## 2023-07-24 DIAGNOSIS — N189 Chronic kidney disease, unspecified: Secondary | ICD-10-CM | POA: Diagnosis not present

## 2023-07-24 DIAGNOSIS — M069 Rheumatoid arthritis, unspecified: Secondary | ICD-10-CM | POA: Diagnosis not present

## 2023-07-24 DIAGNOSIS — J9610 Chronic respiratory failure, unspecified whether with hypoxia or hypercapnia: Secondary | ICD-10-CM | POA: Diagnosis not present

## 2023-07-24 DIAGNOSIS — E785 Hyperlipidemia, unspecified: Secondary | ICD-10-CM | POA: Diagnosis not present

## 2023-07-24 DIAGNOSIS — J441 Chronic obstructive pulmonary disease with (acute) exacerbation: Secondary | ICD-10-CM | POA: Diagnosis not present

## 2023-07-24 DIAGNOSIS — F411 Generalized anxiety disorder: Secondary | ICD-10-CM | POA: Diagnosis not present

## 2023-07-25 DIAGNOSIS — I110 Hypertensive heart disease with heart failure: Secondary | ICD-10-CM | POA: Diagnosis not present

## 2023-07-25 DIAGNOSIS — J441 Chronic obstructive pulmonary disease with (acute) exacerbation: Secondary | ICD-10-CM | POA: Diagnosis not present

## 2023-07-25 DIAGNOSIS — M069 Rheumatoid arthritis, unspecified: Secondary | ICD-10-CM | POA: Diagnosis not present

## 2023-07-25 DIAGNOSIS — Z9981 Dependence on supplemental oxygen: Secondary | ICD-10-CM | POA: Diagnosis not present

## 2023-07-26 DIAGNOSIS — J441 Chronic obstructive pulmonary disease with (acute) exacerbation: Secondary | ICD-10-CM | POA: Diagnosis not present

## 2023-07-26 DIAGNOSIS — D849 Immunodeficiency, unspecified: Secondary | ICD-10-CM | POA: Diagnosis not present

## 2023-07-26 DIAGNOSIS — J9611 Chronic respiratory failure with hypoxia: Secondary | ICD-10-CM | POA: Diagnosis not present

## 2023-07-26 DIAGNOSIS — I509 Heart failure, unspecified: Secondary | ICD-10-CM | POA: Diagnosis not present

## 2023-07-26 DIAGNOSIS — R2689 Other abnormalities of gait and mobility: Secondary | ICD-10-CM | POA: Diagnosis not present

## 2023-07-26 DIAGNOSIS — M6281 Muscle weakness (generalized): Secondary | ICD-10-CM | POA: Diagnosis not present

## 2023-07-26 DIAGNOSIS — F411 Generalized anxiety disorder: Secondary | ICD-10-CM | POA: Diagnosis not present

## 2023-07-26 DIAGNOSIS — Z7952 Long term (current) use of systemic steroids: Secondary | ICD-10-CM | POA: Diagnosis not present

## 2023-07-26 DIAGNOSIS — J188 Other pneumonia, unspecified organism: Secondary | ICD-10-CM | POA: Diagnosis not present

## 2023-07-26 DIAGNOSIS — I5032 Chronic diastolic (congestive) heart failure: Secondary | ICD-10-CM | POA: Diagnosis not present

## 2023-07-26 DIAGNOSIS — M069 Rheumatoid arthritis, unspecified: Secondary | ICD-10-CM | POA: Diagnosis not present

## 2023-07-29 DIAGNOSIS — Z87891 Personal history of nicotine dependence: Secondary | ICD-10-CM | POA: Diagnosis not present

## 2023-07-29 DIAGNOSIS — Z8701 Personal history of pneumonia (recurrent): Secondary | ICD-10-CM | POA: Diagnosis not present

## 2023-07-29 DIAGNOSIS — Z604 Social exclusion and rejection: Secondary | ICD-10-CM | POA: Diagnosis not present

## 2023-07-29 DIAGNOSIS — I5033 Acute on chronic diastolic (congestive) heart failure: Secondary | ICD-10-CM | POA: Diagnosis not present

## 2023-07-29 DIAGNOSIS — F1020 Alcohol dependence, uncomplicated: Secondary | ICD-10-CM | POA: Diagnosis not present

## 2023-07-29 DIAGNOSIS — K219 Gastro-esophageal reflux disease without esophagitis: Secondary | ICD-10-CM | POA: Diagnosis not present

## 2023-07-29 DIAGNOSIS — S2242XD Multiple fractures of ribs, left side, subsequent encounter for fracture with routine healing: Secondary | ICD-10-CM | POA: Diagnosis not present

## 2023-07-29 DIAGNOSIS — J9611 Chronic respiratory failure with hypoxia: Secondary | ICD-10-CM | POA: Diagnosis not present

## 2023-07-29 DIAGNOSIS — F411 Generalized anxiety disorder: Secondary | ICD-10-CM | POA: Diagnosis not present

## 2023-07-29 DIAGNOSIS — Z9981 Dependence on supplemental oxygen: Secondary | ICD-10-CM | POA: Diagnosis not present

## 2023-07-29 DIAGNOSIS — F410 Panic disorder [episodic paroxysmal anxiety] without agoraphobia: Secondary | ICD-10-CM | POA: Diagnosis not present

## 2023-07-29 DIAGNOSIS — D649 Anemia, unspecified: Secondary | ICD-10-CM | POA: Diagnosis not present

## 2023-07-29 DIAGNOSIS — M069 Rheumatoid arthritis, unspecified: Secondary | ICD-10-CM | POA: Diagnosis not present

## 2023-07-29 DIAGNOSIS — J189 Pneumonia, unspecified organism: Secondary | ICD-10-CM | POA: Diagnosis not present

## 2023-07-29 DIAGNOSIS — Z7951 Long term (current) use of inhaled steroids: Secondary | ICD-10-CM | POA: Diagnosis not present

## 2023-07-29 DIAGNOSIS — E7800 Pure hypercholesterolemia, unspecified: Secondary | ICD-10-CM | POA: Diagnosis not present

## 2023-07-29 DIAGNOSIS — J440 Chronic obstructive pulmonary disease with acute lower respiratory infection: Secondary | ICD-10-CM | POA: Diagnosis not present

## 2023-07-29 DIAGNOSIS — I110 Hypertensive heart disease with heart failure: Secondary | ICD-10-CM | POA: Diagnosis not present

## 2023-07-29 DIAGNOSIS — I739 Peripheral vascular disease, unspecified: Secondary | ICD-10-CM | POA: Diagnosis not present

## 2023-07-29 DIAGNOSIS — Z9181 History of falling: Secondary | ICD-10-CM | POA: Diagnosis not present

## 2023-07-29 DIAGNOSIS — I2510 Atherosclerotic heart disease of native coronary artery without angina pectoris: Secondary | ICD-10-CM | POA: Diagnosis not present

## 2023-07-29 DIAGNOSIS — J439 Emphysema, unspecified: Secondary | ICD-10-CM | POA: Diagnosis not present

## 2023-07-30 DIAGNOSIS — J439 Emphysema, unspecified: Secondary | ICD-10-CM | POA: Diagnosis not present

## 2023-07-30 DIAGNOSIS — J449 Chronic obstructive pulmonary disease, unspecified: Secondary | ICD-10-CM | POA: Diagnosis not present

## 2023-07-30 DIAGNOSIS — J9611 Chronic respiratory failure with hypoxia: Secondary | ICD-10-CM | POA: Diagnosis not present

## 2023-08-06 DIAGNOSIS — F419 Anxiety disorder, unspecified: Secondary | ICD-10-CM | POA: Diagnosis not present

## 2023-08-06 DIAGNOSIS — I441 Atrioventricular block, second degree: Secondary | ICD-10-CM | POA: Diagnosis not present

## 2023-08-06 DIAGNOSIS — J9611 Chronic respiratory failure with hypoxia: Secondary | ICD-10-CM | POA: Diagnosis not present

## 2023-08-06 DIAGNOSIS — Z79899 Other long term (current) drug therapy: Secondary | ICD-10-CM | POA: Diagnosis not present

## 2023-08-12 DIAGNOSIS — J449 Chronic obstructive pulmonary disease, unspecified: Secondary | ICD-10-CM | POA: Diagnosis not present

## 2023-08-13 DIAGNOSIS — I441 Atrioventricular block, second degree: Secondary | ICD-10-CM | POA: Diagnosis not present

## 2023-08-14 ENCOUNTER — Other Ambulatory Visit: Payer: Self-pay

## 2023-08-14 MED ORDER — BREZTRI AEROSPHERE 160-9-4.8 MCG/ACT IN AERO: 2.0000 | INHALATION_SPRAY | Freq: Two times a day (BID) | RESPIRATORY_TRACT | 8 refills | Status: DC

## 2023-08-14 NOTE — Progress Notes (Signed)
 RX printed for [facility]  to fax with a paper I received from [employer] requesting a new RX.

## 2023-08-20 ENCOUNTER — Other Ambulatory Visit (HOSPITAL_BASED_OUTPATIENT_CLINIC_OR_DEPARTMENT_OTHER): Payer: Self-pay

## 2023-08-20 DIAGNOSIS — J441 Chronic obstructive pulmonary disease with (acute) exacerbation: Secondary | ICD-10-CM | POA: Diagnosis not present

## 2023-08-20 MED ORDER — BREZTRI AEROSPHERE 160-9-4.8 MCG/ACT IN AERO: 2.0000 | INHALATION_SPRAY | Freq: Two times a day (BID) | RESPIRATORY_TRACT | 8 refills | Status: AC

## 2023-08-20 MED ORDER — BREZTRI AEROSPHERE 160-9-4.8 MCG/ACT IN AERO
2.0000 | INHALATION_SPRAY | Freq: Two times a day (BID) | RESPIRATORY_TRACT | 8 refills | Status: DC
Start: 2023-08-20 — End: 2023-08-20

## 2023-08-27 DIAGNOSIS — J441 Chronic obstructive pulmonary disease with (acute) exacerbation: Secondary | ICD-10-CM | POA: Diagnosis not present

## 2023-08-30 DIAGNOSIS — H43393 Other vitreous opacities, bilateral: Secondary | ICD-10-CM | POA: Diagnosis not present

## 2023-09-01 DIAGNOSIS — J9611 Chronic respiratory failure with hypoxia: Secondary | ICD-10-CM | POA: Diagnosis not present

## 2023-09-01 DIAGNOSIS — J439 Emphysema, unspecified: Secondary | ICD-10-CM | POA: Diagnosis not present

## 2023-09-03 DIAGNOSIS — L814 Other melanin hyperpigmentation: Secondary | ICD-10-CM | POA: Diagnosis not present

## 2023-09-03 DIAGNOSIS — L859 Epidermal thickening, unspecified: Secondary | ICD-10-CM | POA: Diagnosis not present

## 2023-09-03 DIAGNOSIS — Z86018 Personal history of other benign neoplasm: Secondary | ICD-10-CM | POA: Diagnosis not present

## 2023-09-03 DIAGNOSIS — L821 Other seborrheic keratosis: Secondary | ICD-10-CM | POA: Diagnosis not present

## 2023-09-03 DIAGNOSIS — L720 Epidermal cyst: Secondary | ICD-10-CM | POA: Diagnosis not present

## 2023-09-03 DIAGNOSIS — Z85828 Personal history of other malignant neoplasm of skin: Secondary | ICD-10-CM | POA: Diagnosis not present

## 2023-09-03 DIAGNOSIS — I8311 Varicose veins of right lower extremity with inflammation: Secondary | ICD-10-CM | POA: Diagnosis not present

## 2023-09-03 DIAGNOSIS — J441 Chronic obstructive pulmonary disease with (acute) exacerbation: Secondary | ICD-10-CM | POA: Diagnosis not present

## 2023-09-03 DIAGNOSIS — D225 Melanocytic nevi of trunk: Secondary | ICD-10-CM | POA: Diagnosis not present

## 2023-09-03 DIAGNOSIS — D485 Neoplasm of uncertain behavior of skin: Secondary | ICD-10-CM | POA: Diagnosis not present

## 2023-09-10 DIAGNOSIS — I441 Atrioventricular block, second degree: Secondary | ICD-10-CM | POA: Diagnosis not present

## 2023-09-12 ENCOUNTER — Observation Stay (HOSPITAL_COMMUNITY)
Admission: EM | Admit: 2023-09-12 | Discharge: 2023-09-14 | Disposition: A | Discharge status: Home / Self Care | Attending: Emergency Medicine | Admitting: Emergency Medicine

## 2023-09-12 DIAGNOSIS — E785 Hyperlipidemia, unspecified: Secondary | ICD-10-CM | POA: Diagnosis not present

## 2023-09-12 DIAGNOSIS — F411 Generalized anxiety disorder: Secondary | ICD-10-CM | POA: Diagnosis not present

## 2023-09-12 DIAGNOSIS — I110 Hypertensive heart disease with heart failure: Secondary | ICD-10-CM | POA: Insufficient documentation

## 2023-09-12 DIAGNOSIS — F10920 Alcohol use, unspecified with intoxication, uncomplicated: Secondary | ICD-10-CM | POA: Insufficient documentation

## 2023-09-12 DIAGNOSIS — R0902 Hypoxemia: Secondary | ICD-10-CM | POA: Diagnosis not present

## 2023-09-12 DIAGNOSIS — Z79899 Other long term (current) drug therapy: Secondary | ICD-10-CM | POA: Insufficient documentation

## 2023-09-12 DIAGNOSIS — R0602 Shortness of breath: Secondary | ICD-10-CM | POA: Diagnosis not present

## 2023-09-12 DIAGNOSIS — R609 Edema, unspecified: Secondary | ICD-10-CM | POA: Diagnosis not present

## 2023-09-12 DIAGNOSIS — I517 Cardiomegaly: Secondary | ICD-10-CM | POA: Diagnosis not present

## 2023-09-12 DIAGNOSIS — I10 Essential (primary) hypertension: Secondary | ICD-10-CM | POA: Diagnosis not present

## 2023-09-12 DIAGNOSIS — J9621 Acute and chronic respiratory failure with hypoxia: Secondary | ICD-10-CM | POA: Diagnosis present

## 2023-09-12 DIAGNOSIS — M79601 Pain in right arm: Secondary | ICD-10-CM

## 2023-09-12 DIAGNOSIS — M25531 Pain in right wrist: Secondary | ICD-10-CM | POA: Diagnosis present

## 2023-09-12 DIAGNOSIS — J441 Chronic obstructive pulmonary disease with (acute) exacerbation: Secondary | ICD-10-CM | POA: Diagnosis not present

## 2023-09-12 DIAGNOSIS — M069 Rheumatoid arthritis, unspecified: Secondary | ICD-10-CM | POA: Diagnosis not present

## 2023-09-12 DIAGNOSIS — I5032 Chronic diastolic (congestive) heart failure: Secondary | ICD-10-CM | POA: Diagnosis not present

## 2023-09-12 DIAGNOSIS — E782 Mixed hyperlipidemia: Secondary | ICD-10-CM | POA: Diagnosis not present

## 2023-09-12 DIAGNOSIS — J449 Chronic obstructive pulmonary disease, unspecified: Secondary | ICD-10-CM | POA: Diagnosis not present

## 2023-09-12 DIAGNOSIS — R918 Other nonspecific abnormal finding of lung field: Secondary | ICD-10-CM | POA: Diagnosis not present

## 2023-09-12 DIAGNOSIS — M7989 Other specified soft tissue disorders: Secondary | ICD-10-CM | POA: Diagnosis not present

## 2023-09-12 DIAGNOSIS — R062 Wheezing: Secondary | ICD-10-CM | POA: Diagnosis not present

## 2023-09-12 NOTE — ED Triage Notes (Signed)
 Pt BIB [facility] from home for SOB. Pt tachypneic with increased work of breathing. Denies CP. R arm red, tender, swollen. Pt thinks it is his RA. Hx COPD.   EMS: Wheezing auscultated in all lung  2 duonebs 25 mcg fentanyl  IN  160/80 Found at 89 3L [place] 80s-90s HR 36 RR

## 2023-09-13 ENCOUNTER — Other Ambulatory Visit: Payer: Self-pay

## 2023-09-13 ENCOUNTER — Emergency Department (HOSPITAL_COMMUNITY)

## 2023-09-13 ENCOUNTER — Encounter (HOSPITAL_COMMUNITY): Payer: Self-pay

## 2023-09-13 DIAGNOSIS — I10 Essential (primary) hypertension: Secondary | ICD-10-CM

## 2023-09-13 DIAGNOSIS — M25531 Pain in right wrist: Secondary | ICD-10-CM | POA: Diagnosis not present

## 2023-09-13 DIAGNOSIS — M069 Rheumatoid arthritis, unspecified: Secondary | ICD-10-CM

## 2023-09-13 DIAGNOSIS — E782 Mixed hyperlipidemia: Secondary | ICD-10-CM

## 2023-09-13 DIAGNOSIS — J441 Chronic obstructive pulmonary disease with (acute) exacerbation: Secondary | ICD-10-CM

## 2023-09-13 DIAGNOSIS — J9621 Acute and chronic respiratory failure with hypoxia: Secondary | ICD-10-CM | POA: Diagnosis not present

## 2023-09-13 DIAGNOSIS — I517 Cardiomegaly: Secondary | ICD-10-CM | POA: Diagnosis not present

## 2023-09-13 DIAGNOSIS — F411 Generalized anxiety disorder: Secondary | ICD-10-CM

## 2023-09-13 DIAGNOSIS — R0602 Shortness of breath: Secondary | ICD-10-CM | POA: Diagnosis not present

## 2023-09-13 DIAGNOSIS — I5032 Chronic diastolic (congestive) heart failure: Secondary | ICD-10-CM

## 2023-09-13 DIAGNOSIS — R918 Other nonspecific abnormal finding of lung field: Secondary | ICD-10-CM | POA: Diagnosis not present

## 2023-09-13 DIAGNOSIS — M7989 Other specified soft tissue disorders: Secondary | ICD-10-CM | POA: Diagnosis not present

## 2023-09-13 LAB — BASIC METABOLIC PANEL WITH GFR
Anion gap: 16 — ABNORMAL HIGH (ref 5–15)
Anion gap: 17 — ABNORMAL HIGH (ref 5–15)
BUN: 8 mg/dL (ref 8–23)
BUN: 8 mg/dL (ref 8–23)
CO2: 21 mmol/L — ABNORMAL LOW (ref 22–32)
CO2: 23 mmol/L (ref 22–32)
Calcium: 9 mg/dL (ref 8.9–10.3)
Calcium: 9.4 mg/dL (ref 8.9–10.3)
Chloride: 91 mmol/L — ABNORMAL LOW (ref 98–111)
Chloride: 92 mmol/L — ABNORMAL LOW (ref 98–111)
Creatinine, Ser: 0.66 mg/dL (ref 0.61–1.24)
Creatinine, Ser: 0.66 mg/dL (ref 0.61–1.24)
GFR, Estimated: >60 mL/min (ref >60)
GFR, Estimated: >60 mL/min (ref >60)
Glucose, Bld: 125 mg/dL — ABNORMAL HIGH (ref 70–99)
Glucose, Bld: 213 mg/dL — ABNORMAL HIGH (ref 70–99)
Potassium: 3.8 mmol/L (ref 3.5–5.1)
Potassium: 3.9 mmol/L (ref 3.5–5.1)
Sodium: 129 mmol/L — ABNORMAL LOW (ref 135–145)
Sodium: 130 mmol/L — ABNORMAL LOW (ref 135–145)

## 2023-09-13 LAB — BLOOD GAS, VENOUS
Acid-Base Excess: 2.1 mmol/L — ABNORMAL HIGH (ref 0.0–2.0)
Bicarbonate: 28.3 mmol/L — ABNORMAL HIGH (ref 20.0–28.0)
O2 Saturation: 74.1 %
Patient temperature: 37
pCO2, Ven: 49 mmHg (ref 44–60)
pH, Ven: 7.37 (ref 7.25–7.43)
pO2, Ven: 45 mmHg (ref 32–45)

## 2023-09-13 LAB — PRO BRAIN NATRIURETIC PEPTIDE: Pro Brain Natriuretic Peptide: 211 pg/mL (ref <300.0)

## 2023-09-13 LAB — CBC
HCT: 41.3 % (ref 39.0–52.0)
HCT: 38.9 % — ABNORMAL LOW (ref 39.0–52.0)
Hemoglobin: 13.9 g/dL (ref 13.0–17.0)
Hemoglobin: 12.6 g/dL — ABNORMAL LOW (ref 13.0–17.0)
MCH: 34.5 pg — ABNORMAL HIGH (ref 26.0–34.0)
MCH: 33.2 pg (ref 26.0–34.0)
MCHC: 33.7 g/dL (ref 30.0–36.0)
MCHC: 32.4 g/dL (ref 30.0–36.0)
MCV: 102.5 fL — ABNORMAL HIGH (ref 80.0–100.0)
MCV: 102.6 fL — ABNORMAL HIGH (ref 80.0–100.0)
Platelets: 166 K/uL (ref 150–400)
Platelets: 148 K/uL — ABNORMAL LOW (ref 150–400)
RBC: 4.03 MIL/uL — ABNORMAL LOW (ref 4.22–5.81)
RBC: 3.79 MIL/uL — ABNORMAL LOW (ref 4.22–5.81)
RDW: 16 % — ABNORMAL HIGH (ref 11.5–15.5)
RDW: 15.9 % — ABNORMAL HIGH (ref 11.5–15.5)
WBC: 10.3 K/uL (ref 4.0–10.5)
WBC: 12.9 K/uL — ABNORMAL HIGH (ref 4.0–10.5)
nRBC: 0 % (ref 0.0–0.2)
nRBC: 0 % (ref 0.0–0.2)

## 2023-09-13 LAB — TROPONIN T, HIGH SENSITIVITY
Troponin T High Sensitivity: 17 ng/L (ref 0–19)
Troponin T High Sensitivity: <15 ng/L (ref 0–19)

## 2023-09-13 LAB — URIC ACID: Uric Acid, Serum: 5.2 mg/dL (ref 3.7–8.6)

## 2023-09-13 LAB — C-REACTIVE PROTEIN: CRP: 2.4 mg/dL — ABNORMAL HIGH (ref <1.0)

## 2023-09-13 LAB — SEDIMENTATION RATE: Sed Rate: 22 mm/h — ABNORMAL HIGH (ref 0–16)

## 2023-09-13 MED ORDER — ORAL CARE MOUTH RINSE: 15.0000 mL | OROMUCOSAL | Status: DC | PRN

## 2023-09-13 MED ORDER — SERTRALINE HCL 100 MG PO TABS
100.0000 mg | ORAL_TABLET | Freq: Every day | ORAL | Status: DC
  Administered 2023-09-13 – 2023-09-14 (×2): 100 mg via ORAL
  Filled 2023-09-13 (×2): qty 1

## 2023-09-13 MED ORDER — IPRATROPIUM-ALBUTEROL 0.5-2.5 (3) MG/3ML IN SOLN: 3.0000 mL | RESPIRATORY_TRACT | Status: DC | PRN

## 2023-09-13 MED ORDER — PREDNISONE 20 MG PO TABS
40.0000 mg | ORAL_TABLET | Freq: Every day | ORAL | Status: DC
  Administered 2023-09-14: 40 mg via ORAL
  Filled 2023-09-13: qty 2

## 2023-09-13 MED ORDER — PANTOPRAZOLE SODIUM 40 MG PO TBEC
40.0000 mg | DELAYED_RELEASE_TABLET | Freq: Every day | ORAL | Status: DC
  Administered 2023-09-13 – 2023-09-14 (×2): 40 mg via ORAL
  Filled 2023-09-13 (×2): qty 1

## 2023-09-13 MED ORDER — ACETAMINOPHEN 650 MG RE SUPP: 650.0000 mg | Freq: Four times a day (QID) | RECTAL | Status: DC | PRN

## 2023-09-13 MED ORDER — ATORVASTATIN CALCIUM 20 MG PO TABS
20.0000 mg | ORAL_TABLET | Freq: Every day | ORAL | Status: DC
  Administered 2023-09-13 – 2023-09-14 (×2): 20 mg via ORAL
  Filled 2023-09-13 (×2): qty 1

## 2023-09-13 MED ORDER — POLYETHYLENE GLYCOL 3350 17 G PO PACK: 17.0000 g | PACK | Freq: Every day | ORAL | Status: DC | PRN

## 2023-09-13 MED ORDER — FOLIC ACID 1 MG PO TABS
1.0000 mg | ORAL_TABLET | Freq: Every day | ORAL | Status: DC
  Administered 2023-09-13 – 2023-09-14 (×2): 1 mg via ORAL
  Filled 2023-09-13 (×2): qty 1

## 2023-09-13 MED ORDER — IPRATROPIUM-ALBUTEROL 0.5-2.5 (3) MG/3ML IN SOLN
3.0000 mL | Freq: Once | RESPIRATORY_TRACT | Status: AC
  Administered 2023-09-13: 3 mL via RESPIRATORY_TRACT
  Filled 2023-09-13: qty 3

## 2023-09-13 MED ORDER — SODIUM CHLORIDE 0.9% FLUSH
3.0000 mL | Freq: Two times a day (BID) | INTRAVENOUS | Status: DC
  Administered 2023-09-13 – 2023-09-14 (×3): 3 mL via INTRAVENOUS

## 2023-09-13 MED ORDER — ACETAMINOPHEN 325 MG PO TABS
650.0000 mg | ORAL_TABLET | Freq: Four times a day (QID) | ORAL | Status: DC | PRN
  Administered 2023-09-13 – 2023-09-14 (×2): 650 mg via ORAL
  Filled 2023-09-13 (×2): qty 2

## 2023-09-13 MED ORDER — AMLODIPINE BESYLATE 10 MG PO TABS
5.0000 mg | ORAL_TABLET | Freq: Every day | ORAL | Status: DC
  Administered 2023-09-13 – 2023-09-14 (×2): 5 mg via ORAL
  Filled 2023-09-13 (×2): qty 1

## 2023-09-13 MED ORDER — METHYLPREDNISOLONE SODIUM SUCC 125 MG IJ SOLR
125.0000 mg | INTRAMUSCULAR | Status: AC
  Administered 2023-09-13: 125 mg via INTRAVENOUS
  Filled 2023-09-13: qty 2

## 2023-09-13 MED ORDER — HYDROMORPHONE HCL 1 MG/ML IJ SOLN
0.5000 mg | Freq: Once | INTRAMUSCULAR | Status: AC
  Administered 2023-09-13: 0.5 mg via INTRAVENOUS
  Filled 2023-09-13: qty 1

## 2023-09-13 MED ORDER — PROCHLORPERAZINE EDISYLATE 10 MG/2ML IJ SOLN: 5.0000 mg | Freq: Four times a day (QID) | INTRAMUSCULAR | Status: DC | PRN

## 2023-09-13 MED ORDER — BISACODYL 5 MG PO TBEC: 5.0000 mg | DELAYED_RELEASE_TABLET | Freq: Every day | ORAL | Status: DC | PRN

## 2023-09-13 MED ORDER — HYDROMORPHONE HCL 1 MG/ML IJ SOLN
0.5000 mg | INTRAMUSCULAR | Status: DC | PRN
  Administered 2023-09-13 (×3): 0.5 mg via INTRAVENOUS
  Filled 2023-09-13 (×3): qty 0.5

## 2023-09-13 MED ORDER — OXYCODONE HCL 5 MG PO TABS
5.0000 mg | ORAL_TABLET | ORAL | Status: DC | PRN
  Administered 2023-09-13 – 2023-09-14 (×3): 5 mg via ORAL
  Filled 2023-09-13 (×3): qty 1

## 2023-09-13 MED ORDER — FENTANYL CITRATE PF 50 MCG/ML IJ SOSY
50.0000 ug | PREFILLED_SYRINGE | Freq: Once | INTRAMUSCULAR | Status: AC
  Administered 2023-09-13: 50 ug via INTRAVENOUS
  Filled 2023-09-13: qty 1

## 2023-09-13 MED ORDER — ENOXAPARIN SODIUM 40 MG/0.4ML IJ SOSY
40.0000 mg | PREFILLED_SYRINGE | INTRAMUSCULAR | Status: DC
  Administered 2023-09-13 – 2023-09-14 (×2): 40 mg via SUBCUTANEOUS
  Filled 2023-09-13 (×2): qty 0.4

## 2023-09-13 MED ORDER — MAGNESIUM SULFATE IN D5W 1-5 GM/100ML-% IV SOLN
1.0000 g | Freq: Once | INTRAVENOUS | Status: AC
  Administered 2023-09-13: 1 g via INTRAVENOUS
  Filled 2023-09-13: qty 100

## 2023-09-13 MED ORDER — ALBUTEROL SULFATE (2.5 MG/3ML) 0.083% IN NEBU
10.0000 mg/h | INHALATION_SOLUTION | Freq: Once | RESPIRATORY_TRACT | Status: AC
  Administered 2023-09-13: 10 mg/h via RESPIRATORY_TRACT
  Filled 2023-09-13: qty 12

## 2023-09-13 MED ORDER — ALPRAZOLAM 0.25 MG PO TABS
0.2500 mg | ORAL_TABLET | Freq: Three times a day (TID) | ORAL | Status: DC | PRN
  Administered 2023-09-13 – 2023-09-14 (×2): 0.25 mg via ORAL
  Filled 2023-09-13 (×2): qty 1

## 2023-09-13 MED ORDER — FUROSEMIDE 40 MG PO TABS
40.0000 mg | ORAL_TABLET | Freq: Every day | ORAL | Status: DC
  Administered 2023-09-13 – 2023-09-14 (×2): 40 mg via ORAL
  Filled 2023-09-13 (×2): qty 1

## 2023-09-13 MED ORDER — LORAZEPAM 0.5 MG PO TABS
0.5000 mg | ORAL_TABLET | Freq: Once | ORAL | Status: AC
  Administered 2023-09-13: 0.5 mg via ORAL
  Filled 2023-09-13: qty 1

## 2023-09-13 MED ORDER — SODIUM CHLORIDE 0.9 % IV SOLN
1.0000 g | INTRAVENOUS | Status: DC
  Administered 2023-09-13 – 2023-09-14 (×2): 1 g via INTRAVENOUS
  Filled 2023-09-13 (×2): qty 10

## 2023-09-13 MED ORDER — METHOCARBAMOL 500 MG PO TABS
500.0000 mg | ORAL_TABLET | Freq: Three times a day (TID) | ORAL | Status: DC | PRN
  Administered 2023-09-14: 500 mg via ORAL
  Filled 2023-09-13 (×2): qty 1

## 2023-09-13 MED ORDER — MAGNESIUM SULFATE 50 % IJ SOLN: 1.0000 g | Freq: Once | INTRAMUSCULAR | Status: DC

## 2023-09-13 MED ORDER — METHYLPREDNISOLONE SODIUM SUCC 125 MG IJ SOLR
125.0000 mg | Freq: Two times a day (BID) | INTRAMUSCULAR | Status: AC
  Administered 2023-09-13 – 2023-09-14 (×2): 125 mg via INTRAVENOUS
  Filled 2023-09-13 (×2): qty 2

## 2023-09-13 NOTE — Progress Notes (Signed)
 Brief progress note: - Admitted earlier today.  As per H&P done earlier today: [name] [name] is a 78 y.o. male with medical history significant for COPD, chronic Evoxac respiratory failure, hypertension, hyperlipidemia, chronic HFpEF, anxiety, and rheumatoid arthritis who presents with increased shortness of breath and cough, and severe pain involving the right wrist and hand.   Patient reports that he has been using oxycodone  at home for severe pain in his right hand and wrist that he attributes to a flare in his rheumatoid arthritis.  He continues to have severe pain despite this.  He has also developed increase in his chronic cough and chronic shortness of breath which he believes may be caused by his anxiety.  He denies any fevers, chills, or chest pain.  Aside from the right hand and wrist, there are no other joint aches currently.   EMS was called and found the patient to have labored respirations and oxygen  saturation of 89% on his usual 3 L/min supplemental oxygen .  They administered fentanyl  and 2 DuoNebs prior to arrival in the ED.   ED Course: Upon arrival to the ED, patient is found to be afebrile and saturating mid 90s on 4 L/min supplemental oxygen  with tachypnea, tachycardia, and stable blood pressure.   09/13/2023: Patient seen.  Next of breath has improved.  Right wrist pain is improving.  Continue to optimize pain control.  Likely discharge tomorrow.

## 2023-09-13 NOTE — ED Provider Notes (Addendum)
 [place] EMERGENCY DEPARTMENT AT [facility] Provider Note   CSN: [id number] Arrival date & time: 09/12/23  2350     Patient presents with: Shortness of Breath and Arm Pain   [name] [name] is a 78 y.o. male with history of COPD, chronic respiratory failure with hypoxia on chronic oxygen , hypertension, swelling of lower extremity, alcohol abuse, GAD, GERD, rheumatoid arthritis.  Patient presents to ED for evaluation of right arm pain.  Patient reports that beginning tonight he developed a right arm pain throughout his entire right arm which he attributes to his rheumatoid arthritis.  He states he takes Tylenol , oxycodone  at home however these did not relieve his symptoms.  He denies any falls, trauma to his right arm tonight.  Reports he feels pain from his right hand to his right shoulder.  He also arrives with wheezing throughout all fields and hypoxic on room air.  He reports that he wears chronic home oxygen .  Denies that he recently increased this.  He actually denies any shortness of breath or chest pain.  He is wheezing all over and he was given 2 DuoNebs by EMS.  He is tachypneic on exam.  He denies any chest pain, nausea, vomiting, fevers.  Denies diarrhea or abdominal pain.  Denies leg swelling.   Shortness of Breath Associated symptoms: no chest pain   Arm Pain Associated symptoms include shortness of breath. Pertinent negatives include no chest pain.       Prior to Admission medications   Medication Sig Start Date End Date Taking? Authorizing Provider  albuterol  (VENTOLIN  HFA) 108 (90 Base) MCG/ACT inhaler Inhale 2 puffs into the lungs every 6 (six) hours as needed for wheezing or shortness of breath. 12/08/21  Yes Jude Harden GAILS, MD  ALPRAZolam  (XANAX ) 0.25 MG tablet Take 1 tablet (0.25 mg total) by mouth 3 (three) times daily as needed for anxiety. 07/11/23  Yes Sebastian Toribio GAILS, MD  amLODipine  (NORVASC ) 5 MG tablet Take 5 mg by mouth daily. 11/23/22  Yes  [provider]  atorvastatin  (LIPITOR) 20 MG tablet Take 20 mg by mouth daily. 07/01/22  Yes [provider]  budesonide -glycopyrrolate -formoterol  (BREZTRI  AEROSPHERE) 160-9-4.8 MCG/ACT AERO inhaler Inhale 2 puffs into the lungs in the morning and at bedtime. 08/20/23  Yes Kassie Acquanetta Bradley, MD  docusate sodium  (COLACE) 100 MG capsule Take 1 capsule (100 mg total) by mouth 2 (two) times daily. Patient taking differently: Take 100 mg by mouth daily as needed for mild constipation. 07/01/23  Yes Darci Pore, MD  folic acid  (FOLVITE ) 1 MG tablet Take 1 mg by mouth daily. 11/01/22  Yes [provider]  furosemide  (LASIX ) 40 MG tablet Take 40 mg by mouth in the morning. 03/06/22  Yes [provider]  ipratropium-albuterol  (DUONEB) 0.5-2.5 (3) MG/3ML SOLN Take 3 mLs by nebulization 3 (three) times daily. Patient taking differently: Take 3 mLs by nebulization daily at 6 (six) AM. 07/11/23  Yes Sebastian Toribio GAILS, MD  methocarbamol  (ROBAXIN ) 500 MG tablet Take 1 tablet (500 mg total) by mouth every 8 (eight) hours as needed for muscle spasms. 03/24/23  Yes Vernon Ranks, MD  methotrexate  50 MG/2ML injection Inject 20 mg into the skin every Friday.   Yes [provider]  Multiple Vitamins-Minerals (MULTIVITAMIN WITH MINERALS) tablet Take 1 tablet by mouth daily with breakfast.   Yes [provider]  naproxen (NAPROSYN) 500 MG tablet Take 500 mg by mouth every 12 (twelve) hours as needed for mild pain (pain  score 1-3).   Yes [provider]  omeprazole (PRILOSEC) 20 MG capsule Take 20 mg by mouth daily. 07/15/23  Yes [provider]  oxyCODONE  (OXY IR/ROXICODONE ) 5 MG immediate release tablet Take 1 tablet (5 mg total) by mouth every 4 (four) hours as needed for moderate pain (pain score 4-6). 07/15/23  Yes Sebastian Toribio GAILS, MD  OXYGEN  Inhale 4 L/min into the lungs continuous.   Yes [provider]  polyethylene glycol (MIRALAX   / GLYCOLAX ) 17 g packet Take 17 g by mouth 2 (two) times daily. Patient taking differently: Take 17 g by mouth daily as needed for mild constipation. 07/11/23  Yes Sebastian Toribio GAILS, MD  senna-docusate (SENOKOT-S) 8.6-50 MG tablet Take 2 tablets by mouth at bedtime. Patient taking differently: Take 2 tablets by mouth at bedtime as needed for mild constipation. 07/01/23  Yes Darci Pore, MD  sertraline  (ZOLOFT ) 100 MG tablet Take 100 mg by mouth daily. 02/22/23  Yes [provider]  TYLENOL  500 MG tablet Take 500 mg by mouth every 8 (eight) hours as needed for mild pain (pain score 1-3) or headache.   Yes [provider]  vitamin B-12 (CYANOCOBALAMIN ) 1000 MCG tablet Take 1,000 mcg by mouth daily.   Yes [provider]  ondansetron  (ZOFRAN ) 4 MG tablet Take 1 tablet (4 mg total) by mouth every 6 (six) hours as needed for nausea. Patient not taking: Reported on 09/13/2023 07/11/23   Sebastian Toribio GAILS, MD    Allergies: Fluticasone -salmeterol    Review of Systems  Respiratory:  Positive for shortness of breath.   Cardiovascular:  Negative for chest pain.  Musculoskeletal:  Positive for arthralgias and myalgias.  All other systems reviewed and are negative.   Updated Vital Signs BP (!) 147/66   Pulse (!) 118   Temp 97.9 F (36.6 C) (Oral)   Resp 19   Ht 5' 11 (1.803 m)   Wt 86.9 kg   SpO2 94%   BMI 26.72 kg/m   Physical Exam Vitals and nursing note reviewed.  Constitutional:      General: He is not in acute distress.    Appearance: He is well-developed.  HENT:     Head: Normocephalic and atraumatic.  Eyes:     Conjunctiva/sclera: Conjunctivae normal.  Cardiovascular:     Rate and Rhythm: Regular rhythm. Tachycardia present.     Heart sounds: No murmur heard. Pulmonary:     Effort: Pulmonary effort is normal. No respiratory distress.     Breath sounds: Wheezing and rhonchi present.  Abdominal:     Palpations: Abdomen is soft.     Tenderness:  There is no abdominal tenderness.  Musculoskeletal:        General: No swelling.     Cervical back: Neck supple.     Comments: Intact range of motion of right wrist, right elbow.  Due to range of motion to right shoulder secondary to pain.  No deformity noted.  Skin:    General: Skin is warm and dry.     Capillary Refill: Capillary refill takes less than 2 seconds.     Comments: Ecchymosis throughout right upper extremity.  Neurological:     Mental Status: He is alert and oriented to person, place, and time. Mental status is at baseline.  Psychiatric:        Mood and Affect: Mood normal.     (all labs ordered are listed, but only abnormal results are displayed) Labs Reviewed  CBC - Abnormal; Notable  for the following components:      Result Value   RBC 4.03 (*)    MCV 102.5 (*)    MCH 34.5 (*)    RDW 16.0 (*)    All other components within normal limits  BASIC METABOLIC PANEL WITH GFR - Abnormal; Notable for the following components:   Sodium 130 (*)    Chloride 92 (*)    Glucose, Bld 125 (*)    Anion gap 16 (*)    All other components within normal limits  BLOOD GAS, VENOUS - Abnormal; Notable for the following components:   Bicarbonate 28.3 (*)    Acid-Base Excess 2.1 (*)    All other components within normal limits  SEDIMENTATION RATE - Abnormal; Notable for the following components:   Sed Rate 22 (*)    All other components within normal limits  PRO BRAIN NATRIURETIC PEPTIDE  C-REACTIVE PROTEIN  URIC ACID  TROPONIN T, HIGH SENSITIVITY  TROPONIN T, HIGH SENSITIVITY    EKG: EKG Interpretation Date/Time:  Friday September 13 2023 00:15:17 EDT Ventricular Rate:  102 PR Interval:  149 QRS Duration:  108 QT Interval:  360 QTC Calculation: 469 R Axis:   59  Text Interpretation: Sinus tachycardia No significant change was found Confirmed by Carita Senior [phone]) on 09/13/2023 12:27:01 AM  Radiology: ARCOLA Wrist Complete Right Result Date: 09/13/2023 CLINICAL DATA:   Pain EXAM: RIGHT WRIST - COMPLETE 3+ VIEW COMPARISON:  None Available. FINDINGS: There is no evidence of fracture or dislocation. There is no evidence of arthropathy or other focal bone abnormality. There is soft tissue swelling surrounding the wrist. IMPRESSION: 1. No acute fracture or dislocation. 2. Soft tissue swelling surrounding the wrist. Electronically Signed   By: Greig Pique M.D.   On: 09/13/2023 01:33   DG Chest Portable 1 View Result Date: 09/13/2023 CLINICAL DATA:  Shortness of breath EXAM: PORTABLE CHEST 1 VIEW COMPARISON:  Chest x-ray 07/05/2023 FINDINGS: Diffuse interstitial markings throughout both lungs have not significantly changed. There is no new lung infiltrate, pleural effusion or pneumothorax. The cardiomediastinal silhouette is stable, the heart is mildly enlarged. No acute fractures are seen. IMPRESSION: 1. No acute lung infiltrate. 2. Stable diffuse interstitial markings may represent interstitial lung disease. 3. Mild cardiomegaly. Electronically Signed   By: Greig Pique M.D.   On: 09/13/2023 00:45    Procedures   Medications Ordered in the ED  magnesium  sulfate IVPB 1 g 100 mL (1 g Intravenous New Bag/Given 09/13/23 0255)  ipratropium-albuterol  (DUONEB) 0.5-2.5 (3) MG/3ML nebulizer solution 3 mL (3 mLs Nebulization Given 09/13/23 0020)  methylPREDNISolone  sodium succinate (SOLU-MEDROL ) 125 mg/2 mL injection 125 mg (125 mg Intravenous Given 09/13/23 0019)  fentaNYL  (SUBLIMAZE ) injection 50 mcg (50 mcg Intravenous Given 09/13/23 0019)  LORazepam  (ATIVAN ) tablet 0.5 mg (0.5 mg Oral Given 09/13/23 0124)  HYDROmorphone  (DILAUDID ) injection 0.5 mg (0.5 mg Intravenous Given 09/13/23 0232)  albuterol  (PROVENTIL ) (2.5 MG/3ML) 0.083% nebulizer solution (10 mg/hr Nebulization Given 09/13/23 0218)     Medical Decision Making Amount and/or Complexity of Data Reviewed Labs: ordered. Radiology: ordered. ECG/medicine tests: ordered.  Risk Prescription drug management. Decision regarding  hospitalization.   78 year old male presents for evaluation.  On exam, he is afebrile, tachycardic to 114.  Lung sounds have wheezing, rhonchi throughout, oxygen  saturation 94% on 4 L of oxygen .  Abdomen soft and compressible.  Neurological examination at baseline.  Right arm, 4 patient reports pain, has full ROM of right wrist, right elbow.  Reduced range of motion  of right shoulder secondary to pain however no overlying skin change, edema or warmth.  He reports baseline pain related to rheumatoid thrice.  States pain has worsened this evening.  Patient also arrives tachypneic, wheezing throughout.  Reports he initially called EMS due to pain.  EMS found patient on 87% on 3 L of oxygen . States he has had pain throughout the course of today very consistent with past episodes of rheumatoid with wrist pain.  Reports he tried taking home oxycodone  without relief.  Workup initiated with CBC, BMP, VBG, troponin x 2, BNP, CRP, sed rate, EKG, chest x-ray, plain film imaging of right wrist.  EKG also collected.  Given 50 mcg fentanyl  for pain, DuoNeb, Solu-Medrol , magnesium .  Patient CBC here without leukocytosis, no anemia.  Metabolic panel with hyponatremia 130, glucose 125, anion gap 16.  VBG unremarkable.  Troponin 17, delta pending.  BNP not elevated at 211.  Chest x-ray unremarkable, no consolidations or effusions.  CRP and sedimentation rate pending at this time.  Plan film imaging of patient chest unremarkable.  Plain film imaging of patient right wrist shows soft tissue swelling however patient has full ROM of right wrist so I doubt septic arthritis.  No white count elevation.  Patient reassessed after DuoNebs.  Patient continues to wheeze.  Placed on continuous duo nebulizer at this time.  Will require admission for COPD exacerbation and hypoxia.  Discussed with Dr. Charlton of [facility] hospitalist service who has agreed to admit patient.  Patient stable on admission.   Final diagnoses:  COPD  exacerbation (HCC)  Right arm pain    ED Discharge Orders     None             Ruthell Lonni JULIANNA DEVONNA 09/13/23 0335    Carita Senior, MD 09/13/23 [phone]

## 2023-09-13 NOTE — Progress Notes (Signed)
 Received pt from t he ED instable condition. Oriented to room and reviewed plan of care wiith patient.

## 2023-09-13 NOTE — H&P (Signed)
 History and Physical    [name] [name] FMW:[id number] DOB: November 23, 1945 DOA: 09/12/2023  PCP: Regino Slater, MD   Patient coming from: Home   Chief Complaint: Right wrist and hand pain, increased SOB and cough   HPI: [name] [name] is a 78 y.o. male with medical history significant for COPD, chronic Evoxac respiratory failure, hypertension, hyperlipidemia, chronic HFpEF, anxiety, and rheumatoid arthritis who presents with increased shortness of breath and cough, and severe pain involving the right wrist and hand.  Patient reports that he has been using oxycodone  at home for severe pain in his right hand and wrist that he attributes to a flare in his rheumatoid arthritis.  He continues to have severe pain despite this.  He has also developed increase in his chronic cough and chronic shortness of breath which he believes may be caused by his anxiety.  He denies any fevers, chills, or chest pain.  Aside from the right hand and wrist, there are no other joint aches currently.  EMS was called and found the patient to have labored respirations and oxygen  saturation of 89% on his usual 3 L/min supplemental oxygen .  They administered fentanyl  and 2 DuoNebs prior to arrival in the ED.  ED Course: Upon arrival to the ED, patient is found to be afebrile and saturating mid 90s on 4 L/min supplemental oxygen  with tachypnea, tachycardia, and stable blood pressure.  Review of Systems:  All other systems reviewed and apart from HPI, are negative.  Past Medical History:  Diagnosis Date   COPD (chronic obstructive pulmonary disease) (HCC)    Generalized anxiety disorder 07/07/2022   High cholesterol    Hypertension    Rheumatoid arthritis (HCC) 11/12/2022    Past Surgical History:  Procedure Laterality Date   NASAL SINUS SURGERY  2007    Social History:   reports that he quit smoking about 15 years ago. His smoking use included cigarettes. He started smoking about 55 years ago. He has a  80 pack-year smoking history. He has never been exposed to tobacco smoke. He has never used smokeless tobacco. He reports current alcohol use of about 12.0 standard drinks of alcohol per week. He reports that he does not use drugs.  Allergies  Allergen Reactions   Fluticasone -Salmeterol Shortness Of Breath and Other (See Comments)    Worsening shortness of breath..    Family History  Problem Relation Age of Onset   Esophageal cancer Father      Prior to Admission medications   Medication Sig Start Date End Date Taking? Authorizing Provider  albuterol  (VENTOLIN  HFA) 108 (90 Base) MCG/ACT inhaler Inhale 2 puffs into the lungs every 6 (six) hours as needed for wheezing or shortness of breath. 12/08/21  Yes Jude Harden GAILS, MD  ALPRAZolam  (XANAX ) 0.25 MG tablet Take 1 tablet (0.25 mg total) by mouth 3 (three) times daily as needed for anxiety. 07/11/23  Yes Sebastian Toribio GAILS, MD  amLODipine  (NORVASC ) 5 MG tablet Take 5 mg by mouth daily. 11/23/22  Yes [provider]  atorvastatin  (LIPITOR) 20 MG tablet Take 20 mg by mouth daily. 07/01/22  Yes [provider]  budesonide -glycopyrrolate -formoterol  (BREZTRI  AEROSPHERE) 160-9-4.8 MCG/ACT AERO inhaler Inhale 2 puffs into the lungs in the morning and at bedtime. 08/20/23  Yes Kassie Acquanetta Bradley, MD  docusate sodium  (COLACE) 100 MG capsule Take 1 capsule (100 mg total) by mouth 2 (two) times daily. Patient taking differently: Take 100 mg by mouth daily as needed for mild constipation. 07/01/23  Yes  Darci Pore, MD  folic acid  (FOLVITE ) 1 MG tablet Take 1 mg by mouth daily. 11/01/22  Yes [provider]  furosemide  (LASIX ) 40 MG tablet Take 40 mg by mouth in the morning. 03/06/22  Yes [provider]  ipratropium-albuterol  (DUONEB) 0.5-2.5 (3) MG/3ML SOLN Take 3 mLs by nebulization 3 (three) times daily. Patient taking differently: Take 3 mLs by nebulization daily at 6 (six) AM. 07/11/23  Yes Sebastian Toribio GAILS,  MD  methocarbamol  (ROBAXIN ) 500 MG tablet Take 1 tablet (500 mg total) by mouth every 8 (eight) hours as needed for muscle spasms. 03/24/23  Yes Vernon Ranks, MD  methotrexate  50 MG/2ML injection Inject 20 mg into the skin every Friday.   Yes [provider]  Multiple Vitamins-Minerals (MULTIVITAMIN WITH MINERALS) tablet Take 1 tablet by mouth daily with breakfast.   Yes [provider]  naproxen (NAPROSYN) 500 MG tablet Take 500 mg by mouth every 12 (twelve) hours as needed for mild pain (pain score 1-3).   Yes [provider]  omeprazole (PRILOSEC) 20 MG capsule Take 20 mg by mouth daily. 07/15/23  Yes [provider]  oxyCODONE  (OXY IR/ROXICODONE ) 5 MG immediate release tablet Take 1 tablet (5 mg total) by mouth every 4 (four) hours as needed for moderate pain (pain score 4-6). 07/15/23  Yes Sebastian Toribio GAILS, MD  OXYGEN  Inhale 4 L/min into the lungs continuous.   Yes [provider]  polyethylene glycol (MIRALAX  / GLYCOLAX ) 17 g packet Take 17 g by mouth 2 (two) times daily. Patient taking differently: Take 17 g by mouth daily as needed for mild constipation. 07/11/23  Yes Sebastian Toribio GAILS, MD  senna-docusate (SENOKOT-S) 8.6-50 MG tablet Take 2 tablets by mouth at bedtime. Patient taking differently: Take 2 tablets by mouth at bedtime as needed for mild constipation. 07/01/23  Yes Darci Pore, MD  sertraline  (ZOLOFT ) 100 MG tablet Take 100 mg by mouth daily. 02/22/23  Yes [provider]  TYLENOL  500 MG tablet Take 500 mg by mouth every 8 (eight) hours as needed for mild pain (pain score 1-3) or headache.   Yes [provider]  vitamin B-12 (CYANOCOBALAMIN ) 1000 MCG tablet Take 1,000 mcg by mouth daily.   Yes [provider]  ondansetron  (ZOFRAN ) 4 MG tablet Take 1 tablet (4 mg total) by mouth every 6 (six) hours as needed for nausea. Patient not taking: Reported on 09/13/2023 07/11/23   Sebastian Toribio GAILS, MD    Physical  Exam: Vitals:   09/13/23 0218 09/13/23 0400 09/13/23 0430 09/13/23 0500  BP:  (!) 148/69 (!) 143/62 (!) 162/66  Pulse: (!) 118 (!) 121 (!) 118 (!) 122  Resp: 19  14 14   Temp:   98 F (36.7 C) 98 F (36.7 C)  TempSrc:    Oral  SpO2: 94% 92% 90% 95%  Weight:      Height:        Constitutional: No pallor or diaphoresis, appears uncomfortable  Eyes: PERTLA, lids and conjunctivae normal ENMT: Mucous membranes are moist. Posterior pharynx clear of any exudate or lesions.   Neck: supple, no masses  Respiratory: Dyspneic with speech. Decreased breath sounds bilaterally with prolonged expiratory phase.  Cardiovascular: Rate ~120 and regular. No JVD.  Abdomen: No tenderness, soft. Bowel sounds active.  Musculoskeletal: no clubbing / cyanosis. Right hand and wrist tenderness.   Skin: Ecchymoses involving upper extremities. Warm, dry, well-perfused. Neurologic: CN 2-12 grossly intact. Moving all extremities. Alert and oriented.  Psychiatric: Calm. Cooperative.  Labs and Imaging on Admission: I have personally reviewed following labs and imaging studies  CBC: Recent Labs  Lab 09/13/23 0014  WBC 10.3  HGB 13.9  HCT 41.3  MCV 102.5*  PLT 166   Basic Metabolic Panel: Recent Labs  Lab 09/13/23 0014  NA 130*  K 3.8  CL 92*  CO2 23  GLUCOSE 125*  BUN 8  CREATININE 0.66  CALCIUM  9.4   GFR: Estimated Creatinine Clearance: 85 mL/min (by C-G formula based on SCr of 0.66 mg/dL). Liver Function Tests: No results for input(s): AST, ALT, ALKPHOS, BILITOT, PROT, ALBUMIN in the last 168 hours. No results for input(s): LIPASE, AMYLASE in the last 168 hours. No results for input(s): AMMONIA in the last 168 hours. Coagulation Profile: No results for input(s): INR, PROTIME in the last 168 hours. Cardiac Enzymes: No results for input(s): CKTOTAL, CKMB, CKMBINDEX, TROPONINI in the last 168 hours. BNP (last 3 results) Recent Labs    09/13/23 0056  PROBNP  211.0   HbA1C: No results for input(s): HGBA1C in the last 72 hours. CBG: No results for input(s): GLUCAP in the last 168 hours. Lipid Profile: No results for input(s): CHOL, HDL, LDLCALC, TRIG, CHOLHDL, LDLDIRECT in the last 72 hours. Thyroid  Function Tests: No results for input(s): TSH, T4TOTAL, FREET4, T3FREE, THYROIDAB in the last 72 hours. Anemia Panel: No results for input(s): VITAMINB12, FOLATE, FERRITIN, TIBC, IRON, RETICCTPCT in the last 72 hours. Urine analysis:    Component Value Date/Time   COLORURINE YELLOW 06/25/2023 0109   APPEARANCEUR CLEAR 06/25/2023 0109   LABSPEC 1.013 06/25/2023 0109   PHURINE 5.0 06/25/2023 0109   GLUCOSEU NEGATIVE 06/25/2023 0109   HGBUR NEGATIVE 06/25/2023 0109   BILIRUBINUR NEGATIVE 06/25/2023 0109   KETONESUR NEGATIVE 06/25/2023 0109   PROTEINUR NEGATIVE 06/25/2023 0109   NITRITE NEGATIVE 06/25/2023 0109   LEUKOCYTESUR NEGATIVE 06/25/2023 0109   Sepsis Labs: @LABRCNTIP (procalcitonin:4,lacticidven:4) )No results found for this or any previous visit (from the past 240 hours).   Radiological Exams on Admission: DG Wrist Complete Right Result Date: 09/13/2023 CLINICAL DATA:  Pain EXAM: RIGHT WRIST - COMPLETE 3+ VIEW COMPARISON:  None Available. FINDINGS: There is no evidence of fracture or dislocation. There is no evidence of arthropathy or other focal bone abnormality. There is soft tissue swelling surrounding the wrist. IMPRESSION: 1. No acute fracture or dislocation. 2. Soft tissue swelling surrounding the wrist. Electronically Signed   By: Greig Pique M.D.   On: 09/13/2023 01:33   DG Chest Portable 1 View Result Date: 09/13/2023 CLINICAL DATA:  Shortness of breath EXAM: PORTABLE CHEST 1 VIEW COMPARISON:  Chest x-ray 07/05/2023 FINDINGS: Diffuse interstitial markings throughout both lungs have not significantly changed. There is no new lung infiltrate, pleural effusion or pneumothorax. The  cardiomediastinal silhouette is stable, the heart is mildly enlarged. No acute fractures are seen. IMPRESSION: 1. No acute lung infiltrate. 2. Stable diffuse interstitial markings may represent interstitial lung disease. 3. Mild cardiomegaly. Electronically Signed   By: Greig Pique M.D.   On: 09/13/2023 00:45    EKG: Independently reviewed. Sinus tachycardia, rate 102.   Assessment/Plan   1. COPD exacerbation; acute on chronic hypoxic respiratory failure  - Culture sputum, start antibiotic, continue systemic steroids, continue short-acting bronchodilators, continue supplemental O2    2. Acute right wrist pain; rheumatoid arthritis  - Patient complains of severe pain in the right hand and wrist that he attributes to RA flare    - No fracture or dislocation on x-rays; no fluctuance   -  Anticipate improvement with glucocorticoids that have been started for COPD exacerbation, would consider advanced imaging and/or aspiration if worsens or fails to improve as expected   3. Chronic HFpEF  - Appears compensated  - Continue Lasix     4. Hypertension  - Continue amlodipine     5. Hyperlipidemia  - Continue Lipitor   6. GAD  - Continue Zoloft  and as-needed Xanax      DVT prophylaxis: Lovenox   Code Status: Full  Level of Care: Level of care: Progressive Family Communication: None present   Disposition Plan: Patient is from: Home  Anticipated d/c is to: TBD Anticipated d/c date is: 09/15/23  Patient currently: Pending improved respiratory status, pain-control  Consults called: None  Admission status: inpatient     Evalene GORMAN Sprinkles, MD [facility]  09/13/2023, 5:15 AM

## 2023-09-14 DIAGNOSIS — J441 Chronic obstructive pulmonary disease with (acute) exacerbation: Secondary | ICD-10-CM | POA: Diagnosis not present

## 2023-09-14 LAB — CBC
HCT: 36 % — ABNORMAL LOW (ref 39.0–52.0)
Hemoglobin: 11.9 g/dL — ABNORMAL LOW (ref 13.0–17.0)
MCH: 34.7 pg — ABNORMAL HIGH (ref 26.0–34.0)
MCHC: 33.1 g/dL (ref 30.0–36.0)
MCV: 105 fL — ABNORMAL HIGH (ref 80.0–100.0)
Platelets: 124 K/uL — ABNORMAL LOW (ref 150–400)
RBC: 3.43 MIL/uL — ABNORMAL LOW (ref 4.22–5.81)
RDW: 16.6 % — ABNORMAL HIGH (ref 11.5–15.5)
WBC: 9.8 K/uL (ref 4.0–10.5)
nRBC: 0 % (ref 0.0–0.2)

## 2023-09-14 LAB — BASIC METABOLIC PANEL WITH GFR
Anion gap: 11 (ref 5–15)
BUN: 14 mg/dL (ref 8–23)
CO2: 26 mmol/L (ref 22–32)
Calcium: 9.3 mg/dL (ref 8.9–10.3)
Chloride: 96 mmol/L — ABNORMAL LOW (ref 98–111)
Creatinine, Ser: 0.59 mg/dL — ABNORMAL LOW (ref 0.61–1.24)
GFR, Estimated: >60 mL/min (ref >60)
Glucose, Bld: 153 mg/dL — ABNORMAL HIGH (ref 70–99)
Potassium: 4.4 mmol/L (ref 3.5–5.1)
Sodium: 134 mmol/L — ABNORMAL LOW (ref 135–145)

## 2023-09-14 MED ORDER — POLYETHYLENE GLYCOL 3350 17 G PO PACK: 17.0000 g | PACK | Freq: Every day | ORAL | Status: AC | PRN

## 2023-09-14 MED ORDER — OXYCODONE HCL 5 MG PO TABS
5.0000 mg | ORAL_TABLET | ORAL | 0 refills | Status: AC | PRN
Start: 2023-09-14 — End: ?

## 2023-09-14 MED ORDER — PREDNISONE 10 MG PO TABS: ORAL_TABLET | ORAL | 0 refills | Status: AC

## 2023-09-14 MED ORDER — LEVOFLOXACIN 500 MG PO TABS: 500.0000 mg | ORAL_TABLET | Freq: Every day | ORAL | 0 refills | Status: AC

## 2023-09-14 MED ORDER — DOCUSATE SODIUM 100 MG PO CAPS: 100.0000 mg | ORAL_CAPSULE | Freq: Every day | ORAL | Status: AC | PRN

## 2023-09-14 MED ORDER — SENNOSIDES-DOCUSATE SODIUM 8.6-50 MG PO TABS: 2.0000 | ORAL_TABLET | Freq: Every evening | ORAL | Status: AC | PRN

## 2023-09-14 NOTE — Plan of Care (Signed)
  Problem: Education: Goal: Knowledge of General Education information will improve Description: Including pain rating scale, medication(s)/side effects and non-pharmacologic comfort measures Outcome: Progressing   Problem: Health Behavior/Discharge Planning: Goal: Ability to manage health-related needs will improve Outcome: Progressing   Problem: Clinical Measurements: Goal: Ability to maintain clinical measurements within normal limits will improve Outcome: Progressing Goal: Will remain free from infection Outcome: Progressing Goal: Diagnostic test results will improve Outcome: Progressing Goal: Respiratory complications will improve Outcome: Progressing Goal: Cardiovascular complication will be avoided Outcome: Progressing   Problem: Activity: Goal: Risk for activity intolerance will decrease Outcome: Progressing   Problem: Nutrition: Goal: Adequate nutrition will be maintained Outcome: Progressing   Problem: Coping: Goal: Level of anxiety will decrease Outcome: Progressing   Problem: Elimination: Goal: Will not experience complications related to bowel motility Outcome: Progressing   Problem: Pain Managment: Goal: General experience of comfort will improve and/or be controlled Outcome: Progressing   Problem: Safety: Goal: Ability to remain free from injury will improve Outcome: Progressing

## 2023-09-14 NOTE — Care Management CC44 (Signed)
 Condition Code 44 Documentation Completed  Patient Details  Name: [name] [name] MRN: [id number] Date of Birth: March 25, 1945   Condition Code 44 given:  Yes Patient signature on Condition Code 44 notice:  Yes Documentation of 2 MD's agreement:  Yes Code 44 added to claim:  Yes    Kindal Ponti M Kowen Kluth, LCSW 09/14/2023, 3:03 PM

## 2023-09-14 NOTE — Discharge Summary (Signed)
 Physician Discharge Summary  Patient ID: [name] [name] MRN: [id number] DOB/AGE: [birth year] 77 y.o.  Admit date: 09/12/2023 Discharge date: 09/14/2023  Admission Diagnoses:  Discharge Diagnoses:  Principal Problem:   COPD exacerbation (HCC) Active Problems:   Essential hypertension   Hyperlipidemia   Acute on chronic respiratory failure with hypoxia (HCC)   Generalized anxiety disorder   Rheumatoid arthritis (HCC)   Chronic diastolic CHF (congestive heart failure) (HCC)   Acute pain of right wrist   Discharged Condition: {condition:18240}  Hospital Course: ***  Consults: {consultation:18241}  Significant Diagnostic Studies: {diagnostics:18242}  Treatments: {Tx:18249}  Discharge Exam: Blood pressure (!) 160/74, pulse 85, temperature 99 F (37.2 C), temperature source Oral, resp. rate 18, height 5' 11 (1.803 m), weight 86.9 kg, SpO2 97%. {physical zkjf:6958869}  Disposition: Discharge disposition: 01-Home or Self Care       Discharge Instructions     Diet - low sodium heart healthy   Complete by: As directed    Increase activity slowly   Complete by: As directed       Allergies as of 09/14/2023       Reactions   Fluticasone -salmeterol Shortness Of Breath, Other (See Comments)   Worsening shortness of breath..        Medication List     STOP taking these medications    albuterol  108 (90 Base) MCG/ACT inhaler Commonly known as: VENTOLIN  HFA   cyanocobalamin  1000 MCG tablet Commonly known as: VITAMIN B12   multivitamin with minerals tablet   naproxen 500 MG tablet Commonly known as: NAPROSYN   ondansetron  4 MG tablet Commonly known as: ZOFRAN        TAKE these medications    ALPRAZolam  0.25 MG tablet Commonly known as: XANAX  Take 1 tablet (0.25 mg total) by mouth 3 (three) times daily as needed for anxiety.   amLODipine  5 MG tablet Commonly known as: NORVASC  Take 5 mg by mouth daily.   atorvastatin  20 MG tablet Commonly known  as: LIPITOR Take 20 mg by mouth daily.   Breztri  Aerosphere 160-9-4.8 MCG/ACT Aero inhaler Generic drug: budesonide -glycopyrrolate -formoterol  Inhale 2 puffs into the lungs in the morning and at bedtime.   docusate sodium  100 MG capsule Commonly known as: COLACE Take 1 capsule (100 mg total) by mouth daily as needed for mild constipation.   folic acid  1 MG tablet Commonly known as: FOLVITE  Take 1 mg by mouth daily.   furosemide  40 MG tablet Commonly known as: LASIX  Take 40 mg by mouth in the morning.   ipratropium-albuterol  0.5-2.5 (3) MG/3ML Soln Commonly known as: DUONEB Take 3 mLs by nebulization 3 (three) times daily. What changed: when to take this   levofloxacin  500 MG tablet Commonly known as: LEVAQUIN  Take 1 tablet (500 mg total) by mouth daily for 5 days.   methocarbamol  500 MG tablet Commonly known as: ROBAXIN  Take 1 tablet (500 mg total) by mouth every 8 (eight) hours as needed for muscle spasms.   methotrexate  50 MG/2ML injection Inject 20 mg into the skin every Friday.   omeprazole 20 MG capsule Commonly known as: PRILOSEC Take 20 mg by mouth daily.   oxyCODONE  5 MG immediate release tablet Commonly known as: Oxy IR/ROXICODONE  Take 1 tablet (5 mg total) by mouth every 4 (four) hours as needed for moderate pain (pain score 4-6).   OXYGEN  Inhale 4 L/min into the lungs continuous.   polyethylene glycol 17 g packet Commonly known as: MIRALAX  / GLYCOLAX  Take 17 g by mouth daily as needed for mild  constipation.   predniSONE  10 MG tablet Commonly known as: DELTASONE  Prednisone  40 mg p.o. once daily for 3 days, then 30 mg p.o. once daily for 3 days, then 20 mg p.o. once daily for 3 days and then 10 mg p.o. once daily for 3 days and stop. Start taking on: September 15, 2023   senna-docusate 8.6-50 MG tablet Commonly known as: Senokot-S Take 2 tablets by mouth at bedtime as needed for mild constipation.   sertraline  100 MG tablet Commonly known as:  ZOLOFT  Take 100 mg by mouth daily.   TYLENOL  500 MG tablet Generic drug: acetaminophen  Take 500 mg by mouth every 8 (eight) hours as needed for mild pain (pain score 1-3) or headache.         SignedBETHA Leatrice LILLETTE Rosario 09/14/2023, 4:08 PM

## 2023-09-17 DIAGNOSIS — M0579 Rheumatoid arthritis with rheumatoid factor of multiple sites without organ or systems involvement: Secondary | ICD-10-CM | POA: Diagnosis not present

## 2023-09-17 DIAGNOSIS — R0602 Shortness of breath: Secondary | ICD-10-CM | POA: Diagnosis not present

## 2023-09-17 DIAGNOSIS — E663 Overweight: Secondary | ICD-10-CM | POA: Diagnosis not present

## 2023-09-17 DIAGNOSIS — M79642 Pain in left hand: Secondary | ICD-10-CM | POA: Diagnosis not present

## 2023-09-17 DIAGNOSIS — M79641 Pain in right hand: Secondary | ICD-10-CM | POA: Diagnosis not present

## 2023-09-17 DIAGNOSIS — Z6826 Body mass index (BMI) 26.0-26.9, adult: Secondary | ICD-10-CM | POA: Diagnosis not present

## 2023-09-17 DIAGNOSIS — I441 Atrioventricular block, second degree: Secondary | ICD-10-CM | POA: Diagnosis not present

## 2023-09-17 DIAGNOSIS — M1991 Primary osteoarthritis, unspecified site: Secondary | ICD-10-CM | POA: Diagnosis not present

## 2023-09-17 DIAGNOSIS — R5382 Chronic fatigue, unspecified: Secondary | ICD-10-CM | POA: Diagnosis not present

## 2023-09-27 DIAGNOSIS — I5033 Acute on chronic diastolic (congestive) heart failure: Secondary | ICD-10-CM | POA: Diagnosis not present

## 2023-09-27 DIAGNOSIS — I739 Peripheral vascular disease, unspecified: Secondary | ICD-10-CM | POA: Diagnosis not present

## 2023-09-27 DIAGNOSIS — E7800 Pure hypercholesterolemia, unspecified: Secondary | ICD-10-CM | POA: Diagnosis not present

## 2023-09-27 DIAGNOSIS — M069 Rheumatoid arthritis, unspecified: Secondary | ICD-10-CM | POA: Diagnosis not present

## 2023-09-27 DIAGNOSIS — K219 Gastro-esophageal reflux disease without esophagitis: Secondary | ICD-10-CM | POA: Diagnosis not present

## 2023-09-27 DIAGNOSIS — F411 Generalized anxiety disorder: Secondary | ICD-10-CM | POA: Diagnosis not present

## 2023-09-27 DIAGNOSIS — F1020 Alcohol dependence, uncomplicated: Secondary | ICD-10-CM | POA: Diagnosis not present

## 2023-09-27 DIAGNOSIS — I2510 Atherosclerotic heart disease of native coronary artery without angina pectoris: Secondary | ICD-10-CM | POA: Diagnosis not present

## 2023-09-27 DIAGNOSIS — J9611 Chronic respiratory failure with hypoxia: Secondary | ICD-10-CM | POA: Diagnosis not present

## 2023-09-27 DIAGNOSIS — I110 Hypertensive heart disease with heart failure: Secondary | ICD-10-CM | POA: Diagnosis not present

## 2023-09-27 DIAGNOSIS — J439 Emphysema, unspecified: Secondary | ICD-10-CM | POA: Diagnosis not present

## 2023-10-02 DIAGNOSIS — J439 Emphysema, unspecified: Secondary | ICD-10-CM | POA: Diagnosis not present

## 2023-10-02 DIAGNOSIS — J9611 Chronic respiratory failure with hypoxia: Secondary | ICD-10-CM | POA: Diagnosis not present

## 2023-10-12 ENCOUNTER — Other Ambulatory Visit: Payer: Self-pay | Admitting: Internal Medicine

## 2023-10-12 DIAGNOSIS — J449 Chronic obstructive pulmonary disease, unspecified: Secondary | ICD-10-CM | POA: Diagnosis not present

## 2023-10-24 ENCOUNTER — Encounter (HOSPITAL_BASED_OUTPATIENT_CLINIC_OR_DEPARTMENT_OTHER): Payer: Self-pay | Admitting: Pulmonary Disease

## 2023-10-24 ENCOUNTER — Ambulatory Visit (INDEPENDENT_AMBULATORY_CARE_PROVIDER_SITE_OTHER): Admitting: Pulmonary Disease

## 2023-10-24 VITALS — BP 135/66 | HR 87 | Ht 71.0 in | Wt 184.4 lb

## 2023-10-24 DIAGNOSIS — Z23 Encounter for immunization: Secondary | ICD-10-CM

## 2023-10-24 DIAGNOSIS — J439 Emphysema, unspecified: Secondary | ICD-10-CM | POA: Diagnosis not present

## 2023-10-24 DIAGNOSIS — J449 Chronic obstructive pulmonary disease, unspecified: Secondary | ICD-10-CM | POA: Diagnosis not present

## 2023-10-24 DIAGNOSIS — M069 Rheumatoid arthritis, unspecified: Secondary | ICD-10-CM

## 2023-10-24 DIAGNOSIS — J9611 Chronic respiratory failure with hypoxia: Secondary | ICD-10-CM

## 2023-10-24 NOTE — Patient Instructions (Addendum)
 X flu shot   VISIT SUMMARY: Today, we reviewed your recent health challenges, including your COPD and rheumatoid arthritis. We discussed your recent hospitalizations, current treatments, and improvements in your endurance and breathing. We also talked about your medication regimen and potential adjustments to better manage your conditions.  YOUR PLAN: -CHRONIC OBSTRUCTIVE PULMONARY DISEASE (COPD) WITH CHRONIC RESPIRATORY FAILURE AND HYPOXIA: COPD is a chronic lung disease that makes it hard to breathe and can lead to respiratory failure and low oxygen  levels. You have had multiple hospitalizations this year due to COPD exacerbations and pneumonia. To help manage your condition, we administered a flu shot today, increased your oxygen  to 5 liters during exertion to prevent low oxygen  levels, and discussed the possibility of injections for COPD if your condition worsens.  -RHEUMATOID ARTHRITIS: Rheumatoid arthritis is an autoimmune disease that causes joint pain and inflammation. You have been managing flare-ups with prednisone  and are currently on methotrexate  injections. We discussed concerns about methotrexate  increasing your risk of infections. You should continue your methotrexate  injections as prescribed and use prednisone  during flare-ups as needed. You have an upcoming appointment with your rheumatologist to discuss alternative treatments.  INSTRUCTIONS: Please follow up with your rheumatologist as scheduled to discuss alternative treatments for your rheumatoid arthritis. Continue your current medication regimen and increase your oxygen  to 5 liters during exertion. If you experience any worsening of your symptoms or have any concerns, please contact our office.                      Contains text generated by [employer].                                 Contains text generated by [employer]bridge.

## 2023-10-24 NOTE — Addendum Note (Signed)
 Addended by: TRUDY WARREN CROME on: 10/24/2023 03:46 PM   Modules accepted: Orders

## 2023-10-24 NOTE — Progress Notes (Signed)
 Subjective:    Patient ID: [name] [name], male    DOB: April 13, 1945, 78 y.o.   MRN: [id number]   78yo heavy ex-smoker  For FU of  COPD & chronic resp failure with hypoxia He smoked about a pack per day  until he quit in 2010- about 40 pack years. -on portable oxygen  03/2019 -uses 2 L continuous at home and  POC 3 to 4 L when he goes out -completed pulmonary rehab 2020   PMH : new diagnosis rheumatoid arthritis. Started on methotrexate  15mg  once a week    03/2023 hospitalization  x 2 >>The first was due to a fall resulting in rib fractures, which caused pain and shortness of breath. The patient was initially on five liters of oxygen  but was weaned down to three liters. The patient was readmitted within a week due to fluid buildup and required transient BiPAP. He was diuresed about nine liters and his BNP level decreased from 136 to 99.  06/2023 >> hosp x2 for hypoxia & multifocal pna -stenotrophomonas 09/2023 hosp for copd exac  Discussed the use of AI scribe software for clinical note transcription with the patient, who gave verbal consent to proceed.  History of Present Illness [name] [name] is a 78 year old male with severe COPD and chronic respiratory failure who presents for follow-up after multiple hospitalizations for COPD exacerbations.  He has been hospitalized five times this year for COPD exacerbations, with admissions in March, twice in June, and the last in September. These exacerbations were complicated by multifocal pneumonia. He uses four liters of oxygen  at rest and increases to four liters during exertion, sometimes increasing to five liters if needed. He is currently receiving home health visits and physical therapy to improve endurance, noting improvement in breathing and walking.  He is on methotrexate  injections once a week for rheumatoid arthritis, with a current dose of 20 mg. He experiences flare-ups of arthritis pain, which he manages with prednisone  during  exacerbations. He recently had a flare-up starting last Sunday, for which he took 20 mg of prednisone  daily for two days, reducing to 10 mg today.  In March, he suffered a fall that resulted in a rib fracture, contributing to breathing difficulties. In June, he experienced pneumonia that was initially not detected on a CT scan but later confirmed during hospitalization. A specific pathogen was identified in his lungs, requiring targeted antibiotic treatment.  He has a history of being in a rehabilitation facility where he experienced inadequate oxygen  management, leading to a return to the hospital. He is now back home and working on building endurance through exercise, noting that he can now reach his car without significant difficulty. He reports improvement in breathing and walking, and notes that he is doing a lot better than before. He is able to recover quickly from exertion, typically within two minutes.     Significant tests/ events reviewed   07/2020 ONO /RA -desaturation less than 88% for 23 minutes      LDCT in September 2024  showed lung RADS 2; Pulmonary nodules appear stable measuring 4.31mm or less. centrilobular and paraseptal emphysema.   HRCT chest 06/2022 no ILD CTA 06/2021 >> Upper lobe predominant emphysema and Scarring.Multiple small calcified and noncalcified pulmonary nodules are unchanged     LDCT 07/05/20 showed lung RADS 2. Centrilobular and paraseptal emphysema , unchanged nodules compared to 2020   PFTs 09/2012 moderate reversible airway obstruction, ratio 49, FEV1 44%, improved to 56%/25% bronchodilator response, TLC 88%,  DLCO 55%   Spirometry 03/18/2018 showed ratio of 61, FEV1 of 43% and FVC of 52%  Review of Systems  neg for any significant sore throat, dysphagia, itching, sneezing, nasal congestion or excess/ purulent secretions, fever, chills, sweats, unintended wt loss, pleuritic or exertional cp, hempoptysis, orthopnea pnd or change in chronic leg swelling. Also  denies presyncope, palpitations, heartburn, abdominal pain, nausea, vomiting, diarrhea or change in bowel or urinary habits, dysuria,hematuria, rash, arthralgias, visual complaints, headache, numbness weakness or ataxia.      Objective:   Physical Exam  Gen. Pleasant, well-nourished, in no distress ENT - no thrush, no pallor/icterus,no post nasal drip, on 3L [place] Neck: No JVD, no thyromegaly, no carotid bruits Lungs: no use of accessory muscles, no dullness to percussion, clear without rales or rhonchi  Cardiovascular: Rhythm regular, heart sounds  normal, no murmurs or gallops, no peripheral edema Musculoskeletal: No deformities, no cyanosis or clubbing , skin bruising+       Assessment & Plan:   Assessment and Plan Assessment & Plan Chronic obstructive pulmonary disease with chronic respiratory failure and hypoxia Severe COPD with chronic respiratory failure and hypoxia, with multiple hospitalizations this year for exacerbations and pneumonia. Currently on 3-4 liters of oxygen . Recent improvement in endurance and breathing with physical therapy. Concerns about methotrexate  contributing to hospitalizations due to immunosuppression. - Administer flu shot today. - Increase oxygen  to 5 liters during exertion to prevent desaturation. - Consider injections for COPD if condition worsens.  Rheumatoid arthritis Rheumatoid arthritis with recent flare-ups managed with prednisone . Currently on methotrexate  injections, with concerns about immunosuppression leading to increased risk of infections. Alternative treatments discussed with rheumatologist, including potential for other medications or injections. Upcoming appointment with rheumatology to discuss further options. - Continue methotrexate  injections as prescribed. - Use prednisone  during flare-ups as needed. - Discuss alternative treatments with rheumatologist at upcoming appointment.

## 2023-11-01 DIAGNOSIS — J439 Emphysema, unspecified: Secondary | ICD-10-CM | POA: Diagnosis not present

## 2023-11-01 DIAGNOSIS — J9611 Chronic respiratory failure with hypoxia: Secondary | ICD-10-CM | POA: Diagnosis not present

## 2023-11-04 ENCOUNTER — Telehealth: Payer: Self-pay

## 2023-11-04 NOTE — Telephone Encounter (Signed)
 Called pt left message I could not find any reason for anyone to call him to reach out if he needed us  Copied from CRM #[id number]. Topic: General - Call Back - No Documentation >> Nov 04, 2023  2:14 PM Devaughn RAMAN wrote: Reason for CRM: Patient stated he received a voicemail from [facility] regarding an order for a CPAP machine. Contacted [facility], no answer. Please f/u with pt regarding this as there is no note in regards to this

## 2023-11-12 DIAGNOSIS — M0579 Rheumatoid arthritis with rheumatoid factor of multiple sites without organ or systems involvement: Secondary | ICD-10-CM | POA: Diagnosis not present

## 2023-11-12 DIAGNOSIS — R0602 Shortness of breath: Secondary | ICD-10-CM | POA: Diagnosis not present

## 2023-11-12 DIAGNOSIS — M1991 Primary osteoarthritis, unspecified site: Secondary | ICD-10-CM | POA: Diagnosis not present

## 2023-11-12 DIAGNOSIS — Z6826 Body mass index (BMI) 26.0-26.9, adult: Secondary | ICD-10-CM | POA: Diagnosis not present

## 2023-11-12 DIAGNOSIS — R5382 Chronic fatigue, unspecified: Secondary | ICD-10-CM | POA: Diagnosis not present

## 2023-11-12 DIAGNOSIS — E663 Overweight: Secondary | ICD-10-CM | POA: Diagnosis not present

## 2023-11-13 DIAGNOSIS — R7301 Impaired fasting glucose: Secondary | ICD-10-CM | POA: Diagnosis not present

## 2023-11-13 DIAGNOSIS — Z23 Encounter for immunization: Secondary | ICD-10-CM | POA: Diagnosis not present

## 2023-11-13 DIAGNOSIS — Z79899 Other long term (current) drug therapy: Secondary | ICD-10-CM | POA: Diagnosis not present

## 2023-11-13 DIAGNOSIS — I739 Peripheral vascular disease, unspecified: Secondary | ICD-10-CM | POA: Diagnosis not present

## 2023-11-13 DIAGNOSIS — Z0001 Encounter for general adult medical examination with abnormal findings: Secondary | ICD-10-CM | POA: Diagnosis not present

## 2023-11-13 DIAGNOSIS — F1020 Alcohol dependence, uncomplicated: Secondary | ICD-10-CM | POA: Diagnosis not present

## 2023-11-13 DIAGNOSIS — J9611 Chronic respiratory failure with hypoxia: Secondary | ICD-10-CM | POA: Diagnosis not present

## 2023-11-13 DIAGNOSIS — E7800 Pure hypercholesterolemia, unspecified: Secondary | ICD-10-CM | POA: Diagnosis not present

## 2023-11-13 DIAGNOSIS — K219 Gastro-esophageal reflux disease without esophagitis: Secondary | ICD-10-CM | POA: Diagnosis not present

## 2023-11-13 DIAGNOSIS — M069 Rheumatoid arthritis, unspecified: Secondary | ICD-10-CM | POA: Diagnosis not present

## 2023-11-13 DIAGNOSIS — F411 Generalized anxiety disorder: Secondary | ICD-10-CM | POA: Diagnosis not present

## 2023-12-02 DIAGNOSIS — J9611 Chronic respiratory failure with hypoxia: Secondary | ICD-10-CM | POA: Diagnosis not present

## 2023-12-02 DIAGNOSIS — J439 Emphysema, unspecified: Secondary | ICD-10-CM | POA: Diagnosis not present

## 2024-01-20 ENCOUNTER — Ambulatory Visit (HOSPITAL_BASED_OUTPATIENT_CLINIC_OR_DEPARTMENT_OTHER)

## 2024-01-20 ENCOUNTER — Encounter (HOSPITAL_BASED_OUTPATIENT_CLINIC_OR_DEPARTMENT_OTHER): Payer: Self-pay

## 2024-01-20 VITALS — BP 130/62 | HR 86 | Ht 71.0 in | Wt 187.0 lb

## 2024-01-20 DIAGNOSIS — J9611 Chronic respiratory failure with hypoxia: Secondary | ICD-10-CM | POA: Diagnosis not present

## 2024-01-20 DIAGNOSIS — Z87891 Personal history of nicotine dependence: Secondary | ICD-10-CM

## 2024-01-20 DIAGNOSIS — J4489 Other specified chronic obstructive pulmonary disease: Secondary | ICD-10-CM

## 2024-01-20 DIAGNOSIS — J439 Emphysema, unspecified: Secondary | ICD-10-CM | POA: Diagnosis not present

## 2024-01-20 NOTE — Patient Instructions (Addendum)
 Check CBC and IgE levels; lab work order placed for today.   Follow up with Dr. Jude in 3 months.  Continue Breztri  twice daily.  Continue Albuterol  as needed.  Start Claritin.

## 2024-01-20 NOTE — Progress Notes (Signed)
 "  @Patient  ID: [name] [name], male    DOB: 1945-02-18, 79 y.o.   MRN: [id number]  No chief complaint on file.   Referring provider: Regino Slater, MD  HPI: Discussed the use of AI scribe software for clinical note transcription with the patient, who gave verbal consent to proceed.  History of Present Illness [name] [name] is a 79 year old male with rheumatoid arthritis and COPD who presents for a three-month follow-up visit.  He was taken off methotrexate  injections due to concerns about immune suppression, leading to a return of arthritis symptoms. He is currently managing with naproxen twice daily. Although not in a flare-up state, he describes the arthritis as painful.  He follows closely with Rheum for this, and the goal was to reduce the amount of infections he has.  For about a month, he has experienced symptoms he describes as 'the crud', including a loose cough and increased nasal discharge. He has been using cough medicine and decongestants with some relief. He continues to wear oxygen  daily.  No fever, chills, or significant sputum production, though he occasionally coughs up yellowish sputum.  He has increased his use of the albuterol  inhaler to twice daily, whereas previously it was once every two days or less. He also uses the Breztri  inhaler two puffs twice a day and a nebulizer approximately once every three days.   He has a history of three hospitalizations last year, two for respiratory issues including pneumonia and one for a severe arthritis flare-up. He mentions that arthritis flare-ups can be 'almost unbearable' and affect his breathing.  He has been taking Benadryl for nasal discharge and a rash, which he describes as possibly a heat rash or related to oxygen  use. Benadryl helps with the rash but causes drowsiness, so he has not taken it today. He has tried Claritin and Zyrtec in the past for similar symptoms, but has not tried them for this episode of nasal  congestion.  Last OV 10/24/2023: 78yo heavy ex-smoker  For FU of  COPD & chronic resp failure with hypoxia He smoked about a pack per day  until he quit in 2010- about 40 pack years. -on portable oxygen  03/2019 -uses 2 L continuous at home and  POC 3 to 4 L when he goes out -completed pulmonary rehab 2020   PMH : new diagnosis rheumatoid arthritis. Started on methotrexate  15mg  once a week    03/2023 hospitalization  x 2 >>The first was due to a fall resulting in rib fractures, which caused pain and shortness of breath. The patient was initially on five liters of oxygen  but was weaned down to three liters. The patient was readmitted within a week due to fluid buildup and required transient BiPAP. He was diuresed about nine liters and his BNP level decreased from 136 to 99.  06/2023 >> hosp x2 for hypoxia & multifocal pna -stenotrophomonas 09/2023 hosp for copd exac  [name] [name] is a 79 year old male with severe COPD and chronic respiratory failure who presents for follow-up after multiple hospitalizations for COPD exacerbations.   He has been hospitalized five times this year for COPD exacerbations, with admissions in March, twice in June, and the last in September. These exacerbations were complicated by multifocal pneumonia. He uses four liters of oxygen  at rest and increases to four liters during exertion, sometimes increasing to five liters if needed. He is currently receiving home health visits and physical therapy to improve endurance, noting improvement in breathing and  walking.   He is on methotrexate  injections once a week for rheumatoid arthritis, with a current dose of 20 mg. He experiences flare-ups of arthritis pain, which he manages with prednisone  during exacerbations. He recently had a flare-up starting last Sunday, for which he took 20 mg of prednisone  daily for two days, reducing to 10 mg today.   In March, he suffered a fall that resulted in a rib fracture, contributing to  breathing difficulties. In June, he experienced pneumonia that was initially not detected on a CT scan but later confirmed during hospitalization. A specific pathogen was identified in his lungs, requiring targeted antibiotic treatment.   He has a history of being in a rehabilitation facility where he experienced inadequate oxygen  management, leading to a return to the hospital. He is now back home and working on building endurance through exercise, noting that he can now reach his car without significant difficulty. He reports improvement in breathing and walking, and notes that he is doing a lot better than before. He is able to recover quickly from exertion, typically within two minutes.    Significant tests/ events reviewed   07/2020 ONO /RA -desaturation less than 88% for 23 minutes      LDCT in September 2024  showed lung RADS 2; Pulmonary nodules appear stable measuring 4.18mm or less. centrilobular and paraseptal emphysema.   HRCT chest 06/2022 no ILD CTA 06/2021 >> Upper lobe predominant emphysema and Scarring.Multiple small calcified and noncalcified pulmonary nodules are unchanged     LDCT 07/05/20 showed lung RADS 2. Centrilobular and paraseptal emphysema , unchanged nodules compared to 2020   PFTs 09/2012 moderate reversible airway obstruction, ratio 49, FEV1 44%, improved to 56%/25% bronchodilator response, TLC 88%, DLCO 55%   Spirometry 03/18/2018 showed ratio of 61, FEV1 of 43% and FVC of 52%  Allergies[1]  Immunization History  Administered Date(s) Administered   Fluad Quad(high Dose 65+) 10/04/2021   INFLUENZA, HIGH DOSE SEASONAL PF 10/24/2023   Influenza Split 01/14/2012, 11/26/2012, 10/11/2020   Influenza,inj,Quad PF,6+ Mos 09/30/2015, 10/05/2016, 10/23/2017, 11/05/2018, 11/26/2019   Influenza,inj,Quad PF,6-35 Mos 11/09/2018   Influenza,inj,quad, With Preservative 09/18/2013, 09/27/2014   PFIZER(Purple Top)SARS-COV-2 Vaccination 02/13/2019, 03/06/2019, 03/23/2019,  04/23/2019   Pneumococcal Conjugate-13 09/18/2013   Pneumococcal Polysaccharide-23 09/27/2014   Tdap 10/24/2010   Zoster, Live 10/24/2010    Past Medical History:  Diagnosis Date   COPD (chronic obstructive pulmonary disease) (HCC)    Generalized anxiety disorder 07/07/2022   High cholesterol    Hypertension    Rheumatoid arthritis (HCC) 11/12/2022    Tobacco History: Tobacco Use History[2] Counseling given: Not Answered   Outpatient Medications Prior to Visit  Medication Sig Dispense Refill   ALPRAZolam  (XANAX ) 0.25 MG tablet Take 1 tablet (0.25 mg total) by mouth 3 (three) times daily as needed for anxiety. 20 tablet 0   amLODipine  (NORVASC ) 5 MG tablet Take 5 mg by mouth daily.     atorvastatin  (LIPITOR) 20 MG tablet Take 20 mg by mouth daily.     budesonide -glycopyrrolate -formoterol  (BREZTRI  AEROSPHERE) 160-9-4.8 MCG/ACT AERO inhaler Inhale 2 puffs into the lungs in the morning and at bedtime. 10.7 g 8   docusate sodium  (COLACE) 100 MG capsule Take 1 capsule (100 mg total) by mouth daily as needed for mild constipation.     folic acid  (FOLVITE ) 1 MG tablet Take 1 mg by mouth daily.     furosemide  (LASIX ) 40 MG tablet Take 40 mg by mouth in the morning.     ipratropium-albuterol  (DUONEB) 0.5-2.5 (  3) MG/3ML SOLN Take 3 mLs by nebulization 3 (three) times daily.     methocarbamol  (ROBAXIN ) 500 MG tablet Take 1 tablet (500 mg total) by mouth every 8 (eight) hours as needed for muscle spasms. 14 tablet 0   omeprazole (PRILOSEC) 20 MG capsule Take 20 mg by mouth daily.     oxyCODONE  (OXY IR/ROXICODONE ) 5 MG immediate release tablet Take 1 tablet (5 mg total) by mouth every 4 (four) hours as needed for moderate pain (pain score 4-6). 30 tablet 0   OXYGEN  Inhale 4 L/min into the lungs continuous.     polyethylene glycol (MIRALAX  / GLYCOLAX ) 17 g packet Take 17 g by mouth daily as needed for mild constipation.     predniSONE  (DELTASONE ) 10 MG tablet Prednisone  40 mg p.o. once daily for  3 days, then 30 mg p.o. once daily for 3 days, then 20 mg p.o. once daily for 3 days and then 10 mg p.o. once daily for 3 days and stop. 30 tablet 0   senna-docusate (SENOKOT-S) 8.6-50 MG tablet Take 2 tablets by mouth at bedtime as needed for mild constipation.     sertraline  (ZOLOFT ) 100 MG tablet Take 100 mg by mouth daily.     TYLENOL  500 MG tablet Take 500 mg by mouth every 8 (eight) hours as needed for mild pain (pain score 1-3) or headache.     methotrexate  50 MG/2ML injection Inject 20 mg into the skin every Friday.     No facility-administered medications prior to visit.     Review of Systems: as per hpi  Constitutional:   No  weight loss, night sweats,  Fevers, chills, fatigue, or  lassitude.  HEENT:   No headaches,  Difficulty swallowing,  Tooth/dental problems, or  Sore throat,                No sneezing, itching, ear ache, nasal congestion, post nasal drip,   CV:  No chest pain,  Orthopnea, PND, swelling in lower extremities, anasarca, dizziness, palpitations, syncope.   GI  No heartburn, indigestion, abdominal pain, nausea, vomiting, diarrhea, change in bowel habits, loss of appetite, bloody stools.   Resp: No shortness of breath with exertion or at rest.  No excess mucus, no productive cough,  No non-productive cough,  No coughing up of blood.  No change in color of mucus.  No wheezing.  No chest wall deformity  Skin: no rash or lesions.  GU: no dysuria, change in color of urine, no urgency or frequency.  No flank pain, no hematuria   MS:  No joint pain or swelling.  No decreased range of motion.  No back pain.    Physical Exam  BP 130/62   Pulse 86   Ht 5' 11 (1.803 m)   Wt 187 lb (84.8 kg)   SpO2 95% Comment: 3 LITERS  BMI 26.08 kg/m   GEN: A/Ox3; pleasant , NAD, well nourished    HEENT:  [place]/AT,  EACs-clear, TMs-wnl, NOSE-clear, THROAT-clear, no lesions, no postnasal drip or exudate noted.   NECK:  Supple w/ fair ROM; no JVD; normal carotid impulses w/o  bruits; no thyromegaly or nodules palpated; no lymphadenopathy.    RESP  Clear  P & A; w/o, wheezes/ rales/ or rhonchi. no accessory muscle use, no dullness to percussion  CARD:  RRR, no m/r/g, no peripheral edema, pulses intact, no cyanosis or clubbing.  GI:   Soft & nt; nml bowel sounds; no organomegaly or masses detected.   Musco:  Warm bil, no deformities or joint swelling noted.   Neuro: alert, no focal deficits noted.    Skin: Warm, no lesions or rashes    Lab Results:  CBC    Component Value Date/Time   WBC 9.8 09/14/2023 0437   RBC 3.43 (L) 09/14/2023 0437   HGB 11.9 (L) 09/14/2023 0437   HCT 36.0 (L) 09/14/2023 0437   PLT 124 (L) 09/14/2023 0437   MCV 105.0 (H) 09/14/2023 0437   MCH 34.7 (H) 09/14/2023 0437   MCHC 33.1 09/14/2023 0437   RDW 16.6 (H) 09/14/2023 0437   LYMPHSABS 0.4 (L) 07/05/2023 1455   MONOABS 0.2 07/05/2023 1455   EOSABS 0.0 07/05/2023 1455   BASOSABS 0.0 07/05/2023 1455    BMET    Component Value Date/Time   NA 134 (L) 09/14/2023 0437   NA 133 (L) 08/28/2019 1056   K 4.4 09/14/2023 0437   CL 96 (L) 09/14/2023 0437   CO2 26 09/14/2023 0437   GLUCOSE 153 (H) 09/14/2023 0437   BUN 14 09/14/2023 0437   BUN 11 08/28/2019 1056   CREATININE 0.59 (L) 09/14/2023 0437   CALCIUM  9.3 09/14/2023 0437   GFRNONAA >60 09/14/2023 0437   GFRAA 105 08/28/2019 1056    BNP    Component Value Date/Time   BNP 35.6 06/24/2023 1930    ProBNP    Component Value Date/Time   PROBNP 211.0 09/13/2023 0056   PROBNP 18.0 02/05/2022 1140    Imaging: No results found.  Administration History     None           No data to display          Lab Results  Component Value Date   NITRICOXIDE BLOOD RIGHT HAND 04/02/2023     Assessment & Plan:   Assessment & Plan COPD with chronic bronchitis and emphysema (HCC)  Chronic respiratory failure with hypoxia (HCC)  Former smoker  Assessment and Plan Assessment & Plan COPD with chronic  bronchitis and emphysema Increased albuterol  use, no current exacerbation. Evaluating for biologic therapy due to frequent exacerbations and hospitalizations. - Continue Breztri  inhaler, two puffs twice daily. - Use albuterol  inhaler as needed. - Use DuoNebs nebulizer as needed. - Ordered blood work to assess candidacy for biologic therapy.  Chronic respiratory failure with hypoxia Oxygen  levels dropping quicker, no current exacerbation. - Continue current oxygen  therapy.  Upper respiratory icongestion with postnasal drip Symptoms suggest seasonal allergies, no significant findings on auscultation. - Consider over-the-counter antihistamines like Claritin or Zyrtec for symptom relief. - Avoid Benadryl due to drowsiness issues.    Return in about 3 months (around 04/19/2024) for lab results; f/u with Dr. Jude, COPD.  Candis Dandy, PA-C 01/20/2024      [1]  Allergies Allergen Reactions   Fluticasone -Salmeterol Shortness Of Breath and Other (See Comments)    Worsening shortness of breath..  [2]  Social History Tobacco Use  Smoking Status Former   Current packs/day: 0.00   Average packs/day: 2.0 packs/day for 40.0 years (80.0 ttl pk-yrs)   Types: Cigarettes   Start date: 61   Quit date: 2010   Years since quitting: 16.0   Passive exposure: Never  Smokeless Tobacco Never   "

## 2024-04-13 ENCOUNTER — Ambulatory Visit (HOSPITAL_BASED_OUTPATIENT_CLINIC_OR_DEPARTMENT_OTHER): Admitting: Pulmonary Disease
